# Patient Record
Sex: Female | Born: 1960 | Race: Black or African American | Hispanic: No | Marital: Single | Smoking: Current every day smoker
Health system: Southern US, Community
[De-identification: ages and names within clinical notes are randomized; demographics above are authoritative.]

## PROBLEM LIST (undated history)

## (undated) DIAGNOSIS — M1990 Unspecified osteoarthritis, unspecified site: Secondary | ICD-10-CM

## (undated) DIAGNOSIS — E119 Type 2 diabetes mellitus without complications: Secondary | ICD-10-CM

## (undated) DIAGNOSIS — M545 Low back pain, unspecified: Secondary | ICD-10-CM

## (undated) DIAGNOSIS — G709 Myoneural disorder, unspecified: Secondary | ICD-10-CM

## (undated) DIAGNOSIS — M51369 Other intervertebral disc degeneration, lumbar region without mention of lumbar back pain or lower extremity pain: Secondary | ICD-10-CM

## (undated) DIAGNOSIS — K589 Irritable bowel syndrome without diarrhea: Secondary | ICD-10-CM

## (undated) DIAGNOSIS — D649 Anemia, unspecified: Secondary | ICD-10-CM

## (undated) DIAGNOSIS — J45909 Unspecified asthma, uncomplicated: Secondary | ICD-10-CM

## (undated) DIAGNOSIS — K5190 Ulcerative colitis, unspecified, without complications: Secondary | ICD-10-CM

## (undated) DIAGNOSIS — H409 Unspecified glaucoma: Secondary | ICD-10-CM

## (undated) DIAGNOSIS — E785 Hyperlipidemia, unspecified: Secondary | ICD-10-CM

## (undated) DIAGNOSIS — K219 Gastro-esophageal reflux disease without esophagitis: Secondary | ICD-10-CM

## (undated) DIAGNOSIS — F1021 Alcohol dependence, in remission: Secondary | ICD-10-CM

## (undated) DIAGNOSIS — I10 Essential (primary) hypertension: Secondary | ICD-10-CM

## (undated) DIAGNOSIS — I499 Cardiac arrhythmia, unspecified: Secondary | ICD-10-CM

## (undated) DIAGNOSIS — J449 Chronic obstructive pulmonary disease, unspecified: Secondary | ICD-10-CM

## (undated) DIAGNOSIS — K760 Fatty (change of) liver, not elsewhere classified: Secondary | ICD-10-CM

## (undated) DIAGNOSIS — D219 Benign neoplasm of connective and other soft tissue, unspecified: Secondary | ICD-10-CM

## (undated) DIAGNOSIS — M5136 Other intervertebral disc degeneration, lumbar region: Secondary | ICD-10-CM

## (undated) DIAGNOSIS — N289 Disorder of kidney and ureter, unspecified: Secondary | ICD-10-CM

## (undated) HISTORY — PX: FRACTURE SURGERY: SHX138

---

## 2004-11-03 ENCOUNTER — Ambulatory Visit: Payer: Self-pay | Admitting: Family Medicine

## 2004-11-16 ENCOUNTER — Ambulatory Visit: Payer: Self-pay | Admitting: Family Medicine

## 2005-11-10 ENCOUNTER — Ambulatory Visit: Payer: Self-pay | Admitting: Family Medicine

## 2005-11-13 ENCOUNTER — Emergency Department: Payer: Self-pay | Admitting: General Practice

## 2006-01-04 HISTORY — PX: TOTAL KNEE ARTHROPLASTY: SHX125

## 2006-03-10 ENCOUNTER — Ambulatory Visit: Payer: Self-pay | Admitting: Physician Assistant

## 2006-04-12 ENCOUNTER — Ambulatory Visit: Payer: Self-pay | Admitting: Unknown Physician Specialty

## 2006-04-12 ENCOUNTER — Other Ambulatory Visit: Payer: Self-pay

## 2006-04-19 ENCOUNTER — Inpatient Hospital Stay: Payer: Self-pay | Admitting: Unknown Physician Specialty

## 2006-04-19 HISTORY — PX: JOINT REPLACEMENT: SHX530

## 2006-11-16 ENCOUNTER — Ambulatory Visit: Payer: Self-pay | Admitting: Family Medicine

## 2006-12-14 ENCOUNTER — Ambulatory Visit: Payer: Self-pay | Admitting: Family Medicine

## 2007-02-23 ENCOUNTER — Emergency Department: Payer: Self-pay | Admitting: Internal Medicine

## 2007-02-23 ENCOUNTER — Other Ambulatory Visit: Payer: Self-pay

## 2007-07-03 ENCOUNTER — Ambulatory Visit: Payer: Self-pay | Admitting: Family Medicine

## 2007-09-12 ENCOUNTER — Ambulatory Visit: Payer: Self-pay | Admitting: Family Medicine

## 2007-12-19 ENCOUNTER — Ambulatory Visit: Payer: Self-pay | Admitting: Certified Nurse Midwife

## 2008-01-12 ENCOUNTER — Emergency Department: Payer: Self-pay | Admitting: Emergency Medicine

## 2008-01-13 ENCOUNTER — Emergency Department: Payer: Self-pay | Admitting: Emergency Medicine

## 2008-07-25 ENCOUNTER — Ambulatory Visit: Payer: Self-pay | Admitting: Internal Medicine

## 2008-11-05 ENCOUNTER — Ambulatory Visit: Payer: Self-pay | Admitting: Internal Medicine

## 2009-02-12 ENCOUNTER — Ambulatory Visit: Payer: Self-pay | Admitting: Family Medicine

## 2009-02-13 ENCOUNTER — Encounter: Payer: Self-pay | Admitting: Unknown Physician Specialty

## 2009-03-04 ENCOUNTER — Encounter: Payer: Self-pay | Admitting: Unknown Physician Specialty

## 2010-01-06 ENCOUNTER — Ambulatory Visit: Payer: Self-pay | Admitting: Gastroenterology

## 2010-02-05 ENCOUNTER — Emergency Department: Payer: Self-pay | Admitting: Emergency Medicine

## 2010-02-12 ENCOUNTER — Ambulatory Visit: Payer: Self-pay | Admitting: Internal Medicine

## 2010-02-24 ENCOUNTER — Other Ambulatory Visit: Payer: Self-pay | Admitting: Internal Medicine

## 2010-02-24 ENCOUNTER — Ambulatory Visit: Payer: Self-pay | Admitting: Internal Medicine

## 2010-02-26 ENCOUNTER — Ambulatory Visit: Payer: Self-pay | Admitting: Internal Medicine

## 2011-03-29 ENCOUNTER — Ambulatory Visit: Payer: Self-pay | Admitting: Internal Medicine

## 2012-06-05 ENCOUNTER — Ambulatory Visit: Payer: Self-pay | Admitting: Internal Medicine

## 2012-06-19 ENCOUNTER — Ambulatory Visit: Payer: Self-pay | Admitting: Gastroenterology

## 2012-08-11 ENCOUNTER — Ambulatory Visit: Payer: Self-pay | Admitting: Gastroenterology

## 2012-08-11 LAB — CBC WITH DIFFERENTIAL/PLATELET
Basophil #: 0.1 10*3/uL (ref 0.0–0.1)
Eosinophil #: 0.1 10*3/uL (ref 0.0–0.7)
HGB: 11.7 g/dL — ABNORMAL LOW (ref 12.0–16.0)
Lymphocyte #: 2 10*3/uL (ref 1.0–3.6)
Lymphocyte %: 32.6 %
MCH: 28.5 pg (ref 26.0–34.0)
MCV: 84 fL (ref 80–100)
Monocyte %: 12 %
Neutrophil #: 3.1 10*3/uL (ref 1.4–6.5)
Neutrophil %: 52 %
RBC: 4.1 10*6/uL (ref 3.80–5.20)
WBC: 6 10*3/uL (ref 3.6–11.0)

## 2012-08-11 LAB — PROTIME-INR
INR: 1
Prothrombin Time: 13.5 secs (ref 11.5–14.7)

## 2012-08-14 ENCOUNTER — Ambulatory Visit: Payer: Self-pay | Admitting: Obstetrics and Gynecology

## 2012-08-14 LAB — BASIC METABOLIC PANEL
Anion Gap: 5 — ABNORMAL LOW (ref 7–16)
BUN: 15 mg/dL (ref 7–18)
Calcium, Total: 8.7 mg/dL (ref 8.5–10.1)
Chloride: 105 mmol/L (ref 98–107)
EGFR (African American): >60
EGFR (Non-African Amer.): >60
Osmolality: 280 (ref 275–301)
Sodium: 140 mmol/L (ref 136–145)

## 2012-09-20 ENCOUNTER — Other Ambulatory Visit: Payer: Self-pay | Admitting: Gastroenterology

## 2012-09-20 LAB — CBC WITH DIFFERENTIAL/PLATELET
Basophil #: 0.1 10*3/uL (ref 0.0–0.1)
Basophil %: 1.2 %
Eosinophil #: 0.1 10*3/uL (ref 0.0–0.7)
HCT: 35 % (ref 35.0–47.0)
HGB: 12 g/dL (ref 12.0–16.0)
Lymphocyte %: 42.4 %
MCH: 28 pg (ref 26.0–34.0)
MCHC: 34.2 g/dL (ref 32.0–36.0)
MCV: 82 fL (ref 80–100)
Monocyte #: 0.8 x10 3/mm (ref 0.2–0.9)
Monocyte %: 10.9 %
Neutrophil #: 3.4 10*3/uL (ref 1.4–6.5)
Platelet: 278 10*3/uL (ref 150–440)
RBC: 4.27 10*6/uL (ref 3.80–5.20)
RDW: 12.8 % (ref 11.5–14.5)
WBC: 7.8 10*3/uL (ref 3.6–11.0)

## 2012-09-20 LAB — COMPREHENSIVE METABOLIC PANEL
Alkaline Phosphatase: 76 U/L (ref 50–136)
Anion Gap: 8 (ref 7–16)
BUN: 15 mg/dL (ref 7–18)
Bilirubin,Total: 0.8 mg/dL (ref 0.2–1.0)
Chloride: 101 mmol/L (ref 98–107)
Co2: 32 mmol/L (ref 21–32)
Creatinine: 0.9 mg/dL (ref 0.60–1.30)
EGFR (Non-African Amer.): >60
Glucose: 112 mg/dL — ABNORMAL HIGH (ref 65–99)
Potassium: 2.8 mmol/L — ABNORMAL LOW (ref 3.5–5.1)
SGOT(AST): 33 U/L (ref 15–37)
SGPT (ALT): 22 U/L (ref 12–78)
Sodium: 141 mmol/L (ref 136–145)

## 2012-10-11 ENCOUNTER — Ambulatory Visit: Payer: Self-pay | Admitting: Obstetrics and Gynecology

## 2012-10-11 LAB — COMPREHENSIVE METABOLIC PANEL
Alkaline Phosphatase: 72 U/L (ref 50–136)
Anion Gap: 5 — ABNORMAL LOW (ref 7–16)
BUN: 11 mg/dL (ref 7–18)
Calcium, Total: 8.6 mg/dL (ref 8.5–10.1)
Chloride: 105 mmol/L (ref 98–107)
Creatinine: 0.78 mg/dL (ref 0.60–1.30)
EGFR (African American): >60
Glucose: 113 mg/dL — ABNORMAL HIGH (ref 65–99)
Osmolality: 274 (ref 275–301)
Potassium: 3.4 mmol/L — ABNORMAL LOW (ref 3.5–5.1)
SGOT(AST): 38 U/L — ABNORMAL HIGH (ref 15–37)
SGPT (ALT): 33 U/L (ref 12–78)
Sodium: 137 mmol/L (ref 136–145)

## 2012-10-11 LAB — HEMOGLOBIN: HGB: 12.3 g/dL (ref 12.0–16.0)

## 2012-10-26 ENCOUNTER — Ambulatory Visit: Payer: Self-pay | Admitting: Obstetrics and Gynecology

## 2012-10-27 LAB — PATHOLOGY REPORT

## 2012-12-27 ENCOUNTER — Other Ambulatory Visit: Payer: Self-pay | Admitting: Gastroenterology

## 2012-12-27 LAB — CLOSTRIDIUM DIFFICILE(ARMC)

## 2013-06-26 ENCOUNTER — Ambulatory Visit: Payer: Self-pay | Admitting: Internal Medicine

## 2013-11-11 ENCOUNTER — Inpatient Hospital Stay: Payer: Self-pay | Admitting: Internal Medicine

## 2013-11-11 LAB — TROPONIN I
TROPONIN-I: 0.29 ng/mL — AB
Troponin-I: 0.29 ng/mL — ABNORMAL HIGH
Troponin-I: 0.22 ng/mL — ABNORMAL HIGH

## 2013-11-11 LAB — CK TOTAL AND CKMB (NOT AT ARMC)
CK, Total: 358 U/L — ABNORMAL HIGH
CK, Total: 354 U/L — ABNORMAL HIGH
CK-MB: 0.9 ng/mL (ref 0.5–3.6)
CK-MB: 0.6 ng/mL (ref 0.5–3.6)

## 2013-11-11 LAB — CBC
HCT: 31.7 % — ABNORMAL LOW (ref 35.0–47.0)
HGB: 10.5 g/dL — ABNORMAL LOW (ref 12.0–16.0)
MCH: 28.7 pg (ref 26.0–34.0)
MCHC: 33.2 g/dL (ref 32.0–36.0)
MCV: 86 fL (ref 80–100)
Platelet: 465 10*3/uL — ABNORMAL HIGH (ref 150–440)
RBC: 3.67 10*6/uL — ABNORMAL LOW (ref 3.80–5.20)
RDW: 15.9 % — ABNORMAL HIGH (ref 11.5–14.5)
WBC: 12.1 10*3/uL — ABNORMAL HIGH (ref 3.6–11.0)

## 2013-11-11 LAB — PROTIME-INR
INR: 0.9
Prothrombin Time: 12.4 secs (ref 11.5–14.7)

## 2013-11-11 LAB — BASIC METABOLIC PANEL
Anion Gap: 17 — ABNORMAL HIGH (ref 7–16)
BUN: 33 mg/dL — AB (ref 7–18)
CALCIUM: 6.1 mg/dL — AB (ref 8.5–10.1)
Chloride: 83 mmol/L — ABNORMAL LOW (ref 98–107)
Co2: 33 mmol/L — ABNORMAL HIGH (ref 21–32)
Creatinine: 1.7 mg/dL — ABNORMAL HIGH (ref 0.60–1.30)
EGFR (Non-African Amer.): 34 — ABNORMAL LOW
GFR CALC AF AMER: 41 — AB
Glucose: 82 mg/dL (ref 65–99)
Osmolality: 273 (ref 275–301)
POTASSIUM: 1.9 mmol/L — AB (ref 3.5–5.1)
SODIUM: 133 mmol/L — AB (ref 136–145)

## 2013-11-11 LAB — APTT: ACTIVATED PTT: 31.9 s (ref 23.6–35.9)

## 2013-11-11 LAB — PRO B NATRIURETIC PEPTIDE: B-TYPE NATIURETIC PEPTID: 163 pg/mL — AB (ref 0–125)

## 2013-11-11 LAB — HEPARIN LEVEL (UNFRACTIONATED): Anti-Xa(Unfractionated): <0.1 IU/mL — ABNORMAL LOW (ref 0.30–0.70)

## 2013-11-11 LAB — SALICYLATE LEVEL: Salicylates, Serum: <1.7 mg/dL

## 2013-11-11 LAB — ETHANOL: Ethanol: <3 mg/dL

## 2013-11-11 LAB — CK-MB: CK-MB: 1.4 ng/mL (ref 0.5–3.6)

## 2013-11-12 LAB — BASIC METABOLIC PANEL
ANION GAP: 10 (ref 7–16)
BUN: 25 mg/dL — ABNORMAL HIGH (ref 7–18)
CO2: 24 mmol/L (ref 21–32)
CREATININE: 1 mg/dL (ref 0.60–1.30)
Calcium, Total: 6.1 mg/dL — CL (ref 8.5–10.1)
Chloride: 113 mmol/L — ABNORMAL HIGH (ref 98–107)
EGFR (African American): >60
EGFR (Non-African Amer.): >60
GLUCOSE: 147 mg/dL — AB (ref 65–99)
Osmolality: 300 (ref 275–301)
Potassium: 3.9 mmol/L (ref 3.5–5.1)
Sodium: 147 mmol/L — ABNORMAL HIGH (ref 136–145)

## 2013-11-12 LAB — CBC WITH DIFFERENTIAL/PLATELET
BASOS PCT: 1.1 %
Basophil #: 0.1 10*3/uL (ref 0.0–0.1)
EOS ABS: 0.2 10*3/uL (ref 0.0–0.7)
EOS PCT: 2.4 %
HCT: 27.5 % — ABNORMAL LOW (ref 35.0–47.0)
HGB: 9 g/dL — ABNORMAL LOW (ref 12.0–16.0)
LYMPHS ABS: 2.5 10*3/uL (ref 1.0–3.6)
LYMPHS PCT: 28.2 %
MCH: 29 pg (ref 26.0–34.0)
MCHC: 32.7 g/dL (ref 32.0–36.0)
MCV: 89 fL (ref 80–100)
Monocyte #: 1 x10 3/mm — ABNORMAL HIGH (ref 0.2–0.9)
Monocyte %: 11.7 %
NEUTROS PCT: 56.6 %
Neutrophil #: 5 10*3/uL (ref 1.4–6.5)
Platelet: 498 10*3/uL — ABNORMAL HIGH (ref 150–440)
RBC: 3.1 10*6/uL — ABNORMAL LOW (ref 3.80–5.20)
RDW: 15.6 % — ABNORMAL HIGH (ref 11.5–14.5)
WBC: 8.9 10*3/uL (ref 3.6–11.0)

## 2013-11-12 LAB — HEPARIN LEVEL (UNFRACTIONATED): Anti-Xa(Unfractionated): <0.1 IU/mL — ABNORMAL LOW (ref 0.30–0.70)

## 2014-04-26 NOTE — Op Note (Signed)
PATIENT NAME:  Sheila [name], Sheila [name] MR#:  [id number] DATE OF BIRTH:  11/09/1960  DATE OF PROCEDURE:  10/26/2012  PREOPERATIVE DIAGNOSIS:  Postmenopausal bleeding.  POSTOPERATIVE DIAGNOSIS:  Postmenopausal bleeding.  OPERATION PERFORMED:  Hysteroscopy, dilatation and curettage.   ANESTHESIA:  General via LMA airway.  SURGEON:  Stoney Bang. Georgianne Fick, MD   ESTIMATED BLOOD LOSS:  Minimal.  URINE OUTPUT: 10 mL straight catheterization prior to the beginning of the case.  COMPLICATIONS: None.   FINDINGS:  Normal cervix, atrophic, small uterine cavity, atrophic appearing endometrial lining. A minimal amount of tissue was yielded on D and C. Both tubal ostia were visualized.  SPECIMENS REMOVED: Endometrial curettings.  CONDITION FOLLOWING PROCEDURE: Stable.  PROCEDURE IN DETAIL:  Risks, benefits, and alternatives of the procedure were discussed with the patient prior to proceeding to the Operating Room.  The patient was taken to the Operating Room, where she was administered general anesthesia via an LMA airway. She was positioned in the dorsal lithotomy position, using Allen stirrups, prepped and draped in the usual sterile fashion. A timeout was performed.   Attention was turned to the patient's pelvis. The bladder was straight catheterized for 10 mL of clear urine. An operative speculum was then placed. The anterior lip of the cervix was visualized and grasped with a single-tooth tenaculum. The cervix was sequentially dilated using Hegar dilators. Hysteroscopy revealed a normal appearing, small, atrophic uterine cavity with atrophic-appearing uterine endometrial lining. There was no defect noted in the lining or other grossly evident pathologic changes. Following hysteroscopy, a sharp curettage was performed, yielding minimal amount of tissue. Following the hysteroscopy, single-tooth tenaculum was removed.  Tenaculum sites were noted to be hemostatic. The operative speculum was removed.  Sponge and  instrument counts were correct x2. The patient tolerated the procedure well, and was taken to the recovery room in stable condition.    ____________________________ Stoney Bang. Georgianne Fick, MD ams:cg D: 10/29/2012 22:50:00 ET T: 10/30/2012 03:11:23 ET JOB#: [id number]  cc: Stoney Bang. Georgianne Fick, MD, <Dictator> Conan Bowens Madelon Lips MD ELECTRONICALLY SIGNED 11/15/2012 8:54

## 2014-04-27 NOTE — Discharge Summary (Signed)
PATIENT NAME:  Sheila [name], WAREING MR#:  [id number] DATE OF BIRTH:  1960-11-16  DATE OF ADMISSION:  11/11/2013 DATE OF DISCHARGE:  11/12/2013  ADMITTING DIAGNOSIS: Chest pain.   DISCHARGE DIAGNOSES:  1.  Chest pain, noncardiac in origin, status post cardiac catheterization which just showed  minimal coronary artery disease. No evidence of obstruction.  2.  Acute kidney injury, possibly due to hydrochlorothiazide therapy, now renal function normal.  3.  Severe hypokalemia, status post replacement.  4.  Hyponatremia, likely due to diuretic therapy, now sodium normal.  5.  Diabetes type 2.  6.  Gastroesophageal reflux disease.   7.  Glaucoma.  8.  Hypertension.  9.  Anemia--needs further workup as an outpatient.   CONSULTANT: Dionisio David, MD   PERTINENT LABORATORIES AND EVALUATIONS: Admitting glucose 82, BUN 33, creatinine 1.70, sodium 133, potassium was 1.9, chloride 83, CO2 was 33, calcium 6.1. Discharge creatinine is 1.0, sodium was 147. Admitting WBC count 12.1, hemoglobin 10.5, platelet count was 465,000. Discharge WBC count was 8.9. Echocardiogram of the heart showed EF 40% to 50%, left atrium normal size and structure, mild to moderate mitral valve regurgitation, mild tricuspid regurgitation. Cardiac catheterization showed global LVH function was normal. Mild disease of LAD. Proximal RCA and left circumflex were normal.   HOSPITAL COURSE: Please refer to H and P done by the admitting physician. The patient is a 54 year old African American female who presented to the hospital with complaint of having chest pain. The patient was evaluated in the ED and was noted to have significant electrolyte abnormalities and EKG changes, and troponin that was slightly elevated. Due to these symptoms and significant electrolyte abnormality, she was admitted to the hospital. Initially, there was concern for a non-ST MI. The patient was kept on a heparin drip. A cardiology consult was obtained. She  underwent a cardiac catheter the next day which showed about 30% disease as described above. The patient was not having any chest pain. In terms of her electrolyte imbalances, they were felt to be likely related to her diuretic therapy which has been discontinued. The patient was aggressively hydrated. Electrolytes were replaced. Now, she is feeling much better and is stable for discharge.   DISCHARGE MEDICATIONS: Metoprolol succinate 12.5 mg p.o. daily, Proventil 2 puffs q. 4 hours p.r.n., fluticasone 2 sprays daily, metformin 500 mg 1 tab p.o. daily, pravastatin 10 mg at bedtime, promethazine 25 mg q. 6 hours p.r.n. for nausea, guaifenesin 200 mg 1 tablet 4 times a day, omeprazole 40 mg 1 tab p.o. daily, Cozaar 100 mg daily.   DIET: Low-sodium, low-fat, low-cholesterol, carbohydrate-controlled diet.   ACTIVITY: As tolerated.   FOLLOWUP: With primary MD in 1 to 2 weeks.   Time Spent On This Discharge: 35 minutes.      ____________________________ Lafonda Mosses Posey Pronto, MD shp:ts D: 11/14/2013 08:08:19 ET T: 11/14/2013 10:47:00 ET JOB#: [id number]  cc: Jamee Keach H. Posey Pronto, MD, <Dictator> Alric Seton MD ELECTRONICALLY SIGNED 11/21/2013 19:29

## 2014-04-27 NOTE — H&P (Signed)
PATIENT NAME:  Sheila [name], FORGE MR#:  [id number] DATE OF BIRTH:  1960-01-11  DATE OF ADMISSION:  11/11/2013  REFERRING PHYSICIAN: Loney Hering, M.D.   PRIMARY CARE PHYSICIAN: Ellamae Sia, M.D.   ADMISSION DIAGNOSIS: Non-ST-elevation myocardial infarction.   HISTORY OF PRESENT ILLNESS: This is a 54 year old, African American female, who presents to the Emergency Department complaining of chest pain. The patient states that she had 1 episode of nonbloody, nonbilious emesis after she lied down go to bed tonight. This was followed by diaphoresis and bilateral upper extremity numbness. This comes on the heels of 6 days of flulike symptoms including cough and subjective fevers. The patient went to her primary care doctor and received ciprofloxacin and guaifenesin for gastroenteritis and cough. She states that she has been eating and drinking, albeit slightly less than usual due to poor appetite, but that she denies any other symptoms.   Laboratory evaluation in the Emergency Department revealed elevated troponins, as well as T wave inversions on her EKG which prompted them to call for admission.   REVIEW OF SYSTEMS:  CONSTITUTIONAL: The patient admits to subjective fevers as well as fatigue, but denies weakness.  EYES: Denies inflammation or blurred vision.  ENT: Denies tinnitus or difficulty swallowing.  RESPIRATORY: Admits to cough, but denies shortness of breath.  CARDIOVASCULAR: Admits to chest tightness, but denies orthopnea or paroxysmal nocturnal dyspnea.  GASTROINTESTINAL: Admits to nausea and vomiting, but denies diarrhea or abdominal pain.  GENITOURINARY: Denies dysuria, increased frequency or hesitancy.  ENDOCRINE: Denies polyuria or polydipsia.  HEMATOLOGIC AND LYMPHATIC: Denies easy bruising or bleeding.  INTEGUMENT: Denies rashes or lesions.  MUSCULOSKELETAL: Denies myalgias or arthralgias.   NEUROLOGIC: Denies numbness in her extremities, at this time, or dysarthria.   PSYCHIATRIC: Denies depression or suicidal ideation.   PAST MEDICAL HISTORY: Hypertension, diabetes type 2, glaucoma, gastroesophageal reflux disease.   PAST SURGICAL HISTORY: Total knee replacement on the right.   FAMILY HISTORY: Diabetes and hypertension in both of her parents. She has a son who has  [place] syndrome.   SOCIAL HISTORY: She lives with her husband. She denies smoking, alcohol or drugs.   MEDICATIONS:  1. Ciprofloxacin 500 mg 1 tablet p.o. every 12 hours x 7 days.  2. Cozaar 25 mg 1 tablet p.o. every morning.  3. Fluticasone nasal spray 50 mcg 2 sprays to each nostril once a day.  4. Guaifenesin 200 mg 1 tablet p.o. 4 times a day as needed.  5. Maxzide 25 mg/37.5 mg 1 tablet p.o. daily.  6. Metformin 500 mg 1 tablet p.o. daily.  7. Metoprolol succinate 25 mg extended release 1/2 tablet p.o. every morning.  8. Nexium 40 mg delayed release capsule 1 tablet p.o. daily.  9. Pravastatin 10 mg 1 tablet p.o. at bedtime.  10. Promethazine 25 mg 1 tablet p.o. every 6 hours as needed for nausea and vomiting.  11. Proventil HFA 90 mcg inhaler 2 puffs inhaled every 4 hours as needed.  12. Ranitidine 300 mg 1 tablet p.o. at bedtime.   ALLERGIES: AMOXICILLIN, AMPICILLIN, PENICILLIN.   PERTINENT LABORATORY RESULTS AND RADIOGRAPHIC FINDINGS: Serum glucose 82, BUN 33, creatinine 1.70, sodium 133, potassium 1.9, chloride 83, bicarbonate 33, anion gap is 17, calcium 6.1 ethanol level is less than 3. Troponin 0.29. White blood cell count 12.1, hemoglobin 10.5, hematocrit 31.7, platelet count is 465,000. INR 0.9, salicylate level is less than 1.7.   Venous blood gas shows a pH of 7.5 and lactic acid of 0.9, pCO2  of 47.   Chest x-ray shows no active cardiopulmonary disease.   PHYSICAL EXAMINATION:  VITAL SIGNS: Temperature is 97.5, pulse 79, respirations 18, blood pressure 97/77, pulse oximetry 100% on room air.  GENERAL: The patient is alert and oriented x 3 in no apparent distress.   HEENT: Normocephalic, atraumatic. Pupils equal, round, and reactive to light and accommodation. Extraocular movements are intact. Mucous membranes are mildly tacky.  NECK: Trachea is midline. No adenopathy.  CHEST: Symmetric and atraumatic.  CARDIOVASCULAR: Regular rate and rhythm. Normal S1, S2. No rubs, clicks, or murmurs appreciated.  LUNGS: Clear to auscultation bilaterally. Normal effort and excursion.  ABDOMEN: Positive bowel sounds, nontender, somewhat protuberant. No hepatosplenomegaly.  GENITOURINARY: Deferred.  MUSCULOSKELETAL: The patient moves all 4 extremities equally. Strength is 5/5 in upper and lower extremities bilaterally.  SKIN: No rashes or lesions.  EXTREMITIES: No clubbing, cyanosis or edema.  NEUROLOGIC: Cranial nerves II through XII are grossly intact.  PSYCHIATRIC: Mood is normal. Affect is congruent.   ASSESSMENT AND PLAN: This is a 54 year old female with a non-ST-elevation myocardial infarction.   1. Non-ST-elevation myocardial infarction. The patient has an elevated troponin and inverted T waves in leads III, aVF and V3. Heparin drip has been initiated in the Emergency Department. We will continue to cycle enzymes and a cardiology consult has been ordered. The patient is no longer in pain.  2. Hypertension is controlled for now. We will hold the patient's angiotensin receptor blocker, as well as her diuretic tablet for now as she has acute kidney injury and hypokalemia.  3. Acute kidney injury, likely secondary to dehydration due to decreased appetite and/or poor p.o. intake while she was ill. We will give the patient intravenous fluid and repeat her electrolytes tomorrow.  4. Hypokalemia. I have given the patient potassium replacement in bolus form, p.o. replacement as well as in her maintenance fluid. We will continue to keep the patient on telemetry.  5. Hyponatremia. Will likely resolve with aggressive hydration.  6. Diabetes type 2. Sliding scale insulin while  the patient is hospitalized. I have also held her metformin at this time.  7. Gastroesophageal reflux disease. We will give the patient an H2-blocker as needed.  8. Glaucoma. The patient does not have her eyedrops with her. Her husband should retrieve these from home.  9. Deep vein thrombosis prophylaxis. The patient is currently on a treatment dose of heparin and she has sequential compression devices ordered.  10. Gastrointestinal prophylaxis as above.   CODES STATUS: The patient is a Full Code.   TIME SPENT ON ADMISSION ORDERS AND PATIENT CARE: Approximately 35 minutes.    ____________________________ Norva Riffle. Marcille Blanco, MD msd:JT D: 11/11/2013 08:08:49 ET T: 11/11/2013 08:57:52 ET JOB#: [id number]  cc: Norva Riffle. Marcille Blanco, MD, <Dictator> Norva Riffle Ronika Kelson MD ELECTRONICALLY SIGNED 11/12/2013 0:15

## 2014-04-27 NOTE — Consult Note (Signed)
PATIENT NAME:  Sheila [name], BASSO MR#:  [id number] DATE OF BIRTH:  1960/07/09  DATE OF CONSULTATION:  11/11/2013  REFERRING PHYSICIAN:   CONSULTING PHYSICIAN:  Kelby Fam. Dorena Cookey, PA-C  Dictating for Dionisio David, MD.   INDICATION FOR CONSULT: Chest pain and elevated troponin.  HISTORY OF PRESENT ILLNESS: This is a 54 year old African American female who presented to the Emergency Room at about 2:00 a.m. last night after chest pain started at about midnight. She describes the chest pain as a tightness in her chest, constant, 8/10 in severity, with concurrent nausea and vomiting and bilateral upper extremity paresthesias. She denies shortness of breath or diaphoresis. She was found to have nonspecific EKG changes and elevated troponins in the ER. Thus, cardiology was consulted after admission. Currently, she states the chest pain is still present but has resolved.    PAST MEDICAL HISTORY: Hypertension, diabetes, GERD.   PAST SURGICAL HISTORY: Right TKR.   FAMILY HISTORY: No coronary artery disease per the patient.   SOCIAL HISTORY: She admits to EtOH abuse for years. No tobacco or other drug use.   REVIEW OF SYSTEMS:  CONSTITUTIONAL: She admits to fatigue and malaise.  RESPIRATORY: Complains of a cough but no shortness of breath.  CARDIOVASCULAR: Chest tightness is still present but has improved to 6/10 in severity. No palpitations or orthopnea.  GASTROINTESTINAL: No abdominal pain. Nausea and vomiting have resolved.   ALLERGIES: PENICILLIN.   PHYSICAL EXAMINATION:  VITAL SIGNS: Temperature 98, pulse 79, respirations 18, blood pressure 115/78, 95% saturation on room air.  GENERAL: A and O x 3, pleasant, in no acute distress, accompanied by family members. HEENT:  No JVD or carotid bruit.  CARDIOVASCULAR: Normal S1, S2. No audible murmurs.  LUNGS: Clear to auscultation bilaterally. No wheezes, rhonchi or rales.  ABDOMEN: Positive bowel sounds. Nontender.  EXTREMITIES: No pedal edema.    LABORATORY DATA: Troponin 0.29 x2. WBC 12.1, creatinine of 1.7.   ASSESSMENT AND PLAN:  Non-ST elevation myocardial infarction. We will obtain an echocardiogram to evaluate wall motion. Plan for cardiac catheterization tomorrow with renal precautions. Continue heparin. Continue metoprolol, nitroglycerin and statin.   Thank you very much for this consultation.    ____________________________ Kelby Fam Dorena Cookey, PA-C ear:JT D: 11/11/2013 12:49:56 ET T: 11/11/2013 13:26:01 ET JOB#: [id number]  cc: Dyann Ruddle A. [place], <Dictator> Kelby Fam Adalina Dopson PA ELECTRONICALLY SIGNED 11/14/2013 15:31

## 2014-05-05 DIAGNOSIS — F1021 Alcohol dependence, in remission: Secondary | ICD-10-CM

## 2014-05-05 HISTORY — DX: Alcohol dependence, in remission: F10.21

## 2014-05-28 ENCOUNTER — Other Ambulatory Visit: Payer: Self-pay | Admitting: Gastroenterology

## 2014-05-28 DIAGNOSIS — R1084 Generalized abdominal pain: Secondary | ICD-10-CM

## 2014-05-30 ENCOUNTER — Ambulatory Visit
Admission: RE | Admit: 2014-05-30 | Discharge: 2014-05-30 | Disposition: A | Discharge status: Home / Self Care | Payer: Medicaid Other | Source: Ambulatory Visit | Attending: Gastroenterology | Admitting: Gastroenterology

## 2014-05-30 DIAGNOSIS — R1084 Generalized abdominal pain: Secondary | ICD-10-CM | POA: Diagnosis present

## 2014-11-22 ENCOUNTER — Emergency Department: Payer: Medicaid Other

## 2014-11-22 ENCOUNTER — Encounter: Payer: Self-pay | Admitting: Emergency Medicine

## 2014-11-22 ENCOUNTER — Emergency Department
Admission: EM | Admit: 2014-11-22 | Discharge: 2014-11-22 | Disposition: A | Discharge status: Home / Self Care | Payer: Medicaid Other | Attending: Emergency Medicine | Admitting: Emergency Medicine

## 2014-11-22 DIAGNOSIS — Z880 Allergy status to penicillin: Secondary | ICD-10-CM | POA: Diagnosis not present

## 2014-11-22 DIAGNOSIS — I10 Essential (primary) hypertension: Secondary | ICD-10-CM | POA: Diagnosis not present

## 2014-11-22 DIAGNOSIS — Z87891 Personal history of nicotine dependence: Secondary | ICD-10-CM | POA: Diagnosis not present

## 2014-11-22 DIAGNOSIS — E119 Type 2 diabetes mellitus without complications: Secondary | ICD-10-CM | POA: Diagnosis not present

## 2014-11-22 DIAGNOSIS — Z79899 Other long term (current) drug therapy: Secondary | ICD-10-CM | POA: Insufficient documentation

## 2014-11-22 DIAGNOSIS — Z7951 Long term (current) use of inhaled steroids: Secondary | ICD-10-CM | POA: Insufficient documentation

## 2014-11-22 DIAGNOSIS — Z7984 Long term (current) use of oral hypoglycemic drugs: Secondary | ICD-10-CM | POA: Diagnosis not present

## 2014-11-22 DIAGNOSIS — M1711 Unilateral primary osteoarthritis, right knee: Secondary | ICD-10-CM | POA: Diagnosis not present

## 2014-11-22 DIAGNOSIS — M25561 Pain in right knee: Secondary | ICD-10-CM | POA: Diagnosis present

## 2014-11-22 HISTORY — DX: Chronic obstructive pulmonary disease, unspecified: J44.9

## 2014-11-22 HISTORY — DX: Hyperlipidemia, unspecified: E78.5

## 2014-11-22 HISTORY — DX: Disorder of kidney and ureter, unspecified: N28.9

## 2014-11-22 HISTORY — DX: Unspecified glaucoma: H40.9

## 2014-11-22 HISTORY — DX: Unspecified osteoarthritis, unspecified site: M19.90

## 2014-11-22 HISTORY — DX: Alcohol dependence, in remission: F10.21

## 2014-11-22 HISTORY — DX: Type 2 diabetes mellitus without complications: E11.9

## 2014-11-22 HISTORY — DX: Ulcerative colitis, unspecified, without complications: K51.90

## 2014-11-22 HISTORY — DX: Essential (primary) hypertension: I10

## 2014-11-22 HISTORY — DX: Anemia, unspecified: D64.9

## 2014-11-22 HISTORY — DX: Gastro-esophageal reflux disease without esophagitis: K21.9

## 2014-11-22 HISTORY — DX: Fatty (change of) liver, not elsewhere classified: K76.0

## 2014-11-22 MED ORDER — OXYCODONE HCL 5 MG PO CAPS: 5.0000 mg | ORAL_CAPSULE | ORAL | Status: DC | PRN

## 2014-11-22 MED ORDER — KETOROLAC TROMETHAMINE 60 MG/2ML IM SOLN
60.0000 mg | Freq: Once | INTRAMUSCULAR | Status: AC
  Administered 2014-11-22: 60 mg via INTRAMUSCULAR
  Filled 2014-11-22: qty 2

## 2014-11-22 MED ORDER — HYDROCODONE-ACETAMINOPHEN 5-325 MG PO TABS
2.0000 | ORAL_TABLET | Freq: Once | ORAL | Status: AC
  Administered 2014-11-22: 2 via ORAL
  Filled 2014-11-22: qty 2

## 2014-11-22 NOTE — ED Provider Notes (Signed)
[facility] Emergency Department Provider Note  ____________________________________________  Time seen: Approximately 8:11 AM  I have reviewed the triage vital signs and the nursing notes.   HISTORY  Chief Complaint Knee Pain and Back Pain   HPI Sheila [name] is a 54 y.o. female presents with a right knee pain and swelling for 1-2 weeks. Patient denies any trauma. Patient states that she currently sees orthopedics for her low back pain. Diagnosed originally with arthritis but the pain continues to worsen. Describes the pain as a 10 over 10.   Past Medical History  Diagnosis Date  . Hypertension   . Renal disorder   . Arthritis   . Anemia   . Diabetes mellitus without complication ([place])     on metformin  . 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[place]) 40/9811  . Fatty liver   . Ulcerative colitis ([place])     on mesalamine  . COPD (chronic obstructive pulmonary disease) ([place])   . GERD (gastroesophageal reflux disease)   . Hyperlipidemia   . Glaucoma     There are no active problems to display for this patient.   Past Surgical History  Procedure Laterality Date  . Total knee arthroplasty Left 2008    Current Outpatient Rx  Name  Route  Sig  Dispense  Refill  . albuterol (PROVENTIL HFA;VENTOLIN HFA) 108 (90 BASE) MCG/ACT inhaler   Inhalation   Inhale 2 puffs into the lungs every 4 (four) hours as needed for wheezing or shortness of breath.         . brinzolamide (AZOPT) 1 % ophthalmic suspension   Both Eyes   Place 1 drop into both eyes 3 (three) times daily.         . chlorthalidone (HYGROTON) 25 MG tablet   Oral   Take 25 mg by mouth daily.         Marland Kitchen esomeprazole (NEXIUM) 40 MG capsule   Oral   Take 40 mg by mouth daily at 12 noon.         . fluticasone (VERAMYST) 27.5 MCG/SPRAY nasal spray   Nasal   Place 2 sprays into the nose daily.         Marland Kitchen ibuprofen (ADVIL,MOTRIN) 800 MG tablet   Oral   Take 800 mg by mouth every 8 (eight) hours  as needed.         Marland Kitchen losartan (COZAAR) 25 MG tablet   Oral   Take 25 mg by mouth daily.         . mesalamine (APRISO) 0.375 G 24 hr capsule   Oral   Take 375 mg by mouth daily.         . mesalamine (PENTASA) 500 MG CR capsule   Oral   Take 500 mg by mouth 4 (four) times daily.         . metFORMIN (GLUCOPHAGE-XR) 500 MG 24 hr tablet   Oral   Take 500 mg by mouth daily with breakfast.         . metoprolol tartrate (LOPRESSOR) 12.5 mg TABS tablet   Oral   Take 12.5 mg by mouth 2 (two) times daily.         . Potassium Chloride Crys CR (KLOR-CON M20 PO)   Oral   Take by mouth daily.         . pravastatin (PRAVACHOL) 10 MG tablet   Oral   Take 10 mg by mouth at bedtime.         Marland Kitchen  ranitidine (ZANTAC) 150 MG tablet   Oral   Take 150 mg by mouth at bedtime.         Marland Kitchen oxycodone (OXY-IR) 5 MG capsule   Oral   Take 1 capsule (5 mg total) by mouth every 4 (four) hours as needed.   30 capsule   0     Allergies Ampicillin; Erythromycin; Penicillins; and Omeprazole  History reviewed. No pertinent family history.  Social History Social History  Substance Use Topics  . Smoking status: Former Smoker    Quit date: 01/04/1978  . Smokeless tobacco: None  . Alcohol Use: No     Comment: Recovering alcoholic since 26/3335    Review of Systems Constitutional: No fever/chills Eyes: No visual changes. ENT: No sore throat. Cardiovascular: Denies chest pain. Respiratory: Denies shortness of breath. Gastrointestinal: No abdominal pain.  No nausea, no vomiting.  No diarrhea.  No constipation. Genitourinary: Negative for dysuria. Musculoskeletal: Positive for right knee pain and swelling. Skin: Negative for rash. Neurological: Negative for headaches, focal weakness or numbness.  10-point ROS otherwise negative.  ____________________________________________   PHYSICAL EXAM: BP 111/65 mmHg  Pulse 88  Temp(Src) 98.6 F (37 C) (Oral)  Resp 16  Ht 5' (1.524  m)  Wt 175 lb (79.379 kg)  BMI 34.18 kg/m2  SpO2 95%  VITAL SIGNS: ED Triage Vitals  Enc Vitals Group     BP --      Pulse --      Resp --      Temp --      Temp src --      SpO2 --      Weight --      Height --      Head Cir --      Peak Flow --      Pain Score --      Pain Loc --      Pain Edu? --      Excl. in [place]? --     Constitutional: Alert and oriented. Well appearing and in no acute distress.  Cardiovascular: Normal rate, regular rhythm. Grossly normal heart sounds.  Good peripheral circulation. Respiratory: Normal respiratory effort.  No retractions. Lungs CTAB. Musculoskeletal: Right knee with positive edema and tenderness for range of motion with increased pain with extension..  No joint effusions. No warmth noted. Neurologic:  Normal speech and language. No gross focal neurologic deficits are appreciated. No gait instability. Skin:  Skin is warm, dry and intact. No rash noted. Psychiatric: Mood and affect are normal. Speech and behavior are normal.  ____________________________________________   LABS (all labs ordered are listed, but only abnormal results are displayed)  Labs Reviewed - No data to display ____________________________________________   RADIOLOGY  There is marked narrowing of the medial joint space compartment. Marginal osteophytes are noted from all 3 compartments. There is mild medial joint space compartment subchondral sclerosis in the few small areas of calcification or ossification medial to the medial joint compartment. A small to moderate joint effusion is present.  Soft tissues are otherwise unremarkable.  IMPRESSION: 1. No fracture. 2. Advanced osteoarthritis. Small to moderate joint effusion.  ____________________________________________   PROCEDURES  Procedure(s) performed: None  Critical Care performed: No  ____________________________________________   INITIAL IMPRESSION / ASSESSMENT AND PLAN / ED  COURSE  Pertinent labs & imaging results that were available during my care of the patient were reviewed by me and considered in my medical decision making (see chart for details).  Advanced osteoarthritis with  joint effusion to the right knee. Continue to use of ibuprofen 800 mg at home. Rx given for oxycodone 5 mg patient to follow-up with orthopedics as scheduled on December 1. She voices no other emergency medical complaints at this time and will be discharged home. ____________________________________________   FINAL CLINICAL IMPRESSION(S) / ED DIAGNOSES  Final diagnoses:  Osteoarthritis of right knee, unspecified osteoarthritis type      Arlyss Repress, PA-C 11/22/14 0920  Schuyler Amor, MD 11/22/14 1309

## 2014-11-22 NOTE — ED Notes (Signed)
Patient transported to X-ray 

## 2014-11-22 NOTE — ED Notes (Signed)
65 yof presents for right knee pain and swelling x 1-2 weeks. No trauma. Also says she is seeing Dr. Dorise Hiss for her low back pain. Was dx with arthritis but pain  continues to worsen. No urinary/stool incontinence.

## 2014-11-22 NOTE — Discharge Instructions (Signed)
Heat Therapy Heat therapy can help ease sore, stiff, injured, and tight muscles and joints. Heat relaxes your muscles, which may help ease your pain. Heat therapy should only be used on old, pre-existing, or long-lasting (chronic) injuries. Do not use heat therapy unless told by your doctor. HOW TO USE HEAT THERAPY There are several different kinds of heat therapy, including:  Moist heat pack.  Warm water bath.  Hot water bottle.  Electric heating pad.  Heated gel pack.  Heated wrap.  Electric heating pad. GENERAL HEAT THERAPY RECOMMENDATIONS   Do not sleep while using heat therapy. Only use heat therapy while you are awake.  Your skin may turn pink while using heat therapy. Do not use heat therapy if your skin turns red.  Do not use heat therapy if you have new pain.  High heat or long exposure to heat can cause burns. Be careful when using heat therapy to avoid burning your skin.  Do not use heat therapy on areas of your skin that are already irritated, such as with a rash or sunburn. GET HELP IF:   You have blisters, redness, swelling (puffiness), or numbness.  You have new pain.  Your pain is worse. MAKE SURE YOU:  Understand these instructions.  Will watch your condition.  Will get help right away if you are not doing well or get worse.   This information is not intended to replace advice given to you by your health care provider. Make sure you discuss any questions you have with your health care provider.   Document Released: 03/15/2011 Document Revised: 01/11/2014 Document Reviewed: 02/13/2013 Elsevier Interactive Patient Education 2016 Elsevier Inc.  Osteoarthritis Osteoarthritis is a disease that causes soreness and inflammation of a joint. It occurs when the cartilage at the affected joint wears down. Cartilage acts as a cushion, covering the ends of bones where they meet to form a joint. Osteoarthritis is the most common form of arthritis. It often occurs  in older people. The joints affected most often by this condition include those in the:  Ends of the fingers.  Thumbs.  Neck.  Lower back.  Knees.  Hips. CAUSES  Over time, the cartilage that covers the ends of bones begins to wear away. This causes bone to rub on bone, producing pain and stiffness in the affected joints.  RISK FACTORS Certain factors can increase your chances of having osteoarthritis, including:  Older age.  Excessive body weight.  Overuse of joints.  Previous joint injury. SIGNS AND SYMPTOMS   Pain, swelling, and stiffness in the joint.  Over time, the joint may lose its normal shape.  Small deposits of bone (osteophytes) may grow on the edges of the joint.  Bits of bone or cartilage can break off and float inside the joint space. This may cause more pain and damage. DIAGNOSIS  Your health care provider will do a physical exam and ask about your symptoms. Various tests may be ordered, such as:  X-rays of the affected joint.  Blood tests to rule out other types of arthritis. Additional tests may be used to diagnose your condition. TREATMENT  Goals of treatment are to control pain and improve joint function. Treatment plans may include:  A prescribed exercise program that allows for rest and joint relief.  A weight control plan.  Pain relief techniques, such as:  Properly applied heat and cold.  Electric pulses delivered to nerve endings under the skin (transcutaneous electrical nerve stimulation [TENS]).  Massage.  Certain nutritional  supplements.  Medicines to control pain, such as:  Acetaminophen.  Nonsteroidal anti-inflammatory drugs (NSAIDs), such as naproxen.  Narcotic or central-acting agents, such as tramadol.  Corticosteroids. These can be given orally or as an injection.  Surgery to reposition the bones and relieve pain (osteotomy) or to remove loose pieces of bone and cartilage. Joint replacement may be needed in advanced  states of osteoarthritis. HOME CARE INSTRUCTIONS   Take medicines only as directed by your health care provider.  Maintain a healthy weight. Follow your health care provider's instructions for weight control. This may include dietary instructions.  Exercise as directed. Your health care provider can recommend specific types of exercise. These may include:  Strengthening exercises. These are done to strengthen the muscles that support joints affected by arthritis. They can be performed with weights or with exercise bands to add resistance.  Aerobic activities. These are exercises, such as brisk walking or low-impact aerobics, that get your heart pumping.  Range-of-motion activities. These keep your joints limber.  Balance and agility exercises. These help you maintain daily living skills.  Rest your affected joints as directed by your health care provider.  Keep all follow-up visits as directed by your health care provider. SEEK MEDICAL CARE IF:   Your skin turns red.  You develop a rash in addition to your joint pain.  You have worsening joint pain.  You have a fever along with joint or muscle aches. SEEK IMMEDIATE MEDICAL CARE IF:  You have a significant loss of weight or appetite.  You have night sweats. [place] of Arthritis and Musculoskeletal and Skin Diseases: www.niams.SouthExposed.es  [employer] on Aging: http://kim-miller.com/  American College of Rheumatology: www.rheumatology.org   This information is not intended to replace advice given to you by your health care provider. Make sure you discuss any questions you have with your health care provider.   Document Released: 12/21/2004 Document Revised: 01/11/2014 Document Reviewed: 08/28/2012 Elsevier Interactive Patient Education [employer].

## 2014-12-09 ENCOUNTER — Other Ambulatory Visit: Payer: Self-pay | Admitting: Orthopedic Surgery

## 2014-12-09 DIAGNOSIS — M1711 Unilateral primary osteoarthritis, right knee: Secondary | ICD-10-CM

## 2014-12-09 DIAGNOSIS — M25561 Pain in right knee: Secondary | ICD-10-CM

## 2014-12-13 ENCOUNTER — Ambulatory Visit: Payer: Medicaid Other

## 2014-12-20 ENCOUNTER — Ambulatory Visit
Admission: RE | Admit: 2014-12-20 | Discharge: 2014-12-20 | Disposition: A | Discharge status: Home / Self Care | Payer: Medicaid Other | Source: Ambulatory Visit | Attending: Orthopedic Surgery | Admitting: Orthopedic Surgery

## 2014-12-20 DIAGNOSIS — M25561 Pain in right knee: Secondary | ICD-10-CM | POA: Diagnosis present

## 2014-12-20 DIAGNOSIS — M1711 Unilateral primary osteoarthritis, right knee: Secondary | ICD-10-CM | POA: Diagnosis present

## 2015-01-01 ENCOUNTER — Other Ambulatory Visit: Payer: Self-pay

## 2015-01-01 ENCOUNTER — Encounter
Admission: RE | Admit: 2015-01-01 | Discharge: 2015-01-01 | Disposition: A | Discharge status: Home / Self Care | Payer: Medicaid Other | Source: Ambulatory Visit | Attending: Orthopedic Surgery | Admitting: Orthopedic Surgery

## 2015-01-01 DIAGNOSIS — Z01812 Encounter for preprocedural laboratory examination: Secondary | ICD-10-CM | POA: Insufficient documentation

## 2015-01-01 DIAGNOSIS — I10 Essential (primary) hypertension: Secondary | ICD-10-CM | POA: Diagnosis not present

## 2015-01-01 DIAGNOSIS — Z01810 Encounter for preprocedural cardiovascular examination: Secondary | ICD-10-CM | POA: Diagnosis present

## 2015-01-01 LAB — APTT: aPTT: 23 seconds — ABNORMAL LOW (ref 24–36)

## 2015-01-01 LAB — URINALYSIS COMPLETE WITH MICROSCOPIC (ARMC ONLY)
Bacteria, UA: NONE SEEN
Bilirubin Urine: NEGATIVE
GLUCOSE, UA: 50 mg/dL — AB
Hgb urine dipstick: NEGATIVE
KETONES UR: NEGATIVE mg/dL
LEUKOCYTES UA: NEGATIVE
NITRITE: NEGATIVE
PROTEIN: NEGATIVE mg/dL
SPECIFIC GRAVITY, URINE: 1.027 (ref 1.005–1.030)
pH: 5 (ref 5.0–8.0)

## 2015-01-01 LAB — TYPE AND SCREEN
ABO/RH(D): B POS
ANTIBODY SCREEN: NEGATIVE

## 2015-01-01 LAB — SURGICAL PCR SCREEN
MRSA, PCR: NEGATIVE
Staphylococcus aureus: NEGATIVE

## 2015-01-01 LAB — BASIC METABOLIC PANEL
Anion gap: 9 (ref 5–15)
BUN: 15 mg/dL (ref 6–20)
CHLORIDE: 98 mmol/L — AB (ref 101–111)
CO2: 34 mmol/L — ABNORMAL HIGH (ref 22–32)
Calcium: 8.8 mg/dL — ABNORMAL LOW (ref 8.9–10.3)
Creatinine, Ser: 0.51 mg/dL (ref 0.44–1.00)
GFR calc Af Amer: >60 mL/min (ref >60)
GFR calc non Af Amer: >60 mL/min (ref >60)
GLUCOSE: 234 mg/dL — AB (ref 65–99)
POTASSIUM: 2.9 mmol/L — AB (ref 3.5–5.1)
Sodium: 141 mmol/L (ref 135–145)

## 2015-01-01 LAB — CBC
HCT: 40.4 % (ref 35.0–47.0)
HEMOGLOBIN: 13 g/dL (ref 12.0–16.0)
MCH: 24.5 pg — AB (ref 26.0–34.0)
MCHC: 32.1 g/dL (ref 32.0–36.0)
MCV: 76.2 fL — AB (ref 80.0–100.0)
Platelets: 293 10*3/uL (ref 150–440)
RBC: 5.3 MIL/uL — AB (ref 3.80–5.20)
RDW: 13.9 % (ref 11.5–14.5)
WBC: 8.8 10*3/uL (ref 3.6–11.0)

## 2015-01-01 LAB — ABO/RH: ABO/RH(D): B POS

## 2015-01-01 LAB — PROTIME-INR
INR: 0.96
Prothrombin Time: 13 seconds (ref 11.4–15.0)

## 2015-01-01 LAB — SEDIMENTATION RATE: Sed Rate: 5 mm/hr (ref 0–30)

## 2015-01-01 NOTE — Patient Instructions (Addendum)
  Your procedure is scheduled on: Tuesday Jan. 9, 2017. Report to Same Day Surgery. To find out your arrival time please call [phone] between 1PM - 3PM on Monday Jan. 9, 2017 .  Remember: Instructions that are not followed completely may result in serious medical risk, up to and including death, or upon the discretion of your surgeon and anesthesiologist your surgery may need to be rescheduled.    _x___ 1. Do not eat food or drink liquids after midnight. No gum chewing or hard candies.     ____ 2. No Alcohol for 24 hours before or after surgery.   ____ 3. Bring all medications with you on the day of surgery if instructed.    __x__ 4. Notify your doctor if there is any change in your medical condition     (cold, fever, infections).     Do not wear jewelry, make-up, hairpins, clips or nail polish.  Do not wear lotions, powders, or perfumes. You may wear deodorant.  Do not shave 48 hours prior to surgery. Men may shave face and neck.  Do not bring valuables to the hospital.    [facility] is not responsible for any belongings or valuables.               Contacts, dentures or bridgework may not be worn into surgery.  Leave your suitcase in the car. After surgery it may be brought to your room.  For patients admitted to the hospital, discharge time is determined by your treatment team.   Patients discharged the day of surgery will not be allowed to drive home.    Please read over the following fact sheets that you were given:   [facility] Preparing for Surgery  __x__ Take these medicines the morning of surgery with A SIP OF WATER:    1. losartan (COZAAR)    2. metoprolol tartrate (LOPRESSOR  3. pantoprazole (PROTONIX)  4. Oxycodone can be taken if needed.   ____ Fleet Enema (as directed)   _x___ Use CHG Soap as directed  _x___ Use inhalers on the day of surgery and bring to the hospital.  _x___ Stop metformin 2 days prior to surgery January 12, 2015    ____ Take 1/2 of  usual insulin dose the night before surgery and none on the morning of surgery.   ____ Stop Coumadin/Plavix/aspirin on .  _x___ Stop Anti-inflammatories Ibuprofen  on January 05, 2015.  Can use Oxycodone or Tylenol for pain.   ____ Stop supplements until after surgery.    ____ Bring C-Pap to the hospital.

## 2015-01-01 NOTE — Pre-Procedure Instructions (Signed)
Received a critical value of potassium 2.9 from today's lab results.  Faxed and called Potassium result to Dr. Rudene Christians office for eval and treatment along with notification that potassium will be repeated am of surgery.

## 2015-01-01 NOTE — Pre-Procedure Instructions (Signed)
As instructed by Dr Andree Elk, request for clearance called and faxed to [facility] at Dr Quay Burow and to [facility] at Dr Rudene Christians.

## 2015-01-02 LAB — URINE CULTURE: SPECIAL REQUESTS: NORMAL

## 2015-01-02 NOTE — Pre-Procedure Instructions (Signed)
Urine culture results sent to Dr. Rudene Christians.  Questioning if he would like a recollection done?

## 2015-01-03 NOTE — Pre-Procedure Instructions (Signed)
At 1540 called Dr. Quay Burow at [facility] to check on status of medical clearance, office was closed.

## 2015-01-08 NOTE — Pre-Procedure Instructions (Signed)
LEFT MESSAGE AT PCP OFFICE RE STATUS OF CLEARANCE

## 2015-01-09 ENCOUNTER — Encounter: Payer: Self-pay | Admitting: *Deleted

## 2015-01-09 ENCOUNTER — Other Ambulatory Visit: Payer: Self-pay

## 2015-01-09 ENCOUNTER — Emergency Department
Admission: EM | Admit: 2015-01-09 | Discharge: 2015-01-09 | Disposition: A | Discharge status: Home / Self Care | Payer: Medicaid Other | Attending: Emergency Medicine | Admitting: Emergency Medicine

## 2015-01-09 DIAGNOSIS — M11261 Other chondrocalcinosis, right knee: Secondary | ICD-10-CM | POA: Diagnosis not present

## 2015-01-09 DIAGNOSIS — M25461 Effusion, right knee: Secondary | ICD-10-CM | POA: Diagnosis not present

## 2015-01-09 DIAGNOSIS — R112 Nausea with vomiting, unspecified: Secondary | ICD-10-CM | POA: Diagnosis not present

## 2015-01-09 DIAGNOSIS — R197 Diarrhea, unspecified: Secondary | ICD-10-CM | POA: Diagnosis not present

## 2015-01-09 DIAGNOSIS — Z880 Allergy status to penicillin: Secondary | ICD-10-CM | POA: Diagnosis not present

## 2015-01-09 DIAGNOSIS — Z79899 Other long term (current) drug therapy: Secondary | ICD-10-CM | POA: Insufficient documentation

## 2015-01-09 DIAGNOSIS — M79604 Pain in right leg: Secondary | ICD-10-CM | POA: Diagnosis present

## 2015-01-09 DIAGNOSIS — I10 Essential (primary) hypertension: Secondary | ICD-10-CM | POA: Diagnosis not present

## 2015-01-09 DIAGNOSIS — Z792 Long term (current) use of antibiotics: Secondary | ICD-10-CM | POA: Diagnosis not present

## 2015-01-09 DIAGNOSIS — R000 Tachycardia, unspecified: Secondary | ICD-10-CM | POA: Diagnosis not present

## 2015-01-09 DIAGNOSIS — Z87891 Personal history of nicotine dependence: Secondary | ICD-10-CM | POA: Diagnosis not present

## 2015-01-09 DIAGNOSIS — Z7984 Long term (current) use of oral hypoglycemic drugs: Secondary | ICD-10-CM | POA: Diagnosis not present

## 2015-01-09 DIAGNOSIS — E119 Type 2 diabetes mellitus without complications: Secondary | ICD-10-CM | POA: Insufficient documentation

## 2015-01-09 LAB — COMPREHENSIVE METABOLIC PANEL
ALBUMIN: 4.2 g/dL (ref 3.5–5.0)
ALT: 15 U/L (ref 14–54)
AST: 12 U/L — AB (ref 15–41)
Alkaline Phosphatase: 54 U/L (ref 38–126)
Anion gap: 12 (ref 5–15)
BILIRUBIN TOTAL: 1.4 mg/dL — AB (ref 0.3–1.2)
BUN: 12 mg/dL (ref 6–20)
CO2: 30 mmol/L (ref 22–32)
Calcium: 8.6 mg/dL — ABNORMAL LOW (ref 8.9–10.3)
Chloride: 94 mmol/L — ABNORMAL LOW (ref 101–111)
Creatinine, Ser: 0.61 mg/dL (ref 0.44–1.00)
GFR calc Af Amer: >60 mL/min (ref >60)
GFR calc non Af Amer: >60 mL/min (ref >60)
GLUCOSE: 176 mg/dL — AB (ref 65–99)
POTASSIUM: 2.7 mmol/L — AB (ref 3.5–5.1)
SODIUM: 136 mmol/L (ref 135–145)
TOTAL PROTEIN: 7.8 g/dL (ref 6.5–8.1)

## 2015-01-09 LAB — CBC WITH DIFFERENTIAL/PLATELET
BASOS ABS: 0.1 10*3/uL (ref 0–0.1)
Basophils Relative: 1 %
EOS PCT: 1 %
Eosinophils Absolute: 0.1 10*3/uL (ref 0–0.7)
HEMATOCRIT: 39.9 % (ref 35.0–47.0)
Hemoglobin: 13.2 g/dL (ref 12.0–16.0)
LYMPHS ABS: 2.2 10*3/uL (ref 1.0–3.6)
LYMPHS PCT: 14 %
MCH: 24.8 pg — AB (ref 26.0–34.0)
MCHC: 33.2 g/dL (ref 32.0–36.0)
MCV: 74.7 fL — AB (ref 80.0–100.0)
MONO ABS: 1.8 10*3/uL — AB (ref 0.2–0.9)
MONOS PCT: 12 %
NEUTROS ABS: 11.2 10*3/uL — AB (ref 1.4–6.5)
Neutrophils Relative %: 72 %
PLATELETS: 279 10*3/uL (ref 150–440)
RBC: 5.35 MIL/uL — ABNORMAL HIGH (ref 3.80–5.20)
RDW: 14.1 % (ref 11.5–14.5)
WBC: 15.4 10*3/uL — ABNORMAL HIGH (ref 3.6–11.0)

## 2015-01-09 LAB — SYNOVIAL CELL COUNT + DIFF, W/ CRYSTALS
Eosinophils-Synovial: 0 %
LYMPHOCYTES-SYNOVIAL FLD: 2 %
MONOCYTE-MACROPHAGE-SYNOVIAL FLUID: 4 %
Neutrophil, Synovial: 94 %
Other Cells-SYN: 0
WBC, SYNOVIAL: 39194 /mm3 — AB (ref 0–200)

## 2015-01-09 LAB — GLUCOSE, CAPILLARY: Glucose-Capillary: 163 mg/dL — ABNORMAL HIGH (ref 65–99)

## 2015-01-09 LAB — TROPONIN I: Troponin I: <0.03 ng/mL (ref <0.031)

## 2015-01-09 MED ORDER — POTASSIUM CHLORIDE CRYS ER 20 MEQ PO TBCR
40.0000 meq | EXTENDED_RELEASE_TABLET | Freq: Once | ORAL | Status: AC
  Administered 2015-01-09: 40 meq via ORAL
  Filled 2015-01-09: qty 2

## 2015-01-09 MED ORDER — SODIUM CHLORIDE 0.9 % IV BOLUS (SEPSIS)
1000.0000 mL | Freq: Once | INTRAVENOUS | Status: AC
  Administered 2015-01-09: 1000 mL via INTRAVENOUS

## 2015-01-09 MED ORDER — MORPHINE SULFATE (PF) 4 MG/ML IV SOLN
4.0000 mg | Freq: Once | INTRAVENOUS | Status: AC
  Administered 2015-01-09: 4 mg via INTRAVENOUS
  Filled 2015-01-09: qty 1

## 2015-01-09 MED ORDER — MORPHINE SULFATE (PF) 2 MG/ML IV SOLN
INTRAVENOUS | Status: AC
  Administered 2015-01-09: 4 mg via INTRAVENOUS
  Filled 2015-01-09: qty 2

## 2015-01-09 MED ORDER — LIDOCAINE-EPINEPHRINE (PF) 1 %-1:200000 IJ SOLN
INTRAMUSCULAR | Status: AC
  Administered 2015-01-09: 30 mL
  Filled 2015-01-09: qty 60

## 2015-01-09 MED ORDER — ONDANSETRON HCL 4 MG/2ML IJ SOLN
4.0000 mg | Freq: Once | INTRAMUSCULAR | Status: AC
  Administered 2015-01-09: 4 mg via INTRAVENOUS
  Filled 2015-01-09: qty 2

## 2015-01-09 MED ORDER — LIDOCAINE-EPINEPHRINE (PF) 1 %-1:200000 IJ SOLN
30.0000 mL | Freq: Once | INTRAMUSCULAR | Status: AC
  Administered 2015-01-09: 30 mL

## 2015-01-09 MED ORDER — MORPHINE SULFATE (PF) 4 MG/ML IV SOLN: 4.0000 mg | Freq: Once | INTRAVENOUS | Status: AC

## 2015-01-09 NOTE — ED Provider Notes (Signed)
[facility] Emergency Department Provider Note   ____________________________________________  Time seen: Approximately 8:45 AM I have reviewed the triage vital signs and the triage nursing note.  HISTORY  Chief Complaint Emesis; Diarrhea; and Leg Pain   Historian Patient, daughter  HPI Sheila [name] is a 55 y.o. female with a history ofosteoarthritis, ulcerative colitis, hypertension, is here with complaint of vomiting 4 and diarrhea 4 this morning. Mild abdominal cramping and generalized feeling of weakness. [name] also complaining of tenderness and pain in her right knee which is newly swollen. She's had a knee replacement on the left, and she scheduled for total knee replacement on the right with Dr. Rudene Christians on Tuesday. She's noticed increased swelling of the knee joint. Fever. No redness or skin rash.    Past Medical History  Diagnosis Date  . Hypertension   . Arthritis   . Anemia   . Diabetes mellitus without complication ([place])     on metformin  . 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[place]) 38/1017  . Fatty liver   . Ulcerative colitis ([place])     on mesalamine  . COPD (chronic obstructive pulmonary disease) ([place])   . GERD (gastroesophageal reflux disease)   . Hyperlipidemia   . Glaucoma   . Renal disorder     There are no active problems to display for this patient.   Past Surgical History  Procedure Laterality Date  . Total knee arthroplasty Left 2008    Current Outpatient Rx  Name  Route  Sig  Dispense  Refill  . albuterol (PROVENTIL HFA;VENTOLIN HFA) 108 (90 BASE) MCG/ACT inhaler   Inhalation   Inhale 2 puffs into the lungs every 4 (four) hours as needed for wheezing or shortness of breath.         . brinzolamide (AZOPT) 1 % ophthalmic suspension   Both Eyes   Place 1 drop into both eyes 3 (three) times daily.         . chlorthalidone (HYGROTON) 25 MG tablet   Oral   Take 25 mg by mouth daily.       3   . fluticasone (FLONASE) 50  MCG/ACT nasal spray   Each Nare   Place 2 sprays into both nostrils daily.      11   . gabapentin (NEURONTIN) 300 MG capsule   Oral   Take 1 capsule by mouth 2 (two) times daily.      0   . ibuprofen (ADVIL,MOTRIN) 800 MG tablet   Oral   Take 800 mg by mouth every 8 (eight) hours as needed for mild pain.          Marland Kitchen KLOR-CON M20 20 MEQ tablet   Oral   Take 2 tablets by mouth 2 (two) times daily.      0     Dispense as written.   . latanoprost (XALATAN) 0.005 % ophthalmic solution   Both Eyes   Place 1 drop into both eyes at bedtime.         Marland Kitchen levofloxacin (LEVAQUIN) 500 MG tablet   Oral   Take 1 tablet by mouth daily.      0   . losartan (COZAAR) 25 MG tablet   Oral   Take 25 mg by mouth daily.          . mesalamine (APRISO) 0.375 G 24 hr capsule   Oral   Take 375 mg by mouth daily.          . metFORMIN (GLUCOPHAGE-XR)  500 MG 24 hr tablet   Oral   Take 500 mg by mouth 2 (two) times daily.          . metoprolol succinate (TOPROL-XL) 25 MG 24 hr tablet   Oral   Take 0.5 tablets by mouth daily.      3   . MUCINEX 600 MG 12 hr tablet   Oral   Take 1 tablet by mouth 2 (two) times daily as needed for cough.       0     Dispense as written.   Marland Kitchen oxycodone (OXY-IR) 5 MG capsule   Oral   Take 1 capsule (5 mg total) by mouth every 4 (four) hours as needed.   30 capsule   0   . pantoprazole (PROTONIX) 40 MG tablet   Oral   Take 40 mg by mouth every morning.         . pravastatin (PRAVACHOL) 10 MG tablet   Oral   Take 10 mg by mouth at bedtime.           Allergies Ampicillin; Erythromycin; Omeprazole; and Penicillins  No family history on file.  Social History Social History  Substance Use Topics  . Smoking status: Former Smoker    Quit date: 01/04/1978  . Smokeless tobacco: None  . Alcohol Use: No     Comment: Recovering alcoholic since 30/1601    Review of Systems  Constitutional: Negative for fever. Eyes: Negative for  visual changes. ENT: Negative for sore throat. Cardiovascular: Negative for chest pain. Respiratory: Negative for shortness of breath. Gastrointestinal: Positive per history of present illness. Genitourinary: Negative for dysuria. Musculoskeletal: Negative for back pain. Skin: Negative for rash. Neurological: Negative for headache. 10 point Review of Systems otherwise negative ____________________________________________   PHYSICAL EXAM:  VITAL SIGNS: ED Triage Vitals  Enc Vitals Group     BP 01/09/15 0812 124/76 mmHg     Pulse Rate 01/09/15 0812 116     Resp 01/09/15 0812 20     Temp 01/09/15 0812 97.9 F (36.6 C)     Temp Source 01/09/15 0812 Oral     SpO2 01/09/15 0812 95 %     Weight 01/09/15 0812 180 lb (81.647 kg)     Height 01/09/15 0812 5' (1.524 m)     Head Cir --      Peak Flow --      Pain Score 01/09/15 0813 10     Pain Loc --      Pain Edu? --      Excl. in [place]? --      Constitutional: Alert and oriented. Well appearing and in no distress. Eyes: Conjunctivae are normal. PERRL. Normal extraocular movements. ENT   Head: Normocephalic and atraumatic.   Nose: No congestion/rhinnorhea.   Mouth/Throat: Mucous membranes are moist.   Neck: No stridor. Cardiovascular/Chest: Tachycardic regular. No murmurs, rubs, or gallops. Respiratory: Normal respiratory effort without tachypnea nor retractions. Breath sounds are clear and equal bilaterally. No wheezes/rales/rhonchi. Gastrointestinal: Soft. No distention, no guarding, no rebound. Nontender. Obese.  Genitourinary/rectal:Deferred Musculoskeletal: No lower extremity edema. Right knee with knee effusion. No warmth or redness to the skin. Neurologic:  Normal speech and language. No gross or focal neurologic deficits are appreciated. Skin:  Skin is warm, dry and intact. No rash noted. Psychiatric: Mood and affect are normal. Speech and behavior are normal. Patient exhibits appropriate insight and  judgment.  ____________________________________________   EKG I, Lisa Roca, MD, the attending physician have personally viewed  and interpreted all ECGs.  103 bpm. Sinus tachycardia. Narrow QRS. Normal axis. Minimal ST segment depression inferiorly with T-wave inversion inferiorly and laterally. ____________________________________________  LABS (pertinent positives/negatives)  Comprehensive metabolic panel significant for potassium 2.7 White blood count 15.4 with left shift, hemoglobin 13.2 and platelet count 279 Troponin less than 0.03 Synovial fluid, right knee:  Yellow, cloudy, extracellular calcium phosphate crystals. WBC 39194. Neutrophils 94 Gram stain:  Too numerous to count white blood cells, no organisms seen  ____________________________________________  RADIOLOGY All Xrays were viewed by me. Imaging interpreted by Radiologist.  None __________________________________________  PROCEDURES  Procedure(s) performed: Right knee synovial fluid aspiration. Performed by Dr. Reita Cliche, M.D.  Written consent obtained after discussing risks and benefits. Chlorhexidine used to clean the skin. Sterile technique used. Local numbing with 1% lidocaine with epinephrine. 18-gauge needle with upper lateral approach removed yellow translucent synovial fluid volume approximately 20 cc. Of fluid sent for synovial fluid studies. No complications.  Ace wrap applied to right knee  Critical Care performed: None  ____________________________________________   ED COURSE / ASSESSMENT AND PLAN  CONSULTATIONS: Dr. Truitt Leep, orthopedic surgery. Recommended outpatient follow-up and conservative treatment for pseudogout arthritis.  Pertinent labs & imaging results that were available during my care of the patient were reviewed by me and considered in my medical decision making (see chart for details).  Patient was here for 2 seemingly unrelated complaints, one being nausea and vomiting for the last  several hours, and the other being right knee pain. In terms of the nausea and vomiting, associated with diarrhea, I suspect this is a viral gastroenteritis which is going around the community. There is no fever or abdominal pain. Her white blood count is minimally elevated, but without abdominal pain, I do not suspect an intra-abdominal emergency. She was given IV hydration and symptomatic medications. I do think telemetry white blood count in her bloodstream is associated with the acute intestinal illness with vomiting and diarrhea, rather than systemic manifestation coming from her knee.  The pain medicine was for her right knee, not for her abdomen.  She states that she was treated recently for low potassium, she does have low potassium here. She was able to keep down tolerate 40 mEq of potassium by mouth.  In terms of the right knee pain, she does have chronic right knee pain and also arthritis and is scheduled for total knee on Tuesday. There is a new worsening pain with swelling and joint effusion. Joint fluid was aspirated and an Ace wrap placed.  I discussed the synovial fluid results with patient's orthopedic physician Dr. Truitt Leep, who feels the analysis is consistent with inflammatory pseudogout. Clinically I also think this is inflammatory pseudogout, versus bacterial infection. A culture was sent.  No additional episodes of vomiting diarrhea here in the department. Patient be discharged home with Zofran tablet, and she can continue to treat her right knee pain with anti-inflammatories and/or hydrocodone.   Patient / Family / Caregiver informed of clinical course, medical decision-making process, and agree with plan.   I discussed return precautions, follow-up instructions, and discharged instructions with patient and/or family.  ___________________________________________   FINAL CLINICAL IMPRESSION(S) / ED DIAGNOSES   Final diagnoses:  Nausea vomiting and diarrhea  Knee effusion,  right  Pseudogout of right knee       Lisa Roca, MD 01/09/15 1521

## 2015-01-09 NOTE — ED Notes (Signed)
Took patient to restroom.

## 2015-01-09 NOTE — ED Notes (Signed)
Pt is scheduled for a right total knee replacement on Tuesday, pt complains of right leg pain , nausea and vomiting for 1 day

## 2015-01-09 NOTE — ED Notes (Signed)
R knee fluid walked down to lab. Paper in lab filled out when dropping off fluid

## 2015-01-09 NOTE — Discharge Instructions (Signed)
You were evaluated for vomiting and diarrhea, and I suspect a likely viral illness. Return to emergency department for any worsening condition including fever, or any abdominal pain. Return for any worsening condition including vomiting and diarrhea where your concern for dehydration including not making urine, are dry mouth. Follow-up with the primary care physician.  You were evaluated for your right knee swelling, and fluid was pulled off and shows "crystal "inflammation. Continue to take over-the-counter ibuprofen as tolerated, as well as hydrocodone for pain. Follow-up with Dr. Rudene Christians next week as scheduled for surgery.     Knee Effusion Knee effusion means that you have extra fluid in your knee. This can cause pain. Your knee may be more difficult to bend and move. HOME CARE  Use crutches as told by your doctor.  Wear a knee brace as told by your doctor.  Apply ice to the swollen area:  Put ice in a plastic bag.  Place a towel between your skin and the bag.  Leave the ice on for 20 minutes, 2-3 times per day.  Keep your knee raised (elevated) when you are sitting or lying down.  Take medicines only as told by your doctor.  Do any rehabilitation or strengthening exercises as told by your doctor.  Rest your knee as told by your doctor. You may start doing your normal activities again when your doctor says it is okay.  Keep all follow-up visits as told by your doctor. This is important. GET HELP IF:   You continue to have pain in your knee. GET HELP RIGHT AWAY IF:  You have increased swelling or redness of your knee.  You have severe pain in your knee.  You have a fever.   This information is not intended to replace advice given to you by your health care provider. Make sure you discuss any questions you have with your health care provider.   Document Released: 01/23/2010 Document Revised: 01/11/2014 Document Reviewed: 08/06/2013 Elsevier Interactive Patient Education  2016 Elsevier Inc.  Viral Gastroenteritis Viral gastroenteritis is also known as stomach flu. This condition affects the stomach and intestinal tract. It can cause sudden diarrhea and vomiting. The illness typically lasts 3 to 8 days. Most people develop an immune response that eventually gets rid of the virus. While this natural response develops, the virus can make you quite ill. CAUSES  Many different viruses can cause gastroenteritis, such as rotavirus or noroviruses. You can catch one of these viruses by consuming contaminated food or water. You may also catch a virus by sharing utensils or other personal items with an infected person or by touching a contaminated surface. SYMPTOMS  The most common symptoms are diarrhea and vomiting. These problems can cause a severe loss of body fluids (dehydration) and a body salt (electrolyte) imbalance. Other symptoms may include:  Fever.  Headache.  Fatigue.  Abdominal pain. DIAGNOSIS  Your caregiver can usually diagnose viral gastroenteritis based on your symptoms and a physical exam. A stool sample may also be taken to test for the presence of viruses or other infections. TREATMENT  This illness typically goes away on its own. Treatments are aimed at rehydration. The most serious cases of viral gastroenteritis involve vomiting so severely that you are not able to keep fluids down. In these cases, fluids must be given through an intravenous line (IV). HOME CARE INSTRUCTIONS   Drink enough fluids to keep your urine clear or pale yellow. Drink small amounts of fluids frequently and increase the amounts  as tolerated.  Ask your caregiver for specific rehydration instructions.  Avoid:  Foods high in sugar.  Alcohol.  Carbonated drinks.  Tobacco.  Juice.  Caffeine drinks.  Extremely hot or cold fluids.  Fatty, greasy foods.  Too much intake of anything at one time.  Dairy products until 24 to 48 hours after diarrhea stops.  You  may consume probiotics. Probiotics are active cultures of beneficial bacteria. They may lessen the amount and number of diarrheal stools in adults. Probiotics can be found in yogurt with active cultures and in supplements.  Wash your hands well to avoid spreading the virus.  Only take over-the-counter or prescription medicines for pain, discomfort, or fever as directed by your caregiver. Do not give aspirin to children. Antidiarrheal medicines are not recommended.  Ask your caregiver if you should continue to take your regular prescribed and over-the-counter medicines.  Keep all follow-up appointments as directed by your caregiver. SEEK IMMEDIATE MEDICAL CARE IF:   You are unable to keep fluids down.  You do not urinate at least once every 6 to 8 hours.  You develop shortness of breath.  You notice blood in your stool or vomit. This may look like coffee grounds.  You have abdominal pain that increases or is concentrated in one small area (localized).  You have persistent vomiting or diarrhea.  You have a fever.  The patient is a child younger than 3 months, and he or she has a fever.  The patient is a child older than 3 months, and he or she has a fever and persistent symptoms.  The patient is a child older than 3 months, and he or she has a fever and symptoms suddenly get worse.  The patient is a baby, and he or she has no tears when crying. MAKE SURE YOU:   Understand these instructions.  Will watch your condition.  Will get help right away if you are not doing well or get worse.   This information is not intended to replace advice given to you by your health care provider. Make sure you discuss any questions you have with your health care provider.   Document Released: 12/21/2004 Document Revised: 03/15/2011 Document Reviewed: 10/07/2010 Elsevier Interactive Patient Education [employer].

## 2015-01-10 NOTE — Pre-Procedure Instructions (Signed)
Cardiac clearance received from [facility] and placed on chart.

## 2015-01-14 ENCOUNTER — Inpatient Hospital Stay: Payer: Medicaid Other | Admitting: Certified Registered Nurse Anesthetist

## 2015-01-14 ENCOUNTER — Encounter
Admission: RE | Disposition: A | Discharge status: Home Health | Payer: Self-pay | Source: Ambulatory Visit | Attending: Orthopedic Surgery

## 2015-01-14 ENCOUNTER — Inpatient Hospital Stay
Admission: RE | Admit: 2015-01-14 | Discharge: 2015-01-17 | DRG: 470 | Disposition: A | Discharge status: Home Health | Payer: Medicaid Other | Source: Ambulatory Visit | Attending: Orthopedic Surgery | Admitting: Orthopedic Surgery

## 2015-01-14 ENCOUNTER — Inpatient Hospital Stay: Payer: Medicaid Other

## 2015-01-14 ENCOUNTER — Encounter: Payer: Self-pay | Admitting: *Deleted

## 2015-01-14 DIAGNOSIS — Z6836 Body mass index (BMI) 36.0-36.9, adult: Secondary | ICD-10-CM | POA: Diagnosis not present

## 2015-01-14 DIAGNOSIS — E119 Type 2 diabetes mellitus without complications: Secondary | ICD-10-CM | POA: Diagnosis present

## 2015-01-14 DIAGNOSIS — E785 Hyperlipidemia, unspecified: Secondary | ICD-10-CM | POA: Diagnosis present

## 2015-01-14 DIAGNOSIS — E871 Hypo-osmolality and hyponatremia: Secondary | ICD-10-CM | POA: Diagnosis not present

## 2015-01-14 DIAGNOSIS — J45909 Unspecified asthma, uncomplicated: Secondary | ICD-10-CM | POA: Diagnosis present

## 2015-01-14 DIAGNOSIS — I10 Essential (primary) hypertension: Secondary | ICD-10-CM | POA: Diagnosis present

## 2015-01-14 DIAGNOSIS — Z7984 Long term (current) use of oral hypoglycemic drugs: Secondary | ICD-10-CM

## 2015-01-14 DIAGNOSIS — J449 Chronic obstructive pulmonary disease, unspecified: Secondary | ICD-10-CM | POA: Diagnosis present

## 2015-01-14 DIAGNOSIS — Z880 Allergy status to penicillin: Secondary | ICD-10-CM

## 2015-01-14 DIAGNOSIS — Z79899 Other long term (current) drug therapy: Secondary | ICD-10-CM

## 2015-01-14 DIAGNOSIS — K5900 Constipation, unspecified: Secondary | ICD-10-CM | POA: Diagnosis not present

## 2015-01-14 DIAGNOSIS — H409 Unspecified glaucoma: Secondary | ICD-10-CM | POA: Diagnosis present

## 2015-01-14 DIAGNOSIS — E861 Hypovolemia: Secondary | ICD-10-CM | POA: Diagnosis present

## 2015-01-14 DIAGNOSIS — R000 Tachycardia, unspecified: Secondary | ICD-10-CM | POA: Diagnosis not present

## 2015-01-14 DIAGNOSIS — M1711 Unilateral primary osteoarthritis, right knee: Principal | ICD-10-CM | POA: Diagnosis present

## 2015-01-14 DIAGNOSIS — F419 Anxiety disorder, unspecified: Secondary | ICD-10-CM | POA: Diagnosis not present

## 2015-01-14 DIAGNOSIS — K219 Gastro-esophageal reflux disease without esophagitis: Secondary | ICD-10-CM | POA: Diagnosis present

## 2015-01-14 DIAGNOSIS — E860 Dehydration: Secondary | ICD-10-CM | POA: Diagnosis not present

## 2015-01-14 DIAGNOSIS — Z87891 Personal history of nicotine dependence: Secondary | ICD-10-CM | POA: Diagnosis not present

## 2015-01-14 DIAGNOSIS — K760 Fatty (change of) liver, not elsewhere classified: Secondary | ICD-10-CM | POA: Diagnosis present

## 2015-01-14 DIAGNOSIS — Z881 Allergy status to other antibiotic agents status: Secondary | ICD-10-CM | POA: Diagnosis not present

## 2015-01-14 DIAGNOSIS — G8918 Other acute postprocedural pain: Secondary | ICD-10-CM

## 2015-01-14 HISTORY — PX: JOINT REPLACEMENT: SHX530

## 2015-01-14 HISTORY — PX: TOTAL KNEE ARTHROPLASTY: SHX125

## 2015-01-14 HISTORY — DX: Unspecified asthma, uncomplicated: J45.909

## 2015-01-14 LAB — CBC
HEMATOCRIT: 37.2 % (ref 35.0–47.0)
HEMOGLOBIN: 11.9 g/dL — AB (ref 12.0–16.0)
MCH: 24.3 pg — AB (ref 26.0–34.0)
MCHC: 31.9 g/dL — ABNORMAL LOW (ref 32.0–36.0)
MCV: 76.3 fL — ABNORMAL LOW (ref 80.0–100.0)
Platelets: 354 10*3/uL (ref 150–440)
RBC: 4.88 MIL/uL (ref 3.80–5.20)
RDW: 14.6 % — ABNORMAL HIGH (ref 11.5–14.5)
WBC: 7.1 10*3/uL (ref 3.6–11.0)

## 2015-01-14 LAB — POCT I-STAT 4, (NA,K, GLUC, HGB,HCT)
GLUCOSE: 204 mg/dL — AB (ref 65–99)
HEMATOCRIT: 43 % (ref 36.0–46.0)
HEMOGLOBIN: 14.6 g/dL (ref 12.0–15.0)
Potassium: 3 mmol/L — ABNORMAL LOW (ref 3.5–5.1)
Sodium: 140 mmol/L (ref 135–145)

## 2015-01-14 LAB — GLUCOSE, CAPILLARY
GLUCOSE-CAPILLARY: 155 mg/dL — AB (ref 65–99)
GLUCOSE-CAPILLARY: 159 mg/dL — AB (ref 65–99)
Glucose-Capillary: 212 mg/dL — ABNORMAL HIGH (ref 65–99)
Glucose-Capillary: 114 mg/dL — ABNORMAL HIGH (ref 65–99)
Glucose-Capillary: 142 mg/dL — ABNORMAL HIGH (ref 65–99)

## 2015-01-14 LAB — BODY FLUID CULTURE
Culture: NO GROWTH
Special Requests: NORMAL

## 2015-01-14 LAB — CREATININE, SERUM
CREATININE: 0.47 mg/dL (ref 0.44–1.00)
GFR calc Af Amer: >60 mL/min (ref >60)

## 2015-01-14 SURGERY — ARTHROPLASTY, KNEE, TOTAL
Anesthesia: Spinal | Site: Knee | Laterality: Right | Wound class: Clean

## 2015-01-14 MED ORDER — MAGNESIUM CITRATE PO SOLN
1.0000 | Freq: Once | ORAL | Status: AC | PRN
  Administered 2015-01-16: 1 via ORAL
  Filled 2015-01-14 (×2): qty 296

## 2015-01-14 MED ORDER — ONDANSETRON HCL 4 MG/2ML IJ SOLN: 4.0000 mg | Freq: Once | INTRAMUSCULAR | Status: DC | PRN

## 2015-01-14 MED ORDER — GUAIFENESIN ER 600 MG PO TB12: 600.0000 mg | ORAL_TABLET | Freq: Two times a day (BID) | ORAL | Status: DC | PRN

## 2015-01-14 MED ORDER — METFORMIN HCL ER 500 MG PO TB24
500.0000 mg | ORAL_TABLET | Freq: Two times a day (BID) | ORAL | Status: DC
  Administered 2015-01-14 – 2015-01-17 (×6): 500 mg via ORAL
  Filled 2015-01-14 (×8): qty 1

## 2015-01-14 MED ORDER — BUPIVACAINE-EPINEPHRINE (PF) 0.25% -1:200000 IJ SOLN
INTRAMUSCULAR | Status: DC | PRN
Start: 2015-01-14 — End: 2015-01-14
  Administered 2015-01-14: 30 mL via PERINEURAL

## 2015-01-14 MED ORDER — MORPHINE SULFATE (PF) 10 MG/ML IV SOLN
INTRAVENOUS | Status: AC
  Filled 2015-01-14: qty 1

## 2015-01-14 MED ORDER — MORPHINE SULFATE 10 MG/ML IJ SOLN
INTRAMUSCULAR | Status: DC | PRN
  Administered 2015-01-14: 10 mg via INTRAVENOUS

## 2015-01-14 MED ORDER — ACETAMINOPHEN 325 MG PO TABS: 650.0000 mg | ORAL_TABLET | Freq: Four times a day (QID) | ORAL | Status: DC | PRN

## 2015-01-14 MED ORDER — PHENYLEPHRINE HCL 10 MG/ML IJ SOLN
INTRAMUSCULAR | Status: DC | PRN
  Administered 2015-01-14: 200 ug via INTRAVENOUS
  Administered 2015-01-14: 100 ug via INTRAVENOUS

## 2015-01-14 MED ORDER — ENOXAPARIN SODIUM 30 MG/0.3ML ~~LOC~~ SOLN
30.0000 mg | Freq: Two times a day (BID) | SUBCUTANEOUS | Status: DC
  Administered 2015-01-15 – 2015-01-17 (×5): 30 mg via SUBCUTANEOUS
  Filled 2015-01-14 (×5): qty 0.3

## 2015-01-14 MED ORDER — SODIUM CHLORIDE 0.9 % IV SOLN
250.0000 mg | INTRAVENOUS | Status: DC | PRN
  Administered 2015-01-14: 4 ug/kg/min via INTRAVENOUS

## 2015-01-14 MED ORDER — METOPROLOL SUCCINATE ER 25 MG PO TB24
12.5000 mg | ORAL_TABLET | Freq: Every day | ORAL | Status: DC
  Administered 2015-01-15 – 2015-01-16 (×2): 12.5 mg via ORAL
  Filled 2015-01-14 (×2): qty 1

## 2015-01-14 MED ORDER — ONDANSETRON HCL 4 MG/2ML IJ SOLN
4.0000 mg | Freq: Four times a day (QID) | INTRAMUSCULAR | Status: DC | PRN
  Administered 2015-01-14 – 2015-01-16 (×2): 4 mg via INTRAVENOUS
  Filled 2015-01-14 (×2): qty 2

## 2015-01-14 MED ORDER — GABAPENTIN 300 MG PO CAPS
300.0000 mg | ORAL_CAPSULE | Freq: Two times a day (BID) | ORAL | Status: DC
  Administered 2015-01-14 – 2015-01-17 (×7): 300 mg via ORAL
  Filled 2015-01-14 (×7): qty 1

## 2015-01-14 MED ORDER — SODIUM CHLORIDE 0.9 % IV SOLN
INTRAVENOUS | Status: DC | PRN
  Administered 2015-01-14: 60 mL

## 2015-01-14 MED ORDER — SODIUM CHLORIDE 0.9 % IV SOLN
INTRAVENOUS | Status: DC
  Administered 2015-01-14 (×2): via INTRAVENOUS

## 2015-01-14 MED ORDER — OXYCODONE HCL 5 MG PO TABS
5.0000 mg | ORAL_TABLET | ORAL | Status: DC | PRN
  Administered 2015-01-14: 5 mg via ORAL
  Administered 2015-01-14: 10 mg via ORAL
  Administered 2015-01-14: 5 mg via ORAL
  Administered 2015-01-15 (×2): 10 mg via ORAL
  Administered 2015-01-16: 5 mg via ORAL
  Filled 2015-01-14: qty 1
  Filled 2015-01-14: qty 2
  Filled 2015-01-14: qty 1
  Filled 2015-01-14: qty 2
  Filled 2015-01-14: qty 1
  Filled 2015-01-14: qty 2

## 2015-01-14 MED ORDER — MIDAZOLAM HCL 5 MG/5ML IJ SOLN
INTRAMUSCULAR | Status: DC | PRN
  Administered 2015-01-14: 2 mg via INTRAVENOUS

## 2015-01-14 MED ORDER — ALBUTEROL SULFATE (2.5 MG/3ML) 0.083% IN NEBU: 2.5000 mg | INHALATION_SOLUTION | RESPIRATORY_TRACT | Status: DC | PRN

## 2015-01-14 MED ORDER — INSULIN ASPART 100 UNIT/ML ~~LOC~~ SOLN
0.0000 [IU] | Freq: Three times a day (TID) | SUBCUTANEOUS | Status: DC
  Administered 2015-01-15 (×2): 3 [IU] via SUBCUTANEOUS
  Administered 2015-01-15: 5 [IU] via SUBCUTANEOUS
  Administered 2015-01-16 – 2015-01-17 (×5): 3 [IU] via SUBCUTANEOUS
  Filled 2015-01-14 (×7): qty 3
  Filled 2015-01-14: qty 5

## 2015-01-14 MED ORDER — LOSARTAN POTASSIUM 25 MG PO TABS
25.0000 mg | ORAL_TABLET | Freq: Every day | ORAL | Status: DC
  Administered 2015-01-15 – 2015-01-17 (×3): 25 mg via ORAL
  Filled 2015-01-14 (×4): qty 1

## 2015-01-14 MED ORDER — HYDROMORPHONE HCL 1 MG/ML IJ SOLN
0.5000 mg | INTRAMUSCULAR | Status: DC | PRN
  Administered 2015-01-14: 0.5 mg via INTRAVENOUS
  Filled 2015-01-14: qty 1

## 2015-01-14 MED ORDER — LATANOPROST 0.005 % OP SOLN
1.0000 [drp] | Freq: Every day | OPHTHALMIC | Status: DC
  Administered 2015-01-14 – 2015-01-15 (×2): 1 [drp] via OPHTHALMIC
  Filled 2015-01-14: qty 2.5

## 2015-01-14 MED ORDER — LEVOFLOXACIN 500 MG PO TABS
500.0000 mg | ORAL_TABLET | Freq: Every day | ORAL | Status: DC
  Administered 2015-01-14: 500 mg via ORAL
  Filled 2015-01-14 (×2): qty 1

## 2015-01-14 MED ORDER — CLINDAMYCIN PHOSPHATE 600 MG/50ML IV SOLN
INTRAVENOUS | Status: AC
Start: 2015-01-14 — End: 2015-01-14
  Administered 2015-01-14: 600 mg via INTRAVENOUS
  Filled 2015-01-14: qty 50

## 2015-01-14 MED ORDER — MORPHINE SULFATE (PF) 2 MG/ML IV SOLN
2.0000 mg | INTRAVENOUS | Status: DC | PRN
  Administered 2015-01-14 – 2015-01-15 (×3): 2 mg via INTRAVENOUS
  Filled 2015-01-14 (×3): qty 1

## 2015-01-14 MED ORDER — SODIUM CHLORIDE 0.9 % IV SOLN
INTRAVENOUS | Status: DC
  Administered 2015-01-14: 21:00:00 via INTRAVENOUS

## 2015-01-14 MED ORDER — CLINDAMYCIN PHOSPHATE 900 MG/50ML IV SOLN
900.0000 mg | Freq: Four times a day (QID) | INTRAVENOUS | Status: AC
  Administered 2015-01-14 (×2): 900 mg via INTRAVENOUS
  Filled 2015-01-14 (×2): qty 50

## 2015-01-14 MED ORDER — PHENOL 1.4 % MT LIQD
1.0000 | OROMUCOSAL | Status: DC | PRN
  Filled 2015-01-14: qty 177

## 2015-01-14 MED ORDER — MESALAMINE ER 0.375 G PO CP24
0.3750 g | ORAL_CAPSULE | Freq: Every day | ORAL | Status: DC
  Administered 2015-01-15 – 2015-01-17 (×3): 0.375 g via ORAL
  Filled 2015-01-14 (×4): qty 1

## 2015-01-14 MED ORDER — FENTANYL CITRATE (PF) 100 MCG/2ML IJ SOLN
INTRAMUSCULAR | Status: DC | PRN
  Administered 2015-01-14 (×2): 50 ug via INTRAVENOUS

## 2015-01-14 MED ORDER — PRAVASTATIN SODIUM 10 MG PO TABS
10.0000 mg | ORAL_TABLET | Freq: Every day | ORAL | Status: DC
  Administered 2015-01-14 – 2015-01-16 (×2): 10 mg via ORAL
  Filled 2015-01-14 (×2): qty 1

## 2015-01-14 MED ORDER — METOCLOPRAMIDE HCL 5 MG PO TABS
5.0000 mg | ORAL_TABLET | Freq: Three times a day (TID) | ORAL | Status: DC | PRN
Start: 2015-01-14 — End: 2015-01-17

## 2015-01-14 MED ORDER — KETAMINE HCL 50 MG/ML IJ SOLN
INTRAMUSCULAR | Status: DC | PRN
  Administered 2015-01-14: .8 mg via INTRAMUSCULAR
  Administered 2015-01-14 (×2): 2.4 mg via INTRAMUSCULAR

## 2015-01-14 MED ORDER — PROPOFOL 10 MG/ML IV BOLUS
INTRAVENOUS | Status: DC | PRN
  Administered 2015-01-14: 24 mg via INTRAVENOUS
  Administered 2015-01-14: 8 mg via INTRAVENOUS
  Administered 2015-01-14: 24 mg via INTRAVENOUS

## 2015-01-14 MED ORDER — ONDANSETRON HCL 4 MG PO TABS
4.0000 mg | ORAL_TABLET | Freq: Four times a day (QID) | ORAL | Status: DC | PRN
  Administered 2015-01-15 – 2015-01-16 (×2): 4 mg via ORAL
  Filled 2015-01-14 (×2): qty 1

## 2015-01-14 MED ORDER — ACETAMINOPHEN 650 MG RE SUPP: 650.0000 mg | Freq: Four times a day (QID) | RECTAL | Status: DC | PRN

## 2015-01-14 MED ORDER — CLINDAMYCIN PHOSPHATE 600 MG/50ML IV SOLN: 600.0000 mg | Freq: Once | INTRAVENOUS | Status: DC

## 2015-01-14 MED ORDER — CHLORTHALIDONE 25 MG PO TABS
25.0000 mg | ORAL_TABLET | Freq: Every day | ORAL | Status: DC
  Administered 2015-01-14 – 2015-01-17 (×4): 25 mg via ORAL
  Filled 2015-01-14 (×4): qty 1

## 2015-01-14 MED ORDER — PROPOFOL 500 MG/50ML IV EMUL
INTRAVENOUS | Status: DC | PRN
  Administered 2015-01-14: 40 ug/kg/min via INTRAVENOUS

## 2015-01-14 MED ORDER — NEOMYCIN-POLYMYXIN B GU 40-200000 IR SOLN
Status: DC | PRN
  Administered 2015-01-14: 16 mL

## 2015-01-14 MED ORDER — ALBUTEROL SULFATE HFA 108 (90 BASE) MCG/ACT IN AERS: 2.0000 | INHALATION_SPRAY | RESPIRATORY_TRACT | Status: DC | PRN

## 2015-01-14 MED ORDER — PANTOPRAZOLE SODIUM 40 MG PO TBEC
40.0000 mg | DELAYED_RELEASE_TABLET | ORAL | Status: DC
Start: 2015-01-15 — End: 2015-01-17
  Administered 2015-01-15 – 2015-01-17 (×3): 40 mg via ORAL
  Filled 2015-01-14 (×3): qty 1

## 2015-01-14 MED ORDER — FENTANYL CITRATE (PF) 100 MCG/2ML IJ SOLN: 25.0000 ug | INTRAMUSCULAR | Status: DC | PRN

## 2015-01-14 MED ORDER — BRINZOLAMIDE 1 % OP SUSP
1.0000 [drp] | Freq: Three times a day (TID) | OPHTHALMIC | Status: DC
  Administered 2015-01-14 – 2015-01-17 (×8): 1 [drp] via OPHTHALMIC
  Filled 2015-01-14: qty 10

## 2015-01-14 MED ORDER — BISACODYL 5 MG PO TBEC
5.0000 mg | DELAYED_RELEASE_TABLET | Freq: Every day | ORAL | Status: DC | PRN
  Administered 2015-01-16: 5 mg via ORAL
  Filled 2015-01-14: qty 1

## 2015-01-14 MED ORDER — FLUTICASONE PROPIONATE 50 MCG/ACT NA SUSP
2.0000 | Freq: Every day | NASAL | Status: DC
  Filled 2015-01-14: qty 16

## 2015-01-14 MED ORDER — MENTHOL 3 MG MT LOZG
1.0000 | LOZENGE | OROMUCOSAL | Status: DC | PRN
  Filled 2015-01-14: qty 9

## 2015-01-14 MED ORDER — MAGNESIUM HYDROXIDE 400 MG/5ML PO SUSP
30.0000 mL | Freq: Every day | ORAL | Status: DC | PRN
  Administered 2015-01-15: 30 mL via ORAL
  Filled 2015-01-14: qty 30

## 2015-01-14 MED ORDER — PHENYLEPHRINE HCL 10 MG/ML IJ SOLN
10000.0000 ug | INTRAMUSCULAR | Status: DC | PRN
  Administered 2015-01-14: 25 ug/min via INTRAVENOUS

## 2015-01-14 MED ORDER — METOCLOPRAMIDE HCL 5 MG/ML IJ SOLN: 5.0000 mg | Freq: Three times a day (TID) | INTRAMUSCULAR | Status: DC | PRN

## 2015-01-14 MED ORDER — POTASSIUM CHLORIDE CRYS ER 20 MEQ PO TBCR
40.0000 meq | EXTENDED_RELEASE_TABLET | Freq: Two times a day (BID) | ORAL | Status: DC
  Administered 2015-01-14 – 2015-01-17 (×7): 40 meq via ORAL
  Filled 2015-01-14 (×7): qty 2

## 2015-01-14 MED ORDER — DOCUSATE SODIUM 100 MG PO CAPS
100.0000 mg | ORAL_CAPSULE | Freq: Two times a day (BID) | ORAL | Status: DC
  Administered 2015-01-14 – 2015-01-17 (×6): 100 mg via ORAL
  Filled 2015-01-14 (×6): qty 1

## 2015-01-14 SURGICAL SUPPLY — 55 items
BANDAGE ACE 6X5 VEL STRL LF (GAUZE/BANDAGES/DRESSINGS) ×3 IMPLANT
BLADE SAW 1 (BLADE) ×3 IMPLANT
BLADE SAW 1/2 (BLADE) ×3 IMPLANT
BLOCK CUTTING TIBIAL 2 RT (MISCELLANEOUS) IMPLANT
BLOCK CUTTING TIBIAL 4 RT MIS (MISCELLANEOUS) IMPLANT
BLOCK FEMUR CUTTING 2P RIGHT (MISCELLANEOUS) IMPLANT
CANISTER SUCT 1200ML W/VALVE (MISCELLANEOUS) ×3 IMPLANT
CANISTER SUCT 3000ML (MISCELLANEOUS) ×6 IMPLANT
CAPT KNEE TOTAL 3 ×3 IMPLANT
CATH FOL LEG HOLDER (MISCELLANEOUS) ×3 IMPLANT
CATH FOLEY SIL 2WAY 14FR5CC (CATHETERS) ×3 IMPLANT
CATH TRAY METER 16FR LF (MISCELLANEOUS) ×3 IMPLANT
CEMENT HV SMART SET (Cement) ×6 IMPLANT
CHLORAPREP W/TINT 26ML (MISCELLANEOUS) ×6 IMPLANT
COOLER POLAR GLACIER W/PUMP (MISCELLANEOUS) ×3 IMPLANT
DRAPE INCISE IOBAN 66X45 STRL (DRAPES) ×6 IMPLANT
DRAPE SHEET LG 3/4 BI-LAMINATE (DRAPES) ×6 IMPLANT
ELECT CAUTERY BLADE 6.4 (BLADE) ×3 IMPLANT
FEMUR BONE MODEL IMPLANT
GAUZE PETRO XEROFOAM 1X8 (MISCELLANEOUS) ×3 IMPLANT
GAUZE SPONGE 4X4 12PLY STRL (GAUZE/BANDAGES/DRESSINGS) ×3 IMPLANT
GLOVE BIOGEL PI IND STRL 9 (GLOVE) ×1 IMPLANT
GLOVE BIOGEL PI INDICATOR 9 (GLOVE) ×2
GLOVE SURG ORTHO 9.0 STRL STRW (GLOVE) ×3 IMPLANT
GOWN SPECIALTY ULTRA XL (MISCELLANEOUS) ×3 IMPLANT
GOWN STRL REUS W/ TWL LRG LVL3 (GOWN DISPOSABLE) ×2 IMPLANT
GOWN STRL REUS W/TWL LRG LVL3 (GOWN DISPOSABLE) ×4
HANDPIECE SUCTION TUBG SURGILV (MISCELLANEOUS) ×3 IMPLANT
HOOD PEEL AWAY FACE SHEILD DIS (HOOD) ×6 IMPLANT
IMMBOLIZER KNEE 19 BLUE UNIV (SOFTGOODS) ×3 IMPLANT
IV SET EXTENSION 6 LL TADAPT (SET/KITS/TRAYS/PACK) ×3 IMPLANT
KNEE MEDACTA TIBIAL/FEMORAL BL (Knees) ×3 IMPLANT
KNIFE SCULPS 14X20 (INSTRUMENTS) ×3 IMPLANT
NDL SAFETY 18GX1.5 (NEEDLE) ×3 IMPLANT
NEEDLE SPNL 18GX3.5 QUINCKE PK (NEEDLE) ×3 IMPLANT
NEEDLE SPNL 20GX3.5 QUINCKE YW (NEEDLE) ×3 IMPLANT
NS IRRIG 1000ML POUR BTL (IV SOLUTION) ×3 IMPLANT
PACK TOTAL KNEE (MISCELLANEOUS) ×3 IMPLANT
PAD GROUND ADULT SPLIT (MISCELLANEOUS) ×3 IMPLANT
PAD WRAPON POLAR KNEE (MISCELLANEOUS) ×1 IMPLANT
SOL .9 NS 3000ML IRR  AL (IV SOLUTION) ×2
SOL .9 NS 3000ML IRR UROMATIC (IV SOLUTION) ×1 IMPLANT
STAPLER SKIN PROX 35W (STAPLE) ×3 IMPLANT
STRAP SAFETY BODY (MISCELLANEOUS) ×3 IMPLANT
SUCTION FRAZIER HANDLE 10FR (MISCELLANEOUS) ×2
SUCTION TUBE FRAZIER 10FR DISP (MISCELLANEOUS) ×1 IMPLANT
SUT DVC 2 QUILL PDO  T11 36X36 (SUTURE) ×2
SUT DVC 2 QUILL PDO T11 36X36 (SUTURE) ×1 IMPLANT
SUT DVC QUILL MONODERM 30X30 (SUTURE) ×3 IMPLANT
SUT ETHIBOND NAB CT1 #1 30IN (SUTURE) ×3 IMPLANT
SYR 20CC LL (SYRINGE) ×3 IMPLANT
SYR 50ML LL SCALE MARK (SYRINGE) ×3 IMPLANT
TOWER CARTRIDGE SMART MIX (DISPOSABLE) ×3 IMPLANT
WATER STERILE IRR 1000ML POUR (IV SOLUTION) ×3 IMPLANT
WRAPON POLAR PAD KNEE (MISCELLANEOUS) ×3

## 2015-01-14 NOTE — Progress Notes (Signed)
Dr. Rudene Christians notified about patient's pain level. Verbalized order of dilaudid 0.5 mg q3h prn if not relieved by morphine.

## 2015-01-14 NOTE — Anesthesia Preprocedure Evaluation (Signed)
Anesthesia Evaluation  Patient identified by MRN, date of birth, ID band Patient awake    Reviewed: Allergy & Precautions, NPO status , Patient's Chart, lab work & pertinent test results, reviewed documented beta blocker date and time   History of Anesthesia Complications Negative for: history of anesthetic complications  Airway Mallampati: II       Dental  (+) Teeth Intact   Pulmonary COPD,  COPD inhaler, former smoker,           Cardiovascular hypertension, Pt. on medications and Pt. on home beta blockers      Neuro/Psych Depression negative neurological ROS     GI/Hepatic Neg liver ROS, PUD, GERD  Medicated and Controlled,(+) Cirrhosis       ,   Endo/Other  diabetes, Type 2, Oral Hypoglycemic Agents  Renal/GU Renal Insufficiency     Musculoskeletal   Abdominal   Peds  Hematology  (+) anemia ,   Anesthesia Other Findings   Reproductive/Obstetrics                             Anesthesia Physical Anesthesia Plan  ASA: III  Anesthesia Plan: Spinal   Post-op Pain Management:    Induction: Intravenous  Airway Management Planned:   Additional Equipment:   Intra-op Plan:   Post-operative Plan:   Informed Consent: I have reviewed the patients History and Physical, chart, labs and discussed the procedure including the risks, benefits and alternatives for the proposed anesthesia with the patient or authorized representative who has indicated his/her understanding and acceptance.     Plan Discussed with:   Anesthesia Plan Comments:         Anesthesia Quick Evaluation

## 2015-01-14 NOTE — Evaluation (Signed)
Physical Therapy Evaluation Patient Details Name: Sheila [name] MRN: [id number] DOB: 03-Jul-1960 Today's Date: 01/14/2015   History of Present Illness  Pt is admitted for R TKR on 01/14/15.  Clinical Impression  Pt is a pleasant 55 year old female who was admitted for R TKR. Pt performs bed mobility with min assist, transfers with min assist, and ambulation with cga and rw. Pt demonstrates deficits with strength/balance/mobility/pain. Pt is able to perform SLRs with cga, does not require KI at this time. Would benefit from skilled PT to address above deficits and promote optimal return to PLOF.     Follow Up Recommendations Home health PT    Equipment Recommendations  Rolling walker with 5" wheels    Recommendations for Other Services       Precautions / Restrictions Precautions Precautions: Fall Restrictions Weight Bearing Restrictions: Yes RLE Weight Bearing: Weight bearing as tolerated      Mobility  Bed Mobility Overal bed mobility: Needs Assistance Bed Mobility: Supine to Sit     Supine to sit: Min assist     General bed mobility comments: cues for sequencing given with assist needed for bringing R LE down to ground and scooting out towards EOB.  Transfers Overall transfer level: Needs assistance Equipment used: Rolling walker (2 wheeled) Transfers: Sit to/from Stand Sit to Stand: Min assist         General transfer comment: transfers performed with rw and assist for initial standing. Pt cued for pushing from seated surface  Ambulation/Gait Ambulation/Gait assistance: Min guard Ambulation Distance (Feet): 3 Feet Assistive device: Rolling walker (2 wheeled) Gait Pattern/deviations: Step-to pattern     General Gait Details: ambulated with slow step to gait pattern. Pt follow commands well and can seuqence tasks with verbal cues. Pt reports increased pain with mobility, further distance deferred at this time  Stairs            Wheelchair  Mobility    Modified Rankin (Stroke Patients Only)       Balance Overall balance assessment: Needs assistance Sitting-balance support: Single extremity supported;Feet supported Sitting balance-Leahy Scale: Good     Standing balance support: Bilateral upper extremity supported Standing balance-Leahy Scale: Good                               Pertinent Vitals/Pain Pain Assessment: 0-10 Pain Score: 9  Pain Location: R LE Pain Descriptors / Indicators: Operative site guarding Pain Intervention(s): Limited activity within patient's tolerance;Patient requesting pain meds-RN notified;Ice applied    Home Living Family/patient expects to be discharged to:: Private residence Living Arrangements: Children;Spouse/significant other Available Help at Discharge: Family Type of Home: House Home Access: Ramped entrance     [place]: [place]: None      Prior Function Level of Independence: Independent         Comments: however recently (since Thrusday) was unable to bear weight on R LE     Hand Dominance        Extremity/Trunk Assessment   Upper Extremity Assessment: Overall WFL for tasks assessed           Lower Extremity Assessment: Generalized weakness (R LE grossly 3/5; able to perform SLR with cga)         Communication   Communication: No difficulties  Cognition Arousal/Alertness: Awake/alert Behavior During Therapy: WFL for tasks assessed/performed Overall Cognitive Status: Within Functional Limits for tasks assessed  General Comments      Exercises Total Joint Exercises Goniometric ROM: R LE AAROM: 5-68 degrees Other Exercises Other Exercises: R LE ther-ex performed including ankle pumps, quad sets, hip abd/add, SLRs, and glut sets. All ther-ex performed x 10 reps with min assist for correct technique      Assessment/Plan    PT Assessment Patient needs continued PT services  PT  Diagnosis Difficulty walking;Abnormality of gait;Generalized weakness;Acute pain   PT Problem List Decreased strength;Decreased range of motion;Decreased activity tolerance;Decreased balance;Decreased mobility;Pain  PT Treatment Interventions Gait training;Therapeutic exercise   PT Goals (Current goals can be found in the Care Plan section) Acute Rehab PT Goals Patient Stated Goal: to go home PT Goal Formulation: With patient Time For Goal Achievement: 01/28/15 Potential to Achieve Goals: Good    Frequency BID   Barriers to discharge        Co-evaluation               End of Session Equipment Utilized During Treatment: Gait belt Activity Tolerance: Patient limited by pain Patient left: in chair;with chair alarm set;with family/visitor present Nurse Communication: Mobility status         Time: 5790-3833 PT Time Calculation (min) (ACUTE ONLY): 30 min   Charges:   PT Evaluation $PT Eval Moderate Complexity: 1 Procedure PT Treatments $Therapeutic Exercise: 8-22 mins   PT G Codes:        Samia Kukla 02-03-2015, 5:18 PM Greggory Stallion, PT, DPT [phone]

## 2015-01-14 NOTE — H&P (Signed)
Reviewed paper H+P, will be scanned into chart. No changes noted.  

## 2015-01-14 NOTE — Care Management (Signed)
Patient received from PACU today. List of home health agencies delivered to patient. She has Medicaid which is a set number of PT visits based on joint replacement. This includes outpatient visits. SNF would be paid for for 30 days (minimum) which is room and board and may not include PT. The facility may not be in [facility].  DME will be covered. Brand name Lovenox will be covered ($4 co-pay). PT pending. I will follow up with patient/family tomorrow.

## 2015-01-14 NOTE — Anesthesia Procedure Notes (Signed)
Spinal Patient location during procedure: OR Start time: 01/14/2015 8:45 AM End time: 01/14/2015 8:55 AM Staffing Performed by: anesthesiologist  Preanesthetic Checklist Completed: patient identified, site marked, surgical consent, pre-op evaluation, timeout performed, IV checked, risks and benefits discussed and monitors and equipment checked Spinal Block Patient position: sitting Prep: Betadine Patient monitoring: heart rate, continuous pulse ox, blood pressure and cardiac monitor Approach: midline Location: L4-5 Injection technique: single-shot Needle Needle type: Whitacre and Introducer  Needle gauge: 25 G Needle length: 9 cm Additional Notes Negative paresthesia. Negative blood return. Positive free-flowing CSF. Expiration date of kit checked and confirmed. Patient tolerated procedure well, without complications.

## 2015-01-14 NOTE — Op Note (Signed)
01/14/2015  11:19 AM  PATIENT:  Sheila [name]  55 y.o. female  PRE-OPERATIVE DIAGNOSIS:  DEGENERATIVE OSTEOARTHRITIS RIGHT KNEE  POST-OPERATIVE DIAGNOSIS:  Degenerative osteoarthritis right knee  PROCEDURE:  Procedure(s): TOTAL KNEE ARTHROPLASTY (Right)  SURGEON: Laurene Footman, MD  ASSISTANTS: Rachelle Hora [facility]  ANESTHESIA:   spinal  EBL:  Total I/O In: 1000 [I.V.:1000] Out: 270 [Urine:170; Blood:100]  BLOOD ADMINISTERED:none  DRAINS: none   LOCAL MEDICATIONS USED:  MARCAINE    and OTHER morphine, exparel  SPECIMEN:  Source of Specimen:  Cut ends of bone  DISPOSITION OF SPECIMEN:  PATHOLOGY  COUNTS:  YES  TOURNIQUET:   88 minutes at 300 mmHg  IMPLANTS: Medacta GMK sphere 2+ femur, 2 tibia with 11 mm insert, short stem and 1 patella all components cemented  DICTATION: .Dragon Dictation patient brought the operating room and after adequate spinal anesthesia was obtained the right leg was prepped and draped in sterile fashion. Patient education timeout procedure completed midline skin incision was made with a tourniquet raised to 300 mmHg. Medial parapatellar arthrotomy performed and inspection revealed moderate synovitis with portion of synovium excised. The medial compartment had eburnated bone on the tibia and femur and extensive wear of the patellofemoral joint with lateral compartment relatively spared. The patellar fat pad and anterior cruciate ligament excised. The proximal tibia was supposed for application tibia cutting guide and tibia cutting block applied and 2 proximal tibia resection carried out. The femur was approached a similar fashion and distal femoral cut made to 2+ 4-in-1 cutting guide was applied anterior posterior and chamfer cuts made. The trials were then placed with a 2 tibia and 2+ femur 12 mm insert gave good stability. The distal femoral drill holes were then made and trochlear groove cut performed. The patella was cut with a*cutting guide and was  sized to size 1 after thorough irrigation of the bony surfaces the components were cemented and after infiltrating the above local anesthetic for postop analgesia. With excess cement removed the final implant insert was placed and set screw inserted. Patella tracked well and the tourniquet is let down prior to closure wound was thoroughly irrigated and heavy Quill placed followed by to a Quill substantially followed by skin staples. Xeroform 4 x 4 web roll and Ace wrap applied along Polar Care  PLAN OF CARE: Admit to inpatient   PATIENT DISPOSITION:  PACU - hemodynamically stable.

## 2015-01-14 NOTE — Progress Notes (Signed)
X ray done in PACU

## 2015-01-14 NOTE — Transfer of Care (Signed)
Immediate Anesthesia Transfer of Care Note  Patient: Sheila [name]  Procedure(s) Performed: Procedure(s): TOTAL KNEE ARTHROPLASTY (Right)  Patient Location: PACU  Anesthesia Type:Spinal  Level of Consciousness: sedated and patient cooperative  Airway & Oxygen Therapy: Patient Spontanous Breathing and Patient connected to nasal cannula oxygen  Post-op Assessment: Report given to RN and Post -op Vital signs reviewed and stable  Post vital signs: Reviewed and stable  Last Vitals:  Filed Vitals:   01/14/15 0713 01/14/15 1116  BP: 125/69 87/51  Pulse: 79 90  Temp: 36.8 C 36.1 C  Resp: 16 14    Complications: No apparent anesthesia complications

## 2015-01-15 LAB — BASIC METABOLIC PANEL
ANION GAP: 8 (ref 5–15)
BUN: 10 mg/dL (ref 6–20)
CALCIUM: 8.3 mg/dL — AB (ref 8.9–10.3)
CHLORIDE: 98 mmol/L — AB (ref 101–111)
CO2: 29 mmol/L (ref 22–32)
CREATININE: 0.52 mg/dL (ref 0.44–1.00)
GFR calc Af Amer: >60 mL/min (ref >60)
GFR calc non Af Amer: >60 mL/min (ref >60)
GLUCOSE: 194 mg/dL — AB (ref 65–99)
Potassium: 4.5 mmol/L (ref 3.5–5.1)
Sodium: 135 mmol/L (ref 135–145)

## 2015-01-15 LAB — GLUCOSE, CAPILLARY
GLUCOSE-CAPILLARY: 179 mg/dL — AB (ref 65–99)
Glucose-Capillary: 204 mg/dL — ABNORMAL HIGH (ref 65–99)
Glucose-Capillary: 174 mg/dL — ABNORMAL HIGH (ref 65–99)
Glucose-Capillary: 151 mg/dL — ABNORMAL HIGH (ref 65–99)

## 2015-01-15 LAB — CBC
HEMATOCRIT: 36 % (ref 35.0–47.0)
HEMOGLOBIN: 11.6 g/dL — AB (ref 12.0–16.0)
MCH: 24.8 pg — ABNORMAL LOW (ref 26.0–34.0)
MCHC: 32.4 g/dL (ref 32.0–36.0)
MCV: 76.6 fL — AB (ref 80.0–100.0)
Platelets: 353 10*3/uL (ref 150–440)
RBC: 4.69 MIL/uL (ref 3.80–5.20)
RDW: 14.3 % (ref 11.5–14.5)
WBC: 12.5 10*3/uL — ABNORMAL HIGH (ref 3.6–11.0)

## 2015-01-15 MED ORDER — TRAMADOL HCL 50 MG PO TABS
50.0000 mg | ORAL_TABLET | Freq: Four times a day (QID) | ORAL | Status: DC
  Administered 2015-01-15 – 2015-01-16 (×5): 50 mg via ORAL
  Filled 2015-01-15 (×5): qty 1

## 2015-01-15 NOTE — Progress Notes (Signed)
Physical Therapy Treatment Patient Details Name: Sheila [name] MRN: [id number] DOB: 02-19-1960 Today's Date: 01/15/2015    History of Present Illness Pt is admitted for R TKR on 01/14/15.    PT Comments    Pt is making limited progress towards goals, however requires increased assist for mobility this date. Pt also with dizziness with ambulation, requiring 1 seated rest break. Pt with very slow antalgic gait pattern and has limited ability to weight bear on R LE, with short step length. Pt with good endurance with hep. Would recommend SNF at this time as pt still requires significant assist for mobility. Will continue to reassess.   Follow Up Recommendations  SNF     Equipment Recommendations  Rolling walker with 5" wheels    Recommendations for Other Services       Precautions / Restrictions Precautions Precautions: Fall Restrictions Weight Bearing Restrictions: Yes RLE Weight Bearing: Weight bearing as tolerated    Mobility  Bed Mobility Overal bed mobility: Needs Assistance Bed Mobility: Supine to Sit     Supine to sit: Mod assist     General bed mobility comments: assist for scooting towards EOB and trunk support. Pt able to follow commands for correct technique. Once seated at EOB, pt able to sit with cga  Transfers Overall transfer level: Needs assistance Equipment used: Rolling walker (2 wheeled) Transfers: Sit to/from Stand Sit to Stand: Mod assist         General transfer comment: rw used for transfer with cues for pushing from seated surface. Once standing, pt able to stand with min assist.  Ambulation/Gait Ambulation/Gait assistance: Min assist Ambulation Distance (Feet): 15 Feet Assistive device: Rolling walker (2 wheeled) Gait Pattern/deviations: Step-to pattern     General Gait Details: ambulated using step to gait pattern. Pt becomes dizzy with ambulation, limiting further ambulation. 1 seated rest break noted. Very antalgic gait pattern  performed with limited WB on R LE.    Stairs            Wheelchair Mobility    Modified Rankin (Stroke Patients Only)       Balance                                    Cognition Arousal/Alertness: Awake/alert Behavior During Therapy: WFL for tasks assessed/performed Overall Cognitive Status: Within Functional Limits for tasks assessed                      Exercises Total Joint Exercises Goniometric ROM: R knee AAROM: 3-77 degrees Other Exercises Other Exercises: R LE ther-ex performed including ankle pumps, quad sets, SLRs, hip abd/add, knee flexion stretches, and LAQ. All ther-ex performed x 12 reps with min assist for completion.    General Comments        Pertinent Vitals/Pain Pain Assessment: 0-10 Pain Score: 6  Pain Location: R knee Pain Descriptors / Indicators: Aching;Operative site guarding Pain Intervention(s): Limited activity within patient's tolerance;Ice applied    Home Living Family/patient expects to be discharged to:: Private residence Living Arrangements: Children;Spouse/significant other Available Help at Discharge: Family Type of Home: House Home Access: Ramped entrance   [place]: [place]: None      Prior Function Level of Independence: Independent      Comments: Last Thursday, unable to bear weight on R LE   PT Goals (current goals can now be found in the care  plan section) Acute Rehab PT Goals Patient Stated Goal: to go home PT Goal Formulation: With patient Time For Goal Achievement: 01/28/15 Potential to Achieve Goals: Good Progress towards PT goals: Progressing toward goals    Frequency  BID    PT Plan Discharge plan needs to be updated    Co-evaluation             End of Session Equipment Utilized During Treatment: Gait belt Activity Tolerance: Patient limited by pain Patient left: in chair;with chair alarm set;with family/visitor present     Time: 0726-0804 PT  Time Calculation (min) (ACUTE ONLY): 38 min  Charges:  $Gait Training: 23-37 mins $Therapeutic Exercise: 8-22 mins                    G Codes:      Jaysa Kise 01-27-2015, 11:22 AM  Greggory Stallion, PT, DPT [phone]

## 2015-01-15 NOTE — Care Management Note (Signed)
Case Management Note  Patient Details  Name: Sheila [name] MRN: [id number] Date of Birth: 1960-08-20  Subjective/Objective:                  Met with patient and her daughter to discuss discharge planning. Patient/daughter want to go home. She will need bedside commode and rolling walker. She uses [facility] for Rx : [phone]. She would like to use [facility] for home health PT.   Action/Plan: List of home health agencies left with patient. Referral called to [facility] with [place] care. I have requested bedside commode and rolling walker from Will with [facility]. Lovenox 60m (Brand name only) #14 called [facility] to [facility]. Cost will be $4. RNCM will continue to follow.    Expected Discharge Date:                  Expected Discharge Plan:     In-House Referral:     Discharge planning Services  CM Consult  Post Acute Care Choice:  Durable Medical Equipment, Home Health Choice offered to:  Patient  DME Arranged:  Walker rolling, 3-N-1 [place]  [facility] Status of Service:  In process, will continue to follow  Medicare Important Message Given:    Date Medicare IM Given:    Medicare IM give by:    Date Additional Medicare IM Given:    Additional Medicare Important Message give by:     If discussed at [place] Meet[place], dates discussed:    Additional Comments:  AMarshell Garfinkel RN 01/15/2015, 11:10 AM

## 2015-01-15 NOTE — Anesthesia Postprocedure Evaluation (Signed)
Anesthesia Post Note  Patient: Sheila [name]  Procedure(s) Performed: Procedure(s) (LRB): TOTAL KNEE ARTHROPLASTY (Right)  Patient location during evaluation: Nursing Unit Anesthesia Type: Spinal Level of consciousness: awake and alert and oriented Pain management: pain level controlled Vital Signs Assessment: vitals unstable and post-procedure vital signs reviewed and stable Respiratory status: spontaneous breathing and nonlabored ventilation Cardiovascular status: blood pressure returned to baseline and stable Postop Assessment: no headache, no backache and patient able to bend at knees Anesthetic complications: no    Last Vitals:  Filed Vitals:   01/14/15 2009 01/15/15 0523  BP: 139/71 147/61  Pulse: 85 97  Temp: 36.4 C 37.1 C  Resp: 19 19    Last Pain:  Filed Vitals:   01/15/15 0648  PainSc: Asleep                 Johnna Acosta

## 2015-01-15 NOTE — Progress Notes (Signed)
   Subjective: 1 Day Post-Op Procedure(s) (LRB): TOTAL KNEE ARTHROPLASTY (Right) Patient reports pain as 8 on 0-10 scale.   Patient is well, and has had no acute complaints or problems We will start therapy today.  Plan is to go Home after hospital stay.  Objective: Vital signs in last 24 hours: Temp:  [97 F (36.1 C)-99.9 F (37.7 C)] 98.8 F (37.1 C) (01/11 0523) Pulse Rate:  [63-97] 97 (01/11 0523) Resp:  [14-19] 19 (01/11 0523) BP: (87-147)/(51-82) 147/61 mmHg (01/11 0523) SpO2:  [97 %-100 %] 100 % (01/11 0523)  Intake/Output from previous day: 01/10 0701 - 01/11 0700 In: 2535 [P.O.:840; I.V.:1695] Out: 1855 [Urine:1755; Blood:100] Intake/Output this shift: Total I/O In: -  Out: 200 [Urine:200]   Recent Labs  01/14/15 0743 01/14/15 1230 01/15/15 0346  HGB 14.6 11.9* 11.6*    Recent Labs  01/14/15 1230 01/15/15 0346  WBC 7.1 12.5*  RBC 4.88 4.69  HCT 37.2 36.0  PLT 354 353    Recent Labs  01/14/15 0743 01/14/15 1230 01/15/15 0346  NA 140  --  135  K 3.0*  --  4.5  CL  --   --  98*  CO2  --   --  29  BUN  --   --  10  CREATININE  --  0.47 0.52  GLUCOSE 204*  --  194*  CALCIUM  --   --  8.3*   No results for input(s): LABPT, INR in the last 72 hours.  EXAM General - Patient is Alert, Appropriate and Oriented Extremity - Sensation intact distally Intact pulses distally Dorsiflexion/Plantar flexion intact Dressing - dressing C/D/I and no drainage Motor Function - intact, moving foot and toes well on exam.   Past Medical History  Diagnosis Date  . Hypertension   . Arthritis   . Anemia   . Diabetes mellitus without complication ([place])     on metformin  . 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[place]) 22/4497  . Fatty liver   . Ulcerative colitis ([place])     on mesalamine  . COPD (chronic obstructive pulmonary disease) ([place])   . GERD (gastroesophageal reflux disease)   . Hyperlipidemia   . Glaucoma   . Renal disorder     Assessment/Plan:   1 Day Post-Op  Procedure(s) (LRB): TOTAL KNEE ARTHROPLASTY (Right) Active Problems:   Primary osteoarthritis of right knee  Estimated body mass index is 34.18 kg/(m^2) as calculated from the following:   Height as of 01/09/15: 5' (1.524 m).   Weight as of 11/22/14: 79.379 kg (175 lb). Advance diet Up with therapy  Needs BM Recheck labs in the am Start Ultram, alternate with oxycodone   DVT Prophylaxis - Lovenox, Foot Pumps and TED hose Weight-Bearing as tolerated to right leg D/C O2 and Pulse OX and try on Room Air  T. Rachelle Hora, PA-C [place] 01/15/2015, 7:54 AM

## 2015-01-15 NOTE — Progress Notes (Signed)
Physical Therapy Treatment Patient Details Name: Sheila [name] MRN: [id number] DOB: [birth year] Today's Date: 01/15/2015    History of Present Illness Pt is admitted for R TKR on 01/14/15.    PT Comments    Pt is making good progress towards goals with increased ambulation this date. Pt is motivated to participate and no dizziness noted this session. Pt still with slow gait speed and antalgic gait pattern. Needs to continue to work on [place] and consistent reciprocal gait pattern. Per discussion with family, pt will be safe to dc home at time of discharge as pt is steadily improving. Good endurance with therex this date.  Follow Up Recommendations  Home health PT     Equipment Recommendations  Rolling walker with 5" wheels    Recommendations for Other Services       Precautions / Restrictions Precautions Precautions: Fall Restrictions Weight Bearing Restrictions: Yes RLE Weight Bearing: Weight bearing as tolerated    Mobility  Bed Mobility Overal bed mobility: Needs Assistance Bed Mobility: Supine to Sit     Supine to sit: Min assist     General bed mobility comments: Improved assist for scooting towards EOB and pt able to manage trunk mobility.  Transfers Overall transfer level: Needs assistance Equipment used: Rolling walker (2 wheeled) Transfers: Sit to/from Stand Sit to Stand: Min assist         General transfer comment: Improved technique performed for transfer with use of shorter rw. Pt cued for hand placement  Ambulation/Gait Ambulation/Gait assistance: Min guard Ambulation Distance (Feet): 50 Feet Assistive device: Rolling walker (2 wheeled) Gait Pattern/deviations: Step-to pattern     General Gait Details: ambulated with step to gait pattern with cues for symmetrical step length. Pt steps too close to rw with cues to step in middle of rw. Pt able to perform reciprocal gait pattern with cues, however not consistently.   Stairs             Wheelchair Mobility    Modified Rankin (Stroke Patients Only)       Balance                                    Cognition Arousal/Alertness: Awake/alert Behavior During Therapy: WFL for tasks assessed/performed Overall Cognitive Status: Within Functional Limits for tasks assessed                      Exercises Total Joint Exercises Goniometric ROM: R knee AAROM: 3-77 degrees Other Exercises Other Exercises: R LE therex performed including ankle pumps, quad sets, SLRs, hip abd/add, and SAQ. All ther-ex performed x 12 reps with cues and min assist for sequencing.    General Comments        Pertinent Vitals/Pain Pain Assessment: 0-10 Pain Score: 3  Pain Location: R knee Pain Descriptors / Indicators: Operative site guarding Pain Intervention(s): Limited activity within patient's tolerance    Home Living Family/patient expects to be discharged to:: Private residence Living Arrangements: Children;Spouse/significant other Available Help at Discharge: Family Type of Home: House Home Access: Ramped entrance   Home Layout: One level Home Equipment: None      Prior Function Level of Independence: Independent      Comments: Last Thursday, unable to bear weight on R LE   PT Goals (current goals can now be found in the care plan section) Acute Rehab PT Goals Patient Stated Goal: to go home  PT Goal Formulation: With patient Time For Goal Achievement: 01/28/15 Potential to Achieve Goals: Good Progress towards PT goals: Progressing toward goals    Frequency  BID    PT Plan Discharge plan needs to be updated    Co-evaluation             End of Session Equipment Utilized During Treatment: Gait belt Activity Tolerance: Patient limited by pain Patient left: in bed;with bed alarm set;with SCD's reapplied     Time: 1317-1340 PT Time Calculation (min) (ACUTE ONLY): 23 min  Charges:  $Gait Training: 8-22 mins $Therapeutic Exercise:  8-22 mins                    G Codes:      Sheila [name] 01/17/2015, 2:07 PM  Greggory Stallion, PT, DPT [phone]

## 2015-01-15 NOTE — Evaluation (Signed)
Occupational Therapy Evaluation Patient Details Name: Sheila [name] MRN: [id number] DOB: 02-18-1960 Today's Date: 01/15/2015    History of Present Illness Pt is admitted for R TKR on 01/14/15.   Clinical Impression   This patient is a 55 year old female who came to [facility] for a R total knee replacement.  Patient lives in a one story home with her boyfriend and a ramp entrance.  She had been independent with ADL and functional mobility until last Thursday when she could not bear weight on her right leg. She shows deficits with pain, mobility, and activities of daily living. She now requires assistance for activities of daily living and would benefit from Occupational Therapy for ADL/functional mobility training.     Follow Up Recommendations       Equipment Recommendations       Recommendations for Other Services       Precautions / Restrictions Precautions Precautions: Fall Restrictions Weight Bearing Restrictions: Yes RLE Weight Bearing: Weight bearing as tolerated      Mobility Bed Mobility                  Transfers                      Balance                                            ADL                                         General ADL Comments: Patient had been independent with ADL and function mobility. Patient reports not having clothing and not wanting to sit up, but agreed to practice lower body dressing techniques from bed. Practiced techniques for Donned/doffed socks and pants to knees from supine with minimal assist and physical and verbal cues.     Vision     Perception     Praxis      Pertinent Vitals/Pain Pain Score: 8  Pain Location: R Knee     Hand Dominance     Extremity/Trunk Assessment Upper Extremity Assessment Upper Extremity Assessment: Overall WFL for tasks assessed   Lower Extremity Assessment Lower Extremity Assessment: Defer to PT  evaluation       Communication Communication Communication: No difficulties   Cognition Arousal/Alertness: Awake/alert Behavior During Therapy: WFL for tasks assessed/performed Overall Cognitive Status: Within Functional Limits for tasks assessed                     General Comments       Exercises       Shoulder Instructions      Home Living Family/patient expects to be discharged to:: Private residence Living Arrangements: Children;Spouse/significant other (boyfriend). Available Help at Discharge: Family Type of Home: House Home Access: Ramped entrance     Home Layout: One level               Home Equipment: None          Prior Functioning/Environment Level of Independence: Independent        Comments: Last Thursday, unable to bear weight on R LE    OT Diagnosis: Acute pain   OT Problem List: Decreased strength;Decreased range of  motion;Decreased activity tolerance;Impaired balance (sitting and/or standing);Decreased knowledge of use of DME or AE;Pain   OT Treatment/Interventions: Self-care/ADL training    OT Goals(Current goals can be found in the care plan section) Acute Rehab OT Goals Patient Stated Goal: to go home OT Goal Formulation: With patient/family Time For Goal Achievement: 01/29/15 Potential to Achieve Goals: Good  OT Frequency: Min 1X/week   Barriers to D/C:            Co-evaluation              End of Session Equipment Utilized During Treatment:  (hip kit). Given written list of vendors.   Activity Tolerance: Patient limited by pain Patient left: in bed;with call bell/phone within reach;with bed alarm set;with family/visitor present   Time: 1000-1015 OT Time Calculation (min): 15 min Charges:  OT General Charges $OT Visit: 1 Procedure OT Evaluation $OT Eval Low Complexity: 1 Procedure G-Codes:    Myrene Galas, MS/OTR/L  01/15/2015, 10:25 AM

## 2015-01-15 NOTE — Clinical Social Work Note (Signed)
Clinical Social Worker consulted for [employer]. PT initially recommending HHPT, then the recommendation was changed. CSW met with pt and family. Pt and family were felt strongly about about pt returning home. Pt's daughter is a CNA and stated that she provided the needed care. Pt and family were in agreement for pt returning home. CSW notified RNCM of discharge plan. CSW is signing off as no further needs identified.   Darden Dates, MSW, LCSW Clinical Social Worker  [phone]

## 2015-01-16 ENCOUNTER — Inpatient Hospital Stay: Payer: Medicaid Other

## 2015-01-16 ENCOUNTER — Encounter: Payer: Self-pay | Admitting: Radiology

## 2015-01-16 ENCOUNTER — Inpatient Hospital Stay
Admission: RE | Admit: 2015-01-16 | Discharge: 2015-01-16 | Disposition: A | Discharge status: Home / Self Care | Payer: Medicaid Other | Source: Ambulatory Visit | Attending: Internal Medicine | Admitting: Internal Medicine

## 2015-01-16 LAB — CBC
HCT: 33.5 % — ABNORMAL LOW (ref 35.0–47.0)
Hemoglobin: 11 g/dL — ABNORMAL LOW (ref 12.0–16.0)
MCH: 24.4 pg — AB (ref 26.0–34.0)
MCHC: 32.8 g/dL (ref 32.0–36.0)
MCV: 74.5 fL — AB (ref 80.0–100.0)
PLATELETS: 356 10*3/uL (ref 150–440)
RBC: 4.49 MIL/uL (ref 3.80–5.20)
RDW: 13.8 % (ref 11.5–14.5)
WBC: 17.1 10*3/uL — ABNORMAL HIGH (ref 3.6–11.0)

## 2015-01-16 LAB — BASIC METABOLIC PANEL
Anion gap: 9 (ref 5–15)
BUN: 8 mg/dL (ref 6–20)
CALCIUM: 8.5 mg/dL — AB (ref 8.9–10.3)
CO2: 27 mmol/L (ref 22–32)
CREATININE: 0.46 mg/dL (ref 0.44–1.00)
Chloride: 94 mmol/L — ABNORMAL LOW (ref 101–111)
GFR calc Af Amer: >60 mL/min (ref >60)
GLUCOSE: 195 mg/dL — AB (ref 65–99)
POTASSIUM: 4 mmol/L (ref 3.5–5.1)
SODIUM: 130 mmol/L — AB (ref 135–145)

## 2015-01-16 LAB — URINALYSIS COMPLETE WITH MICROSCOPIC (ARMC ONLY)
BACTERIA UA: NONE SEEN
Bilirubin Urine: NEGATIVE
GLUCOSE, UA: NEGATIVE mg/dL
HGB URINE DIPSTICK: NEGATIVE
LEUKOCYTES UA: NEGATIVE
Nitrite: NEGATIVE
PH: 6 (ref 5.0–8.0)
PROTEIN: NEGATIVE mg/dL
Specific Gravity, Urine: 1.051 — ABNORMAL HIGH (ref 1.005–1.030)

## 2015-01-16 LAB — GLUCOSE, CAPILLARY
GLUCOSE-CAPILLARY: 189 mg/dL — AB (ref 65–99)
GLUCOSE-CAPILLARY: 168 mg/dL — AB (ref 65–99)
Glucose-Capillary: 192 mg/dL — ABNORMAL HIGH (ref 65–99)
Glucose-Capillary: 145 mg/dL — ABNORMAL HIGH (ref 65–99)

## 2015-01-16 LAB — SODIUM
SODIUM: 128 mmol/L — AB (ref 135–145)
Sodium: 131 mmol/L — ABNORMAL LOW (ref 135–145)

## 2015-01-16 LAB — SURGICAL PATHOLOGY

## 2015-01-16 LAB — TSH: TSH: 0.621 u[IU]/mL (ref 0.350–4.500)

## 2015-01-16 MED ORDER — DIAZEPAM 5 MG PO TABS
5.0000 mg | ORAL_TABLET | Freq: Four times a day (QID) | ORAL | Status: DC | PRN
  Administered 2015-01-16 – 2015-01-17 (×2): 5 mg via ORAL
  Filled 2015-01-16 (×2): qty 1

## 2015-01-16 MED ORDER — METOPROLOL SUCCINATE ER 25 MG PO TB24
25.0000 mg | ORAL_TABLET | Freq: Every day | ORAL | Status: DC
  Administered 2015-01-17: 25 mg via ORAL
  Filled 2015-01-16: qty 1

## 2015-01-16 MED ORDER — IOHEXOL 350 MG/ML SOLN
75.0000 mL | Freq: Once | INTRAVENOUS | Status: AC | PRN
  Administered 2015-01-16: 75 mL via INTRAVENOUS

## 2015-01-16 MED ORDER — SODIUM CHLORIDE 0.9 % IV SOLN
INTRAVENOUS | Status: DC
  Administered 2015-01-16 – 2015-01-17 (×3): via INTRAVENOUS

## 2015-01-16 MED ORDER — OXYCODONE HCL 5 MG PO TABS
5.0000 mg | ORAL_TABLET | ORAL | Status: DC | PRN
  Administered 2015-01-17 (×2): 5 mg via ORAL
  Filled 2015-01-16 (×2): qty 1

## 2015-01-16 MED ORDER — METOPROLOL SUCCINATE ER 25 MG PO TB24
12.5000 mg | ORAL_TABLET | Freq: Once | ORAL | Status: AC
Start: 2015-01-16 — End: 2015-01-16
  Administered 2015-01-16: 12.5 mg via ORAL
  Filled 2015-01-16: qty 1

## 2015-01-16 MED ORDER — DILTIAZEM HCL 25 MG/5ML IV SOLN
10.0000 mg | Freq: Once | INTRAVENOUS | Status: AC
  Administered 2015-01-16: 10 mg via INTRAVENOUS
  Filled 2015-01-16: qty 5

## 2015-01-16 NOTE — Progress Notes (Signed)
Spoke with Dr.Mody regarding pt's sodium level of 128. New orders received for sodium recheck at 2100 and start iv fluids normal saline at 100 ml/hr.

## 2015-01-16 NOTE — Progress Notes (Signed)
Physical Therapy Treatment Patient Details Name: Sheila [name] MRN: [id number] DOB: April 21, 1960 Today's Date: 01/16/2015    History of Present Illness Pt is admitted for R TKR on 01/14/15.    PT Comments    Pt with fair tolerance to session, performed ambulation during session, per nsg HR increased to 160.  Pt returned to chair and then returned to bed, HR remained in 130s.  During ambulation pt demonstrated short step length with step through pattern. Encouraged pt to increase wt through RLE during ambulation as demonstrated increased UE use with RW.    Follow Up Recommendations  Home health PT     Equipment Recommendations  Rolling walker with 5" wheels    Recommendations for Other Services       Precautions / Restrictions Precautions Precautions: Fall Restrictions Weight Bearing Restrictions: Yes RLE Weight Bearing: Weight bearing as tolerated    Mobility  Bed Mobility Overal bed mobility: Needs Assistance Bed Mobility: Sit to Supine     Supine to sit: Min assist     General bed mobility comments: assist for LE placement   Transfers Overall transfer level: Needs assistance Equipment used: Rolling walker (2 wheeled) Transfers: Sit to/from Stand Sit to Stand: Min assist         General transfer comment: Reforcement for cues for PHP  Ambulation/Gait Ambulation/Gait assistance: Min guard Ambulation Distance (Feet): 20 Feet Assistive device: Rolling walker (2 wheeled) Gait Pattern/deviations: Step-through pattern;Decreased step length - right;Decreased stance time - right;Decreased weight shift to right     General Gait Details: Small step length, foot flat  despite cues, heavy use of UE    Stairs            Wheelchair Mobility    Modified Rankin (Stroke Patients Only)       Balance Overall balance assessment: Needs assistance Sitting-balance support: Bilateral upper extremity supported Sitting balance-Leahy Scale: Good     Standing  balance support: Bilateral upper extremity supported Standing balance-Leahy Scale: Good                      Cognition Arousal/Alertness: Awake/alert Behavior During Therapy: WFL for tasks assessed/performed Overall Cognitive Status: Within Functional Limits for tasks assessed                      Exercises      General Comments        Pertinent Vitals/Pain Pain Assessment: 0-10 Pain Score: 6  Pain Location: R knee Pain Descriptors / Indicators: Aching Pain Intervention(s): Limited activity within patient's tolerance    Home Living                      Prior Function            PT Goals (current goals can now be found in the care plan section) Acute Rehab PT Goals Patient Stated Goal: to go home PT Goal Formulation: With patient Time For Goal Achievement: 01/28/15 Potential to Achieve Goals: Good Progress towards PT goals: Progressing toward goals    Frequency  BID    PT Plan      Co-evaluation             End of Session Equipment Utilized During Treatment: Gait belt Activity Tolerance: Treatment limited secondary to medical complications (Comment) (During ambulation pt HR increased to 160, returned to bed. ) Patient left: in bed;with bed alarm set;with family/visitor present     Time: 1435-1500  PT Time Calculation (min) (ACUTE ONLY): 25 min  Charges:  $Gait Training: 8-22 mins $Therapeutic Activity: 8-22 mins                    G Codes:      Kristina Mcnorton 01/29/2015, 3:33 PM  Rayfield Beem, PTA

## 2015-01-16 NOTE — Progress Notes (Signed)
Unable to place patient on heart monitor - no monitor available on the unit. Charge Nurse aware.  Will continue to monitor the patient.

## 2015-01-16 NOTE — Progress Notes (Signed)
   Subjective: 2 Days Post-Op Procedure(s) (LRB): TOTAL KNEE ARTHROPLASTY (Right) Patient reports pain as 8 on 0-10 scale.   Patient is well, and has had no acute complaints or problems  No CP,SOB, Abd pain, UA symptoms We will continue therapy today.  Plan is to go Home after hospital stay.  Objective: Vital signs in last 24 hours: Temp:  [98.2 F (36.8 C)-98.5 F (36.9 C)] 98.4 F (36.9 C) (01/12 0511) Pulse Rate:  [103-128] 115 (01/12 0511) Resp:  [17-19] 19 (01/12 0511) BP: (135-153)/(67-85) 135/72 mmHg (01/12 0511) SpO2:  [94 %-100 %] 96 % (01/12 0511) Weight:  [87.454 kg (192 lb 12.8 oz)] 87.454 kg (192 lb 12.8 oz) (01/11 1153)  Intake/Output from previous day: 01/11 0701 - 01/12 0700 In: 842.5 [P.O.:480; I.V.:362.5] Out: 1550 [Urine:1550] Intake/Output this shift:     Recent Labs  01/14/15 0743 01/14/15 1230 01/15/15 0346 01/16/15 0550  HGB 14.6 11.9* 11.6* 11.0*    Recent Labs  01/15/15 0346 01/16/15 0550  WBC 12.5* 17.1*  RBC 4.69 4.49  HCT 36.0 33.5*  PLT 353 356    Recent Labs  01/15/15 0346 01/16/15 0550  NA 135 130*  K 4.5 4.0  CL 98* 94*  CO2 29 27  BUN 10 8  CREATININE 0.52 0.46  GLUCOSE 194* 195*  CALCIUM 8.3* 8.5*   No results for input(s): LABPT, INR in the last 72 hours.  EXAM General - Patient is Alert, Appropriate and Oriented Extremity - Sensation intact distally Intact pulses distally Dorsiflexion/Plantar flexion intact Dressing - dressing C/D/I, new dressing applied Motor Function - intact, moving foot and toes well on exam.   Past Medical History  Diagnosis Date  . Hypertension   . Arthritis   . Anemia   . Diabetes mellitus without complication ([place])     on metformin  . 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[place]) 16/6063  . Fatty liver   . Ulcerative colitis ([place])     on mesalamine  . COPD (chronic obstructive pulmonary disease) ([place])   . GERD (gastroesophageal reflux disease)   . Hyperlipidemia   . Glaucoma   . Renal  disorder     Assessment/Plan:   2 Days Post-Op Procedure(s) (LRB): TOTAL KNEE ARTHROPLASTY (Right) Active Problems:   Primary osteoarthritis of right knee  Estimated body mass index is 36.45 kg/(m^2) as calculated from the following:   Height as of this encounter: 5' 1"  (1.549 m).   Weight as of this encounter: 87.454 kg (192 lb 12.8 oz). Advance diet Up with therapy  Needs BM Recheck labs in the am Fluid restricted diet    DVT Prophylaxis - Lovenox, Foot Pumps and TED hose Weight-Bearing as tolerated to right leg D/C O2 and Pulse OX and try on Room Air  T. Rachelle Hora, PA-C [place] 01/16/2015, 7:20 AM

## 2015-01-16 NOTE — Progress Notes (Addendum)
RRT nurse at the bedside. Patient resting in bed, no complain of chest pain. On tele monitor heart rate 125.  Cardizem 10 mg given by Howie Ill per MD order.

## 2015-01-16 NOTE — Discharge Instructions (Signed)

## 2015-01-16 NOTE — Progress Notes (Signed)
*  PRELIMINARY RESULTS* Echocardiogram 2D Echocardiogram has been performed.  Sheila [name] 01/16/2015, 6:55 PM

## 2015-01-16 NOTE — Progress Notes (Signed)
Lovenox teaching giving to patient and family. Patients daughter demonstrates correct use of Lovenox injection.

## 2015-01-16 NOTE — Progress Notes (Signed)
Pt seen consult note to follow. Pt with asym sinus tach.  Order TSH Inc metoprol Control pain CT chest eval PE Does not seem to be dehydrated drinking a low of fluids Recheck NA this afternoon  Agree with fluid restriction for now Check UA inc WBC this am

## 2015-01-16 NOTE — Progress Notes (Signed)
Patient going for CT per order.

## 2015-01-16 NOTE — Progress Notes (Signed)
Dr.Mody here to see patient for elevated heart rate.

## 2015-01-16 NOTE — Progress Notes (Addendum)
Patient's up and ambulating in hallway with PT, heart rate elevated 130-160. Paged Dr.Mody, waiting for callback.  Order received for cardizem 10 mg iv x 1 now and get echo.

## 2015-01-16 NOTE — Progress Notes (Addendum)
   01/16/15 0940  PT Visit Information  Last PT Received On 01/16/15  Assistance Needed +1  History of Present Illness Pt is admitted for R TKR on 01/14/15.  PT Time Calculation  PT Start Time (ACUTE ONLY) 1100  PT Stop Time (ACUTE ONLY) 1125  PT Time Calculation (min) (ACUTE ONLY) 25 min  Subjective Data  Subjective I feel nauseated and I have a lot of pain in my knee  Patient Stated Goal to go home  Precautions  Precautions Fall  Restrictions  Weight Bearing Restrictions Yes  RLE Weight Bearing WBAT  Pain Assessment  Pain Assessment 0-10  Pain Score 7  Pain Location R knee  Pain Descriptors / Indicators Aching;Burning  Pain Intervention(s) Limited activity within patient's tolerance;RN gave pain meds during session  Cognition  Arousal/Alertness Awake/alert  Behavior During Therapy [facility] for tasks assessed/performed  Overall Cognitive Status Within Functional Limits for tasks assessed  Bed Mobility  Overal bed mobility Needs Assistance  Bed Mobility Supine to Sit  Supine to sit Min assist  General bed mobility comments cues for sequencing  Transfers  Overall transfer level Needs assistance  Equipment used Rolling walker (2 wheeled)  Transfers Sit to/from Stand  Sit to Stand Min assist  General transfer comment Instruction on extending RLE away from bed for pain management with trf  Exercises  Exercises Total Joint  Total Joint Exercises  Ankle Circles/Pumps AROM;Both;20 reps  Quad Sets AROM;Both;20 reps  Gluteal Sets AROM;Both;20 reps  Hip ABduction/ADduction AROM;Both;10 reps  Straight Leg Raises AROM;Right;10 reps  Long Arc Quad AROM;Right;10 reps  Knee Flexion AAROM;Right;5 reps  PT - End of Session  Equipment Utilized During Treatment Gait belt  Activity Tolerance Patient tolerated treatment well  Patient left in bed;with bed alarm set  Nurse Communication Mobility status  PT - Assessment/Plan  PT Frequency (ACUTE ONLY) BID  Follow Up Recommendations Home  health PT  PT equipment Rolling walker with 5" wheels  PT Goal Progression  Progress towards PT goals Progressing toward goals  Acute Rehab PT Goals  PT Goal Formulation With patient  Time For Goal Achievement 01/28/15  Potential to Achieve Goals Good  PT General Charges  $$ ACUTE PT VISIT 1 Procedure  PT Treatments  $Therapeutic Exercise 8-22 mins  $Therapeutic Activity 8-22 mins   Pt performed supine and EOB therex d/t increased HR earlier in am, vitals taken during session by nsg and WNL.  Pt performed all therex are noted above with good tolerance.  In supine RLe noted in ER, educated in attempting to maintain neutral position when resting.  Pt performed sit to/from stand with min guard after instruction for exxtending RLE away from bed, pt with F+/G- standing balance.  She would con't to benefit from skilled PT to increase ROM, and LE strength for improved functional activity tolerance.  Donique Hammonds Ulices Maack, PTA 3:21 PM  Addendum added PTA entry error, treatment time was 1100a not 940a Xayne Brumbaugh, PTA

## 2015-01-16 NOTE — Progress Notes (Signed)
Spoke with Dorise Hiss regarding pt's elevated heart rate elevated 120-121. Order received for EKG. If abnormal place order for hospitalist consult.

## 2015-01-16 NOTE — Consult Note (Signed)
Medical Consultation  Sheila [name]:[id number] DOB: Sep 14, 1960 DOA: 01/14/2015 PCP: Ellamae Sia, MD   Requesting physician: Dr Rudene Christians Date of consultation: 01/16/2015 Reason for consultation: sinus tachcyardia  Impression/Recommendations  55 y/o female POD #2 for right knee replacement with persistent sinus tachycardia.  1. Sinus tachycardia: I am suspecting this is from pain and possible constipation. Her CT scan was negative for PE. I have increased metoprol for BP and HR. Continue telemetry. TSH is pending. Consider ECHO if this is still an issues She does not appear intravascularly dry. Her hemoglobin is stable without significant anemia to cause sinus tachycardia. She does not drink EtOH or use tobacco.  2. Essential hypertension: I will increase metoprolol for better blood pressure control. Continue losartan  3. Diabetes without complication: Patient is on metformin however her blood sugars are elevated. I have consulted diabetes management for further evaluation. Continue siding scale insulin and add ADA diet.  4. Constipation: Patient needs bowel regimen.  5. Hyponatremia: This is suspected to be due to polydipsia. Continue fluid restriction and repeat sodium the second.  Chief Complaint: Sinus tachycardia  HPI:  55 year old female with a history of essential hypertension and diabetes who is postoperative day #2 for right total arthroplasty. Also services consulted for sinus tachycardia. Patient denies EtOH use or IV drug use. Patient denies chest pain or shortness of breath. Patient is currently denying anxiety or pain. Patient does have constipation.  Review of Systems  Constitutional: Negative for fever, chills weight loss HENT: Negative for ear pain, nosebleeds, congestion, facial swelling, rhinorrhea, neck pain, neck stiffness and ear discharge.   Respiratory: Negative for cough, shortness of breath, wheezing  Cardiovascular: Negative for chest pain,  palpitations and leg swelling.  Gastrointestinal: Negative for heartburn, abdominal pain, vomiting, diarrhea ositos consitpation Genitourinary: Negative for dysuria, urgency, frequency, hematuria Musculoskeletal: Negative for back pain or joint pain Neurological: Negative for dizziness, seizures, syncope, focal weakness,  numbness and headaches.  Hematological: Does not bruise/bleed easily.  Psychiatric/Behavioral: Negative for hallucinations, confusion, dysphoric mood   Past Medical History  Diagnosis Date  . Hypertension   . Arthritis   . Anemia   . Diabetes mellitus without complication ([place])     on metformin  . 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[place]) 58/0998  . Fatty liver   . Ulcerative colitis ([place])     on mesalamine  . COPD (chronic obstructive pulmonary disease) ([place])   . GERD (gastroesophageal reflux disease)   . Hyperlipidemia   . Glaucoma   . Renal disorder   . Asthma    Past Surgical History  Procedure Laterality Date  . Total knee arthroplasty Left 2008  . Total knee arthroplasty Right 01/14/2015    Procedure: TOTAL KNEE ARTHROPLASTY;  Surgeon: Hessie Knows, MD;  Location: [facility] ORS;  Service: Orthopedics;  Laterality: Right;   Social History:  reports that she quit smoking about 37 years ago. She does not have any smokeless tobacco history on file. She reports that she does not drink alcohol. Her drug history is not on file.  Allergies  Allergen Reactions  . Ampicillin Anaphylaxis  . Erythromycin Anaphylaxis  . Omeprazole Anaphylaxis  . Penicillins Anaphylaxis   History reviewed. No pertinent family history.  Prior to Admission medications   Medication Sig Start Date End Date Taking? Authorizing Provider  albuterol (PROVENTIL HFA;VENTOLIN HFA) 108 (90 BASE) MCG/ACT inhaler Inhale 2 puffs into the lungs every 4 (four) hours as needed for wheezing or shortness of breath.   Yes Historical Provider, MD  brinzolamide (AZOPT) 1 % ophthalmic suspension Place 1 drop into both  eyes 3 (three) times daily.   Yes Historical Provider, MD  chlorthalidone (HYGROTON) 25 MG tablet Take 25 mg by mouth daily.  01/04/15  Yes Historical Provider, MD  fluticasone (FLONASE) 50 MCG/ACT nasal spray Place 2 sprays into both nostrils daily. 12/25/14  Yes Historical Provider, MD  gabapentin (NEURONTIN) 300 MG capsule Take 1 capsule by mouth 2 (two) times daily. 11/14/14  Yes Historical Provider, MD  KLOR-CON M20 20 MEQ tablet Take 2 tablets by mouth 2 (two) times daily. 01/01/15  Yes Historical Provider, MD  latanoprost (XALATAN) 0.005 % ophthalmic solution Place 1 drop into both eyes at bedtime. 10/01/14  Yes Historical Provider, MD  losartan (COZAAR) 25 MG tablet Take 25 mg by mouth daily.    Yes Historical Provider, MD  mesalamine (APRISO) 0.375 G 24 hr capsule Take 375 mg by mouth daily.    Yes Historical Provider, MD  metoprolol succinate (TOPROL-XL) 25 MG 24 hr tablet Take 0.5 tablets by mouth daily. 01/04/15  Yes Historical Provider, MD  MUCINEX 600 MG 12 hr tablet Take 1 tablet by mouth 2 (two) times daily as needed for cough.  01/03/15  Yes Historical Provider, MD  oxycodone (OXY-IR) 5 MG capsule Take 1 capsule (5 mg total) by mouth every 4 (four) hours as needed. 11/22/14  Yes Pierce Crane Beers, PA-C  pantoprazole (PROTONIX) 40 MG tablet Take 40 mg by mouth every morning.   Yes Historical Provider, MD  pravastatin (PRAVACHOL) 10 MG tablet Take 10 mg by mouth at bedtime.   Yes Historical Provider, MD  ibuprofen (ADVIL,MOTRIN) 800 MG tablet Take 800 mg by mouth every 8 (eight) hours as needed for mild pain.     Historical Provider, MD  levofloxacin (LEVAQUIN) 500 MG tablet Take 1 tablet by mouth daily. Reported on 01/14/2015 01/03/15   Historical Provider, MD  metFORMIN (GLUCOPHAGE-XR) 500 MG 24 hr tablet Take 500 mg by mouth 2 (two) times daily.     Historical Provider, MD    Physical Exam: Blood pressure 149/85, pulse 120, temperature 97.6 F (36.4 C), temperature source Oral,  resp. rate 18, height 5' 1"  (1.549 m), weight 87.454 kg (192 lb 12.8 oz), SpO2 97 %. @VITALS2 @ [employer]   01/15/15 1153  Weight: 87.454 kg (192 lb 12.8 oz)    Intake/Output Summary (Last 24 hours) at 01/16/15 1143 Last data filed at 01/16/15 0946  Gross per 24 hour  Intake    480 ml  Output   1301 ml  Net   -821 ml     Constitutional: Appears well-developed and well-nourished. No distress. HENT: Normocephalic. Marland Kitchen Oropharynx is clear and moist.  Eyes: Conjunctivae and EOM are normal. PERRLA, no scleral icterus.  Neck: Normal ROM. Neck supple. No JVD. No tracheal deviation. CVS: Sinus tachycardia, S1/S2 +, no murmurs, no gallops, no carotid bruit.  Pulmonary: Effort and breath sounds normal, no stridor, rhonchi, wheezes, rales.  Abdominal: Soft. BS +,  no distension, tenderness, rebound or guarding.  Musculoskeletal: Right knee with brace.  Neuro: Alert. CN 2-12 grossly intact. No focal deficits. Skin: Skin is warm and dry. No rash noted. Psychiatric: Normal mood and affect.    Labs  Basic Metabolic Panel:  Recent Labs Lab 01/16/15 0550  NA 130*  K 4.0  CL 94*  CO2 27  GLUCOSE 195*  BUN 8  CREATININE 0.46  CALCIUM 8.5*   Liver Function Tests: No results for input(s): AST, ALT, ALKPHOS,  BILITOT, PROT, ALBUMIN in the last 168 hours. No results for input(s): LIPASE, AMYLASE in the last 168 hours.  CBC:  Recent Labs Lab 01/16/15 0550  WBC 17.1*  HGB 11.0*  HCT 33.5*  MCV 74.5*  PLT 356   Cardiac Enzymes: No results for input(s): CKTOTAL, CKMB, CKMBINDEX, TROPONINI in the last 168 hours. BNP: Invalid input(s): POCBNP CBG:  Recent Labs Lab 01/16/15 1117  GLUCAP 168*    Radiological Exams: Ct Angio Chest Pe W/cm &/or Wo Cm  01/16/2015  CLINICAL DATA:  Elevated blood pressure and tachycardia. Knee surgery 2 days ago. Evaluate for pulmonary embolism. History of diabetes and anemia. EXAM: CT ANGIOGRAPHY CHEST WITH CONTRAST TECHNIQUE: Multidetector CT  imaging of the chest was performed using the standard protocol during bolus administration of intravenous contrast. Multiplanar CT image reconstructions and MIPs were obtained to evaluate the vascular anatomy. CONTRAST:  34m OMNIPAQUE IOHEXOL 350 MG/ML SOLN COMPARISON:  Radiographs 11/11/2013. FINDINGS: Mediastinum: The pulmonary arteries are well opacified with contrast. There is no evidence of acute pulmonary embolism. The aorta and great vessels demonstrate no significant findings. The heart size is normal. There are no enlarged mediastinal, hilar or axillary lymph nodes. The thyroid gland, trachea and esophagus demonstrate no significant findings. Lungs/Pleura: There is no pleural effusion.Mild emphysematous changes are noted. There is linear scarring in both lung bases. No confluent airspace opacity or suspicious pulmonary nodule. Upper abdomen: The liver demonstrates diffusely decreased density consistent with steatosis. The visualized liver is enlarged without focal abnormality. 9 mm calcification in the subhepatic space on image 109 is likely postinflammatory. The adrenal glands appear normal. Musculoskeletal/Chest wall: No chest wall lesion or acute osseous findings.Old rib fractures noted on the left. There are mild degenerative changes throughout the thoracic spine. Review of the MIP images confirms the above findings. IMPRESSION: 1. No evidence of acute pulmonary embolism or other acute chest process. 2. Mild emphysema with linear scarring in both lung bases. 3. Hepatic steatosis. 4. Old left-sided rib fractures. Electronically Signed   By: WRichardean SaleM.D.   On: 01/16/2015 09:53    EKG: Sinus tachycardia   Thank you for allowing me to participate in the care of your patient. We will continue to follow.   Note: This dictation was prepared with Dragon dictation along with smaller phrase technology. Any transcriptional errors that result from this process are unintentional.  Time spent:  50    Aerabella Galasso, MD

## 2015-01-16 NOTE — Progress Notes (Signed)
EKG staff here to do the EKG

## 2015-01-17 LAB — CBC
HCT: 31.1 % — ABNORMAL LOW (ref 35.0–47.0)
HEMOGLOBIN: 10.2 g/dL — AB (ref 12.0–16.0)
MCH: 24.6 pg — AB (ref 26.0–34.0)
MCHC: 32.9 g/dL (ref 32.0–36.0)
MCV: 74.7 fL — ABNORMAL LOW (ref 80.0–100.0)
PLATELETS: 384 10*3/uL (ref 150–440)
RBC: 4.17 MIL/uL (ref 3.80–5.20)
RDW: 14 % (ref 11.5–14.5)
WBC: 16.2 10*3/uL — ABNORMAL HIGH (ref 3.6–11.0)

## 2015-01-17 LAB — BASIC METABOLIC PANEL
Anion gap: 11 (ref 5–15)
BUN: 9 mg/dL (ref 6–20)
CHLORIDE: 94 mmol/L — AB (ref 101–111)
CO2: 26 mmol/L (ref 22–32)
CREATININE: 0.48 mg/dL (ref 0.44–1.00)
Calcium: 8.1 mg/dL — ABNORMAL LOW (ref 8.9–10.3)
GFR calc Af Amer: >60 mL/min (ref >60)
GFR calc non Af Amer: >60 mL/min (ref >60)
GLUCOSE: 169 mg/dL — AB (ref 65–99)
Potassium: 3.6 mmol/L (ref 3.5–5.1)
SODIUM: 131 mmol/L — AB (ref 135–145)

## 2015-01-17 LAB — GLUCOSE, CAPILLARY
GLUCOSE-CAPILLARY: 157 mg/dL — AB (ref 65–99)
GLUCOSE-CAPILLARY: 158 mg/dL — AB (ref 65–99)

## 2015-01-17 MED ORDER — ENOXAPARIN SODIUM 40 MG/0.4ML ~~LOC~~ SOLN: 40.0000 mg | SUBCUTANEOUS | Status: DC

## 2015-01-17 MED ORDER — OXYCODONE HCL 5 MG PO TABS: 5.0000 mg | ORAL_TABLET | ORAL | Status: DC | PRN

## 2015-01-17 MED ORDER — ALPRAZOLAM 0.25 MG PO TABS
0.2500 mg | ORAL_TABLET | Freq: Two times a day (BID) | ORAL | Status: DC | PRN
Start: 2015-01-17 — End: 2015-01-17

## 2015-01-17 MED ORDER — METOPROLOL SUCCINATE ER 25 MG PO TB24: 25.0000 mg | ORAL_TABLET | Freq: Every day | ORAL | Status: DC

## 2015-01-17 MED ORDER — DIAZEPAM 5 MG PO TABS: 5.0000 mg | ORAL_TABLET | Freq: Four times a day (QID) | ORAL | Status: DC | PRN

## 2015-01-17 NOTE — Progress Notes (Signed)
Physical Therapy Treatment Patient Details Name: Sheila [name] MRN: [id number] DOB: 1960/10/28 Today's Date: 01/17/2015    History of Present Illness Pt is admitted for R TKR on 01/14/15.    PT Comments    Pt appearing anxious about her HR during session and self-limiting in terms of ambulation distance (15 feet x2 with RW SBA).  HR 115-135 bpm during session's activities.  Pt's daughter verbalizes excellent knowledge of cueing required and assist required for pt to safely discharge home.  Pt and pt's daughter requesting to discharge home (d/t this recommend 24/7 assist for functional mobility for safety and use of RW).  RW that was delivered to pt's room unable to be adjusted to proper height (too tall for pt) so CM notified of need for youth sized RW.  Will continue to progress pt with ambulation distance and independence with functional mobility per pt tolerance.   Follow Up Recommendations  Home health PT;Supervision/Assistance - 24 hour (pt and pt's daughter report having this available)     Equipment Recommendations  Rolling walker with 5" wheels (youth sized (CM notified))    Recommendations for Other Services       Precautions / Restrictions Precautions Precautions: Fall Restrictions Weight Bearing Restrictions: Yes RLE Weight Bearing: Weight bearing as tolerated    Mobility  Bed Mobility Overal bed mobility: Needs Assistance Bed Mobility: Supine to Sit;Sit to Supine     Supine to sit: Supervision Sit to supine: Supervision   General bed mobility comments: pt used L LE to assist with R LE in/out of bed  Transfers Overall transfer level: Needs assistance Equipment used: Rolling walker (2 wheeled) Transfers: Sit to/from Stand Sit to Stand: Supervision         General transfer comment: pt given vc's for proper hand placement but preferred to use B UE's on RW to stand (will reach back when sitting with cueing though)  Ambulation/Gait Ambulation/Gait  assistance: Supervision Ambulation Distance (Feet):  (15 feet x2) Assistive device: Rolling walker (2 wheeled)   Gait velocity: decreased   General Gait Details: decreased stance time R LE; decreased WB'ing R LE; decreased step length L LE compared to R LE   Stairs  Deferred (pt has ramp to enter home).          Wheelchair Mobility    Modified Rankin (Stroke Patients Only)       Balance Overall balance assessment: Needs assistance Sitting-balance support: No upper extremity supported;Feet supported Sitting balance-Leahy Scale: Normal     Standing balance support: Bilateral upper extremity supported (on RW) Standing balance-Leahy Scale: Good                      Cognition Arousal/Alertness: Awake/alert Behavior During Therapy: WFL for tasks assessed/performed Overall Cognitive Status: Within Functional Limits for tasks assessed                      Exercises Total Joint Exercises Goniometric ROM: R knee extension 5 degrees short of neutral semi-supine; R knee flexion 90 degrees semi-supine (pt unable to tolerate in sitting) Performed semi-supine B LE therapeutic exercise x 10 reps:  Ankle pumps (AROM B LE's); quad sets x3 second holds (AROM B LE's); SAQ's (AAROM R; AROM L); heelslides (AAROM R; AROM L), hip abd/adduction (AAROM R; AROM L), and SLR (AAROM R; AROM L).  Pt required vc's and tactile cues for correct technique with exercises.    General Comments General comments (skin integrity, edema, etc.): R  knee dressing in place  Nursing cleared pt for participation in physical therapy.  Pt agreeable to PT session.  Pt's daughter present during session. Pt and pt's daughter educated on pacing and activity modifications (ex; limit distance ambulating at home; take rest breaks) to avoid significant HR increases: both appearing with good understanding.      Pertinent Vitals/Pain Pain Assessment: 0-10 Pain Score: 6  Pain Location: R knee Pain  Descriptors / Indicators: Aching;Sore;Tender Pain Intervention(s): Limited activity within patient's tolerance;Monitored during session;Premedicated before session;Repositioned;Ice applied    Home Living                      Prior Function            PT Goals (current goals can now be found in the care plan section) Acute Rehab PT Goals Patient Stated Goal: to go home PT Goal Formulation: With patient/family Time For Goal Achievement: 01/28/15 Potential to Achieve Goals: Good Additional Goals Additional Goal #1: Pt will be able to perform bed mobility/transfers with rw and cga in order to improve functional mobiity. Progress towards PT goals: Progressing toward goals    Frequency  BID    PT Plan Current plan remains appropriate    Co-evaluation             End of Session Equipment Utilized During Treatment: Gait belt Activity Tolerance: Patient limited by pain (Ambulation limited d/t pt's concerns for increased HR with activity) Patient left: in bed;with bed alarm set;with family/visitor present;with call bell/phone within reach;with SCD's reapplied (towel roll under R heel in place; polar care in place)     Time: 9728-2060 PT Time Calculation (min) (ACUTE ONLY): 45 min  Charges:  $Gait Training: 8-22 mins $Therapeutic Exercise: 8-22 mins $Therapeutic Activity: 8-22 mins                    G CodesLeitha Bleak 02-10-15, 11:30 AM Leitha Bleak, [place]

## 2015-01-17 NOTE — Progress Notes (Signed)
[facility] [place] NAME: Sheila [name]    MR#:  [id number]  DATE OF BIRTH:  [birth year]  SUBJECTIVE:   Patient is reporting anxiety she has not seen her son cinched up in the hospital. She denies pain or palpitations.   REVIEW OF SYSTEMS:    Review of Systems  Constitutional: Negative for fever, chills and malaise/fatigue.  HENT: Negative for sore throat.   Eyes: Negative for blurred vision.  Respiratory: Negative for cough, hemoptysis, shortness of breath and wheezing.   Cardiovascular: Negative for chest pain, palpitations and leg swelling.  Gastrointestinal: Negative for nausea, vomiting, abdominal pain, diarrhea and blood in stool.  Genitourinary: Negative for dysuria.  Musculoskeletal: Negative for back pain.  Neurological: Negative for dizziness, tremors and headaches.  Endo/Heme/Allergies: Does not bruise/bleed easily.  Psychiatric/Behavioral: The patient is nervous/anxious.     Tolerating Diet: Yes      DRUG ALLERGIES:   Allergies  Allergen Reactions  . Ampicillin Anaphylaxis  . Erythromycin Anaphylaxis  . Omeprazole Anaphylaxis  . Penicillins Anaphylaxis    VITALS:  Blood pressure 132/80, pulse 123, temperature 98.3 F (36.8 C), temperature source Oral, resp. rate 18, height 5' 1"  (1.549 m), weight 87.454 kg (192 lb 12.8 oz), SpO2 97 %.  PHYSICAL EXAMINATION:   Physical Exam  Constitutional: [name] oriented to person, place, and time and well-developed, well-nourished, and in no distress. No distress.  HENT:  Head: Normocephalic.  Eyes: No scleral icterus.  Neck: Normal range of motion. Neck supple. No JVD present. No tracheal deviation present.  Cardiovascular: Normal rate, regular rhythm and normal heart sounds.  Exam reveals no gallop and no friction rub.   No murmur heard. Sinus tachycardia   Pulmonary/Chest: Effort normal and breath sounds normal. No respiratory distress. She has no wheezes.  She has no rales. She exhibits no tenderness.  Abdominal: Soft. Bowel sounds are normal. She exhibits no distension and no mass. There is no tenderness. There is no rebound and no guarding.  Musculoskeletal: Normal range of motion. She exhibits no edema.  Neurological: [name] alert and oriented to person, place, and time.  Skin: Skin is warm. No rash noted. No erythema.  Psychiatric: Affect and judgment normal.      LABORATORY PANEL:   CBC  Recent Labs Lab 01/17/15 0708  WBC 16.2*  HGB 10.2*  HCT 31.1*  PLT 384   ------------------------------------------------------------------------------------------------------------------  Chemistries   Recent Labs Lab 01/17/15 0708  NA 131*  K 3.6  CL 94*  CO2 26  GLUCOSE 169*  BUN 9  CREATININE 0.48  CALCIUM 8.1*   ------------------------------------------------------------------------------------------------------------------  Cardiac Enzymes No results for input(s): TROPONINI in the last 168 hours. ------------------------------------------------------------------------------------------------------------------  RADIOLOGY:  Ct Angio Chest Pe W/cm &/or Wo Cm  01/16/2015  CLINICAL DATA:  Elevated blood pressure and tachycardia. Knee surgery 2 days ago. Evaluate for pulmonary embolism. History of diabetes and anemia. EXAM: CT ANGIOGRAPHY CHEST WITH CONTRAST TECHNIQUE: Multidetector CT imaging of the chest was performed using the standard protocol during bolus administration of intravenous contrast. Multiplanar CT image reconstructions and MIPs were obtained to evaluate the vascular anatomy. CONTRAST:  54m OMNIPAQUE IOHEXOL 350 MG/ML SOLN COMPARISON:  Radiographs 11/11/2013. FINDINGS: Mediastinum: The pulmonary arteries are well opacified with contrast. There is no evidence of acute pulmonary embolism. The aorta and great vessels demonstrate no significant findings. The heart size is normal. There are no enlarged mediastinal, hilar  or axillary lymph nodes. The thyroid gland, trachea  and esophagus demonstrate no significant findings. Lungs/Pleura: There is no pleural effusion.Mild emphysematous changes are noted. There is linear scarring in both lung bases. No confluent airspace opacity or suspicious pulmonary nodule. Upper abdomen: The liver demonstrates diffusely decreased density consistent with steatosis. The visualized liver is enlarged without focal abnormality. 9 mm calcification in the subhepatic space on image 109 is likely postinflammatory. The adrenal glands appear normal. Musculoskeletal/Chest wall: No chest wall lesion or acute osseous findings.Old rib fractures noted on the left. There are mild degenerative changes throughout the thoracic spine. Review of the MIP images confirms the above findings. IMPRESSION: 1. No evidence of acute pulmonary embolism or other acute chest process. 2. Mild emphysema with linear scarring in both lung bases. 3. Hepatic steatosis. 4. Old left-sided rib fractures. Electronically Signed   By: Richardean Sale M.D.   On: 01/16/2015 09:53     ASSESSMENT AND PLAN:   55 y/o female POD #2 for right knee replacement with persistent sinus tachycardia.  1. Sinus tachycardia: I am suspecting this is in part due to anxiety and probably a little bit of dehydration. Her sodium has improved IV fluids. I have prescribed Xanax when necessary. I have also increase metoprolol. She was cleared by cardiology prior to her surgery. [name] medically stable for discharge home and then follow up with her cardiologist early next week..  2. Essential hypertension: Continue increased dose of metoprolol for better blood pressure control. Continue losartan  3. Diabetes without complication: Continue outpatient regimen and she will follow-up with her primary care physician for an A1c. Continue siding scale insulin and add ADA diet.  4. Constipation: Continue bowel regimen  5. Hyponatremia: This is suspected to be due  to hypovolemia. Sodium level improved with IV fluids.      Management plans discussed with the patient and she is in agreement.  CODE STATUS: full  TOTAL TIME TAKING CARE OF THIS PATIENT: 30 minutes.     POSSIBLE D/C today, DEPENDING ON CLINICAL CONDITION. Patient would be okay to be discharged home today with close follow-up for her tachycardia.  Akansha Wyche M.D on 01/17/2015 at 9:33 AM  Between 7am to 6pm - Pager - [phone] After 6pm go to [email or web address] - password [email or web address] [facility]  Office  [phone]  CC: Primary care physician; Ellamae Sia, MD  Note: This dictation was prepared with Dragon dictation along with smaller phrase technology. Any transcriptional errors that result from this process are unintentional.

## 2015-01-17 NOTE — Care Management (Signed)
Youth walker requested in place of full size walker per PT request; it has been delivered. I have notified Corene Cornea with [facility] of patient discharge. No further RNCM needs. Case closed.

## 2015-01-17 NOTE — Progress Notes (Signed)
Inpatient Diabetes Program Recommendations  AACE/ADA: New Consensus Statement on Inpatient Glycemic Control (2015)  Target Ranges:  Prepandial:   less than 140 mg/dL      Peak postprandial:   less than 180 mg/dL (1-2 hours)      Critically ill patients:  140 - 180 mg/dL   Review of Glycemic Control  Results for SOLARIS, KRAM (MRN [id number]) as of 01/17/2015 08:01  Ref. Range 01/15/2015 21:36 01/16/2015 07:47 01/16/2015 11:17 01/16/2015 16:23 01/16/2015 22:01  Glucose-Capillary Latest Ref Range: 65-99 mg/dL 179 (H) 189 (H) 168 (H) 192 (H) 145 (H)    Diabetes history: Type 2 Outpatient Diabetes medications: Metformin 523m bid Current orders for Inpatient glycemic control: Metformin 5034mbid, Novolog insulin 0-15 units tid with meals  Inpatient Diabetes Program Recommendations: Consider ordering an A1C to determine blood sugar control over the past 3 months (there is no recent A1C in care everywhere)    Please change diet to carb modified/heart healthy diet.   JuGentry FitzRN, BA, MHA, CDE Diabetes Coordinator Inpatient Diabetes Program  [phone] Pager) [phone]/13/2017 8:05 AM

## 2015-01-17 NOTE — Progress Notes (Signed)
PT Cancellation Note  Patient Details Name: Sheila [name] MRN: [id number] DOB: 02-Feb-1960   Cancelled Treatment:    Reason Eval/Treat Not Completed: Other (comment).  Pt reports discharging home soon and declining any further PT today.  Pt educated on need to check new youth sized RW fit (that was delivered to pt's room) but pt refusing to get OOB to check and pt's daughter adamant that the RW height was correct (although it appeared too short to this PT).  After discussion/education, pt's daughter only allowed PT to adjust RW up one notch (closer to height of walker pt used from hospital during hospital stay).  Pt and pt's daughter educated on how to appropriately check walker fit (and fit walker to pt) once pt did decide to get OOB/stand and use RW.   Leitha Bleak 01/17/2015, 2:17 PM Leitha Bleak, [place]

## 2015-01-17 NOTE — Progress Notes (Signed)
Inpatient Diabetes Program Recommendations  AACE/ADA: New Consensus Statement on Inpatient Glycemic Control (2015)  Target Ranges:  Prepandial:   less than 140 mg/dL      Peak postprandial:   less than 180 mg/dL (1-2 hours)      Critically ill patients:  140 - 180 mg/dL   Spoke with patient and her daughter re: elevated blood sugars.  Patient saw MD recently and had an A1C drawn at that visit but are unaware of what it was.  Patient and daughter state they will schedule with MD for follow up post discharge.   Gentry Fitz, RN, BA, MHA, CDE Diabetes Coordinator Inpatient Diabetes Program  [phone] (Team Pager) [phone] ([place]) 01/17/2015 10:19 AM

## 2015-01-17 NOTE — Progress Notes (Signed)
Pt discharge instructions given , verbalizes understanding  Daughter states she knowes how to give lovenox injection  Discharged via wheelchair with belongings care of family

## 2015-01-17 NOTE — Discharge Summary (Signed)
Physician Discharge Summary  Patient ID: Sheila [name] MRN: [id number] DOB/AGE: 09/02/1960 55 y.o.  Admit date: 01/14/2015 Discharge date: 01/17/2015  Admission Diagnoses:  [place] RIGHT KNEE   Discharge Diagnoses: Patient Active Problem List   Diagnosis Date Noted  . Primary osteoarthritis of right knee 01/14/2015    Past Medical History  Diagnosis Date  . Hypertension   . Arthritis   . Anemia   . Diabetes mellitus without complication ([place])     on metformin  . 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[place]) 35/3299  . Fatty liver   . Ulcerative colitis ([place])     on mesalamine  . COPD (chronic obstructive pulmonary disease) ([place])   . GERD (gastroesophageal reflux disease)   . Hyperlipidemia   . Glaucoma   . Renal disorder   . Asthma      Transfusion: None   Consultants (if any): Treatment Team:  Bettey Costa, MD  Discharged Condition: Improved  Hospital Course: Sheila [name] is an 55 y.o. female who was admitted 01/14/2015 with a diagnosis of  Right knee osteoarthritis and went to the operating room on 01/14/2015 and underwent the above named procedures.    Surgeries: Procedure(s): TOTAL KNEE ARTHROPLASTY on 01/14/2015 Patient tolerated the surgery well. Taken to PACU where she was stabilized and then transferred to the orthopedic floor.  Started on Lovenox  30 q 12th hrs. Foot pumps applied bilaterally at 80 mm. Heels elevated on bed with rolled towels. No evidence of DVT. Negative [place]. Physical therapy started on day #1 for gait training and transfer. OT started day #1 for ADL and assisted devices.  Patient's foley was d/c on day #1. On postop day 1 patient was found to be tachycardic.  Medicine was consultation.  EKG showed sinus tach.  CT ruled out pulmonary embolism.  Echo was performed and showed no abnormalities.  Sodium was low, fluid restrictions were placed and patient was given IV fluids.  Sodium did improve.  Throughout patient's day although she was  tachycardic she was asymptomatic.  Patient was given IV fluids, metoprolol was increased and Valium was started for anxiety.  Her tachycardia was thought to be partly due to dehydration as well as anxiety.  On post op day #3 patient was stable and ready for discharge to  home.  Implants: Medacta GMK sphere 2+ femur, 2 tibia with 11 mm insert, short stem and 1 patella all components cemented  She was given perioperative antibiotics:  Anti-infectives    Start     Dose/Rate Route Frequency Ordered Stop   01/14/15 1500  clindamycin (CLEOCIN) IVPB 900 mg     900 mg 100 mL/hr over 30 Minutes Intravenous Every 6 hours 01/14/15 1214 01/14/15 2149   01/14/15 1245  levofloxacin (LEVAQUIN) tablet 500 mg  Status:  Discontinued     500 mg Oral Daily 01/14/15 1214 01/16/15 1144   01/14/15 0756  clindamycin (CLEOCIN) 600 MG/50ML IVPB    Comments:  Rexanne Mano: cabinet override      01/14/15 0756 01/14/15 0928   01/14/15 0715  clindamycin (CLEOCIN) IVPB 600 mg  Status:  Discontinued     600 mg 100 mL/hr over 30 Minutes Intravenous  Once 01/14/15 0714 01/14/15 1212    .  She was given sequential compression devices, early ambulation, and Lovenox for DVT prophylaxis.  She benefited maximally from the hospital stay and there were no complications.    Recent vital signs:  Filed Vitals:   01/17/15 0801 01/17/15 0920  BP: 132/80  Pulse: 123 135  Temp: 98.3 F (36.8 C)   Resp: 18     Recent laboratory studies:  Lab Results  Component Value Date   HGB 10.2* 01/17/2015   HGB 11.0* 01/16/2015   HGB 11.6* 01/15/2015   Lab Results  Component Value Date   WBC 16.2* 01/17/2015   PLT 384 01/17/2015   Lab Results  Component Value Date   INR 0.96 01/01/2015   Lab Results  Component Value Date   NA 131* 01/17/2015   K 3.6 01/17/2015   CL 94* 01/17/2015   CO2 26 01/17/2015   BUN 9 01/17/2015   CREATININE 0.48 01/17/2015   GLUCOSE 169* 01/17/2015    Discharge Medications:      Medication List    STOP taking these medications        oxycodone 5 MG capsule  Commonly known as:  OXY-IR  Replaced by:  oxyCODONE 5 MG immediate release tablet      TAKE these medications        albuterol 108 (90 Base) MCG/ACT inhaler  Commonly known as:  PROVENTIL HFA;VENTOLIN HFA  Inhale 2 puffs into the lungs every 4 (four) hours as needed for wheezing or shortness of breath.     brinzolamide 1 % ophthalmic suspension  Commonly known as:  AZOPT  Place 1 drop into both eyes 3 (three) times daily.     chlorthalidone 25 MG tablet  Commonly known as:  HYGROTON  Take 25 mg by mouth daily.     diazepam 5 MG tablet  Commonly known as:  VALIUM  Take 1 tablet (5 mg total) by mouth every 6 (six) hours as needed for anxiety.     enoxaparin 40 MG/0.4ML injection  Commonly known as:  LOVENOX  Inject 0.4 mLs (40 mg total) into the skin daily.     fluticasone 50 MCG/ACT nasal spray  Commonly known as:  FLONASE  Place 2 sprays into both nostrils daily.     gabapentin 300 MG capsule  Commonly known as:  NEURONTIN  Take 1 capsule by mouth 2 (two) times daily.     ibuprofen 800 MG tablet  Commonly known as:  ADVIL,MOTRIN  Take 800 mg by mouth every 8 (eight) hours as needed for mild pain.     KLOR-CON M20 20 MEQ tablet  Generic drug:  potassium chloride SA  Take 2 tablets by mouth 2 (two) times daily.     latanoprost 0.005 % ophthalmic solution  Commonly known as:  XALATAN  Place 1 drop into both eyes at bedtime.     levofloxacin 500 MG tablet  Commonly known as:  LEVAQUIN  Take 1 tablet by mouth daily. Reported on 01/14/2015     losartan 25 MG tablet  Commonly known as:  COZAAR  Take 25 mg by mouth daily.     mesalamine 0.375 g 24 hr capsule  Commonly known as:  APRISO  Take 375 mg by mouth daily.     metFORMIN 500 MG 24 hr tablet  Commonly known as:  GLUCOPHAGE-XR  Take 500 mg by mouth 2 (two) times daily.     metoprolol succinate 25 MG 24 hr tablet  Commonly  known as:  TOPROL-XL  Take 1 tablet (25 mg total) by mouth daily.     MUCINEX 600 MG 12 hr tablet  Generic drug:  guaiFENesin  Take 1 tablet by mouth 2 (two) times daily as needed for cough.     oxyCODONE 5 MG immediate release tablet  Commonly known as:  Oxy IR/ROXICODONE  Take 1-2 tablets (5-10 mg total) by mouth every 3 (three) hours as needed for moderate pain.     pantoprazole 40 MG tablet  Commonly known as:  PROTONIX  Take 40 mg by mouth every morning.     pravastatin 10 MG tablet  Commonly known as:  PRAVACHOL  Take 10 mg by mouth at bedtime.        Diagnostic Studies: Dg Knee 1-2 Views Right  01/14/2015  CLINICAL DATA:  Postop right knee replacement. EXAM: RIGHT KNEE - 1-2 VIEW COMPARISON:  11/22/2014 FINDINGS: Interval right total knee replacement. No evidence for immediate hardware complications. Gas in the overlying soft tissues is compatible with the immediate postoperative state. IMPRESSION: Status post tricompartmental knee replacement. Electronically Signed   By: Misty Stanley M.D.   On: 01/14/2015 11:45   Ct Angio Chest Pe W/cm &/or Wo Cm  01/16/2015  CLINICAL DATA:  Elevated blood pressure and tachycardia. Knee surgery 2 days ago. Evaluate for pulmonary embolism. History of diabetes and anemia. EXAM: CT ANGIOGRAPHY CHEST WITH CONTRAST TECHNIQUE: Multidetector CT imaging of the chest was performed using the standard protocol during bolus administration of intravenous contrast. Multiplanar CT image reconstructions and MIPs were obtained to evaluate the vascular anatomy. CONTRAST:  45m OMNIPAQUE IOHEXOL 350 MG/ML SOLN COMPARISON:  Radiographs 11/11/2013. FINDINGS: Mediastinum: The pulmonary arteries are well opacified with contrast. There is no evidence of acute pulmonary embolism. The aorta and great vessels demonstrate no significant findings. The heart size is normal. There are no enlarged mediastinal, hilar or axillary lymph nodes. The thyroid gland, trachea and  esophagus demonstrate no significant findings. Lungs/Pleura: There is no pleural effusion.Mild emphysematous changes are noted. There is linear scarring in both lung bases. No confluent airspace opacity or suspicious pulmonary nodule. Upper abdomen: The liver demonstrates diffusely decreased density consistent with steatosis. The visualized liver is enlarged without focal abnormality. 9 mm calcification in the subhepatic space on image 109 is likely postinflammatory. The adrenal glands appear normal. Musculoskeletal/Chest wall: No chest wall lesion or acute osseous findings.Old rib fractures noted on the left. There are mild degenerative changes throughout the thoracic spine. Review of the MIP images confirms the above findings. IMPRESSION: 1. No evidence of acute pulmonary embolism or other acute chest process. 2. Mild emphysema with linear scarring in both lung bases. 3. Hepatic steatosis. 4. Old left-sided rib fractures. Electronically Signed   By: WRichardean SaleM.D.   On: 01/16/2015 09:53   Ct Knee Right Wo Contrast  12/20/2014  CLINICAL DATA:  Preop for right total knee arthroplasty. MY Knee CT protocol. EXAM: CT OF THE right KNEE WITHOUT CONTRAST TECHNIQUE: Multidetector CT imaging of the right knee was performed according to the standard protocol. Multiplanar CT image reconstructions were also generated. COMPARISON:  Radiographs 11/22/2014 FINDINGS: Right hip: No significant degenerative changes. No fracture or AVN. Incidental note is made of an enlarged fibroid uterus. Right knee: Significant tricompartmental degenerative changes most notable in the medial compartment where there is significant joint space narrowing, osteophytic spurring, subchondral cystic change and bony eburnation. Near complete loss of the joint space. Mild to moderate degenerative changes involving the lateral compartment and patellofemoral joint. Small joint effusion. Right ankle: Mild degenerative changes. No fracture or  osteochondral lesion. IMPRESSION: Significant tricompartmental degenerative changes most notable in the medial compartment of the knee where there is near complete loss of the joint space. Electronically Signed   By: PMarijo SanesM.D.   On: 12/20/2014  11:14    Disposition: 01-Home or Self Care        Follow-up Information    Follow up with MENZ,MICHAEL, MD In 2 weeks.   Specialty:  Orthopedic Surgery   Why:  For staple removal and skin check   Contact information:   [place] [address] [phone]        Signed: Feliberto Gottron 01/17/2015, 12:39 PM

## 2015-01-17 NOTE — Progress Notes (Signed)
   Subjective: 3 Days Post-Op Procedure(s) (LRB): TOTAL KNEE ARTHROPLASTY (Right) Patient reports pain as mild.   Patient is well, and has had no acute complaints or problems  No CP,SOB, Abd pain, UA symptoms We will continue therapy today.  Plan is to go Home after hospital stay. Patient with sinus tachycardia, medicine onboard. CT chest negative.  Objective: Vital signs in last 24 hours: Temp:  [98.3 F (36.8 C)-99 F (37.2 C)] 98.3 F (36.8 C) (01/13 0801) Pulse Rate:  [111-133] 123 (01/13 0801) Resp:  [18] 18 (01/13 0801) BP: (108-151)/(68-93) 132/80 mmHg (01/13 0801) SpO2:  [94 %-97 %] 97 % (01/13 0801)  Intake/Output from previous day: 01/12 0701 - 01/13 0700 In: 1906.7 [P.O.:480; I.V.:1426.7] Out: 851 [Urine:850; Emesis/NG output:1] Intake/Output this shift:     Recent Labs  01/14/15 1230 01/15/15 0346 01/16/15 0550 01/17/15 0708  HGB 11.9* 11.6* 11.0* 10.2*    Recent Labs  01/16/15 0550 01/17/15 0708  WBC 17.1* 16.2*  RBC 4.49 4.17  HCT 33.5* 31.1*  PLT 356 384    Recent Labs  01/16/15 0550  01/16/15 2055 01/17/15 0708  NA 130*  < > 131* 131*  K 4.0  --   --  3.6  CL 94*  --   --  94*  CO2 27  --   --  26  BUN 8  --   --  9  CREATININE 0.46  --   --  0.48  GLUCOSE 195*  --   --  169*  CALCIUM 8.5*  --   --  8.1*  < > = values in this interval not displayed. No results for input(s): LABPT, INR in the last 72 hours.  EXAM General - Patient is Alert, Appropriate and Oriented Extremity - Sensation intact distally Intact pulses distally Dorsiflexion/Plantar flexion intact  Dressing - dressing C/D/I,  Motor Function - intact, moving foot and toes well on exam.   Past Medical History  Diagnosis Date  . Hypertension   . Arthritis   . Anemia   . Diabetes mellitus without complication ([place])     on metformin  . 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[place]) 40/1027  . Fatty liver   . Ulcerative colitis ([place])     on mesalamine  . COPD (chronic obstructive  pulmonary disease) ([place])   . GERD (gastroesophageal reflux disease)   . Hyperlipidemia   . Glaucoma   . Renal disorder   . Asthma     Assessment/Plan:   3 Days Post-Op Procedure(s) (LRB): TOTAL KNEE ARTHROPLASTY (Right) Active Problems:   Primary osteoarthritis of right knee  Estimated body mass index is 36.45 kg/(m^2) as calculated from the following:   Height as of this encounter: 5' 1"  (1.549 m).   Weight as of this encounter: 87.454 kg (192 lb 12.8 oz). Advance diet Up with therapy  appreciate medicine input Discharge home with home health pending Medical clearance   DVT Prophylaxis - Lovenox, Foot Pumps and TED hose Weight-Bearing as tolerated to right leg D/C O2 and Pulse OX and try on Room Air  T. Rachelle Hora, PA-C [place] 01/17/2015, 8:03 AM

## 2015-05-08 ENCOUNTER — Other Ambulatory Visit: Payer: Self-pay | Admitting: Gastroenterology

## 2015-05-08 DIAGNOSIS — R7401 Elevation of levels of liver transaminase levels: Secondary | ICD-10-CM

## 2015-05-08 DIAGNOSIS — R740 Nonspecific elevation of levels of transaminase and lactic acid dehydrogenase [LDH]: Principal | ICD-10-CM

## 2015-05-22 ENCOUNTER — Ambulatory Visit
Admission: RE | Admit: 2015-05-22 | Discharge: 2015-05-22 | Disposition: A | Discharge status: Home / Self Care | Payer: Medicaid Other | Source: Ambulatory Visit | Attending: Gastroenterology | Admitting: Gastroenterology

## 2015-05-22 DIAGNOSIS — K760 Fatty (change of) liver, not elsewhere classified: Secondary | ICD-10-CM | POA: Insufficient documentation

## 2015-05-22 DIAGNOSIS — R740 Nonspecific elevation of levels of transaminase and lactic acid dehydrogenase [LDH]: Secondary | ICD-10-CM | POA: Diagnosis present

## 2015-05-22 DIAGNOSIS — R7401 Elevation of levels of liver transaminase levels: Secondary | ICD-10-CM

## 2015-08-13 ENCOUNTER — Other Ambulatory Visit: Payer: Self-pay | Admitting: Internal Medicine

## 2015-08-13 DIAGNOSIS — Z1231 Encounter for screening mammogram for malignant neoplasm of breast: Secondary | ICD-10-CM

## 2015-08-18 ENCOUNTER — Encounter: Payer: Self-pay | Admitting: *Deleted

## 2015-08-19 ENCOUNTER — Ambulatory Visit: Payer: Medicaid Other | Admitting: Anesthesiology

## 2015-08-19 ENCOUNTER — Encounter
Admission: RE | Disposition: A | Discharge status: Home / Self Care | Payer: Self-pay | Source: Ambulatory Visit | Attending: Gastroenterology

## 2015-08-19 ENCOUNTER — Encounter: Payer: Self-pay | Admitting: Anesthesiology

## 2015-08-19 ENCOUNTER — Ambulatory Visit
Admission: RE | Admit: 2015-08-19 | Discharge: 2015-08-19 | Disposition: A | Discharge status: Home / Self Care | Payer: Medicaid Other | Source: Ambulatory Visit | Attending: Gastroenterology | Admitting: Gastroenterology

## 2015-08-19 DIAGNOSIS — M1990 Unspecified osteoarthritis, unspecified site: Secondary | ICD-10-CM | POA: Diagnosis not present

## 2015-08-19 DIAGNOSIS — M5136 Other intervertebral disc degeneration, lumbar region: Secondary | ICD-10-CM | POA: Diagnosis not present

## 2015-08-19 DIAGNOSIS — K5190 Ulcerative colitis, unspecified, without complications: Secondary | ICD-10-CM | POA: Insufficient documentation

## 2015-08-19 DIAGNOSIS — E785 Hyperlipidemia, unspecified: Secondary | ICD-10-CM | POA: Insufficient documentation

## 2015-08-19 DIAGNOSIS — H409 Unspecified glaucoma: Secondary | ICD-10-CM | POA: Diagnosis not present

## 2015-08-19 DIAGNOSIS — Z87891 Personal history of nicotine dependence: Secondary | ICD-10-CM | POA: Diagnosis not present

## 2015-08-19 DIAGNOSIS — Z880 Allergy status to penicillin: Secondary | ICD-10-CM | POA: Insufficient documentation

## 2015-08-19 DIAGNOSIS — Z96653 Presence of artificial knee joint, bilateral: Secondary | ICD-10-CM | POA: Insufficient documentation

## 2015-08-19 DIAGNOSIS — Z8719 Personal history of other diseases of the digestive system: Secondary | ICD-10-CM | POA: Diagnosis present

## 2015-08-19 DIAGNOSIS — J449 Chronic obstructive pulmonary disease, unspecified: Secondary | ICD-10-CM | POA: Insufficient documentation

## 2015-08-19 DIAGNOSIS — Z79899 Other long term (current) drug therapy: Secondary | ICD-10-CM | POA: Insufficient documentation

## 2015-08-19 DIAGNOSIS — G709 Myoneural disorder, unspecified: Secondary | ICD-10-CM | POA: Diagnosis not present

## 2015-08-19 DIAGNOSIS — Z7984 Long term (current) use of oral hypoglycemic drugs: Secondary | ICD-10-CM | POA: Diagnosis not present

## 2015-08-19 DIAGNOSIS — Z862 Personal history of diseases of the blood and blood-forming organs and certain disorders involving the immune mechanism: Secondary | ICD-10-CM | POA: Insufficient documentation

## 2015-08-19 DIAGNOSIS — K219 Gastro-esophageal reflux disease without esophagitis: Secondary | ICD-10-CM | POA: Insufficient documentation

## 2015-08-19 DIAGNOSIS — E119 Type 2 diabetes mellitus without complications: Secondary | ICD-10-CM | POA: Insufficient documentation

## 2015-08-19 DIAGNOSIS — K760 Fatty (change of) liver, not elsewhere classified: Secondary | ICD-10-CM | POA: Diagnosis not present

## 2015-08-19 DIAGNOSIS — I10 Essential (primary) hypertension: Secondary | ICD-10-CM | POA: Insufficient documentation

## 2015-08-19 HISTORY — PX: COLONOSCOPY WITH PROPOFOL: SHX5780

## 2015-08-19 HISTORY — DX: Other intervertebral disc degeneration, lumbar region: M51.36

## 2015-08-19 HISTORY — DX: Cardiac arrhythmia, unspecified: I49.9

## 2015-08-19 HISTORY — DX: Low back pain: M54.5

## 2015-08-19 HISTORY — DX: Myoneural disorder, unspecified: G70.9

## 2015-08-19 HISTORY — DX: Low back pain, unspecified: M54.50

## 2015-08-19 HISTORY — DX: Unspecified osteoarthritis, unspecified site: M19.90

## 2015-08-19 HISTORY — DX: Irritable bowel syndrome, unspecified: K58.9

## 2015-08-19 HISTORY — DX: Other intervertebral disc degeneration, lumbar region without mention of lumbar back pain or lower extremity pain: M51.369

## 2015-08-19 HISTORY — DX: Benign neoplasm of connective and other soft tissue, unspecified: D21.9

## 2015-08-19 LAB — CBC
HEMATOCRIT: 45.2 % (ref 35.0–47.0)
Hemoglobin: 15 g/dL (ref 12.0–16.0)
MCH: 28 pg (ref 26.0–34.0)
MCHC: 33.2 g/dL (ref 32.0–36.0)
MCV: 84.4 fL (ref 80.0–100.0)
Platelets: 249 10*3/uL (ref 150–440)
RBC: 5.36 MIL/uL — ABNORMAL HIGH (ref 3.80–5.20)
RDW: 13.7 % (ref 11.5–14.5)
WBC: 7.8 10*3/uL (ref 3.6–11.0)

## 2015-08-19 LAB — PROTIME-INR
INR: 0.93
PROTHROMBIN TIME: 12.5 s (ref 11.4–15.2)

## 2015-08-19 SURGERY — COLONOSCOPY WITH PROPOFOL
Anesthesia: General

## 2015-08-19 MED ORDER — SODIUM CHLORIDE 0.9 % IV SOLN: INTRAVENOUS | Status: DC

## 2015-08-19 MED ORDER — PROPOFOL 10 MG/ML IV BOLUS
INTRAVENOUS | Status: DC | PRN
  Administered 2015-08-19: 30 mg via INTRAVENOUS
  Administered 2015-08-19: 40 mg via INTRAVENOUS

## 2015-08-19 MED ORDER — SODIUM CHLORIDE 0.9 % IV SOLN
INTRAVENOUS | Status: DC
Start: 2015-08-19 — End: 2015-08-19
  Administered 2015-08-19: 14:00:00 via INTRAVENOUS
  Administered 2015-08-19: 1000 mL via INTRAVENOUS

## 2015-08-19 MED ORDER — CLINDAMYCIN PHOSPHATE 600 MG/50ML IV SOLN
600.0000 mg | Freq: Once | INTRAVENOUS | Status: AC
  Administered 2015-08-19: 600 mg via INTRAVENOUS
  Filled 2015-08-19: qty 50

## 2015-08-19 MED ORDER — PROPOFOL 500 MG/50ML IV EMUL
INTRAVENOUS | Status: DC | PRN
  Administered 2015-08-19: 100 ug/kg/min via INTRAVENOUS

## 2015-08-19 NOTE — H&P (Signed)
Outpatient short stay form Pre-procedure 08/19/2015 2:27 PM Lollie Sails MD  Primary Physician: [facility]  Reason for visit:  Colonoscopy  History of present illness:  Patient is a 55 year old female presenting today for follow-up on her history of ulcerative colitis. Her symptoms have been relatively stable. He is currently taking Lialda at a low dose. Her last colonoscopy was 2014. She tolerated her prep well. She takes no aspirin or blood thinning agents. Chart states the patient has been using Lovenox however this was from several months ago when she had a knee surgery and not recently.    Current Facility-Administered Medications:  .  0.9 %  sodium chloride infusion, , Intravenous, Continuous, Lollie Sails, MD .  0.9 %  sodium chloride infusion, , Intravenous, Continuous, Lollie Sails, MD, Last Rate: 20 mL/hr at 08/19/15 1333, 1,000 mL at 08/19/15 1333 .  0.9 %  sodium chloride infusion, , Intravenous, Continuous, Lollie Sails, MD  Prescriptions Prior to Admission  Medication Sig Dispense Refill Last Dose  . colchicine 0.6 MG tablet Take 0.6 mg by mouth daily.     Marland Kitchen losartan (COZAAR) 25 MG tablet Take 25 mg by mouth daily.    08/19/2015 at [facility]  . metFORMIN (GLUCOPHAGE-XR) 500 MG 24 hr tablet Take 500 mg by mouth 2 (two) times daily.    01/12/2015  . metoprolol succinate (TOPROL-XL) 25 MG 24 hr tablet Take 1 tablet (25 mg total) by mouth daily. 30 tablet 0 08/19/2015 at [place]  . pantoprazole (PROTONIX) 40 MG tablet Take 40 mg by mouth every morning.   08/19/2015 at [facility]  . albuterol (PROVENTIL HFA;VENTOLIN HFA) 108 (90 BASE) MCG/ACT inhaler Inhale 2 puffs into the lungs every 4 (four) hours as needed for wheezing or shortness of breath.   01/14/2015 at 0600  . brinzolamide (AZOPT) 1 % ophthalmic suspension Place 1 drop into both eyes 3 (three) times daily.   01/13/2015 at Unknown time  . chlorthalidone (HYGROTON) 25 MG tablet Take 25 mg by mouth daily.   3 01/13/2015  at Unknown time  . diazepam (VALIUM) 5 MG tablet Take 1 tablet (5 mg total) by mouth every 6 (six) hours as needed for anxiety. 30 tablet 0   . enoxaparin (LOVENOX) 40 MG/0.4ML injection Inject 0.4 mLs (40 mg total) into the skin daily. 14 Syringe 0   . fluticasone (FLONASE) 50 MCG/ACT nasal spray Place 2 sprays into both nostrils daily.  11 01/13/2015 at Unknown time  . gabapentin (NEURONTIN) 300 MG capsule Take 1 capsule by mouth 2 (two) times daily.  0 01/13/2015 at Unknown time  . ibuprofen (ADVIL,MOTRIN) 800 MG tablet Take 800 mg by mouth every 8 (eight) hours as needed for mild pain.    01/05/2015  . KLOR-CON M20 20 MEQ tablet Take 2 tablets by mouth 2 (two) times daily.  0 01/13/2015 at Unknown time  . latanoprost (XALATAN) 0.005 % ophthalmic solution Place 1 drop into both eyes at bedtime.   01/13/2015 at Unknown time  . levofloxacin (LEVAQUIN) 500 MG tablet Take 1 tablet by mouth daily. Reported on 01/14/2015  0 Completed Course at Unknown time  . mesalamine (APRISO) 0.375 G 24 hr capsule Take 375 mg by mouth daily.    Past Week at Unknown time  . MUCINEX 600 MG 12 hr tablet Take 1 tablet by mouth 2 (two) times daily as needed for cough.   0 01/13/2015 at Unknown time  . oxyCODONE (OXY IR/ROXICODONE) 5 MG immediate release  tablet Take 1-2 tablets (5-10 mg total) by mouth every 3 (three) hours as needed for moderate pain. 60 tablet 0   . pravastatin (PRAVACHOL) 10 MG tablet Take 10 mg by mouth at bedtime.   01/13/2015 at Unknown time     Allergies  Allergen Reactions  . Ampicillin Anaphylaxis  . Erythromycin Anaphylaxis  . Omeprazole Anaphylaxis  . Penicillins Anaphylaxis  . Quinapril Cough  . Triamterene-Hctz Other (See Comments)     Past Medical History:  Diagnosis Date  . Anemia   . Arthritis   . Asthma   . COPD (chronic obstructive pulmonary disease) ([place])   . DDD (degenerative disc disease), lumbar   . Diabetes mellitus without complication ([place])    on metformin  . DJD (degenerative  joint disease)   . Dysrhythmia   . Fatty liver   . Fibroids   . GERD (gastroesophageal reflux disease)   . Glaucoma   . Hyperlipidemia   . Hypertension   . IBS (irritable bowel syndrome)   . Lumbago   . Neuromuscular disorder ([place])   . 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[place]) 10/2723  . Renal disorder   . Ulcerative colitis ([place])    on mesalamine    Review of systems:      Physical Exam    Heart and lungs: Regular rate and rhythm without rub or gallop lungs are bilaterally clear    HEENT: Normocephalic atraumatic eyes are anicteric    Other:     Pertinant exam for procedure: Soft nontender nondistended bowel sounds positive normoactive.    Planned proceedures: Colonoscopy and indicated procedures. I have discussed the risks benefits and complications of procedures to include not limited to bleeding, infection, perforation and the risk of sedation and the patient wishes to proceed.    Lollie Sails, MD Gastroenterology 08/19/2015  2:27 PM

## 2015-08-19 NOTE — Anesthesia Preprocedure Evaluation (Signed)
Anesthesia Evaluation  Patient identified by MRN, date of birth, ID band Patient awake    Reviewed: Allergy & Precautions, H&P , NPO status , Patient's Chart, lab work & pertinent test results  History of Anesthesia Complications Negative for: history of anesthetic complications  Airway Mallampati: III  TM Distance: >3 FB Neck ROM: full    Dental  (+) Poor Dentition, Chipped   Pulmonary neg shortness of breath, asthma , COPD, former smoker,    Pulmonary exam normal breath sounds clear to auscultation       Cardiovascular Exercise Tolerance: Good hypertension, (-) angina(-) Past MI and (-) DOE negative cardio ROS Normal cardiovascular exam+ dysrhythmias  Rhythm:regular Rate:Normal     Neuro/Psych PSYCHIATRIC DISORDERS  Neuromuscular disease    GI/Hepatic Neg liver ROS, PUD, GERD  Controlled,  Endo/Other  diabetes, Type 2  Renal/GU Renal disease  negative genitourinary   Musculoskeletal  (+) Arthritis ,   Abdominal   Peds  Hematology negative hematology ROS (+)   Anesthesia Other Findings Past Medical History: No date: Anemia No date: Arthritis No date: Asthma No date: COPD (chronic obstructive pulmonary disease) (* No date: DDD (degenerative disc disease), lumbar No date: Diabetes mellitus without complication ([facility])     Comment: on metformin No date: DJD (degenerative joint disease) No date: Dysrhythmia No date: Fatty liver No date: Fibroids No date: GERD (gastroesophageal reflux disease) No date: Glaucoma No date: Hyperlipidemia No date: Hypertension No date: IBS (irritable bowel syndrome) No date: Lumbago No date: Neuromuscular disorder ([place]) 05/2014: 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[place]) No date: Renal disorder No date: Ulcerative colitis ([place])     Comment: on mesalamine  Past Surgical History: 04/19/2006: JOINT REPLACEMENT Left 01/14/2015: JOINT REPLACEMENT Right 2008: TOTAL KNEE ARTHROPLASTY  Left 01/14/2015: TOTAL KNEE ARTHROPLASTY Right     Comment: Procedure: TOTAL KNEE ARTHROPLASTY;  Surgeon:               Hessie Knows, MD;  Location: [facility] ORS;                Service: Orthopedics;  Laterality: Right;  BMI    Body Mass Index:  33.98 kg/m      Reproductive/Obstetrics negative OB ROS                             Anesthesia Physical Anesthesia Plan  ASA: III  Anesthesia Plan: General   Post-op Pain Management:    Induction:   Airway Management Planned:   Additional Equipment:   Intra-op Plan:   Post-operative Plan:   Informed Consent: I have reviewed the patients History and Physical, chart, labs and discussed the procedure including the risks, benefits and alternatives for the proposed anesthesia with the patient or authorized representative who has indicated his/her understanding and acceptance.   Dental Advisory Given  Plan Discussed with: Anesthesiologist, CRNA and Surgeon  Anesthesia Plan Comments:         Anesthesia Quick Evaluation

## 2015-08-19 NOTE — Transfer of Care (Signed)
Immediate Anesthesia Transfer of Care Note  Patient: Sheila [name]  Procedure(s) Performed: Procedure(s): COLONOSCOPY WITH PROPOFOL (N/A)  Patient Location: PACU  Anesthesia Type:General  Level of Consciousness: awake, alert  and oriented  Airway & Oxygen Therapy: Patient Spontanous Breathing and Patient connected to nasal cannula oxygen  Post-op Assessment: Report given to RN and Post -op Vital signs reviewed and stable  Post vital signs: Reviewed and stable  Last Vitals:  Vitals:   08/19/15 1223  BP: 114/73  Pulse: 98  Resp: 18  Temp: 36.6 C    Last Pain:  Vitals:   08/19/15 1223  TempSrc: Tympanic         Complications: No apparent anesthesia complications

## 2015-08-19 NOTE — Op Note (Signed)
[facility] Gastroenterology Patient Name: Sheila [name] Procedure Date: 08/19/2015 2:29 PM MRN: [id number] Account #: [id number] Date of Birth: [birth year] Admit Type: Outpatient Age: 55 Room: [facility] ENDO ROOM 3 Gender: Female Note Status: Finalized Procedure:            Colonoscopy Indications:          Personal history of ulcerative colitis Providers:            Lollie Sails, MD Referring MD:         Remus Blake MD, MD (Referring MD) Medicines:            Monitored Anesthesia Care Complications:        No immediate complications. Procedure:            Pre-Anesthesia Assessment:                       - ASA Grade Assessment: III - A patient with severe                        systemic disease.                       After obtaining informed consent, the colonoscope was                        passed under direct vision. Throughout the procedure,                        the patient's blood pressure, pulse, and oxygen                        saturations were monitored continuously. The                        Colonoscope was introduced through the anus with the                        intention of advancing to the cecum. The scope was                        advanced to the sigmoid colon before the procedure was                        aborted. Medications were given. The colonoscopy was                        extremely difficult due to poor bowel prep. Findings:      A large amount of semi-liquid stool was found in the rectum and in the       sigmoid colon, precluding visualization. Impression:           - Stool in the rectum and in the sigmoid colon.                       - No specimens collected. Recommendation:       - Discharge patient to home.                       - repeat prep and reschedule. Procedure Code(s):    --- Professional ---  54883, 53, Colonoscopy, flexible; diagnostic, including                        collection of  specimen(s) by brushing or washing, when                        performed (separate procedure) Diagnosis Code(s):    --- Professional ---                       Z87.19, Personal history of other diseases of the                        digestive system CPT copyright 2016 American Medical Association. All rights reserved. The codes documented in this report are preliminary and upon coder review may  be revised to meet current compliance requirements. Lollie Sails, MD 08/19/2015 2:52:15 PM This report has been signed electronically. Number of Addenda: 0 Note Initiated On: 08/19/2015 2:29 PM Total Procedure Duration: 0 hours 1 minute 51 seconds       [facility]

## 2015-08-20 ENCOUNTER — Encounter: Payer: Self-pay | Admitting: Gastroenterology

## 2015-08-20 NOTE — Anesthesia Postprocedure Evaluation (Signed)
Anesthesia Post Note  Patient: Sheila [name]  Procedure(s) Performed: Procedure(s) (LRB): COLONOSCOPY WITH PROPOFOL (N/A)  Patient location during evaluation: PACU Anesthesia Type: General Level of consciousness: awake and alert and oriented Pain management: pain level controlled Vital Signs Assessment: post-procedure vital signs reviewed and stable Respiratory status: spontaneous breathing, nonlabored ventilation and respiratory function stable Cardiovascular status: blood pressure returned to baseline and stable Postop Assessment: no signs of nausea or vomiting Anesthetic complications: no    Last Vitals:  Vitals:   08/19/15 1516 08/19/15 1526  BP: 127/73 125/75  Pulse: 77 75  Resp: 13 15  Temp:      Last Pain:  Vitals:   08/20/15 0744  TempSrc:   PainSc: 0-No pain                 [name]

## 2015-09-10 ENCOUNTER — Ambulatory Visit: Payer: Medicaid Other

## 2015-09-22 ENCOUNTER — Ambulatory Visit: Payer: Medicaid Other

## 2015-09-22 ENCOUNTER — Ambulatory Visit
Admission: RE | Admit: 2015-09-22 | Discharge: 2015-09-22 | Disposition: A | Discharge status: Home / Self Care | Payer: Medicaid Other | Source: Ambulatory Visit | Attending: Internal Medicine | Admitting: Internal Medicine

## 2015-09-22 DIAGNOSIS — Z1231 Encounter for screening mammogram for malignant neoplasm of breast: Secondary | ICD-10-CM | POA: Insufficient documentation

## 2015-12-08 ENCOUNTER — Ambulatory Visit: Payer: Medicaid Other | Admitting: Anesthesiology

## 2015-12-08 ENCOUNTER — Encounter: Payer: Self-pay | Admitting: *Deleted

## 2015-12-08 ENCOUNTER — Encounter
Admission: RE | Disposition: A | Discharge status: Home / Self Care | Payer: Self-pay | Source: Ambulatory Visit | Attending: Gastroenterology

## 2015-12-08 ENCOUNTER — Ambulatory Visit
Admission: RE | Admit: 2015-12-08 | Discharge: 2015-12-08 | Disposition: A | Discharge status: Home / Self Care | Payer: Medicaid Other | Source: Ambulatory Visit | Attending: Gastroenterology | Admitting: Gastroenterology

## 2015-12-08 DIAGNOSIS — I10 Essential (primary) hypertension: Secondary | ICD-10-CM | POA: Diagnosis not present

## 2015-12-08 DIAGNOSIS — D649 Anemia, unspecified: Secondary | ICD-10-CM | POA: Insufficient documentation

## 2015-12-08 DIAGNOSIS — E119 Type 2 diabetes mellitus without complications: Secondary | ICD-10-CM | POA: Diagnosis not present

## 2015-12-08 DIAGNOSIS — Z79899 Other long term (current) drug therapy: Secondary | ICD-10-CM | POA: Insufficient documentation

## 2015-12-08 DIAGNOSIS — K5190 Ulcerative colitis, unspecified, without complications: Secondary | ICD-10-CM | POA: Diagnosis not present

## 2015-12-08 DIAGNOSIS — Z7984 Long term (current) use of oral hypoglycemic drugs: Secondary | ICD-10-CM | POA: Insufficient documentation

## 2015-12-08 DIAGNOSIS — H409 Unspecified glaucoma: Secondary | ICD-10-CM | POA: Insufficient documentation

## 2015-12-08 DIAGNOSIS — Z87891 Personal history of nicotine dependence: Secondary | ICD-10-CM | POA: Diagnosis not present

## 2015-12-08 DIAGNOSIS — K219 Gastro-esophageal reflux disease without esophagitis: Secondary | ICD-10-CM | POA: Diagnosis not present

## 2015-12-08 DIAGNOSIS — E785 Hyperlipidemia, unspecified: Secondary | ICD-10-CM | POA: Diagnosis not present

## 2015-12-08 DIAGNOSIS — J449 Chronic obstructive pulmonary disease, unspecified: Secondary | ICD-10-CM | POA: Insufficient documentation

## 2015-12-08 DIAGNOSIS — M1990 Unspecified osteoarthritis, unspecified site: Secondary | ICD-10-CM | POA: Diagnosis not present

## 2015-12-08 DIAGNOSIS — M5136 Other intervertebral disc degeneration, lumbar region: Secondary | ICD-10-CM | POA: Diagnosis not present

## 2015-12-08 DIAGNOSIS — Z880 Allergy status to penicillin: Secondary | ICD-10-CM | POA: Diagnosis not present

## 2015-12-08 HISTORY — PX: COLONOSCOPY WITH PROPOFOL: SHX5780

## 2015-12-08 LAB — GLUCOSE, CAPILLARY: Glucose-Capillary: 162 mg/dL — ABNORMAL HIGH (ref 65–99)

## 2015-12-08 SURGERY — COLONOSCOPY WITH PROPOFOL
Anesthesia: General

## 2015-12-08 MED ORDER — SODIUM CHLORIDE 0.9 % IV SOLN: INTRAVENOUS | Status: DC

## 2015-12-08 MED ORDER — PROPOFOL 500 MG/50ML IV EMUL
INTRAVENOUS | Status: DC | PRN
  Administered 2015-12-08: 140 ug/kg/min via INTRAVENOUS

## 2015-12-08 MED ORDER — LIDOCAINE HCL (CARDIAC) 20 MG/ML IV SOLN
INTRAVENOUS | Status: DC | PRN
  Administered 2015-12-08: 100 mg via INTRAVENOUS

## 2015-12-08 MED ORDER — SODIUM CHLORIDE 0.9 % IV SOLN
INTRAVENOUS | Status: DC
  Administered 2015-12-08: 13:00:00 via INTRAVENOUS

## 2015-12-08 MED ORDER — PROPOFOL 10 MG/ML IV BOLUS
INTRAVENOUS | Status: DC | PRN
  Administered 2015-12-08: 80 mg via INTRAVENOUS

## 2015-12-08 NOTE — Transfer of Care (Signed)
Immediate Anesthesia Transfer of Care Note  Patient: Sheila [name]  Procedure(s) Performed: Procedure(s): COLONOSCOPY WITH PROPOFOL (N/A)  Patient Location: PACU  Anesthesia Type:General  Level of Consciousness: awake, alert  and oriented  Airway & Oxygen Therapy: Patient Spontanous Breathing and Patient connected to nasal cannula oxygen  Post-op Assessment: Report given to RN and Post -op Vital signs reviewed and stable  Post vital signs: Reviewed and stable  Last Vitals:  Vitals:   12/08/15 1241  BP: 126/74  Pulse: 77  Resp: 16  Temp: 36.8 C    Last Pain:  Vitals:   12/08/15 1241  TempSrc: Tympanic         Complications: No apparent anesthesia complications

## 2015-12-08 NOTE — Op Note (Signed)
[facility] Gastroenterology Patient Name: Sheila [name] Procedure Date: 12/08/2015 1:49 PM MRN: [id number] Account #: [id number] Date of Birth: 08/13/1960 Admit Type: Outpatient Age: 55 Room: [facility] ENDO ROOM 1 Gender: Female Note Status: Finalized Procedure:            Colonoscopy Indications:          Personal history of ulcerative colitis Providers:            Lollie Sails, MD Referring MD:         Remus Blake MD, MD (Referring MD) Medicines:            Monitored Anesthesia Care Complications:        No immediate complications. Procedure:            Pre-Anesthesia Assessment:                       - ASA Grade Assessment: III - A patient with severe                        systemic disease.                       After obtaining informed consent, the colonoscope was                        passed under direct vision. Throughout the procedure,                        the patient's blood pressure, pulse, and oxygen                        saturations were monitored continuously. The                        Colonoscope was introduced through the anus with the                        intention of advancing to the cecum. The scope was                        advanced to the sigmoid colon before the procedure was                        aborted. Medications were given. The colonoscopy was                        extremely difficult due to poor bowel prep with stool                        present. Findings:      A large amount of semi-liquid semi-solid stool was found in the rectum       and in the sigmoid colon, precluding visualization.      procedure aborted Impression:           - Stool in the rectum and in the sigmoid colon.                       - No specimens collected. Recommendation:       - Discharge patient to home. Procedure Code(s):    --- Professional ---  68599, 53, Colonoscopy, flexible; diagnostic, including        collection of specimen(s) by brushing or washing, when                        performed (separate procedure) Diagnosis Code(s):    --- Professional ---                       Z87.19, Personal history of other diseases of the                        digestive system CPT copyright 2016 American Medical Association. All rights reserved. The codes documented in this report are preliminary and upon coder review may  be revised to meet current compliance requirements. Lollie Sails, MD 12/08/2015 2:02:50 PM This report has been signed electronically. Number of Addenda: 0 Note Initiated On: 12/08/2015 1:49 PM      [facility]

## 2015-12-08 NOTE — Anesthesia Preprocedure Evaluation (Signed)
Anesthesia Evaluation  Patient identified by MRN, date of birth, ID band Patient awake    Reviewed: Allergy & Precautions, NPO status , Patient's Chart, lab work & pertinent test results  History of Anesthesia Complications Negative for: history of anesthetic complications  Airway Mallampati: II       Dental   Pulmonary asthma , COPD,  COPD inhaler, former smoker,           Cardiovascular hypertension, Pt. on medications and Pt. on home beta blockers      Neuro/Psych    GI/Hepatic Neg liver ROS, PUD, GERD  Medicated,  Endo/Other  diabetes, Type 2, Oral Hypoglycemic Agents  Renal/GU      Musculoskeletal   Abdominal   Peds  Hematology  (+) anemia ,   Anesthesia Other Findings   Reproductive/Obstetrics                            Anesthesia Physical Anesthesia Plan  ASA: III  Anesthesia Plan: General   Post-op Pain Management:    Induction: Intravenous  Airway Management Planned: Nasal Cannula  Additional Equipment:   Intra-op Plan:   Post-operative Plan:   Informed Consent: I have reviewed the patients History and Physical, chart, labs and discussed the procedure including the risks, benefits and alternatives for the proposed anesthesia with the patient or authorized representative who has indicated his/her understanding and acceptance.     Plan Discussed with:   Anesthesia Plan Comments:         Anesthesia Quick Evaluation

## 2015-12-08 NOTE — H&P (Signed)
Outpatient short stay form Pre-procedure 12/08/2015 1:44 PM Lollie Sails MD  Primary Physician: [facility]  Reason for visit:  Colonoscopy  History of present illness:  Patient is a 55 year old female presenting today as above. She has a personal history of ulcerative colitis. Her last colonoscopy was in 2014, this was the time of diagnosis. It is of note that her chart reflects her taking at present 1.5 g daily however she states she is not taking this or at least only taking 1 pill daily. She tolerated her prep well. She takes no blood thinning agents with the exception of a minidose aspirin.    Current Facility-Administered Medications:  .  0.9 %  sodium chloride infusion, , Intravenous, Continuous, Lollie Sails, MD, Last Rate: 20 mL/hr at 12/08/15 1257 .  0.9 %  sodium chloride infusion, , Intravenous, Continuous, Lollie Sails, MD  Prescriptions Prior to Admission  Medication Sig Dispense Refill Last Dose  . chlorthalidone (HYGROTON) 25 MG tablet Take 25 mg by mouth daily.   3 12/08/2015 at 0525  . colchicine 0.6 MG tablet Take 0.6 mg by mouth daily.   Past Week at Unknown time  . KLOR-CON M20 20 MEQ tablet Take 2 tablets by mouth 2 (two) times daily.  0 Past Week at Unknown time  . losartan (COZAAR) 25 MG tablet Take 25 mg by mouth daily.    12/08/2015 at Unknown time  . mesalamine (APRISO) 0.375 G 24 hr capsule Take 375 mg by mouth daily.    Past Week at Unknown time  . metoprolol succinate (TOPROL-XL) 25 MG 24 hr tablet Take 1 tablet (25 mg total) by mouth daily. 30 tablet 0 12/08/2015 at 0525  . pantoprazole (PROTONIX) 40 MG tablet Take 40 mg by mouth every morning.   12/08/2015 at 0525  . pravastatin (PRAVACHOL) 10 MG tablet Take 10 mg by mouth at bedtime.   Past Week at Unknown time  . albuterol (PROVENTIL HFA;VENTOLIN HFA) 108 (90 BASE) MCG/ACT inhaler Inhale 2 puffs into the lungs every 4 (four) hours as needed for wheezing or shortness of breath.   01/14/2015  at 0600  . brinzolamide (AZOPT) 1 % ophthalmic suspension Place 1 drop into both eyes 3 (three) times daily.   01/13/2015 at Unknown time  . diazepam (VALIUM) 5 MG tablet Take 1 tablet (5 mg total) by mouth every 6 (six) hours as needed for anxiety. 30 tablet 0   . enoxaparin (LOVENOX) 40 MG/0.4ML injection Inject 0.4 mLs (40 mg total) into the skin daily. (Patient not taking: Reported on 12/08/2015) 14 Syringe 0 Not Taking at Unknown time  . fluticasone (FLONASE) 50 MCG/ACT nasal spray Place 2 sprays into both nostrils daily.  11 01/13/2015 at Unknown time  . gabapentin (NEURONTIN) 300 MG capsule Take 1 capsule by mouth 2 (two) times daily.  0 01/13/2015 at Unknown time  . ibuprofen (ADVIL,MOTRIN) 800 MG tablet Take 800 mg by mouth every 8 (eight) hours as needed for mild pain.    01/05/2015  . latanoprost (XALATAN) 0.005 % ophthalmic solution Place 1 drop into both eyes at bedtime.   01/13/2015 at Unknown time  . levofloxacin (LEVAQUIN) 500 MG tablet Take 1 tablet by mouth daily. Reported on 01/14/2015  0 Completed Course at Unknown time  . metFORMIN (GLUCOPHAGE-XR) 500 MG 24 hr tablet Take 500 mg by mouth 2 (two) times daily.    12/06/2015  . MUCINEX 600 MG 12 hr tablet Take 1 tablet by mouth 2 (two)  times daily as needed for cough.   0 01/13/2015 at Unknown time  . oxyCODONE (OXY IR/ROXICODONE) 5 MG immediate release tablet Take 1-2 tablets (5-10 mg total) by mouth every 3 (three) hours as needed for moderate pain. 60 tablet 0      Allergies  Allergen Reactions  . Ampicillin Anaphylaxis  . Erythromycin Anaphylaxis  . Omeprazole Anaphylaxis  . Penicillins Anaphylaxis  . Quinapril Cough  . Triamterene-Hctz Other (See Comments)     Past Medical History:  Diagnosis Date  . Anemia   . Arthritis   . Asthma   . COPD (chronic obstructive pulmonary disease) ([place])   . DDD (degenerative disc disease), lumbar   . Diabetes mellitus without complication ([place])    on metformin  . DJD (degenerative joint  disease)   . Dysrhythmia   . Fatty liver   . Fibroids   . GERD (gastroesophageal reflux disease)   . Glaucoma   . Hyperlipidemia   . Hypertension   . IBS (irritable bowel syndrome)   . Lumbago   . Neuromuscular disorder ([place])   . 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[place]) 84/5364  . Renal disorder   . Ulcerative colitis ([place])    on mesalamine    Review of systems:      Physical Exam    Heart and lungs: Regular rate and rhythm without rub or gallop, lungs are bilaterally clear.    HEENT: Normocephalic atraumatic eyes are anicteric    Other:     Pertinant exam for procedure: Soft nontender nondistended bowel sounds positive normoactive.    Planned proceedures: Colonoscopy and indicated procedures. I have discussed the risks benefits and complications of procedures to include not limited to bleeding, infection, perforation and the risk of sedation and the patient wishes to proceed.    Lollie Sails, MD Gastroenterology 12/08/2015  1:44 PM

## 2015-12-08 NOTE — Anesthesia Postprocedure Evaluation (Signed)
Anesthesia Post Note  Patient: Sheila [name]  Procedure(s) Performed: Procedure(s) (LRB): COLONOSCOPY WITH PROPOFOL (N/A)  Patient location during evaluation: Endoscopy Anesthesia Type: General Level of consciousness: awake and alert Pain management: pain level controlled Vital Signs Assessment: post-procedure vital signs reviewed and stable Respiratory status: spontaneous breathing and respiratory function stable Cardiovascular status: stable Anesthetic complications: no    Last Vitals:  Vitals:   12/08/15 1241 12/08/15 1402  BP: 126/74 124/88  Pulse: 77 83  Resp: 16 20  Temp: 36.8 C (!) 35.7 C    Last Pain:  Vitals:   12/08/15 1402  TempSrc: Tympanic                 KEPHART,WILLIAM K

## 2015-12-09 ENCOUNTER — Ambulatory Visit
Admission: RE | Admit: 2015-12-09 | Discharge: 2015-12-09 | Disposition: A | Discharge status: Home / Self Care | Payer: Medicaid Other | Source: Ambulatory Visit | Attending: Gastroenterology | Admitting: Gastroenterology

## 2015-12-09 ENCOUNTER — Encounter
Admission: RE | Disposition: A | Discharge status: Home / Self Care | Payer: Self-pay | Source: Ambulatory Visit | Attending: Gastroenterology

## 2015-12-09 ENCOUNTER — Encounter: Payer: Self-pay | Admitting: Gastroenterology

## 2015-12-09 ENCOUNTER — Ambulatory Visit: Payer: Medicaid Other | Admitting: Anesthesiology

## 2015-12-09 DIAGNOSIS — D128 Benign neoplasm of rectum: Secondary | ICD-10-CM | POA: Diagnosis not present

## 2015-12-09 DIAGNOSIS — E119 Type 2 diabetes mellitus without complications: Secondary | ICD-10-CM | POA: Insufficient documentation

## 2015-12-09 DIAGNOSIS — J449 Chronic obstructive pulmonary disease, unspecified: Secondary | ICD-10-CM | POA: Insufficient documentation

## 2015-12-09 DIAGNOSIS — K5730 Diverticulosis of large intestine without perforation or abscess without bleeding: Secondary | ICD-10-CM | POA: Insufficient documentation

## 2015-12-09 DIAGNOSIS — I10 Essential (primary) hypertension: Secondary | ICD-10-CM | POA: Diagnosis not present

## 2015-12-09 DIAGNOSIS — Z7984 Long term (current) use of oral hypoglycemic drugs: Secondary | ICD-10-CM | POA: Diagnosis not present

## 2015-12-09 DIAGNOSIS — H409 Unspecified glaucoma: Secondary | ICD-10-CM | POA: Diagnosis not present

## 2015-12-09 DIAGNOSIS — D120 Benign neoplasm of cecum: Secondary | ICD-10-CM | POA: Insufficient documentation

## 2015-12-09 DIAGNOSIS — Z87891 Personal history of nicotine dependence: Secondary | ICD-10-CM | POA: Diagnosis not present

## 2015-12-09 DIAGNOSIS — Z1211 Encounter for screening for malignant neoplasm of colon: Secondary | ICD-10-CM | POA: Diagnosis present

## 2015-12-09 DIAGNOSIS — K5190 Ulcerative colitis, unspecified, without complications: Secondary | ICD-10-CM | POA: Insufficient documentation

## 2015-12-09 DIAGNOSIS — E785 Hyperlipidemia, unspecified: Secondary | ICD-10-CM | POA: Diagnosis not present

## 2015-12-09 DIAGNOSIS — K219 Gastro-esophageal reflux disease without esophagitis: Secondary | ICD-10-CM | POA: Diagnosis not present

## 2015-12-09 DIAGNOSIS — Z79899 Other long term (current) drug therapy: Secondary | ICD-10-CM | POA: Insufficient documentation

## 2015-12-09 HISTORY — PX: COLONOSCOPY WITH PROPOFOL: SHX5780

## 2015-12-09 LAB — CBC
HCT: 46.2 % (ref 35.0–47.0)
HEMOGLOBIN: 15.1 g/dL (ref 12.0–16.0)
MCH: 27.7 pg (ref 26.0–34.0)
MCHC: 32.6 g/dL (ref 32.0–36.0)
MCV: 84.8 fL (ref 80.0–100.0)
PLATELETS: 262 10*3/uL (ref 150–440)
RBC: 5.45 MIL/uL — AB (ref 3.80–5.20)
RDW: 13.6 % (ref 11.5–14.5)
WBC: 7.4 10*3/uL (ref 3.6–11.0)

## 2015-12-09 LAB — PROTIME-INR
INR: 1.02
PROTHROMBIN TIME: 13.4 s (ref 11.4–15.2)

## 2015-12-09 LAB — GLUCOSE, CAPILLARY: Glucose-Capillary: 170 mg/dL — ABNORMAL HIGH (ref 65–99)

## 2015-12-09 SURGERY — COLONOSCOPY WITH PROPOFOL
Anesthesia: General

## 2015-12-09 MED ORDER — MIDAZOLAM HCL 2 MG/2ML IJ SOLN
INTRAMUSCULAR | Status: DC | PRN
  Administered 2015-12-09: 1 mg via INTRAVENOUS

## 2015-12-09 MED ORDER — SODIUM CHLORIDE 0.9 % IV SOLN: INTRAVENOUS | Status: DC

## 2015-12-09 MED ORDER — PROPOFOL 10 MG/ML IV BOLUS
INTRAVENOUS | Status: DC | PRN
  Administered 2015-12-09: 50 mg via INTRAVENOUS

## 2015-12-09 MED ORDER — SODIUM CHLORIDE 0.9 % IV SOLN
INTRAVENOUS | Status: DC
  Administered 2015-12-09: 1000 mL via INTRAVENOUS

## 2015-12-09 MED ORDER — EPHEDRINE SULFATE 50 MG/ML IJ SOLN
INTRAMUSCULAR | Status: DC | PRN
  Administered 2015-12-09: 5 mg via INTRAVENOUS

## 2015-12-09 MED ORDER — EPHEDRINE SULFATE 50 MG/ML IJ SOLN: INTRAMUSCULAR | Status: DC | PRN

## 2015-12-09 MED ORDER — PHENYLEPHRINE HCL 10 MG/ML IJ SOLN
INTRAMUSCULAR | Status: DC | PRN
  Administered 2015-12-09 (×7): 100 ug via INTRAVENOUS
  Administered 2015-12-09: 150 ug via INTRAVENOUS
  Administered 2015-12-09: 100 ug via INTRAVENOUS

## 2015-12-09 MED ORDER — CLINDAMYCIN PHOSPHATE 600 MG/50ML IV SOLN
600.0000 mg | Freq: Once | INTRAVENOUS | Status: AC
  Administered 2015-12-09: 600 mg via INTRAVENOUS
  Filled 2015-12-09 (×2): qty 50

## 2015-12-09 MED ORDER — PROPOFOL 500 MG/50ML IV EMUL
INTRAVENOUS | Status: DC | PRN
  Administered 2015-12-09: 150 ug/kg/min via INTRAVENOUS

## 2015-12-09 NOTE — Anesthesia Postprocedure Evaluation (Signed)
Anesthesia Post Note  Patient: Sheila [name]  Procedure(s) Performed: Procedure(s) (LRB): COLONOSCOPY WITH PROPOFOL (N/A)  Patient location during evaluation: PACU Anesthesia Type: General Level of consciousness: awake and alert and oriented Pain management: pain level controlled Vital Signs Assessment: post-procedure vital signs reviewed and stable Respiratory status: spontaneous breathing, nonlabored ventilation and respiratory function stable Cardiovascular status: blood pressure returned to baseline and stable Postop Assessment: no signs of nausea or vomiting Anesthetic complications: no    Last Vitals:  Vitals:   12/09/15 1242 12/09/15 1252  BP: 95/71 119/74  Pulse: 78 69  Resp: (!) 21 12  Temp:      Last Pain:  Vitals:   12/09/15 1232  TempSrc: Tympanic                 Elina Streng

## 2015-12-09 NOTE — Op Note (Signed)
[facility] Gastroenterology Patient Name: Sheila [name] Procedure Date: 12/09/2015 11:42 AM MRN: [id number] Account #: [id number] Date of Birth: 1960/04/14 Admit Type: Outpatient Age: 55 Room: [facility] ENDO ROOM 3 Gender: Female Note Status: Finalized Procedure:            Colonoscopy Indications:          Personal history of ulcerative colitis Providers:            Lollie Sails, MD Referring MD:         Remus Blake MD, MD (Referring MD) Medicines:            Monitored Anesthesia Care Complications:        No immediate complications. Procedure:            Pre-Anesthesia Assessment:                       - ASA Grade Assessment: III - A patient with severe                        systemic disease.                       After obtaining informed consent, the colonoscope was                        passed under direct vision. Throughout the procedure,                        the patient's blood pressure, pulse, and oxygen                        saturations were monitored continuously. The                        Colonoscope was introduced through the anus and                        advanced to the the cecum, identified by appendiceal                        orifice and ileocecal valve. The quality of the bowel                        preparation was good. Findings:      A few small-mouthed diverticula were found in the sigmoid colon,       descending colon, transverse colon and cecum.      A 2 mm polyp was found in the cecum, near the appendiceal orifice. The       polyp was sessile. The polyp was removed with a cold biopsy forceps.       Resection and retrieval were complete.      A 2 mm polyp was found in the cecum. The polyp was sessile. The polyp       was removed with a cold biopsy forceps. Resection and retrieval were       complete.      It was noted that the IC valve everted, showing a mild inflammation of       the TI, and a biopsy was obtained of this  site. Several attempts were       made to intubate the  terminal ileum without success.      A 3 mm polyp was found in the rectum. The polyp was sessile. The polyp       was removed with a cold snare. Resection and retrieval were complete.      The exam was otherwise without abnormality.      Biopsies for histology were taken with a cold forceps from the cecum,       ascending colon, transverse colon, descending colon, sigmoid colon and       rectum for evaluation of microscopic colitis. Impression:           - Diverticulosis in the sigmoid colon, in the                        descending colon, in the transverse colon and in the                        cecum.                       - One 2 mm polyp in the cecum, removed with a cold                        biopsy forceps. Resected and retrieved.                       - One 2 mm polyp in the cecum, removed with a cold                        biopsy forceps. Resected and retrieved.                       - One 3 mm polyp in the rectum, removed with a cold                        snare. Resected and retrieved. Recommendation:       - Discharge patient to home.                       - Soft diet for 2 days, then advance as tolerated to                        advance diet as tolerated.                       - Await pathology results.                       - Continue present medications.                       - Return to GI clinic in 3 weeks. Procedure Code(s):    --- Professional ---                       [phone], Colonoscopy, flexible; with removal of tumor(s),                        polyp(s), or other lesion(s) by snare technique                       45380,  59, Colonoscopy, flexible; with biopsy, single                        or multiple Diagnosis Code(s):    --- Professional ---                       D12.0, Benign neoplasm of cecum                       K62.1, Rectal polyp                       Z87.19, Personal history of other diseases of the                         digestive system                       K57.30, Diverticulosis of large intestine without                        perforation or abscess without bleeding CPT copyright 2016 American Medical Association. All rights reserved. The codes documented in this report are preliminary and upon coder review may  be revised to meet current compliance requirements. Lollie Sails, MD 12/09/2015 12:35:31 PM This report has been signed electronically. Number of Addenda: 0 Note Initiated On: 12/09/2015 11:42 AM Scope Withdrawal Time: 0 hours 26 minutes 41 seconds  Total Procedure Duration: 0 hours 39 minutes 3 seconds       [facility]

## 2015-12-09 NOTE — H&P (Signed)
Outpatient short stay form Pre-procedure 12/09/2015 11:36 AM Lollie Sails MD  [facility]  Reason for visit:  Colonoscopy  History of present illness:  Patient is a 55 year old female presenting as above. She actually came in yesterday prep was not adequate for procedure and she went for reprep over the night. She did this well. She takes no blood thinning agents with the exception of the minidose aspirin.    Current Facility-Administered Medications:  .  0.9 %  sodium chloride infusion, , Intravenous, Continuous, Lollie Sails, MD, Last Rate: 20 mL/hr at 12/09/15 0940, 1,000 mL at 12/09/15 0940 .  0.9 %  sodium chloride infusion, , Intravenous, Continuous, Lollie Sails, MD  Prescriptions Prior to Admission  Medication Sig Dispense Refill Last Dose  . albuterol (PROVENTIL HFA;VENTOLIN HFA) 108 (90 BASE) MCG/ACT inhaler Inhale 2 puffs into the lungs every 4 (four) hours as needed for wheezing or shortness of breath.   Past Week at Unknown time  . brinzolamide (AZOPT) 1 % ophthalmic suspension Place 1 drop into both eyes 3 (three) times daily.   Past Week at Unknown time  . chlorthalidone (HYGROTON) 25 MG tablet Take 25 mg by mouth daily.   3 12/09/2015 at 0515  . colchicine 0.6 MG tablet Take 0.6 mg by mouth daily.   Past Week at Unknown time  . diazepam (VALIUM) 5 MG tablet Take 1 tablet (5 mg total) by mouth every 6 (six) hours as needed for anxiety. 30 tablet 0 Past Week at Unknown time  . enoxaparin (LOVENOX) 40 MG/0.4ML injection Inject 0.4 mLs (40 mg total) into the skin daily. 14 Syringe 0 Past Week at Unknown time  . fluticasone (FLONASE) 50 MCG/ACT nasal spray Place 2 sprays into both nostrils daily.  11 Past Week at Unknown time  . gabapentin (NEURONTIN) 300 MG capsule Take 1 capsule by mouth 2 (two) times daily.  0 Past Week at Unknown time  . ibuprofen (ADVIL,MOTRIN) 800 MG tablet Take 800 mg by mouth every 8 (eight) hours as needed for mild  pain.    Past Week at Unknown time  . KLOR-CON M20 20 MEQ tablet Take 2 tablets by mouth 2 (two) times daily.  0 Past Week at Unknown time  . latanoprost (XALATAN) 0.005 % ophthalmic solution Place 1 drop into both eyes at bedtime.   Past Week at Unknown time  . levofloxacin (LEVAQUIN) 500 MG tablet Take 1 tablet by mouth daily. Reported on 01/14/2015  0 Past Week at Unknown time  . losartan (COZAAR) 25 MG tablet Take 25 mg by mouth daily.    12/09/2015 at [place]  . mesalamine (APRISO) 0.375 G 24 hr capsule Take 375 mg by mouth daily.    Past Week at Unknown time  . metFORMIN (GLUCOPHAGE-XR) 500 MG 24 hr tablet Take 500 mg by mouth 2 (two) times daily.    Past Week at Unknown time  . metoprolol succinate (TOPROL-XL) 25 MG 24 hr tablet Take 1 tablet (25 mg total) by mouth daily. 30 tablet 0 12/09/2015 at 0515  . MUCINEX 600 MG 12 hr tablet Take 1 tablet by mouth 2 (two) times daily as needed for cough.   0 Past Week at Unknown time  . oxyCODONE (OXY IR/ROXICODONE) 5 MG immediate release tablet Take 1-2 tablets (5-10 mg total) by mouth every 3 (three) hours as needed for moderate pain. 60 tablet 0 Past Week at Unknown time  . pantoprazole (PROTONIX) 40 MG tablet Take 40 mg  by mouth every morning.   12/09/2015 at [place]  . pravastatin (PRAVACHOL) 10 MG tablet Take 10 mg by mouth at bedtime.   Past Week at Unknown time     Allergies  Allergen Reactions  . Ampicillin Anaphylaxis  . Erythromycin Anaphylaxis  . Omeprazole Anaphylaxis  . Penicillins Anaphylaxis  . Quinapril Cough  . Triamterene-Hctz Other (See Comments)     Past Medical History:  Diagnosis Date  . Anemia   . Arthritis   . Asthma   . COPD (chronic obstructive pulmonary disease) ([place])   . DDD (degenerative disc disease), lumbar   . Diabetes mellitus without complication ([place])    on metformin  . DJD (degenerative joint disease)   . Dysrhythmia   . Fatty liver   . Fibroids   . GERD (gastroesophageal reflux disease)   . Glaucoma    . Hyperlipidemia   . Hypertension   . IBS (irritable bowel syndrome)   . Lumbago   . Neuromuscular disorder ([place])   . 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[place]) 73/4037  . Renal disorder   . Ulcerative colitis ([place])    on mesalamine    Review of systems:      Physical Exam    Heart and lungs: Regular rate and rhythm without rub or gallop, lungs are bilaterally clear    HEENT: Normocephalic atraumatic eyes are anicteric    Other:     Pertinant exam for procedure: Soft nontender nondistended bowel sounds positive normoactive.    Planned proceedures: Colonoscopy and indicated procedures. I have discussed the risks benefits and complications of procedures to include not limited to bleeding, infection, perforation and the risk of sedation and the patient wishes to proceed.    Lollie Sails, MD Gastroenterology 12/09/2015  11:36 AM

## 2015-12-09 NOTE — Anesthesia Preprocedure Evaluation (Signed)
Anesthesia Evaluation  Patient identified by MRN, date of birth, ID band Patient awake    Reviewed: Allergy & Precautions, NPO status , Patient's Chart, lab work & pertinent test results  History of Anesthesia Complications Negative for: history of anesthetic complications  Airway Mallampati: II  TM Distance: >3 FB Neck ROM: Full    Dental no notable dental hx.    Pulmonary asthma , neg sleep apnea, COPD,  COPD inhaler, former smoker,    breath sounds clear to auscultation- rhonchi (-) wheezing      Cardiovascular Exercise Tolerance: Good hypertension, Pt. on medications (-) CAD and (-) Past MI  Rhythm:Regular Rate:Normal - Systolic murmurs and - Diastolic murmurs Echo 3/41/93: - Procedure narrative: Transthoracic echocardiography. Image   quality was suboptimal. The study was technically difficult, as a   result of poor acoustic windows and poor sound wave transmission. - Left ventricle: Systolic function was normal. The estimated   ejection fraction was in the range of 60% to 65%.   Neuro/Psych negative psych ROS   GI/Hepatic Neg liver ROS, PUD, GERD  ,  Endo/Other  diabetes, Type 2, Oral Hypoglycemic Agents  Renal/GU negative Renal ROS     Musculoskeletal  (+) Arthritis ,   Abdominal (+) + obese,   Peds  Hematology  (+) anemia ,   Anesthesia Other Findings Past Medical History: No date: Anemia No date: Arthritis No date: Asthma No date: COPD (chronic obstructive pulmonary disease) (* No date: DDD (degenerative disc disease), lumbar No date: Diabetes mellitus without complication (HCC)     Comment: on metformin No date: DJD (degenerative joint disease) No date: Dysrhythmia No date: Fatty liver No date: Fibroids No date: GERD (gastroesophageal reflux disease) No date: Glaucoma No date: Hyperlipidemia No date: Hypertension No date: IBS (irritable bowel syndrome) No date: Lumbago No date: Neuromuscular  disorder ([place]) 05/2014: 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[place]) No date: Renal disorder No date: Ulcerative colitis ([place])     Comment: on mesalamine   Reproductive/Obstetrics                             Anesthesia Physical Anesthesia Plan  ASA: III  Anesthesia Plan: General   Post-op Pain Management:    Induction: Intravenous  Airway Management Planned: Natural Airway  Additional Equipment:   Intra-op Plan:   Post-operative Plan:   Informed Consent: I have reviewed the patients History and Physical, chart, labs and discussed the procedure including the risks, benefits and alternatives for the proposed anesthesia with the patient or authorized representative who has indicated his/her understanding and acceptance.   Dental advisory given  Plan Discussed with: CRNA and Anesthesiologist  Anesthesia Plan Comments:         Anesthesia Quick Evaluation

## 2015-12-09 NOTE — Transfer of Care (Signed)
Immediate Anesthesia Transfer of Care Note  Patient: Sheila [name]  Procedure(s) Performed: Procedure(s): COLONOSCOPY WITH PROPOFOL (N/A)  Patient Location: PACU  Anesthesia Type:General  Level of Consciousness: awake and alert   Airway & Oxygen Therapy: Patient Spontanous Breathing and Patient connected to nasal cannula oxygen  Post-op Assessment: Report given to RN and Post -op Vital signs reviewed and stable  Post vital signs: Reviewed and stable  Last Vitals:  Vitals:   12/09/15 0921 12/09/15 1232  BP: 120/70 (!) 88/63  Pulse: 79 81  Resp: 16 13  Temp: 36.8 C (!) 35.7 C    Last Pain:  Vitals:   12/09/15 1232  TempSrc: Tympanic         Complications: No apparent anesthesia complications

## 2015-12-10 ENCOUNTER — Encounter: Payer: Self-pay | Admitting: Gastroenterology

## 2015-12-10 LAB — SURGICAL PATHOLOGY

## 2016-01-31 ENCOUNTER — Emergency Department
Admission: EM | Admit: 2016-01-31 | Discharge: 2016-01-31 | Disposition: A | Discharge status: Home / Self Care | Payer: Medicaid Other | Attending: Emergency Medicine | Admitting: Emergency Medicine

## 2016-01-31 DIAGNOSIS — Z87891 Personal history of nicotine dependence: Secondary | ICD-10-CM | POA: Insufficient documentation

## 2016-01-31 DIAGNOSIS — R040 Epistaxis: Secondary | ICD-10-CM | POA: Insufficient documentation

## 2016-01-31 DIAGNOSIS — Z7984 Long term (current) use of oral hypoglycemic drugs: Secondary | ICD-10-CM | POA: Diagnosis not present

## 2016-01-31 DIAGNOSIS — E119 Type 2 diabetes mellitus without complications: Secondary | ICD-10-CM | POA: Insufficient documentation

## 2016-01-31 DIAGNOSIS — J45909 Unspecified asthma, uncomplicated: Secondary | ICD-10-CM | POA: Diagnosis not present

## 2016-01-31 DIAGNOSIS — Z79899 Other long term (current) drug therapy: Secondary | ICD-10-CM | POA: Diagnosis not present

## 2016-01-31 DIAGNOSIS — J449 Chronic obstructive pulmonary disease, unspecified: Secondary | ICD-10-CM | POA: Insufficient documentation

## 2016-01-31 DIAGNOSIS — Z791 Long term (current) use of non-steroidal anti-inflammatories (NSAID): Secondary | ICD-10-CM | POA: Diagnosis not present

## 2016-01-31 DIAGNOSIS — I10 Essential (primary) hypertension: Secondary | ICD-10-CM | POA: Diagnosis not present

## 2016-01-31 LAB — CBC
HEMATOCRIT: 42.8 % (ref 35.0–47.0)
HEMOGLOBIN: 13.8 g/dL (ref 12.0–16.0)
MCH: 27.1 pg (ref 26.0–34.0)
MCHC: 32.2 g/dL (ref 32.0–36.0)
MCV: 84.1 fL (ref 80.0–100.0)
PLATELETS: 285 10*3/uL (ref 150–440)
RBC: 5.09 MIL/uL (ref 3.80–5.20)
RDW: 13.7 % (ref 11.5–14.5)
WBC: 9.7 10*3/uL (ref 3.6–11.0)

## 2016-01-31 LAB — COMPREHENSIVE METABOLIC PANEL
ALK PHOS: 69 U/L (ref 38–126)
ALT: 53 U/L (ref 14–54)
AST: 56 U/L — AB (ref 15–41)
Albumin: 4.1 g/dL (ref 3.5–5.0)
Anion gap: 19 — ABNORMAL HIGH (ref 5–15)
BUN: 9 mg/dL (ref 6–20)
CALCIUM: 8.1 mg/dL — AB (ref 8.9–10.3)
CHLORIDE: 95 mmol/L — AB (ref 101–111)
CO2: 23 mmol/L (ref 22–32)
CREATININE: 0.62 mg/dL (ref 0.44–1.00)
Glucose, Bld: 135 mg/dL — ABNORMAL HIGH (ref 65–99)
Potassium: 2.4 mmol/L — CL (ref 3.5–5.1)
Sodium: 137 mmol/L (ref 135–145)
Total Bilirubin: 0.8 mg/dL (ref 0.3–1.2)
Total Protein: 7.5 g/dL (ref 6.5–8.1)

## 2016-01-31 LAB — TROPONIN I

## 2016-01-31 MED ORDER — BACITRACIN ZINC 500 UNIT/GM EX OINT
TOPICAL_OINTMENT | CUTANEOUS | Status: AC
  Filled 2016-01-31: qty 0.9

## 2016-01-31 MED ORDER — BACITRACIN ZINC 500 UNIT/GM EX OINT
TOPICAL_OINTMENT | Freq: Once | CUTANEOUS | Status: AC
  Administered 2016-01-31: 22:00:00 via TOPICAL

## 2016-01-31 MED ORDER — OXYMETAZOLINE HCL 0.05 % NA SOLN
NASAL | Status: AC
  Administered 2016-01-31: 1 via NASAL
  Filled 2016-01-31: qty 15

## 2016-01-31 MED ORDER — POTASSIUM CHLORIDE CRYS ER 20 MEQ PO TBCR
20.0000 meq | EXTENDED_RELEASE_TABLET | Freq: Once | ORAL | Status: AC
  Administered 2016-01-31: 20 meq via ORAL
  Filled 2016-01-31: qty 1

## 2016-01-31 MED ORDER — OXYMETAZOLINE HCL 0.05 % NA SOLN
1.0000 | Freq: Once | NASAL | Status: AC
  Administered 2016-01-31: 1 via NASAL

## 2016-01-31 NOTE — ED Notes (Signed)
Dr. Marcelene Butte notified of critical lab value - Potassium 2.4

## 2016-01-31 NOTE — ED Provider Notes (Signed)
Time Seen: Approximately 2105  I have reviewed the triage notes  Chief Complaint: Epistaxis and Dizziness   History of Present Illness: Sheila [name] is a 56 y.o. female *who states that she's had intermittentnow for the last several days. She states it did bleed earlier today and exclusively from the right nares. She states she had some feelings of dizziness described as lightheadedness at home. No syncopal episode and denies any trauma. Patient denies any fever, chills or productive cough etc.   Past Medical History:  Diagnosis Date  . Anemia   . Arthritis   . Asthma   . COPD (chronic obstructive pulmonary disease) ([place])   . DDD (degenerative disc disease), lumbar   . Diabetes mellitus without complication ([place])    on metformin  . DJD (degenerative joint disease)   . Dysrhythmia   . Fatty liver   . Fibroids   . GERD (gastroesophageal reflux disease)   . Glaucoma   . Hyperlipidemia   . Hypertension   . IBS (irritable bowel syndrome)   . Lumbago   . Neuromuscular disorder ([place])   . 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[place]) 70/6237  . Renal disorder   . Ulcerative colitis ([place])    on mesalamine    Patient Active Problem List   Diagnosis Date Noted  . Primary osteoarthritis of right knee 01/14/2015    Past Surgical History:  Procedure Laterality Date  . COLONOSCOPY WITH PROPOFOL N/A 08/19/2015   Procedure: COLONOSCOPY WITH PROPOFOL;  Surgeon: Lollie Sails, MD;  Location: [facility] ENDOSCOPY;  Service: Endoscopy;  Laterality: N/A;  . COLONOSCOPY WITH PROPOFOL N/A 12/08/2015   Procedure: COLONOSCOPY WITH PROPOFOL;  Surgeon: Lollie Sails, MD;  Location: [facility] ENDOSCOPY;  Service: Endoscopy;  Laterality: N/A;  . COLONOSCOPY WITH PROPOFOL N/A 12/09/2015   Procedure: COLONOSCOPY WITH PROPOFOL;  Surgeon: Lollie Sails, MD;  Location: [facility] ENDOSCOPY;  Service: Endoscopy;  Laterality: N/A;  . JOINT REPLACEMENT Left 04/19/2006  . JOINT REPLACEMENT Right 01/14/2015  . TOTAL KNEE  ARTHROPLASTY Left 2008  . TOTAL KNEE ARTHROPLASTY Right 01/14/2015   Procedure: TOTAL KNEE ARTHROPLASTY;  Surgeon: Hessie Knows, MD;  Location: [facility] ORS;  Service: Orthopedics;  Laterality: Right;    Past Surgical History:  Procedure Laterality Date  . COLONOSCOPY WITH PROPOFOL N/A 08/19/2015   Procedure: COLONOSCOPY WITH PROPOFOL;  Surgeon: Lollie Sails, MD;  Location: [facility] ENDOSCOPY;  Service: Endoscopy;  Laterality: N/A;  . COLONOSCOPY WITH PROPOFOL N/A 12/08/2015   Procedure: COLONOSCOPY WITH PROPOFOL;  Surgeon: Lollie Sails, MD;  Location: [facility] ENDOSCOPY;  Service: Endoscopy;  Laterality: N/A;  . COLONOSCOPY WITH PROPOFOL N/A 12/09/2015   Procedure: COLONOSCOPY WITH PROPOFOL;  Surgeon: Lollie Sails, MD;  Location: [facility] ENDOSCOPY;  Service: Endoscopy;  Laterality: N/A;  . JOINT REPLACEMENT Left 04/19/2006  . JOINT REPLACEMENT Right 01/14/2015  . TOTAL KNEE ARTHROPLASTY Left 2008  . TOTAL KNEE ARTHROPLASTY Right 01/14/2015   Procedure: TOTAL KNEE ARTHROPLASTY;  Surgeon: Hessie Knows, MD;  Location: [facility] ORS;  Service: Orthopedics;  Laterality: Right;    Current Outpatient Rx  . Order #: [id number] Class: Historical Med  . Order #: [id number] Class: Historical Med  . Order #: [id number] Class: Historical Med  . Order #: [id number] Class: Historical Med  . Order #: [id number] Class: Print  . Order #: [id number] Class: Print  . Order #: [id number] Class: Historical Med  . Order #: [id number] Class: Historical Med  . Order #: [id number] Class: Historical Med  . Order #: [id number] Class: Historical Med  . Order #: [id number] Class: Historical  Med  . Order #: [id number] Class: Historical Med  . Order #: [id number] Class: Historical Med  . Order #: [id number] Class: Historical Med  . Order #: [id number] Class: Historical Med  . Order #: [id number] Class: Print  . Order #: [id number] Class: Historical Med  . Order #: [id number] Class: Print  . Order #: [id number] Class: Historical Med  . Order #:  [id number] Class: Historical Med    Allergies:  Ampicillin; Erythromycin; Omeprazole; Penicillins; Quinapril; and Triamterene-hctz  Family History: No family history on file.  Social History: Social History  Substance Use Topics  . Smoking status: Former Smoker    Quit date: 01/04/1978  . Smokeless tobacco: Never Used  . Alcohol use Yes     Comment: Recovering alcoholic since 18/8416     Review of Systems:   10 point review of systems was performed and was otherwise negative:  Constitutional: No fever Eyes: No visual disturbances ENT: No sore throat, ear pain Cardiac: No chest pain Respiratory: No shortness of breath, wheezing, or stridor Abdomen: No abdominal pain, no vomiting, No diarrhea Endocrine: No weight loss, No night sweats Extremities: No peripheral edema, cyanosis Skin: No rashes, easy bruising Neurologic: No focal weakness, trouble with speech or swollowing Urologic: No dysuria, Hematuria, or urinary frequency   Physical Exam:  ED Triage Vitals  Enc Vitals Group     BP 01/31/16 1923 118/67     Pulse Rate 01/31/16 1923 93     Resp 01/31/16 1923 20     Temp 01/31/16 1923 98.1 F (36.7 C)     Temp Source 01/31/16 1923 Oral     SpO2 01/31/16 1923 96 %     Weight 01/31/16 1924 171 lb (77.6 kg)     Height 01/31/16 1924 5' (1.524 m)     Head Circumference --      Peak Flow --      Pain Score 01/31/16 1924 0     Pain Loc --      Pain Edu? --      Excl. in [place]? --     General: Awake , Alert , and Oriented times 3; GCS 15 Head: Normal cephalic , atraumatic Eyes: Pupils equal , round, reactive to light Nose/Throat: No nasal drainage, patent upper airway without erythema or exudate. Patient has a small area of clotted blood within the septal region on the right. No posterior polyposis etc. Neck: Supple, Full range of motion, No anterior adenopathy or palpable thyroid masses Lungs: Clear to ascultation without wheezes , rhonchi, or rales Heart: Regular rate,  regular rhythm without murmurs , gallops , or rubs Abdomen: Soft, non tender without rebound, guarding , or rigidity; bowel sounds positive and symmetric in all 4 quadrants. No organomegaly .        Extremities: 2 plus symmetric pulses. No edema, clubbing or cyanosis Neurologic: normal ambulation, Motor symmetric without deficits, sensory intact Skin: warm, dry, no rashes   Labs:   All laboratory work was reviewed including any pertinent negatives or positives listed below:  Labs Reviewed  COMPREHENSIVE METABOLIC PANEL - Abnormal; Notable for the following:       Result Value   Potassium 2.4 (*)    Chloride 95 (*)    Glucose, Bld 135 (*)    Calcium 8.1 (*)    AST 56 (*)    Anion gap 19 (*)    All other components within normal limits  CBC  TROPONIN I  Potassium is significantly low.  EKG:  ED ECG REPORT I, Daymon Larsen, the  attending physician, personally viewed and interpreted this ECG.  Date: 01/31/2016 EKG Time: 1935 Rate: 89 Rhythm: normal sinus rhythm QRS Axis: normal Intervals: normal ST/T Wave abnormalities: Nonspecific ST-T wave abnormalities Conduction Disturbances: none Narrative Interpretation: unremarkable No acute changes  Procedures:  Patient had nasal packing of the right nares The packing was coated with Neosporin and inserted in the right nares after being trimmed. Patient had inflation of the packing with Afrin nasal spray. He tolerated the procedure well and her was no bleeding or other complications after observation.   ED Course: Patient's allergic to penicillin so I did not discharge her on antibiotics. She was advised to return here if she does develop a fever or any other complications. Her potassium is low and she is on day potassium which advised her to double on the near future and to contact her primary physician for further follow-up and recheck of her potassium level.     Assessment: * Epistaxis Right-sided nasal  packing Hypokalemia   Final Clinical Impression: * Final diagnoses:  Epistaxis     Plan:  Outpatient ENT and primary physician referral. She was advised packing should stay in 5 days Patient was advised to return immediately if condition worsens. Patient was advised to follow up with their primary care physician or other specialized physicians involved in their outpatient care. The patient and/or family member/power of attorney had laboratory results reviewed at the bedside. All questions and concerns were addressed and appropriate discharge instructions were distributed by the nursing staff.             Daymon Larsen, MD 01/31/16 [phone]

## 2016-01-31 NOTE — ED Notes (Signed)
Packing remains in place. Pt verbalized understanding in follow up.

## 2016-01-31 NOTE — ED Notes (Signed)
FIRST NURSE NOTE: Nosebleed for the past several days, no blood thinners. No nosebleed at this time.

## 2016-01-31 NOTE — Discharge Instructions (Signed)
Please take 2 of your potassium tablets a day. Please contact the ED and T for follow-up concerning the nasal packing and removal in the office. Packing removal should occur 5 days from today. If the packing falls out on its own please wait to see if bleeding reoccurs. Drink plenty of fluids and contact your primary physician for follow-up for her low potassium.  Please return immediately if condition worsens. Please contact her primary physician or the physician you were given for referral. If you have any specialist physicians involved in her treatment and plan please also contact them. Thank you for using [facility] emergency [facility].

## 2016-01-31 NOTE — ED Triage Notes (Signed)
Pt reports intermittent nosebleeds and "woozy" feeling for several days; some lightheadedness at this time; no bleeding from nose; denies headache; talking in complete coherent sentences;

## 2016-02-05 ENCOUNTER — Emergency Department
Admission: EM | Admit: 2016-02-05 | Discharge: 2016-02-05 | Disposition: A | Discharge status: Other Acute Inpatient Hospital | Payer: Medicaid Other

## 2016-07-09 ENCOUNTER — Encounter: Payer: Self-pay | Admitting: Medical Oncology

## 2016-07-09 ENCOUNTER — Emergency Department
Admission: EM | Admit: 2016-07-09 | Discharge: 2016-07-09 | Disposition: A | Discharge status: Home / Self Care | Payer: Medicaid Other | Attending: Emergency Medicine | Admitting: Emergency Medicine

## 2016-07-09 ENCOUNTER — Emergency Department: Payer: Medicaid Other

## 2016-07-09 DIAGNOSIS — J449 Chronic obstructive pulmonary disease, unspecified: Secondary | ICD-10-CM | POA: Diagnosis not present

## 2016-07-09 DIAGNOSIS — K219 Gastro-esophageal reflux disease without esophagitis: Secondary | ICD-10-CM | POA: Insufficient documentation

## 2016-07-09 DIAGNOSIS — Z7984 Long term (current) use of oral hypoglycemic drugs: Secondary | ICD-10-CM | POA: Insufficient documentation

## 2016-07-09 DIAGNOSIS — R0789 Other chest pain: Secondary | ICD-10-CM | POA: Diagnosis not present

## 2016-07-09 DIAGNOSIS — I10 Essential (primary) hypertension: Secondary | ICD-10-CM | POA: Diagnosis not present

## 2016-07-09 DIAGNOSIS — E119 Type 2 diabetes mellitus without complications: Secondary | ICD-10-CM | POA: Insufficient documentation

## 2016-07-09 DIAGNOSIS — Z87891 Personal history of nicotine dependence: Secondary | ICD-10-CM | POA: Diagnosis not present

## 2016-07-09 DIAGNOSIS — Z79899 Other long term (current) drug therapy: Secondary | ICD-10-CM | POA: Diagnosis not present

## 2016-07-09 DIAGNOSIS — R079 Chest pain, unspecified: Secondary | ICD-10-CM | POA: Diagnosis present

## 2016-07-09 DIAGNOSIS — J45909 Unspecified asthma, uncomplicated: Secondary | ICD-10-CM | POA: Insufficient documentation

## 2016-07-09 LAB — BASIC METABOLIC PANEL
ANION GAP: 17 — AB (ref 5–15)
BUN: 17 mg/dL (ref 6–20)
CALCIUM: 7.2 mg/dL — AB (ref 8.9–10.3)
CO2: 27 mmol/L (ref 22–32)
Chloride: 95 mmol/L — ABNORMAL LOW (ref 101–111)
Creatinine, Ser: 1.23 mg/dL — ABNORMAL HIGH (ref 0.44–1.00)
GFR, EST AFRICAN AMERICAN: 56 mL/min — AB (ref >60)
GFR, EST NON AFRICAN AMERICAN: 48 mL/min — AB (ref >60)
GLUCOSE: 103 mg/dL — AB (ref 65–99)
POTASSIUM: 3.3 mmol/L — AB (ref 3.5–5.1)
Sodium: 139 mmol/L (ref 135–145)

## 2016-07-09 LAB — CBC WITH DIFFERENTIAL/PLATELET
BASOS ABS: 0.1 10*3/uL (ref 0–0.1)
BASOS PCT: 1 %
Eosinophils Absolute: 0.4 10*3/uL (ref 0–0.7)
Eosinophils Relative: 3 %
HEMATOCRIT: 43.9 % (ref 35.0–47.0)
HEMOGLOBIN: 14.6 g/dL (ref 12.0–16.0)
LYMPHS PCT: 24 %
Lymphs Abs: 2.8 10*3/uL (ref 1.0–3.6)
MCH: 28.1 pg (ref 26.0–34.0)
MCHC: 33.3 g/dL (ref 32.0–36.0)
MCV: 84.2 fL (ref 80.0–100.0)
Monocytes Absolute: 1.4 10*3/uL — ABNORMAL HIGH (ref 0.2–0.9)
Monocytes Relative: 12 %
NEUTROS ABS: 7.1 10*3/uL — AB (ref 1.4–6.5)
Neutrophils Relative %: 60 %
PLATELETS: 175 10*3/uL (ref 150–440)
RBC: 5.22 MIL/uL — AB (ref 3.80–5.20)
RDW: 14 % (ref 11.5–14.5)
WBC: 11.8 10*3/uL — AB (ref 3.6–11.0)

## 2016-07-09 LAB — TROPONIN I: Troponin I: <0.03 ng/mL (ref <0.03)

## 2016-07-09 MED ORDER — FAMOTIDINE 20 MG PO TABS
40.0000 mg | ORAL_TABLET | Freq: Once | ORAL | Status: AC
Start: 2016-07-09 — End: 2016-07-09
  Administered 2016-07-09: 40 mg via ORAL
  Filled 2016-07-09: qty 2

## 2016-07-09 MED ORDER — METOCLOPRAMIDE HCL 10 MG PO TABS: 10.0000 mg | ORAL_TABLET | Freq: Four times a day (QID) | ORAL | 0 refills | Status: DC | PRN

## 2016-07-09 MED ORDER — FAMOTIDINE 20 MG PO TABS: 20.0000 mg | ORAL_TABLET | Freq: Two times a day (BID) | ORAL | 0 refills | Status: DC

## 2016-07-09 MED ORDER — GI COCKTAIL ~~LOC~~
30.0000 mL | ORAL | Status: AC
  Administered 2016-07-09: 30 mL via ORAL
  Filled 2016-07-09: qty 30

## 2016-07-09 MED ORDER — SUCRALFATE 1 G PO TABS: 1.0000 g | ORAL_TABLET | Freq: Four times a day (QID) | ORAL | 1 refills | Status: DC

## 2016-07-09 NOTE — ED Provider Notes (Signed)
[facility] Emergency Department Provider Note  ____________________________________________  Time seen: Approximately 8:15 AM  I have reviewed the triage vital signs and the nursing notes.   HISTORY  Chief Complaint Chest Pain    HPI ULONDA KLOSOWSKI is a 56 y.o. female who complains of being awakened this morning at about 4:00 AM with central chest tightness pain which is nonradiating. Associated with an episode of vomiting, no diaphoresis or shortness of breath. Not exertional, not pleuritic. Not positional. No specific aggravating or alleviating factors. She does note some associated tingling in the right arm, but not radiating pain. She reports having had pain like this in the past which was determined to be acid reflux. She reports compliance with all her medications. Pain is rated as severe.     Past Medical History:  Diagnosis Date  . Anemia   . Arthritis   . Asthma   . COPD (chronic obstructive pulmonary disease) ([place])   . DDD (degenerative disc disease), lumbar   . Diabetes mellitus without complication ([place])    on metformin  . DJD (degenerative joint disease)   . Dysrhythmia   . Fatty liver   . Fibroids   . GERD (gastroesophageal reflux disease)   . Glaucoma   . Hyperlipidemia   . Hypertension   . IBS (irritable bowel syndrome)   . Lumbago   . Neuromuscular disorder ([place])   . 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[place]) 24/5809  . Renal disorder   . Ulcerative colitis ([place])    on mesalamine     Patient Active Problem List   Diagnosis Date Noted  . Primary osteoarthritis of right knee 01/14/2015     Past Surgical History:  Procedure Laterality Date  . COLONOSCOPY WITH PROPOFOL N/A 08/19/2015   Procedure: COLONOSCOPY WITH PROPOFOL;  Surgeon: Lollie Sails, MD;  Location: [facility] ENDOSCOPY;  Service: Endoscopy;  Laterality: N/A;  . COLONOSCOPY WITH PROPOFOL N/A 12/08/2015   Procedure: COLONOSCOPY WITH PROPOFOL;  Surgeon: Lollie Sails, MD;   Location: [facility] ENDOSCOPY;  Service: Endoscopy;  Laterality: N/A;  . COLONOSCOPY WITH PROPOFOL N/A 12/09/2015   Procedure: COLONOSCOPY WITH PROPOFOL;  Surgeon: Lollie Sails, MD;  Location: [facility] ENDOSCOPY;  Service: Endoscopy;  Laterality: N/A;  . JOINT REPLACEMENT Left 04/19/2006  . JOINT REPLACEMENT Right 01/14/2015  . TOTAL KNEE ARTHROPLASTY Left 2008  . TOTAL KNEE ARTHROPLASTY Right 01/14/2015   Procedure: TOTAL KNEE ARTHROPLASTY;  Surgeon: Hessie Knows, MD;  Location: [facility] ORS;  Service: Orthopedics;  Laterality: Right;     Prior to Admission medications   Medication Sig Start Date End Date Taking? Authorizing Provider  brinzolamide (AZOPT) 1 % ophthalmic suspension Place 1 drop into both eyes 3 (three) times daily.   Yes [provider]  chlorthalidone (HYGROTON) 25 MG tablet Take 25 mg by mouth daily.  01/04/15  Yes [provider]  colchicine 0.6 MG tablet Take 0.6 mg by mouth daily.   Yes [provider]  KLOR-CON M20 20 MEQ tablet Take 2 tablets by mouth 2 (two) times daily. 01/01/15  Yes [provider]  latanoprost (XALATAN) 0.005 % ophthalmic solution Place 1 drop into both eyes at bedtime. 10/01/14  Yes [provider]  losartan (COZAAR) 25 MG tablet Take 25 mg by mouth daily.    Yes [provider]  mesalamine (APRISO) 0.375 G 24 hr capsule Take 375 mg by mouth daily.    Yes [provider]  metFORMIN (GLUCOPHAGE-XR) 500 MG 24 hr tablet Take 500 mg  by mouth 2 (two) times daily.    Yes [provider]  metoprolol succinate (TOPROL-XL) 25 MG 24 hr tablet Take 1 tablet (25 mg total) by mouth daily. 01/17/15  Yes Duanne Guess, PA-C  MUCINEX 600 MG 12 hr tablet Take 1 tablet by mouth 2 (two) times daily as needed for cough.  01/03/15  Yes [provider]  pantoprazole (PROTONIX) 40 MG tablet Take 40 mg by mouth every morning.   Yes [provider]  pravastatin (PRAVACHOL) 10 MG tablet Take  10 mg by mouth at bedtime.   Yes [provider]  albuterol (PROVENTIL HFA;VENTOLIN HFA) 108 (90 BASE) MCG/ACT inhaler Inhale 2 puffs into the lungs every 4 (four) hours as needed for wheezing or shortness of breath.    [provider]  diazepam (VALIUM) 5 MG tablet Take 1 tablet (5 mg total) by mouth every 6 (six) hours as needed for anxiety. Patient not taking: Reported on 07/09/2016 01/17/15   Duanne Guess, PA-C  enoxaparin (LOVENOX) 40 MG/0.4ML injection Inject 0.4 mLs (40 mg total) into the skin daily. Patient not taking: Reported on 07/09/2016 01/17/15   Duanne Guess, PA-C  famotidine (PEPCID) 20 MG tablet Take 1 tablet (20 mg total) by mouth 2 (two) times daily. 07/09/16   Sadeel Mew, MD  ibuprofen (ADVIL,MOTRIN) 800 MG tablet Take 800 mg by mouth every 8 (eight) hours as needed for mild pain.     [provider]  metoCLOPramide (REGLAN) 10 MG tablet Take 1 tablet (10 mg total) by mouth every 6 (six) hours as needed. 07/09/16   Davine Mew, MD  oxyCODONE (OXY IR/ROXICODONE) 5 MG immediate release tablet Take 1-2 tablets (5-10 mg total) by mouth every 3 (three) hours as needed for moderate pain. Patient not taking: Reported on 07/09/2016 01/17/15   Duanne Guess, PA-C  sucralfate (CARAFATE) 1 g tablet Take 1 tablet (1 g total) by mouth 4 (four) times daily. 07/09/16   Yazhini Mew, MD     Allergies Ampicillin; Erythromycin; Omeprazole; Penicillins; Quinapril; and Triamterene-hctz   No family history on file.  Social History Social History  Substance Use Topics  . Smoking status: Former Smoker    Quit date: 01/04/1978  . Smokeless tobacco: Never Used  . Alcohol use Yes     Comment: Recovering alcoholic since 66/0630    Review of Systems  Constitutional:   No fever or chills.  ENT:   No sore throat. No rhinorrhea. Cardiovascular:   Positive as above chest pain without dizziness or syncope. Respiratory:   No dyspnea or  cough. Gastrointestinal:   Negative for abdominal pain, positive one episode vomiting. No diarrhea or constipation.  Musculoskeletal:   Negative for focal pain or swelling All other systems reviewed and are negative except as documented above in ROS and HPI.  ____________________________________________   PHYSICAL EXAM:  VITAL SIGNS: ED Triage Vitals  Enc Vitals Group     BP 07/09/16 0802 91/78     Pulse Rate 07/09/16 0802 93     Resp 07/09/16 0802 18     Temp 07/09/16 0802 97.8 F (36.6 C)     Temp Source 07/09/16 0802 Oral     SpO2 07/09/16 0802 95 %     Weight 07/09/16 0803 171 lb (77.6 kg)     Height 07/09/16 0803 5' (1.524 m)     Head Circumference --      Peak Flow --      Pain Score 07/09/16 0801  10     Pain Loc --      Pain Edu? --      Excl. in [place]? --     Vital signs reviewed, nursing assessments reviewed.   Constitutional:   Alert and oriented. Well appearing and in no distress. Eyes:   No scleral icterus.  EOMI. No nystagmus. No conjunctival pallor. PERRL. ENT   Head:   Normocephalic and atraumatic.   Nose:   No congestion/rhinnorhea.    Mouth/Throat:   MMM, no pharyngeal erythema. No peritonsillar mass.    Neck:   No meningismus. Full ROM Hematological/Lymphatic/Immunilogical:   No cervical lymphadenopathy. Cardiovascular:   RRR. Symmetric bilateral radial and DP pulses.  No murmurs.  Respiratory:   Normal respiratory effort without tachypnea/retractions. Breath sounds are clear and equal bilaterally. No wheezes/rales/rhonchi. Gastrointestinal:   Truncal obesity. Soft and nontender. Non distended. There is no CVA tenderness.  No rebound, rigidity, or guarding. Genitourinary:   deferred Musculoskeletal:   Normal range of motion in all extremities. No joint effusions.  No lower extremity tenderness.  No edema. Neurologic:   Normal speech and language.  Motor grossly intact. No gross focal neurologic deficits are appreciated.  Skin:    Skin is  warm, dry and intact. No rash noted.  No petechiae, purpura, or bullae.  ____________________________________________    LABS (pertinent positives/negatives) (all labs ordered are listed, but only abnormal results are displayed) Labs Reviewed  BASIC METABOLIC PANEL - Abnormal; Notable for the following:       Result Value   Potassium 3.3 (*)    Chloride 95 (*)    Glucose, Bld 103 (*)    Creatinine, Ser 1.23 (*)    Calcium 7.2 (*)    GFR calc non Af Amer 48 (*)    GFR calc Af Amer 56 (*)    Anion gap 17 (*)    All other components within normal limits  CBC WITH DIFFERENTIAL/PLATELET - Abnormal; Notable for the following:    WBC 11.8 (*)    RBC 5.22 (*)    Neutro Abs 7.1 (*)    Monocytes Absolute 1.4 (*)    All other components within normal limits  TROPONIN I  TROPONIN I   ____________________________________________   EKG  Interpreted by me Normal sinus rhythm rate of 94, normal axis and intervals. Normal QRS and ST segments. Nonacute, nonischemic T wave inversions in 3 aVF and V3 through V6, which is unchanged from 12/24/2014.  ____________________________________________    RADIOLOGY  Dg Chest 2 View  Result Date: 07/09/2016 CLINICAL DATA:  Woke up with chest pain and tightness at 3 a.m. Right arm pain. EXAM: CHEST  2 VIEW COMPARISON:  11/11/2013 FINDINGS: Old left rib fractures. Lungs are clear without airspace disease or pulmonary edema. There is a rounded density in the right upper chest which may be related to a skin cardiac lead. Mild blunting at the left costophrenic angle appears chronic. Heart and mediastinum are within normal limits. The trachea is midline. Negative for a pneumothorax. Degenerative endplate changes in the thoracic spine. No large pleural effusions. Old left clavicle fracture. IMPRESSION: No active cardiopulmonary disease. Electronically Signed   By: Markus Daft M.D.   On: 07/09/2016 08:56     ____________________________________________   PROCEDURES Procedures  ____________________________________________   INITIAL IMPRESSION / ASSESSMENT AND PLAN / ED COURSE  Pertinent labs & imaging results that were available during my care of the patient were reviewed by me and considered in my medical decision making (  see chart for details).  Patient presents with chest tightness, atypical symptom pattern. Low risk chest pain.Considering the patient's symptoms, medical history, and physical examination today, I have low suspicion for ACS, PE, TAD, pneumothorax, carditis, mediastinitis, pneumonia, CHF, or sepsis.  Higher suspicion for acid reflux. We'll treat with Pepcid and GI cocktail while getting chest x-ray, labs, delta troponin.       ----------------------------------------- 11:43 AM on 07/09/2016 -----------------------------------------  Labs unremarkable. Patient feeling better after antacids. I will continue her on aggressive antacid regimen, follow up with primary care doctor. Does have some evidence of acute renal insufficiency, secondary to dehydration. This can be expected to improve with better hydration. Primary care can perform and follow up on serial labs. ____________________________________________   FINAL CLINICAL IMPRESSION(S) / ED DIAGNOSES  Final diagnoses:  Atypical chest pain  Gastroesophageal reflux disease, esophagitis presence not specified      New Prescriptions   FAMOTIDINE (PEPCID) 20 MG TABLET    Take 1 tablet (20 mg total) by mouth 2 (two) times daily.   METOCLOPRAMIDE (REGLAN) 10 MG TABLET    Take 1 tablet (10 mg total) by mouth every 6 (six) hours as needed.   SUCRALFATE (CARAFATE) 1 G TABLET    Take 1 tablet (1 g total) by mouth 4 (four) times daily.     Portions of this note were generated with dragon dictation software. Dictation errors may occur despite best attempts at proofreading.    Nova Mew, MD 07/09/16  1144

## 2016-07-09 NOTE — ED Triage Notes (Signed)
Pt reports that she woke up this am with central chest tightness, numbness in legs and arms and nausea with vomiting. Pt in NAD.

## 2016-07-09 NOTE — ED Notes (Signed)
Patient transported to X-ray 

## 2016-07-11 ENCOUNTER — Emergency Department: Payer: Medicaid Other

## 2016-07-11 ENCOUNTER — Emergency Department
Admission: EM | Admit: 2016-07-11 | Discharge: 2016-07-11 | Disposition: A | Discharge status: Home / Self Care | Payer: Medicaid Other | Attending: Emergency Medicine | Admitting: Emergency Medicine

## 2016-07-11 ENCOUNTER — Encounter: Payer: Self-pay | Admitting: Emergency Medicine

## 2016-07-11 DIAGNOSIS — I10 Essential (primary) hypertension: Secondary | ICD-10-CM | POA: Insufficient documentation

## 2016-07-11 DIAGNOSIS — R1013 Epigastric pain: Secondary | ICD-10-CM | POA: Diagnosis not present

## 2016-07-11 DIAGNOSIS — J449 Chronic obstructive pulmonary disease, unspecified: Secondary | ICD-10-CM | POA: Diagnosis not present

## 2016-07-11 DIAGNOSIS — Z87891 Personal history of nicotine dependence: Secondary | ICD-10-CM | POA: Diagnosis not present

## 2016-07-11 DIAGNOSIS — F1010 Alcohol abuse, uncomplicated: Secondary | ICD-10-CM

## 2016-07-11 DIAGNOSIS — E119 Type 2 diabetes mellitus without complications: Secondary | ICD-10-CM | POA: Insufficient documentation

## 2016-07-11 DIAGNOSIS — R0789 Other chest pain: Secondary | ICD-10-CM | POA: Insufficient documentation

## 2016-07-11 DIAGNOSIS — J45909 Unspecified asthma, uncomplicated: Secondary | ICD-10-CM | POA: Insufficient documentation

## 2016-07-11 DIAGNOSIS — R7989 Other specified abnormal findings of blood chemistry: Secondary | ICD-10-CM | POA: Insufficient documentation

## 2016-07-11 DIAGNOSIS — Z79899 Other long term (current) drug therapy: Secondary | ICD-10-CM | POA: Insufficient documentation

## 2016-07-11 DIAGNOSIS — R079 Chest pain, unspecified: Secondary | ICD-10-CM | POA: Diagnosis present

## 2016-07-11 DIAGNOSIS — R945 Abnormal results of liver function studies: Secondary | ICD-10-CM

## 2016-07-11 LAB — BASIC METABOLIC PANEL
Anion gap: 15 (ref 5–15)
BUN: 16 mg/dL (ref 6–20)
CHLORIDE: 99 mmol/L — AB (ref 101–111)
CO2: 27 mmol/L (ref 22–32)
CREATININE: 1.24 mg/dL — AB (ref 0.44–1.00)
Calcium: 7 mg/dL — ABNORMAL LOW (ref 8.9–10.3)
GFR calc non Af Amer: 48 mL/min — ABNORMAL LOW (ref >60)
GFR, EST AFRICAN AMERICAN: 56 mL/min — AB (ref >60)
Glucose, Bld: 115 mg/dL — ABNORMAL HIGH (ref 65–99)
Potassium: 3.3 mmol/L — ABNORMAL LOW (ref 3.5–5.1)
Sodium: 141 mmol/L (ref 135–145)

## 2016-07-11 LAB — CBC
HCT: 45 % (ref 35.0–47.0)
HEMOGLOBIN: 14.8 g/dL (ref 12.0–16.0)
MCH: 27.3 pg (ref 26.0–34.0)
MCHC: 32.9 g/dL (ref 32.0–36.0)
MCV: 83.2 fL (ref 80.0–100.0)
PLATELETS: 176 10*3/uL (ref 150–440)
RBC: 5.41 MIL/uL — AB (ref 3.80–5.20)
RDW: 13.9 % (ref 11.5–14.5)
WBC: 10.6 10*3/uL (ref 3.6–11.0)

## 2016-07-11 LAB — HEPATIC FUNCTION PANEL
ALBUMIN: 4.2 g/dL (ref 3.5–5.0)
ALK PHOS: 77 U/L (ref 38–126)
ALT: 193 U/L — ABNORMAL HIGH (ref 14–54)
AST: 259 U/L — AB (ref 15–41)
BILIRUBIN TOTAL: 2.3 mg/dL — AB (ref 0.3–1.2)
Bilirubin, Direct: 0.8 mg/dL — ABNORMAL HIGH (ref 0.1–0.5)
Indirect Bilirubin: 1.5 mg/dL — ABNORMAL HIGH (ref 0.3–0.9)
Total Protein: 7.3 g/dL (ref 6.5–8.1)

## 2016-07-11 LAB — LIPASE, BLOOD: Lipase: 32 U/L (ref 11–51)

## 2016-07-11 LAB — TROPONIN I: Troponin I: <0.03 ng/mL (ref <0.03)

## 2016-07-11 MED ORDER — IOPAMIDOL (ISOVUE-370) INJECTION 76%
100.0000 mL | Freq: Once | INTRAVENOUS | Status: AC | PRN
  Administered 2016-07-11: 100 mL via INTRAVENOUS

## 2016-07-11 MED ORDER — MORPHINE SULFATE (PF) 4 MG/ML IV SOLN
4.0000 mg | Freq: Once | INTRAVENOUS | Status: AC
  Administered 2016-07-11: 4 mg via INTRAVENOUS
  Filled 2016-07-11: qty 1

## 2016-07-11 NOTE — ED Provider Notes (Signed)
Care signed over from Dr. Joni Fears pending results of CT scan. CT is without any acute disease. I offered the patient an ultrasound to evaluate her right upper quadrant and her elevated LFTs and bilirubin however she declined. [name] drinking and eating in front of me. Her pain is improved. She has GI follow-up. She will be discharged home with family in improved condition.   Darel Hong, MD 07/11/16 2150

## 2016-07-11 NOTE — ED Triage Notes (Signed)
Patient presents to the ED with squeezing, tight pain in her mid chest radiating  Under her left breast and into her epigastric area.  Patient states she has occasionally had numbness/tingling bilaterally to her hands as well as some shortness of breath when the pain is severe.  Patient states this episode of pain began yesterday evening but the first time patient had this pain was 2 days ago and she was seen in the ED for the same.  Patient is in no obvious distress at this time.

## 2016-07-11 NOTE — ED Notes (Signed)
NAD noted at this time. Pt resting in bed with family at bedside at this time. IV started at this time, explained to patient would need for test. Pt states understanding at this time. Will continue to monitor for further patient needs.

## 2016-07-11 NOTE — Discharge Instructions (Signed)
Please keep your follow-up appointment with gastroenterology as scheduled. Return to the emergency department for any concerns such as fevers, chills, if you cannot eat or drink, or for any other concerns whatsoever.  It was a pleasure to take care of you today, and thank you for coming to our emergency department.  If you have any questions or concerns before leaving please ask the nurse to grab me and I'm more than happy to go through your aftercare instructions again.  If you were prescribed any opioid pain medication today such as Norco, Vicodin, Percocet, morphine, hydrocodone, or oxycodone please make sure you do not drive when you are taking this medication as it can alter your ability to drive safely.  If you have any concerns once you are home that you are not improving or are in fact getting worse before you can make it to your follow-up appointment, please do not hesitate to call 911 and come back for further evaluation.  Darel Hong MD  Results for orders placed or performed during the hospital encounter of 16/10/96  Basic metabolic panel  Result Value Ref Range   Sodium 141 135 - 145 mmol/L   Potassium 3.3 (L) 3.5 - 5.1 mmol/L   Chloride 99 (L) 101 - 111 mmol/L   CO2 27 22 - 32 mmol/L   Glucose, Bld 115 (H) 65 - 99 mg/dL   BUN 16 6 - 20 mg/dL   Creatinine, Ser 1.24 (H) 0.44 - 1.00 mg/dL   Calcium 7.0 (L) 8.9 - 10.3 mg/dL   GFR calc non Af Amer 48 (L) >60 mL/min   GFR calc Af Amer 56 (L) >60 mL/min   Anion gap 15 5 - 15  CBC  Result Value Ref Range   WBC 10.6 3.6 - 11.0 K/uL   RBC 5.41 (H) 3.80 - 5.20 MIL/uL   Hemoglobin 14.8 12.0 - 16.0 g/dL   HCT 45.0 35.0 - 47.0 %   MCV 83.2 80.0 - 100.0 fL   MCH 27.3 26.0 - 34.0 pg   MCHC 32.9 32.0 - 36.0 g/dL   RDW 13.9 11.5 - 14.5 %   Platelets 176 150 - 440 K/uL  Troponin I  Result Value Ref Range   Troponin I <0.03 <0.03 ng/mL  Lipase, blood  Result Value Ref Range   Lipase 32 11 - 51 U/L  Hepatic function panel    Result Value Ref Range   Total Protein 7.3 6.5 - 8.1 g/dL   Albumin 4.2 3.5 - 5.0 g/dL   AST 259 (H) 15 - 41 U/L   ALT 193 (H) 14 - 54 U/L   Alkaline Phosphatase 77 38 - 126 U/L   Total Bilirubin 2.3 (H) 0.3 - 1.2 mg/dL   Bilirubin, Direct 0.8 (H) 0.1 - 0.5 mg/dL   Indirect Bilirubin 1.5 (H) 0.3 - 0.9 mg/dL   Dg Chest 2 View  Result Date: 07/11/2016 CLINICAL DATA:  Mid chest pain EXAM: CHEST  2 VIEW COMPARISON:  07/09/2016 FINDINGS: Chronic left rib and clavicular deformities, stable, from old healed fractures. Thoracic spondylosis. Bandlike densities primarily in the posterior basal segment left lower lobe compatible with atelectasis or scarring. The lungs appear otherwise clear. Cardiac and mediastinal margins appear normal. IMPRESSION: 1. Subsegmental atelectasis or scarring in the left lower lobe. 2. Thoracic spondylosis. 3. Old left rib deformities and old left clavicular for deformity. Electronically Signed   By: Van Clines M.D.   On: 07/11/2016 16:10   Dg Chest 2 View  Result  Date: 07/09/2016 CLINICAL DATA:  Woke up with chest pain and tightness at 3 a.m. Right arm pain. EXAM: CHEST  2 VIEW COMPARISON:  11/11/2013 FINDINGS: Old left rib fractures. Lungs are clear without airspace disease or pulmonary edema. There is a rounded density in the right upper chest which may be related to a skin cardiac lead. Mild blunting at the left costophrenic angle appears chronic. Heart and mediastinum are within normal limits. The trachea is midline. Negative for a pneumothorax. Degenerative endplate changes in the thoracic spine. No large pleural effusions. Old left clavicle fracture. IMPRESSION: No active cardiopulmonary disease. Electronically Signed   By: Markus Daft M.D.   On: 07/09/2016 08:56   Ct Angio Chest Pe W And/or Wo Contrast  Result Date: 07/11/2016 CLINICAL DATA:  Initial evaluation for acute chest pain extending into epigastric region, with diffuse abdominal pain. EXAM: CT ANGIOGRAPHY  CHEST CT ABDOMEN AND PELVIS WITH CONTRAST TECHNIQUE: Multidetector CT imaging of the chest was performed using the standard protocol during bolus administration of intravenous contrast. Multiplanar CT image reconstructions and MIPs were obtained to evaluate the vascular anatomy. Multidetector CT imaging of the abdomen and pelvis was performed using the standard protocol during bolus administration of intravenous contrast. CONTRAST:  100 cc of Isovue 370. COMPARISON:  Prior radiograph from earlier the same day. FINDINGS: CTA CHEST FINDINGS Cardiovascular: Intrathoracic aorta of normal caliber without aneurysm or acute abnormality. Visualized great vessels within normal limits. Heart size within normal limits. No pericardial effusion. Pulmonary arterial tree adequately opacified for evaluation. Main pulmonary artery within normal limits for size measuring 2 cm in diameter. No discrete filling defect to suggest acute pulmonary embolism. Re-formatted imaging confirms these findings. Mild heterogeneity knee with an lingular branches without convincing evidence for acute embolus. Mediastinum/Nodes: Thyroid within normal limits. No pathologically enlarged mediastinal, hilar, or axillary lymph nodes. Esophagus within normal limits. Lungs/Pleura: Tracheobronchial tree is patent. Mild subsegmental atelectatic changes at the bilateral lung bases. Lungs are otherwise clear without focal infiltrate, pulmonary edema, or pleural effusion. No pneumothorax. No worrisome pulmonary nodule or mass. Musculoskeletal: No acute osseous abnormality. No worrisome lytic or blastic osseous lesions. Degenerative spurring noted within the midthoracic spine. Remotely healed left-sided rib fractures with additional remote fracture of the left clavicle noted. Review of the MIP images confirms the above findings. CT ABDOMEN and PELVIS FINDINGS Hepatobiliary: Diffuse hypoattenuation of the liver consistent with steatosis. Liver otherwise  unremarkable. Gallbladder within normal limits. No biliary dilatation. Pancreas: Pancreas within normal limits. Spleen: Spleen within normal limits. Adrenals/Urinary Tract: The adrenal glands are normal. Kidneys equal in size with symmetric enhancement. No nephrolithiasis, hydronephrosis, or focal enhancing renal mass. No hydroureter. Bladder partially distended without acute abnormality. Stomach/Bowel: Stomach within normal limits. No evidence for bowel obstruction. Appendix normal. Mild colonic diverticulosis without evidence for diverticulitis. No acute inflammatory changes seen about the bowels. Vascular/Lymphatic: Normal intravascular enhancement seen throughout the intra-abdominal aorta and its branch vessels. No aneurysm. No adenopathy. Reproductive: Fibroid uterus noted. Ovaries within normal limits. Tubal ligation clips noted. Other: No free air or fluid. Musculoskeletal: No acute osseous abnormality. No worrisome lytic or blastic osseous lesions. Bilateral facet arthrosis noted within the lower lumbar spine. Review of the MIP images confirms the above findings. IMPRESSION: 1. No CTA evidence for acute pulmonary embolism. 2. Mild bibasilar subsegmental atelectasis. No other active cardiopulmonary disease. 3. No acute intra-abdominal or pelvic process identified. 4. Mild colonic diverticulosis without evidence for acute diverticulitis. 5. Fibroid uterus. 6. Hepatic steatosis. Electronically Signed  By: Jeannine Boga M.D.   On: 07/11/2016 21:31   Ct Abdomen Pelvis W Contrast  Result Date: 07/11/2016 CLINICAL DATA:  Initial evaluation for acute chest pain extending into epigastric region, with diffuse abdominal pain. EXAM: CT ANGIOGRAPHY CHEST CT ABDOMEN AND PELVIS WITH CONTRAST TECHNIQUE: Multidetector CT imaging of the chest was performed using the standard protocol during bolus administration of intravenous contrast. Multiplanar CT image reconstructions and MIPs were obtained to evaluate the  vascular anatomy. Multidetector CT imaging of the abdomen and pelvis was performed using the standard protocol during bolus administration of intravenous contrast. CONTRAST:  100 cc of Isovue 370. COMPARISON:  Prior radiograph from earlier the same day. FINDINGS: CTA CHEST FINDINGS Cardiovascular: Intrathoracic aorta of normal caliber without aneurysm or acute abnormality. Visualized great vessels within normal limits. Heart size within normal limits. No pericardial effusion. Pulmonary arterial tree adequately opacified for evaluation. Main pulmonary artery within normal limits for size measuring 2 cm in diameter. No discrete filling defect to suggest acute pulmonary embolism. Re-formatted imaging confirms these findings. Mild heterogeneity knee with an lingular branches without convincing evidence for acute embolus. Mediastinum/Nodes: Thyroid within normal limits. No pathologically enlarged mediastinal, hilar, or axillary lymph nodes. Esophagus within normal limits. Lungs/Pleura: Tracheobronchial tree is patent. Mild subsegmental atelectatic changes at the bilateral lung bases. Lungs are otherwise clear without focal infiltrate, pulmonary edema, or pleural effusion. No pneumothorax. No worrisome pulmonary nodule or mass. Musculoskeletal: No acute osseous abnormality. No worrisome lytic or blastic osseous lesions. Degenerative spurring noted within the midthoracic spine. Remotely healed left-sided rib fractures with additional remote fracture of the left clavicle noted. Review of the MIP images confirms the above findings. CT ABDOMEN and PELVIS FINDINGS Hepatobiliary: Diffuse hypoattenuation of the liver consistent with steatosis. Liver otherwise unremarkable. Gallbladder within normal limits. No biliary dilatation. Pancreas: Pancreas within normal limits. Spleen: Spleen within normal limits. Adrenals/Urinary Tract: The adrenal glands are normal. Kidneys equal in size with symmetric enhancement. No nephrolithiasis,  hydronephrosis, or focal enhancing renal mass. No hydroureter. Bladder partially distended without acute abnormality. Stomach/Bowel: Stomach within normal limits. No evidence for bowel obstruction. Appendix normal. Mild colonic diverticulosis without evidence for diverticulitis. No acute inflammatory changes seen about the bowels. Vascular/Lymphatic: Normal intravascular enhancement seen throughout the intra-abdominal aorta and its branch vessels. No aneurysm. No adenopathy. Reproductive: Fibroid uterus noted. Ovaries within normal limits. Tubal ligation clips noted. Other: No free air or fluid. Musculoskeletal: No acute osseous abnormality. No worrisome lytic or blastic osseous lesions. Bilateral facet arthrosis noted within the lower lumbar spine. Review of the MIP images confirms the above findings. IMPRESSION: 1. No CTA evidence for acute pulmonary embolism. 2. Mild bibasilar subsegmental atelectasis. No other active cardiopulmonary disease. 3. No acute intra-abdominal or pelvic process identified. 4. Mild colonic diverticulosis without evidence for acute diverticulitis. 5. Fibroid uterus. 6. Hepatic steatosis. Electronically Signed   By: Jeannine Boga M.D.   On: 07/11/2016 21:31

## 2016-07-11 NOTE — ED Provider Notes (Signed)
[facility] Emergency Department Provider Note  ____________________________________________  Time seen: Approximately 7:46 PM  I have reviewed the triage vital signs and the nursing notes.   HISTORY  Chief Complaint Chest Pain    HPI Sheila [name] is a 56 y.o. female who complains of chest pain in the center anterior chest, radiating to the epigastric area, described as squeezing and tightness. No aggravating or alleviating factors. Associated with nausea, no vomiting. No constipation or diarrhea. No shortness of breath diaphoresis dizziness or syncope. Reports the pain is similar to what she came to the ED for 2 days ago when I evaluated her then. [name] not sure she's been taking the recommended medications that I prescribed that would treat for possible gastritis. She does have the medications with her including Reglan Pepcid and Carafate.  She has a history of ulcerative colitis, questionable medication compliance. Also a daily drinker.     Past Medical History:  Diagnosis Date  . Anemia   . Arthritis   . Asthma   . COPD (chronic obstructive pulmonary disease) ([place])   . DDD (degenerative disc disease), lumbar   . Diabetes mellitus without complication ([place])    on metformin  . DJD (degenerative joint disease)   . Dysrhythmia   . Fatty liver   . Fibroids   . GERD (gastroesophageal reflux disease)   . Glaucoma   . Hyperlipidemia   . Hypertension   . IBS (irritable bowel syndrome)   . Lumbago   . Neuromuscular disorder ([place])   . 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[place]) 97/6734  . Renal disorder   . Ulcerative colitis ([place])    on mesalamine     Patient Active Problem List   Diagnosis Date Noted  . Primary osteoarthritis of right knee 01/14/2015     Past Surgical History:  Procedure Laterality Date  . COLONOSCOPY WITH PROPOFOL N/A 08/19/2015   Procedure: COLONOSCOPY WITH PROPOFOL;  Surgeon: Lollie Sails, MD;  Location: [facility] ENDOSCOPY;   Service: Endoscopy;  Laterality: N/A;  . COLONOSCOPY WITH PROPOFOL N/A 12/08/2015   Procedure: COLONOSCOPY WITH PROPOFOL;  Surgeon: Lollie Sails, MD;  Location: [facility] ENDOSCOPY;  Service: Endoscopy;  Laterality: N/A;  . COLONOSCOPY WITH PROPOFOL N/A 12/09/2015   Procedure: COLONOSCOPY WITH PROPOFOL;  Surgeon: Lollie Sails, MD;  Location: [facility] ENDOSCOPY;  Service: Endoscopy;  Laterality: N/A;  . JOINT REPLACEMENT Left 04/19/2006  . JOINT REPLACEMENT Right 01/14/2015  . TOTAL KNEE ARTHROPLASTY Left 2008  . TOTAL KNEE ARTHROPLASTY Right 01/14/2015   Procedure: TOTAL KNEE ARTHROPLASTY;  Surgeon: Hessie Knows, MD;  Location: [facility] ORS;  Service: Orthopedics;  Laterality: Right;     Prior to Admission medications   Medication Sig Start Date End Date Taking? Authorizing Provider  albuterol (PROVENTIL HFA;VENTOLIN HFA) 108 (90 BASE) MCG/ACT inhaler Inhale 2 puffs into the lungs every 4 (four) hours as needed for wheezing or shortness of breath.   Yes [provider]  brinzolamide (AZOPT) 1 % ophthalmic suspension Place 1 drop into both eyes 3 (three) times daily.   Yes [provider]  chlorthalidone (HYGROTON) 25 MG tablet Take 25 mg by mouth daily.  01/04/15  Yes [provider]  colchicine 0.6 MG tablet Take 0.6 mg by mouth daily.   Yes [provider]  famotidine (PEPCID) 20 MG tablet Take 1 tablet (20 mg total) by mouth 2 (two) times daily. 07/09/16  Yes Rayden Mew, MD  ibuprofen (ADVIL,MOTRIN) 800 MG tablet Take 800 mg by mouth every  8 (eight) hours as needed for mild pain.    Yes [provider]  KLOR-CON M20 20 MEQ tablet Take 2 tablets by mouth 2 (two) times daily. 01/01/15  Yes [provider]  latanoprost (XALATAN) 0.005 % ophthalmic solution Place 1 drop into both eyes at bedtime. 10/01/14  Yes [provider]  losartan (COZAAR) 25 MG tablet Take 25 mg by mouth daily.    Yes [provider]  mesalamine  (APRISO) 0.375 G 24 hr capsule Take 375 mg by mouth daily.    Yes [provider]  metFORMIN (GLUCOPHAGE-XR) 500 MG 24 hr tablet Take 500 mg by mouth 2 (two) times daily.    Yes [provider]  metoCLOPramide (REGLAN) 10 MG tablet Take 1 tablet (10 mg total) by mouth every 6 (six) hours as needed. 07/09/16  Yes Yocheved Mew, MD  metoprolol succinate (TOPROL-XL) 25 MG 24 hr tablet Take 1 tablet (25 mg total) by mouth daily. 01/17/15  Yes Duanne Guess, PA-C  MUCINEX 600 MG 12 hr tablet Take 1 tablet by mouth 2 (two) times daily as needed for cough.  01/03/15  Yes [provider]  pantoprazole (PROTONIX) 40 MG tablet Take 40 mg by mouth every morning.   Yes [provider]  pravastatin (PRAVACHOL) 10 MG tablet Take 10 mg by mouth at bedtime.   Yes [provider]  sucralfate (CARAFATE) 1 g tablet Take 1 tablet (1 g total) by mouth 4 (four) times daily. 07/09/16  Yes Sweetie Mew, MD  diazepam (VALIUM) 5 MG tablet Take 1 tablet (5 mg total) by mouth every 6 (six) hours as needed for anxiety. Patient not taking: Reported on 07/09/2016 01/17/15   Duanne Guess, PA-C  enoxaparin (LOVENOX) 40 MG/0.4ML injection Inject 0.4 mLs (40 mg total) into the skin daily. Patient not taking: Reported on 07/09/2016 01/17/15   Duanne Guess, PA-C  oxyCODONE (OXY IR/ROXICODONE) 5 MG immediate release tablet Take 1-2 tablets (5-10 mg total) by mouth every 3 (three) hours as needed for moderate pain. Patient not taking: Reported on 07/09/2016 01/17/15   Duanne Guess, PA-C     Allergies Ampicillin; Erythromycin; Omeprazole; Penicillins; Quinapril; and Triamterene-hctz   No family history on file.  Social History Social History  Substance Use Topics  . Smoking status: Former Smoker    Quit date: 01/04/1978  . Smokeless tobacco: Never Used  . Alcohol use Yes     Comment: Recovering alcoholic since 26/9485    Review of Systems  Constitutional:   No fever or  chills.  ENT:   No sore throat. No rhinorrhea. Cardiovascular:   Positive as above for chest pain without syncope. Respiratory:   No dyspnea or cough. Gastrointestinal:   Positive epigastric pain without vomiting or diarrhea  Musculoskeletal:   Negative for focal pain or swelling All other systems reviewed and are negative except as documented above in ROS and HPI.  ____________________________________________   PHYSICAL EXAM:  VITAL SIGNS: ED Triage Vitals  Enc Vitals Group     BP 07/11/16 1521 119/78     Pulse Rate 07/11/16 1521 91     Resp 07/11/16 1521 18     Temp 07/11/16 1521 (!) 97.4 F (36.3 C)     Temp Source 07/11/16 1521 Oral     SpO2 07/11/16 1521 97 %     Weight 07/11/16 1521 171 lb (77.6 kg)     Height 07/11/16 1521 5' (1.524 m)     Head Circumference --  Peak Flow --      Pain Score 07/11/16 1520 10     Pain Loc --      Pain Edu? --      Excl. in [place]? --     Vital signs reviewed, nursing assessments reviewed.   Constitutional:   Alert and oriented. Well appearing and in no distress. Eyes:   No scleral icterus.  EOMI. No nystagmus. No conjunctival pallor. PERRL. ENT   Head:   Normocephalic and atraumatic.   Nose:   No congestion/rhinnorhea.    Mouth/Throat:   MMM, no pharyngeal erythema. No peritonsillar mass.    Neck:   No meningismus. Full ROM Hematological/Lymphatic/Immunilogical:   No cervical lymphadenopathy. Cardiovascular:   RRR. Symmetric bilateral radial and DP pulses.  No murmurs.  Respiratory:   Normal respiratory effort without tachypnea/retractions. Breath sounds are clear and equal bilaterally. No wheezes/rales/rhonchi. Gastrointestinal:   Soft with epigastric tenderness.. Non distended. There is no CVA tenderness.  No rebound, rigidity, or guarding. Genitourinary:   deferred Musculoskeletal:   Normal range of motion in all extremities. No joint effusions.  No lower extremity tenderness.  No edema. Chest wall  nontender Neurologic:   Normal speech and language.  Motor grossly intact. No gross focal neurologic deficits are appreciated.  Skin:    Skin is warm, dry and intact. No rash noted.  No petechiae, purpura, or bullae.  ____________________________________________    LABS (pertinent positives/negatives) (all labs ordered are listed, but only abnormal results are displayed) Labs Reviewed  BASIC METABOLIC PANEL - Abnormal; Notable for the following:       Result Value   Potassium 3.3 (*)    Chloride 99 (*)    Glucose, Bld 115 (*)    Creatinine, Ser 1.24 (*)    Calcium 7.0 (*)    GFR calc non Af Amer 48 (*)    GFR calc Af Amer 56 (*)    All other components within normal limits  CBC - Abnormal; Notable for the following:    RBC 5.41 (*)    All other components within normal limits  HEPATIC FUNCTION PANEL - Abnormal; Notable for the following:    AST 259 (*)    ALT 193 (*)    Total Bilirubin 2.3 (*)    Bilirubin, Direct 0.8 (*)    Indirect Bilirubin 1.5 (*)    All other components within normal limits  TROPONIN I  LIPASE, BLOOD   ____________________________________________   EKG  Interpreted by me Sinus rhythm rate of 94, normal axis and intervals. Normal QRS and ST segments. Diffuse T-wave inversions in inferior and anterolateral leads. Unchanged from previous  ____________________________________________    RADIOLOGY  Dg Chest 2 View  Result Date: 07/11/2016 CLINICAL DATA:  Mid chest pain EXAM: CHEST  2 VIEW COMPARISON:  07/09/2016 FINDINGS: Chronic left rib and clavicular deformities, stable, from old healed fractures. Thoracic spondylosis. Bandlike densities primarily in the posterior basal segment left lower lobe compatible with atelectasis or scarring. The lungs appear otherwise clear. Cardiac and mediastinal margins appear normal. IMPRESSION: 1. Subsegmental atelectasis or scarring in the left lower lobe. 2. Thoracic spondylosis. 3. Old left rib deformities and old  left clavicular for deformity. Electronically Signed   By: Van Clines M.D.   On: 07/11/2016 16:10    ____________________________________________   PROCEDURES Procedures  ____________________________________________   INITIAL IMPRESSION / ASSESSMENT AND PLAN / ED COURSE  Pertinent labs & imaging results that were available during my care of the patient were reviewed  by me and considered in my medical decision making (see chart for details).  Patient presents with atypical chest pain radiating to the epigastrium. Possibly due to GERD and gastritis related to alcohol abuse.Considering the patient's symptoms, medical history, and physical examination today, I have low suspicion for ACS, PE, TAD, pneumothorax, carditis, mediastinitis, pneumonia, CHF, or sepsis.  Because of clinical uncertainty and her repeat presentation failure to improve with acid suppression medication, we'll obtain a CT scan of the chest abdomen pelvis today for further evaluation. LFTs are elevated, raising suspicion of biliary disease. If CT is negative, would proceed with ultrasound right upper quadrant. If workup is unremarkable, including patient is suitable for outpatient follow-up as her presentation is not consistent with ACS, she is hemodynamically stable without evidence of sepsis or other acute infection.Marland Kitchen   ----------------------------------------- 8:48 PM on 07/11/2016 -----------------------------------------  Case to Dr. Mable Paris to follow up on CT and possibly ultrasound. On reassessment patient is still tender in the right upper quadrant and epigastrium. Counseled patient to avoid alcohol as this is likely exacerbating her symptoms with gastritis and hepatitis. She has follow-up with GI this month and can undergo further medical workup if imaging is unremarkable. She has Reglan and Pepcid and Carafate already.     ____________________________________________   FINAL CLINICAL IMPRESSION(S) /  ED DIAGNOSES  Final diagnoses:  Atypical chest pain  Elevated LFTs      New Prescriptions   No medications on file     Portions of this note were generated with dragon dictation software. Dictation errors may occur despite best attempts at proofreading.    Modest Mew, MD 07/11/16 2049

## 2016-07-20 ENCOUNTER — Other Ambulatory Visit: Payer: Self-pay | Admitting: Gastroenterology

## 2016-07-20 DIAGNOSIS — R748 Abnormal levels of other serum enzymes: Secondary | ICD-10-CM

## 2016-07-26 ENCOUNTER — Ambulatory Visit
Admission: RE | Admit: 2016-07-26 | Discharge: 2016-07-26 | Disposition: A | Discharge status: Home / Self Care | Payer: Medicaid Other | Source: Ambulatory Visit | Attending: Gastroenterology | Admitting: Gastroenterology

## 2016-07-26 DIAGNOSIS — K838 Other specified diseases of biliary tract: Secondary | ICD-10-CM | POA: Insufficient documentation

## 2016-07-26 DIAGNOSIS — R748 Abnormal levels of other serum enzymes: Secondary | ICD-10-CM | POA: Diagnosis present

## 2016-07-26 DIAGNOSIS — K760 Fatty (change of) liver, not elsewhere classified: Secondary | ICD-10-CM | POA: Diagnosis not present

## 2016-07-27 ENCOUNTER — Other Ambulatory Visit: Payer: Self-pay | Admitting: Internal Medicine

## 2016-07-27 DIAGNOSIS — R0602 Shortness of breath: Secondary | ICD-10-CM

## 2016-07-27 DIAGNOSIS — I209 Angina pectoris, unspecified: Secondary | ICD-10-CM

## 2016-07-27 DIAGNOSIS — I208 Other forms of angina pectoris: Secondary | ICD-10-CM

## 2016-07-27 DIAGNOSIS — I2089 Other forms of angina pectoris: Secondary | ICD-10-CM

## 2016-08-03 ENCOUNTER — Ambulatory Visit
Admission: RE | Admit: 2016-08-03 | Discharge: 2016-08-03 | Disposition: A | Discharge status: Home / Self Care | Payer: Medicaid Other | Source: Ambulatory Visit | Attending: Internal Medicine | Admitting: Internal Medicine

## 2016-08-03 DIAGNOSIS — I208 Other forms of angina pectoris: Secondary | ICD-10-CM

## 2016-08-03 DIAGNOSIS — I051 Rheumatic mitral insufficiency: Secondary | ICD-10-CM | POA: Insufficient documentation

## 2016-08-03 DIAGNOSIS — R0602 Shortness of breath: Secondary | ICD-10-CM

## 2016-08-03 LAB — NM MYOCAR MULTI W/SPECT W/WALL MOTION / EF
CHL CUP MPHR: 165 {beats}/min
CHL CUP NUCLEAR SSS: 3
CHL CUP STRESS STAGE 1 GRADE: 0 %
CHL CUP STRESS STAGE 2 HR: 72 {beats}/min
CHL CUP STRESS STAGE 3 GRADE: 0 %
CHL CUP STRESS STAGE 3 HR: 98 {beats}/min
CHL CUP STRESS STAGE 4 SPEED: 0 mph
CSEPED: 1 min
CSEPEW: 1 METS
Exercise duration (sec): 1 s
LV dias vol: 79 mL (ref 46–106)
LVSYSVOL: 40 mL
Peak HR: 98 {beats}/min
Percent HR: 63 %
Percent of predicted max HR: 59 %
Rest HR: 70 {beats}/min
SDS: 0
SRS: 1
Stage 1 HR: 73 {beats}/min
Stage 1 Speed: 0 mph
Stage 2 Grade: 0 %
Stage 2 Speed: 0 mph
Stage 3 Speed: 0 mph
Stage 4 DBP: 46 mmHg
Stage 4 Grade: 0 %
Stage 4 HR: 99 {beats}/min
Stage 4 SBP: 108 mmHg
TID: 0.83

## 2016-08-03 MED ORDER — TECHNETIUM TC 99M TETROFOSMIN IV KIT
12.6310 | PACK | Freq: Once | INTRAVENOUS | Status: AC | PRN
  Administered 2016-08-03: 12.631 via INTRAVENOUS

## 2016-08-03 MED ORDER — TECHNETIUM TC 99M TETROFOSMIN IV KIT
32.1200 | PACK | Freq: Once | INTRAVENOUS | Status: AC | PRN
  Administered 2016-08-03: 32.12 via INTRAVENOUS

## 2016-08-03 MED ORDER — REGADENOSON 0.4 MG/5ML IV SOLN
0.4000 mg | Freq: Once | INTRAVENOUS | Status: AC
  Administered 2016-08-03: 0.4 mg via INTRAVENOUS

## 2016-08-03 NOTE — Progress Notes (Signed)
*  PRELIMINARY RESULTS* Echocardiogram 2D Echocardiogram has been performed.  Sherrie Sport 08/03/2016, 10:03 AM

## 2016-08-12 ENCOUNTER — Other Ambulatory Visit: Payer: Self-pay | Admitting: Internal Medicine

## 2016-08-13 ENCOUNTER — Other Ambulatory Visit: Payer: Self-pay | Admitting: Internal Medicine

## 2016-08-13 DIAGNOSIS — Z1239 Encounter for other screening for malignant neoplasm of breast: Secondary | ICD-10-CM

## 2016-09-23 ENCOUNTER — Ambulatory Visit
Admission: RE | Admit: 2016-09-23 | Discharge: 2016-09-23 | Disposition: A | Discharge status: Home / Self Care | Payer: Medicaid Other | Source: Ambulatory Visit | Attending: Internal Medicine | Admitting: Internal Medicine

## 2016-09-23 DIAGNOSIS — Z1231 Encounter for screening mammogram for malignant neoplasm of breast: Secondary | ICD-10-CM | POA: Diagnosis not present

## 2016-09-23 DIAGNOSIS — Z1239 Encounter for other screening for malignant neoplasm of breast: Secondary | ICD-10-CM

## 2016-10-26 ENCOUNTER — Other Ambulatory Visit: Payer: Self-pay | Admitting: Internal Medicine

## 2016-10-26 DIAGNOSIS — J42 Unspecified chronic bronchitis: Secondary | ICD-10-CM

## 2016-11-09 ENCOUNTER — Ambulatory Visit: Payer: Medicaid Other | Attending: Internal Medicine

## 2016-11-09 DIAGNOSIS — J42 Unspecified chronic bronchitis: Secondary | ICD-10-CM | POA: Diagnosis present

## 2016-11-09 DIAGNOSIS — M1711 Unilateral primary osteoarthritis, right knee: Secondary | ICD-10-CM

## 2017-02-09 ENCOUNTER — Other Ambulatory Visit: Payer: Self-pay | Admitting: Gastroenterology

## 2017-02-09 DIAGNOSIS — K760 Fatty (change of) liver, not elsewhere classified: Secondary | ICD-10-CM

## 2017-02-09 DIAGNOSIS — K7400 Hepatic fibrosis, unspecified: Secondary | ICD-10-CM

## 2017-02-09 DIAGNOSIS — R7989 Other specified abnormal findings of blood chemistry: Secondary | ICD-10-CM

## 2017-02-09 DIAGNOSIS — R945 Abnormal results of liver function studies: Principal | ICD-10-CM

## 2017-02-23 ENCOUNTER — Ambulatory Visit
Admission: RE | Admit: 2017-02-23 | Discharge: 2017-02-23 | Disposition: A | Discharge status: Home / Self Care | Payer: Medicaid Other | Source: Ambulatory Visit | Attending: Gastroenterology | Admitting: Gastroenterology

## 2017-02-23 DIAGNOSIS — K7400 Hepatic fibrosis, unspecified: Secondary | ICD-10-CM

## 2017-02-23 DIAGNOSIS — K760 Fatty (change of) liver, not elsewhere classified: Secondary | ICD-10-CM | POA: Insufficient documentation

## 2017-02-23 DIAGNOSIS — R945 Abnormal results of liver function studies: Secondary | ICD-10-CM | POA: Diagnosis present

## 2017-02-23 DIAGNOSIS — R7989 Other specified abnormal findings of blood chemistry: Secondary | ICD-10-CM

## 2017-03-21 ENCOUNTER — Other Ambulatory Visit: Payer: Self-pay | Admitting: Internal Medicine

## 2017-03-21 DIAGNOSIS — J449 Chronic obstructive pulmonary disease, unspecified: Secondary | ICD-10-CM

## 2017-04-07 ENCOUNTER — Ambulatory Visit: Payer: Medicaid Other | Attending: Internal Medicine

## 2017-04-07 DIAGNOSIS — J449 Chronic obstructive pulmonary disease, unspecified: Secondary | ICD-10-CM

## 2017-08-28 ENCOUNTER — Encounter: Payer: Self-pay | Admitting: Emergency Medicine

## 2017-08-28 ENCOUNTER — Emergency Department: Payer: Medicaid Other

## 2017-08-28 ENCOUNTER — Other Ambulatory Visit: Payer: Self-pay

## 2017-08-28 ENCOUNTER — Emergency Department
Admission: EM | Admit: 2017-08-28 | Discharge: 2017-08-28 | Disposition: A | Discharge status: Home / Self Care | Payer: Medicaid Other | Source: Home / Self Care | Attending: Emergency Medicine | Admitting: Emergency Medicine

## 2017-08-28 DIAGNOSIS — Z87891 Personal history of nicotine dependence: Secondary | ICD-10-CM

## 2017-08-28 DIAGNOSIS — Z79899 Other long term (current) drug therapy: Secondary | ICD-10-CM | POA: Insufficient documentation

## 2017-08-28 DIAGNOSIS — J449 Chronic obstructive pulmonary disease, unspecified: Secondary | ICD-10-CM | POA: Insufficient documentation

## 2017-08-28 DIAGNOSIS — Z96652 Presence of left artificial knee joint: Secondary | ICD-10-CM | POA: Insufficient documentation

## 2017-08-28 DIAGNOSIS — Z7902 Long term (current) use of antithrombotics/antiplatelets: Secondary | ICD-10-CM

## 2017-08-28 DIAGNOSIS — R109 Unspecified abdominal pain: Secondary | ICD-10-CM

## 2017-08-28 DIAGNOSIS — I10 Essential (primary) hypertension: Secondary | ICD-10-CM | POA: Insufficient documentation

## 2017-08-28 DIAGNOSIS — R112 Nausea with vomiting, unspecified: Secondary | ICD-10-CM | POA: Insufficient documentation

## 2017-08-28 DIAGNOSIS — R197 Diarrhea, unspecified: Secondary | ICD-10-CM

## 2017-08-28 DIAGNOSIS — Z96651 Presence of right artificial knee joint: Secondary | ICD-10-CM

## 2017-08-28 DIAGNOSIS — E119 Type 2 diabetes mellitus without complications: Secondary | ICD-10-CM

## 2017-08-28 DIAGNOSIS — N390 Urinary tract infection, site not specified: Secondary | ICD-10-CM | POA: Insufficient documentation

## 2017-08-28 DIAGNOSIS — Z7984 Long term (current) use of oral hypoglycemic drugs: Secondary | ICD-10-CM

## 2017-08-28 LAB — COMPREHENSIVE METABOLIC PANEL
ALT: 35 U/L (ref 0–44)
ANION GAP: 12 (ref 5–15)
AST: 83 U/L — ABNORMAL HIGH (ref 15–41)
Albumin: 3.8 g/dL (ref 3.5–5.0)
Alkaline Phosphatase: 131 U/L — ABNORMAL HIGH (ref 38–126)
BUN: 10 mg/dL (ref 6–20)
CO2: 26 mmol/L (ref 22–32)
Calcium: 7.3 mg/dL — ABNORMAL LOW (ref 8.9–10.3)
Chloride: 98 mmol/L (ref 98–111)
Creatinine, Ser: 0.61 mg/dL (ref 0.44–1.00)
GFR calc non Af Amer: >60 mL/min (ref >60)
Glucose, Bld: 143 mg/dL — ABNORMAL HIGH (ref 70–99)
Potassium: 3.1 mmol/L — ABNORMAL LOW (ref 3.5–5.1)
SODIUM: 136 mmol/L (ref 135–145)
Total Bilirubin: 1.8 mg/dL — ABNORMAL HIGH (ref 0.3–1.2)
Total Protein: 7.7 g/dL (ref 6.5–8.1)

## 2017-08-28 LAB — CBC
HCT: 37.8 % (ref 35.0–47.0)
HEMOGLOBIN: 12.5 g/dL (ref 12.0–16.0)
MCH: 27.4 pg (ref 26.0–34.0)
MCHC: 33.2 g/dL (ref 32.0–36.0)
MCV: 82.5 fL (ref 80.0–100.0)
Platelets: 393 10*3/uL (ref 150–440)
RBC: 4.58 MIL/uL (ref 3.80–5.20)
RDW: 15.2 % — ABNORMAL HIGH (ref 11.5–14.5)
WBC: 15.9 10*3/uL — AB (ref 3.6–11.0)

## 2017-08-28 LAB — URINALYSIS, COMPLETE (UACMP) WITH MICROSCOPIC
BILIRUBIN URINE: NEGATIVE
Bacteria, UA: NONE SEEN
Glucose, UA: NEGATIVE mg/dL
HGB URINE DIPSTICK: NEGATIVE
KETONES UR: 5 mg/dL — AB
Nitrite: NEGATIVE
PH: 7 (ref 5.0–8.0)
Protein, ur: NEGATIVE mg/dL
Specific Gravity, Urine: 1.02 (ref 1.005–1.030)

## 2017-08-28 LAB — LACTIC ACID, PLASMA: Lactic Acid, Venous: 1.4 mmol/L (ref 0.5–1.9)

## 2017-08-28 LAB — LIPASE, BLOOD: Lipase: 48 U/L (ref 11–51)

## 2017-08-28 MED ORDER — POTASSIUM CHLORIDE CRYS ER 20 MEQ PO TBCR
40.0000 meq | EXTENDED_RELEASE_TABLET | Freq: Once | ORAL | Status: AC
  Administered 2017-08-28: 40 meq via ORAL
  Filled 2017-08-28: qty 2

## 2017-08-28 MED ORDER — ONDANSETRON HCL 4 MG PO TABS: 4.0000 mg | ORAL_TABLET | Freq: Three times a day (TID) | ORAL | 0 refills | Status: DC | PRN

## 2017-08-28 MED ORDER — NITROFURANTOIN MONOHYD MACRO 100 MG PO CAPS: 100.0000 mg | ORAL_CAPSULE | Freq: Two times a day (BID) | ORAL | 0 refills | Status: AC

## 2017-08-28 MED ORDER — ONDANSETRON HCL 4 MG/2ML IJ SOLN
4.0000 mg | Freq: Once | INTRAMUSCULAR | Status: AC
  Administered 2017-08-28: 4 mg via INTRAVENOUS
  Filled 2017-08-28: qty 2

## 2017-08-28 MED ORDER — SODIUM CHLORIDE 0.9 % IV BOLUS
1000.0000 mL | Freq: Once | INTRAVENOUS | Status: AC
  Administered 2017-08-28: 1000 mL via INTRAVENOUS

## 2017-08-28 MED ORDER — FENTANYL CITRATE (PF) 100 MCG/2ML IJ SOLN
50.0000 ug | Freq: Once | INTRAMUSCULAR | Status: AC
  Administered 2017-08-28: 50 ug via INTRAVENOUS
  Filled 2017-08-28: qty 2

## 2017-08-28 MED ORDER — IOPAMIDOL (ISOVUE-300) INJECTION 61%
100.0000 mL | Freq: Once | INTRAVENOUS | Status: AC | PRN
  Administered 2017-08-28: 100 mL via INTRAVENOUS

## 2017-08-28 NOTE — ED Triage Notes (Signed)
Pt presents with upper and lower abdominal pain x 1 week; reports that she has not been able to keep down any food or drinks since last Sunday. Pt alert & oriented, nad noted.

## 2017-08-28 NOTE — ED Notes (Signed)
Gwen in lab notified to add  Urine culture

## 2017-08-28 NOTE — ED Triage Notes (Signed)
First RN: Pt presents to ED via POV with family with c/o upper abd pain and vomiting. Pt's family reports that last time patient ate was 1 week ago today.

## 2017-08-28 NOTE — ED Notes (Signed)
Returned from CT.

## 2017-08-28 NOTE — ED Provider Notes (Addendum)
[facility] Emergency Department Provider Note  ____________________________________________   I have reviewed the triage vital signs and the nursing notes. Where available I have reviewed prior notes and, if possible and indicated, outside hospital notes.    HISTORY  Chief Complaint Abdominal Pain    HPI Sheila [name] is a 57 y.o. female  Who presents today complaining of abdominal pain and vomiting and diarrhea.  [name] had this before but not recently.  She states that she has had no melena no bright red blood per rectum but she is been unable to tolerate p.o. for the last several days and has had multiple different episodes of loose stool.  Diffuse abdominal cramping pain.  No fever no chills.  No hematemesis.  Nothing makes the pain worse except for vomiting nothing makes it better is every worse pain is crampy.    Past Medical History:  Diagnosis Date  . Anemia   . Arthritis   . Asthma   . COPD (chronic obstructive pulmonary disease) ([place])   . DDD (degenerative disc disease), lumbar   . Diabetes mellitus without complication ([place])    on metformin  . DJD (degenerative joint disease)   . Dysrhythmia   . Fatty liver   . Fibroids   . GERD (gastroesophageal reflux disease)   . Glaucoma   . Hyperlipidemia   . Hypertension   . IBS (irritable bowel syndrome)   . Lumbago   . Neuromuscular disorder ([place])   . 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[place]) 46/6599  . Renal disorder   . Ulcerative colitis ([place])    on mesalamine    Patient Active Problem List   Diagnosis Date Noted  . Primary osteoarthritis of right knee 01/14/2015    Past Surgical History:  Procedure Laterality Date  . COLONOSCOPY WITH PROPOFOL N/A 08/19/2015   Procedure: COLONOSCOPY WITH PROPOFOL;  Surgeon: Lollie Sails, MD;  Location: [facility] ENDOSCOPY;  Service: Endoscopy;  Laterality: N/A;  . COLONOSCOPY WITH PROPOFOL N/A 12/08/2015   Procedure: COLONOSCOPY WITH PROPOFOL;  Surgeon: Lollie Sails, MD;  Location: [facility] ENDOSCOPY;  Service: Endoscopy;  Laterality: N/A;  . COLONOSCOPY WITH PROPOFOL N/A 12/09/2015   Procedure: COLONOSCOPY WITH PROPOFOL;  Surgeon: Lollie Sails, MD;  Location: [facility] ENDOSCOPY;  Service: Endoscopy;  Laterality: N/A;  . JOINT REPLACEMENT Left 04/19/2006  . JOINT REPLACEMENT Right 01/14/2015  . TOTAL KNEE ARTHROPLASTY Left 2008  . TOTAL KNEE ARTHROPLASTY Right 01/14/2015   Procedure: TOTAL KNEE ARTHROPLASTY;  Surgeon: Hessie Knows, MD;  Location: [facility] ORS;  Service: Orthopedics;  Laterality: Right;    Prior to Admission medications   Medication Sig Start Date End Date Taking? Authorizing Provider  albuterol (PROVENTIL HFA;VENTOLIN HFA) 108 (90 BASE) MCG/ACT inhaler Inhale 2 puffs into the lungs every 4 (four) hours as needed for wheezing or shortness of breath.    [provider]  brinzolamide (AZOPT) 1 % ophthalmic suspension Place 1 drop into both eyes 3 (three) times daily.    [provider]  chlorthalidone (HYGROTON) 25 MG tablet Take 25 mg by mouth daily.  01/04/15   [provider]  colchicine 0.6 MG tablet Take 0.6 mg by mouth daily.    [provider]  diazepam (VALIUM) 5 MG tablet Take 1 tablet (5 mg total) by mouth every 6 (six) hours as needed for anxiety. Patient not taking: Reported on 07/09/2016 01/17/15   Duanne Guess, PA-C  enoxaparin (LOVENOX) 40 MG/0.4ML injection Inject 0.4 mLs (40 mg total) into  the skin daily. Patient not taking: Reported on 07/09/2016 01/17/15   Duanne Guess, PA-C  famotidine (PEPCID) 20 MG tablet Take 1 tablet (20 mg total) by mouth 2 (two) times daily. 07/09/16   Hanin Mew, MD  ibuprofen (ADVIL,MOTRIN) 800 MG tablet Take 800 mg by mouth every 8 (eight) hours as needed for mild pain.     [provider]  KLOR-CON M20 20 MEQ tablet Take 2 tablets by mouth 2 (two) times daily. 01/01/15   [provider]  latanoprost (XALATAN) 0.005 % ophthalmic  solution Place 1 drop into both eyes at bedtime. 10/01/14   [provider]  losartan (COZAAR) 25 MG tablet Take 25 mg by mouth daily.     [provider]  mesalamine (APRISO) 0.375 G 24 hr capsule Take 375 mg by mouth daily.     [provider]  metFORMIN (GLUCOPHAGE-XR) 500 MG 24 hr tablet Take 500 mg by mouth 2 (two) times daily.     [provider]  metoCLOPramide (REGLAN) 10 MG tablet Take 1 tablet (10 mg total) by mouth every 6 (six) hours as needed. 07/09/16   Nawal Mew, MD  metoprolol succinate (TOPROL-XL) 25 MG 24 hr tablet Take 1 tablet (25 mg total) by mouth daily. 01/17/15   Duanne Guess, PA-C  MUCINEX 600 MG 12 hr tablet Take 1 tablet by mouth 2 (two) times daily as needed for cough.  01/03/15   [provider]  oxyCODONE (OXY IR/ROXICODONE) 5 MG immediate release tablet Take 1-2 tablets (5-10 mg total) by mouth every 3 (three) hours as needed for moderate pain. Patient not taking: Reported on 07/09/2016 01/17/15   Duanne Guess, PA-C  pantoprazole (PROTONIX) 40 MG tablet Take 40 mg by mouth every morning.    [provider]  pravastatin (PRAVACHOL) 10 MG tablet Take 10 mg by mouth at bedtime.    [provider]  sucralfate (CARAFATE) 1 g tablet Take 1 tablet (1 g total) by mouth 4 (four) times daily. 07/09/16   Mileydi Mew, MD    Allergies Ampicillin; Erythromycin; Omeprazole; Penicillins; Quinapril; and Triamterene-hctz  Family History  Problem Relation Age of Onset  . Breast cancer Neg Hx     Social History Social History   Tobacco Use  . Smoking status: Former Smoker    Last attempt to quit: 01/04/1978    Years since quitting: 39.6  . Smokeless tobacco: Never Used  Substance Use Topics  . Alcohol use: Yes    Comment: Recovering alcoholic since 49/4496  . Drug use: No    Comment: used cocaine, quit in 2003    Review of Systems Constitutional: No fever/chills Eyes: No visual changes. ENT:  No sore throat. No stiff neck no neck pain Cardiovascular: Denies chest pain. Respiratory: Denies shortness of breath. Gastrointestinal:   no vomiting.  No diarrhea.  No constipation. Genitourinary: Negative for dysuria. Musculoskeletal: Negative lower extremity swelling Skin: Negative for rash. Neurological: Negative for severe headaches, focal weakness or numbness.   ____________________________________________   PHYSICAL EXAM:  VITAL SIGNS: ED Triage Vitals  Enc Vitals Group     BP 08/28/17 1043 105/67     Pulse Rate 08/28/17 1043 79     Resp 08/28/17 1043 18     Temp 08/28/17 1043 97.8 F (36.6 C)     Temp Source 08/28/17 1043 Oral     SpO2 08/28/17 1043 99 %     Weight 08/28/17 1044 155 lb (70.3 kg)  Height 08/28/17 1044 5' (1.524 m)     Head Circumference --      Peak Flow --      Pain Score 08/28/17 1044 10     Pain Loc --      Pain Edu? --      Excl. in [place]? --     Constitutional: Alert and oriented. Well appearing and in no acute distress. Eyes: Conjunctivae are normal Head: Atraumatic HEENT: No congestion/rhinnorhea. Mucous membranes are moist.  Oropharynx non-erythematous Neck:   Nontender with no meningismus, no masses, no stridor Cardiovascular: Normal rate, regular rhythm. Grossly normal heart sounds.  Good peripheral circulation. Respiratory: Normal respiratory effort.  No retractions. Lungs CTAB. Abdominal: Soft and diffusely tender. No distention. No guarding no rebound Back:  There is no focal tenderness or step off.  there is no midline tenderness there are no lesions noted. there is no CVA tenderness Musculoskeletal: No lower extremity tenderness, no upper extremity tenderness. No joint effusions, no DVT signs strong distal pulses no edema Neurologic:  Normal speech and language. No gross focal neurologic deficits are appreciated.  Skin:  Skin is warm, dry and intact. No rash noted. Psychiatric: Mood and affect are normal. Speech and behavior are  normal.  ____________________________________________   LABS (all labs ordered are listed, but only abnormal results are displayed)  Labs Reviewed  COMPREHENSIVE METABOLIC PANEL - Abnormal; Notable for the following components:      Result Value   Potassium 3.1 (*)    Glucose, Bld 143 (*)    Calcium 7.3 (*)    AST 83 (*)    Alkaline Phosphatase 131 (*)    Total Bilirubin 1.8 (*)    All other components within normal limits  CBC - Abnormal; Notable for the following components:   WBC 15.9 (*)    RDW 15.2 (*)    All other components within normal limits  URINALYSIS, COMPLETE (UACMP) WITH MICROSCOPIC - Abnormal; Notable for the following components:   Color, Urine AMBER (*)    APPearance CLEAR (*)    Ketones, ur 5 (*)    Leukocytes, UA LARGE (*)    All other components within normal limits  LIPASE, BLOOD  LACTIC ACID, PLASMA  LACTIC ACID, PLASMA    Pertinent labs  results that were available during my care of the patient were reviewed by me and considered in my medical decision making (see chart for details). ____________________________________________  EKG  I personally interpreted any EKGs ordered by me or triage  ____________________________________________  RADIOLOGY  Pertinent labs & imaging results that were available during my care of the patient were reviewed by me and considered in my medical decision making (see chart for details). If possible, patient and/or family made aware of any abnormal findings.  Ct Abdomen Pelvis W Contrast  Result Date: 08/28/2017 CLINICAL DATA:  Abdominal pain x1 week, vomiting EXAM: CT ABDOMEN AND PELVIS WITH CONTRAST TECHNIQUE: Multidetector CT imaging of the abdomen and pelvis was performed using the standard protocol following bolus administration of intravenous contrast. CONTRAST:  114m ISOVUE-300 IOPAMIDOL (ISOVUE-300) INJECTION 61% COMPARISON:  07/11/2016 FINDINGS: Lower chest: Mild dependent atelectasis at the lung bases.  Hepatobiliary: Suspected geographic hepatic steatosis. Gallbladder is unremarkable. No intrahepatic or extrahepatic ductal dilatation. Pancreas: Within normal limits. Spleen: Within normal limits. Adrenals/Urinary Tract: Adrenal glands within normal limits. Kidneys are within normal limits.  No hydronephrosis. Bladder is underdistended but unremarkable. Stomach/Bowel: Stomach is within normal limits. No evidence of bowel obstruction. Normal appendix (series 2/image  52). Mild sigmoid diverticulosis, without evidence of diverticulitis. Vascular/Lymphatic: No evidence of abdominal aortic aneurysm. Small upper abdominal lymph nodes, including a 14 mm short axis periportal node (series 2/image 24), previously 9 mm, likely reactive. No suspicious retroperitoneal/pelvic lymphadenopathy. Reproductive: Uterine fibroids, including a dominant 4.5 cm subserosal posterior fundal fibroid (series 2/image 64), grossly unchanged. Bilateral ovaries are grossly unremarkable. Bilateral tubal ligation clips. Other: No abdominopelvic ascites. Musculoskeletal: Degenerative changes the visualized thoracolumbar spine. IMPRESSION: No evidence of bowel obstruction. Normal appendix. Mild sigmoid diverticulosis, without evidence of diverticulitis. Geographic hepatic steatosis. Small upper abdominal lymph nodes, likely reactive. Uterine fibroids. Electronically Signed   By: Julian Hy M.D.   On: 08/28/2017 13:23   ____________________________________________    PROCEDURES  Procedure(s) performed: None  Procedures  Critical Care performed: None  ____________________________________________   INITIAL IMPRESSION / ASSESSMENT AND PLAN / ED COURSE  Pertinent labs & imaging results that were available during my care of the patient were reviewed by me and considered in my medical decision making (see chart for details).  Diffuse abdominal pain nausea vomiting, ages 39.  I did do a CT scan because of her diffuse abdominal  pain, it is negative.  She may have a urinary tract infection, however there is no evidence of an the intra-abdominal pathology of any significance.  We have given her IV fluid she feels much better we have given her pain medication we have given her antinausea medication, lactic is pending although very low suspicion for ischemic gut, potassium is slightly low we will replete that, we will try p.o. challenge, bilirubin and alk phos and AST are elevated but they are chronically so and have been for over a year and they are actually better than they were last time they were checked.  Patient denies EtOH abuse.  CT scan shows no evidence of gallbladder disease and she does not have any focal right upper quadrant abdominal pain.  Lipase is normal.  And again this is a chronic finding for her.  We will see if she feels better with IV fluid and we will try p.o. challenge at this time and patient does appears that she may be withdrawing from something which denies.  Denies alcohol specifically  ----------------------------------------- 2:54 PM on 08/28/2017 -----------------------------------------  Benign nontender, work-up reassuring lactic is reassuring vital signs are reassuring able to tolerate p.o., eager to go home.  Questionable UTI will treat with Macrobid given patient allergies, she will follow closely with outpatient PCP return precautions follow-up given understood    ____________________________________________   FINAL CLINICAL IMPRESSION(S) / ED DIAGNOSES  Final diagnoses:  None      This chart was dictated using voice recognition software.  Despite best efforts to proofread,  errors can occur which can change meaning.      Schuyler Amor, MD 08/28/17 1343    Schuyler Amor, MD 08/28/17 1344    Schuyler Amor, MD 08/28/17 [phone]

## 2017-08-28 NOTE — ED Notes (Signed)
Pt has had symptoms of abd pain x 1 with nausea vomited off and on all week  - yellow material  Last normal bm  1 week ago   Mucous membranes are dry   - has not been able to hold down liquids  or any thing by mouth

## 2017-08-29 ENCOUNTER — Encounter: Payer: Self-pay | Admitting: Emergency Medicine

## 2017-08-29 ENCOUNTER — Inpatient Hospital Stay
Admission: EM | Admit: 2017-08-29 | Discharge: 2017-09-01 | DRG: 439 | Disposition: A | Discharge status: Home / Self Care | Payer: Medicaid Other | Attending: Internal Medicine | Admitting: Internal Medicine

## 2017-08-29 ENCOUNTER — Other Ambulatory Visit: Payer: Self-pay

## 2017-08-29 DIAGNOSIS — Z7984 Long term (current) use of oral hypoglycemic drugs: Secondary | ICD-10-CM | POA: Diagnosis not present

## 2017-08-29 DIAGNOSIS — H409 Unspecified glaucoma: Secondary | ICD-10-CM | POA: Diagnosis present

## 2017-08-29 DIAGNOSIS — J449 Chronic obstructive pulmonary disease, unspecified: Secondary | ICD-10-CM | POA: Diagnosis present

## 2017-08-29 DIAGNOSIS — K859 Acute pancreatitis without necrosis or infection, unspecified: Secondary | ICD-10-CM

## 2017-08-29 DIAGNOSIS — E119 Type 2 diabetes mellitus without complications: Secondary | ICD-10-CM | POA: Diagnosis present

## 2017-08-29 DIAGNOSIS — K219 Gastro-esophageal reflux disease without esophagitis: Secondary | ICD-10-CM | POA: Diagnosis present

## 2017-08-29 DIAGNOSIS — Z96653 Presence of artificial knee joint, bilateral: Secondary | ICD-10-CM | POA: Diagnosis present

## 2017-08-29 DIAGNOSIS — Z881 Allergy status to other antibiotic agents status: Secondary | ICD-10-CM

## 2017-08-29 DIAGNOSIS — E785 Hyperlipidemia, unspecified: Secondary | ICD-10-CM | POA: Diagnosis present

## 2017-08-29 DIAGNOSIS — I10 Essential (primary) hypertension: Secondary | ICD-10-CM | POA: Diagnosis present

## 2017-08-29 DIAGNOSIS — R197 Diarrhea, unspecified: Secondary | ICD-10-CM

## 2017-08-29 DIAGNOSIS — Z888 Allergy status to other drugs, medicaments and biological substances status: Secondary | ICD-10-CM | POA: Diagnosis not present

## 2017-08-29 DIAGNOSIS — K852 Alcohol induced acute pancreatitis without necrosis or infection: Principal | ICD-10-CM | POA: Diagnosis present

## 2017-08-29 DIAGNOSIS — Z79899 Other long term (current) drug therapy: Secondary | ICD-10-CM | POA: Diagnosis not present

## 2017-08-29 DIAGNOSIS — R112 Nausea with vomiting, unspecified: Secondary | ICD-10-CM

## 2017-08-29 DIAGNOSIS — K5190 Ulcerative colitis, unspecified, without complications: Secondary | ICD-10-CM | POA: Diagnosis present

## 2017-08-29 DIAGNOSIS — E876 Hypokalemia: Secondary | ICD-10-CM | POA: Diagnosis present

## 2017-08-29 DIAGNOSIS — Z87891 Personal history of nicotine dependence: Secondary | ICD-10-CM

## 2017-08-29 LAB — URINALYSIS, COMPLETE (UACMP) WITH MICROSCOPIC
Glucose, UA: NEGATIVE mg/dL
HGB URINE DIPSTICK: NEGATIVE
Ketones, ur: 20 mg/dL — AB
Nitrite: NEGATIVE
PROTEIN: NEGATIVE mg/dL
SPECIFIC GRAVITY, URINE: 1.021 (ref 1.005–1.030)
pH: 5 (ref 5.0–8.0)

## 2017-08-29 LAB — CBC
HCT: 37.8 % (ref 35.0–47.0)
Hemoglobin: 12.7 g/dL (ref 12.0–16.0)
MCH: 27.5 pg (ref 26.0–34.0)
MCHC: 33.5 g/dL (ref 32.0–36.0)
MCV: 82 fL (ref 80.0–100.0)
Platelets: 393 10*3/uL (ref 150–440)
RBC: 4.61 MIL/uL (ref 3.80–5.20)
RDW: 15.3 % — ABNORMAL HIGH (ref 11.5–14.5)
WBC: 16.5 10*3/uL — AB (ref 3.6–11.0)

## 2017-08-29 LAB — URINE CULTURE

## 2017-08-29 LAB — COMPREHENSIVE METABOLIC PANEL
ALT: 36 U/L (ref 0–44)
ANION GAP: 14 (ref 5–15)
AST: 91 U/L — ABNORMAL HIGH (ref 15–41)
Albumin: 3.9 g/dL (ref 3.5–5.0)
Alkaline Phosphatase: 126 U/L (ref 38–126)
BUN: 6 mg/dL (ref 6–20)
CHLORIDE: 102 mmol/L (ref 98–111)
CO2: 23 mmol/L (ref 22–32)
Calcium: 7.3 mg/dL — ABNORMAL LOW (ref 8.9–10.3)
Creatinine, Ser: 0.62 mg/dL (ref 0.44–1.00)
Glucose, Bld: 123 mg/dL — ABNORMAL HIGH (ref 70–99)
POTASSIUM: 2.7 mmol/L — AB (ref 3.5–5.1)
Sodium: 139 mmol/L (ref 135–145)
Total Bilirubin: 1.6 mg/dL — ABNORMAL HIGH (ref 0.3–1.2)
Total Protein: 7.5 g/dL (ref 6.5–8.1)

## 2017-08-29 LAB — MAGNESIUM: Magnesium: 0.9 mg/dL — CL (ref 1.7–2.4)

## 2017-08-29 LAB — LIPASE, BLOOD: LIPASE: 63 U/L — AB (ref 11–51)

## 2017-08-29 LAB — GLUCOSE, CAPILLARY: GLUCOSE-CAPILLARY: 85 mg/dL (ref 70–99)

## 2017-08-29 MED ORDER — ONDANSETRON HCL 4 MG PO TABS
4.0000 mg | ORAL_TABLET | Freq: Four times a day (QID) | ORAL | Status: DC | PRN
  Administered 2017-08-30: 4 mg via ORAL
  Filled 2017-08-29: qty 1

## 2017-08-29 MED ORDER — LORAZEPAM 2 MG/ML IJ SOLN: 1.0000 mg | Freq: Four times a day (QID) | INTRAMUSCULAR | Status: DC | PRN

## 2017-08-29 MED ORDER — MORPHINE SULFATE (PF) 4 MG/ML IV SOLN
4.0000 mg | Freq: Once | INTRAVENOUS | Status: DC
  Administered 2017-08-29: 4 mg via INTRAVENOUS
  Filled 2017-08-29: qty 1

## 2017-08-29 MED ORDER — MAGNESIUM SULFATE 4 GM/100ML IV SOLN
4.0000 g | Freq: Once | INTRAVENOUS | Status: AC
  Administered 2017-08-30: 4 g via INTRAVENOUS
  Filled 2017-08-29: qty 100

## 2017-08-29 MED ORDER — SODIUM CHLORIDE 0.9 % IV BOLUS
1000.0000 mL | Freq: Once | INTRAVENOUS | Status: AC
  Administered 2017-08-29: 1000 mL via INTRAVENOUS

## 2017-08-29 MED ORDER — VITAMIN B-1 100 MG PO TABS
100.0000 mg | ORAL_TABLET | Freq: Every day | ORAL | Status: DC
  Administered 2017-08-30 – 2017-09-01 (×3): 100 mg via ORAL
  Filled 2017-08-29 (×3): qty 1

## 2017-08-29 MED ORDER — SODIUM CHLORIDE 0.9 % IV SOLN
INTRAVENOUS | Status: AC
  Administered 2017-08-29: via INTRAVENOUS

## 2017-08-29 MED ORDER — PANTOPRAZOLE SODIUM 40 MG PO TBEC
40.0000 mg | DELAYED_RELEASE_TABLET | ORAL | Status: DC
  Administered 2017-08-30 – 2017-09-01 (×3): 40 mg via ORAL
  Filled 2017-08-29 (×3): qty 1

## 2017-08-29 MED ORDER — POTASSIUM CHLORIDE CRYS ER 20 MEQ PO TBCR
40.0000 meq | EXTENDED_RELEASE_TABLET | Freq: Every day | ORAL | Status: DC
  Administered 2017-08-30 – 2017-09-01 (×4): 40 meq via ORAL
  Filled 2017-08-29 (×4): qty 2

## 2017-08-29 MED ORDER — POTASSIUM CHLORIDE CRYS ER 20 MEQ PO TBCR
40.0000 meq | EXTENDED_RELEASE_TABLET | Freq: Once | ORAL | Status: AC
  Administered 2017-08-29: 40 meq via ORAL
  Filled 2017-08-29: qty 2

## 2017-08-29 MED ORDER — THIAMINE HCL 100 MG/ML IJ SOLN: 100.0000 mg | Freq: Every day | INTRAMUSCULAR | Status: DC

## 2017-08-29 MED ORDER — ACETAMINOPHEN 325 MG PO TABS: 650.0000 mg | ORAL_TABLET | Freq: Four times a day (QID) | ORAL | Status: DC | PRN

## 2017-08-29 MED ORDER — FAMOTIDINE 20 MG PO TABS
20.0000 mg | ORAL_TABLET | Freq: Two times a day (BID) | ORAL | Status: DC
  Administered 2017-08-30 – 2017-09-01 (×5): 20 mg via ORAL
  Filled 2017-08-29 (×5): qty 1

## 2017-08-29 MED ORDER — ACETAMINOPHEN 650 MG RE SUPP: 650.0000 mg | Freq: Four times a day (QID) | RECTAL | Status: DC | PRN

## 2017-08-29 MED ORDER — ONDANSETRON HCL 4 MG/2ML IJ SOLN: 4.0000 mg | Freq: Four times a day (QID) | INTRAMUSCULAR | Status: DC | PRN

## 2017-08-29 MED ORDER — POTASSIUM CHLORIDE 10 MEQ/100ML IV SOLN
10.0000 meq | Freq: Once | INTRAVENOUS | Status: AC
  Administered 2017-08-29: 10 meq via INTRAVENOUS
  Filled 2017-08-29: qty 100

## 2017-08-29 MED ORDER — OXYCODONE HCL 5 MG PO TABS
5.0000 mg | ORAL_TABLET | ORAL | Status: DC | PRN
  Administered 2017-08-30 – 2017-09-01 (×7): 5 mg via ORAL
  Filled 2017-08-29 (×7): qty 1

## 2017-08-29 MED ORDER — INSULIN ASPART 100 UNIT/ML ~~LOC~~ SOLN: 0.0000 [IU] | Freq: Four times a day (QID) | SUBCUTANEOUS | Status: DC

## 2017-08-29 MED ORDER — DORZOLAMIDE HCL 2 % OP SOLN
1.0000 [drp] | Freq: Two times a day (BID) | OPHTHALMIC | Status: DC
  Administered 2017-08-29 – 2017-09-01 (×6): 1 [drp] via OPHTHALMIC
  Filled 2017-08-29: qty 10

## 2017-08-29 MED ORDER — FOLIC ACID 1 MG PO TABS
1.0000 mg | ORAL_TABLET | Freq: Every day | ORAL | Status: DC
  Administered 2017-08-30 – 2017-09-01 (×3): 1 mg via ORAL
  Filled 2017-08-29 (×3): qty 1

## 2017-08-29 MED ORDER — LATANOPROST 0.005 % OP SOLN
1.0000 [drp] | Freq: Every day | OPHTHALMIC | Status: DC
  Filled 2017-08-29: qty 2.5

## 2017-08-29 MED ORDER — ENOXAPARIN SODIUM 40 MG/0.4ML ~~LOC~~ SOLN
40.0000 mg | SUBCUTANEOUS | Status: DC
  Administered 2017-08-30 – 2017-09-01 (×2): 40 mg via SUBCUTANEOUS
  Filled 2017-08-29 (×2): qty 0.4

## 2017-08-29 MED ORDER — LORAZEPAM 2 MG PO TABS: 0.0000 mg | ORAL_TABLET | Freq: Four times a day (QID) | ORAL | Status: AC

## 2017-08-29 MED ORDER — ADULT MULTIVITAMIN W/MINERALS CH
1.0000 | ORAL_TABLET | Freq: Every day | ORAL | Status: DC
  Administered 2017-08-30 – 2017-09-01 (×3): 1 via ORAL
  Filled 2017-08-29 (×3): qty 1

## 2017-08-29 MED ORDER — LORAZEPAM 2 MG PO TABS: 0.0000 mg | ORAL_TABLET | Freq: Two times a day (BID) | ORAL | Status: DC

## 2017-08-29 MED ORDER — MORPHINE SULFATE (PF) 4 MG/ML IV SOLN: 4.0000 mg | INTRAVENOUS | Status: DC | PRN

## 2017-08-29 MED ORDER — ONDANSETRON HCL 4 MG/2ML IJ SOLN
4.0000 mg | Freq: Once | INTRAMUSCULAR | Status: AC
  Administered 2017-08-29: 4 mg via INTRAVENOUS
  Filled 2017-08-29: qty 2

## 2017-08-29 MED ORDER — LORAZEPAM 1 MG PO TABS: 1.0000 mg | ORAL_TABLET | Freq: Four times a day (QID) | ORAL | Status: DC | PRN

## 2017-08-29 MED ORDER — SODIUM CHLORIDE 0.9 % IV SOLN
1.0000 g | Freq: Once | INTRAVENOUS | Status: AC
  Administered 2017-08-29: 1 g via INTRAVENOUS
  Filled 2017-08-29: qty 10

## 2017-08-29 NOTE — ED Notes (Signed)
Pt was just seen yesterday here and states that she does not feel any better. Pt was d/c on antibiotics and nausea medication that she just started today.

## 2017-08-29 NOTE — ED Provider Notes (Signed)
[facility] Emergency Department Provider Note  ____________________________________________  Time seen: Approximately 9:48 PM  I have reviewed the triage vital signs and the nursing notes.   HISTORY  Chief Complaint Emesis   HPI Sheila [name] is a 57 y.o. female with a history of anemia, asthma, COPD, diabetes, GERD, hypertension, hyperlipidemia, IBS, ulcerative colitis who presents for evaluation of abdominal pain.  Patient reports 1 week of nausea, vomiting and diarrhea which has been getting worse over the last 3 days.  She reports 3-4 daily episodes of nonbloody nonbilious emesis and one daily episode of diarrhea.  No hematemesis, melena, coffee-ground emesis, hematochezia.  For the last 3 days she has had abdominal pain that she describes as sharp, located in the epigastric, radiating to her back, constant.  The pain is currently 8 out of 10.  No fever or chills.  She denies dysuria or hematuria, chest pain or shortness of breath, URI symptoms.  She was seen here yesterday with a normal CT scan, and UA that had some leuks and WBCs however urine culture was negative.  Patient felt better after receiving IV fluids and Zofran here but immediately when she got home she started feeling worse.  [name] complaining of pins-and-needles has been intermittent in her hands and feet since she arrived at home last night.  Past Medical History:  Diagnosis Date  . Anemia   . Arthritis   . Asthma   . COPD (chronic obstructive pulmonary disease) ([place])   . DDD (degenerative disc disease), lumbar   . Diabetes mellitus without complication ([place])    on metformin  . DJD (degenerative joint disease)   . Dysrhythmia   . Fatty liver   . Fibroids   . GERD (gastroesophageal reflux disease)   . Glaucoma   . Hyperlipidemia   . Hypertension   . IBS (irritable bowel syndrome)   . Lumbago   . Neuromuscular disorder ([place])   . 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[place]) [id number]  . Renal  disorder   . Ulcerative colitis ([place])    on mesalamine    Patient Active Problem List   Diagnosis Date Noted  . Pancreatitis 08/29/2017  . Diabetes ([place]) 08/29/2017  . HTN (hypertension) 08/29/2017  . HLD (hyperlipidemia) 08/29/2017  . GERD (gastroesophageal reflux disease) 08/29/2017  . Hypokalemia 08/29/2017  . Hypocalcemia 08/29/2017  . Primary osteoarthritis of right knee 01/14/2015    Past Surgical History:  Procedure Laterality Date  . COLONOSCOPY WITH PROPOFOL N/A 08/19/2015   Procedure: COLONOSCOPY WITH PROPOFOL;  Surgeon: Lollie Sails, MD;  Location: [facility] ENDOSCOPY;  Service: Endoscopy;  Laterality: N/A;  . COLONOSCOPY WITH PROPOFOL N/A 12/08/2015   Procedure: COLONOSCOPY WITH PROPOFOL;  Surgeon: Lollie Sails, MD;  Location: [facility] ENDOSCOPY;  Service: Endoscopy;  Laterality: N/A;  . COLONOSCOPY WITH PROPOFOL N/A 12/09/2015   Procedure: COLONOSCOPY WITH PROPOFOL;  Surgeon: Lollie Sails, MD;  Location: [facility] ENDOSCOPY;  Service: Endoscopy;  Laterality: N/A;  . JOINT REPLACEMENT Left 04/19/2006  . JOINT REPLACEMENT Right 01/14/2015  . TOTAL KNEE ARTHROPLASTY Left 2008  . TOTAL KNEE ARTHROPLASTY Right 01/14/2015   Procedure: TOTAL KNEE ARTHROPLASTY;  Surgeon: Hessie Knows, MD;  Location: [facility] ORS;  Service: Orthopedics;  Laterality: Right;    Prior to Admission medications   Medication Sig Start Date End Date Taking? Authorizing Provider  albuterol (PROVENTIL HFA;VENTOLIN HFA) 108 (90 BASE) MCG/ACT inhaler Inhale 2 puffs into the lungs every 4 (four) hours as needed for wheezing or shortness of breath.  Yes [provider]  allopurinol (ZYLOPRIM) 100 MG tablet Take 100 mg by mouth daily. 08/04/17  Yes [provider]  bimatoprost (LUMIGAN) 0.03 % ophthalmic solution Place 1 drop into both eyes at bedtime.   Yes [provider]  chlorthalidone (HYGROTON) 25 MG tablet Take 25 mg by mouth daily.  01/04/15  Yes [provider]  [facility]  VITAMIN B12 1000 MCG tablet Take 1,000 mcg by mouth daily. 07/20/17  Yes [provider]  dorzolamide (TRUSOPT) 2 % ophthalmic solution Place 1 drop into both eyes 2 (two) times daily. 07/17/17  Yes [provider]  KLOR-CON M20 20 MEQ tablet Take 40 mEq by mouth daily.  01/01/15  Yes [provider]  losartan (COZAAR) 25 MG tablet Take 25 mg by mouth daily.    Yes [provider]  mesalamine (APRISO) 0.375 G 24 hr capsule Take 1,500 mg by mouth daily.    Yes [provider]  metFORMIN (GLUCOPHAGE-XR) 500 MG 24 hr tablet Take 500 mg by mouth 2 (two) times daily.    Yes [provider]  metoprolol succinate (TOPROL-XL) 25 MG 24 hr tablet Take 1 tablet (25 mg total) by mouth daily. Patient taking differently: Take 12.5 mg by mouth daily.  01/17/15  Yes Duanne Guess, PA-C  nitrofurantoin, macrocrystal-monohydrate, (MACROBID) 100 MG capsule Take 1 capsule (100 mg total) by mouth 2 (two) times daily for 7 days. 08/28/17 09/04/17 Yes Schuyler Amor, MD  ondansetron (ZOFRAN) 4 MG tablet Take 1 tablet (4 mg total) by mouth every 8 (eight) hours as needed for nausea or vomiting. 08/28/17  Yes Schuyler Amor, MD  pantoprazole (PROTONIX) 40 MG tablet Take 40 mg by mouth every morning.   Yes [provider]  pravastatin (PRAVACHOL) 10 MG tablet Take 10 mg by mouth at bedtime.   Yes [provider]    Allergies Ampicillin; Erythromycin; Omeprazole; Penicillins; Quinapril; and Triamterene-hctz  Family History  Problem Relation Age of Onset  . Hypertension Mother   . Diabetes Father   . Hypertension Father   . Diabetes Brother   . Hypertension Brother   . Breast cancer Neg Hx     Social History Social History   Tobacco Use  . Smoking status: Former Smoker    Last attempt to quit: 01/04/1978    Years since quitting: 39.6  . Smokeless tobacco: Never Used  Substance Use Topics  . Alcohol use: Yes    Comment: Recovering  alcoholic since 19/1660  . Drug use: No    Comment: used cocaine, quit in 2003    Review of Systems  Constitutional: Negative for fever. Eyes: Negative for visual changes. ENT: Negative for sore throat. Neck: No neck pain  Cardiovascular: Negative for chest pain. Respiratory: Negative for shortness of breath. Gastrointestinal: + abdominal pain, vomiting and diarrhea. Genitourinary: Negative for dysuria. Musculoskeletal: Negative for back pain. Skin: Negative for rash. Neurological: Negative for headaches, weakness or numbness. Psych: No SI or HI  ____________________________________________   PHYSICAL EXAM:  VITAL SIGNS: ED Triage Vitals [08/29/17 1908]  Enc Vitals Group     BP 114/67     Pulse Rate 69     Resp 18     Temp (!) 97.5 F (36.4 C)     Temp src      SpO2 98 %     Weight 145 lb (65.8 kg)     Height 5' (1.524 m)     Head Circumference  Peak Flow      Pain Score 0     Pain Loc      Pain Edu?      Excl. in [place]?     Constitutional: Alert and oriented. Well appearing and in no apparent distress. HEENT:      Head: Normocephalic and atraumatic.         Eyes: Conjunctivae are normal. Sclera is non-icteric.       Mouth/Throat: Mucous membranes are moist.       Neck: Supple with no signs of meningismus. Cardiovascular: Regular rate and rhythm. No murmurs, gallops, or rubs. 2+ symmetrical distal pulses are present in all extremities. No JVD. Respiratory: Normal respiratory effort. Lungs are clear to auscultation bilaterally. No wheezes, crackles, or rhonchi.  Gastrointestinal: Soft, patient is tender to palpation the epigastric, and non distended with positive bowel sounds. No rebound or guarding. Musculoskeletal: Nontender with normal range of motion in all extremities. No edema, cyanosis, or erythema of extremities. Neurologic: Normal speech and language. Face is symmetric. Moving all extremities. No gross focal neurologic deficits are appreciated. Skin:  Skin is warm, dry and intact. No rash noted. Psychiatric: Mood and affect are normal. Speech and behavior are normal.  ____________________________________________   LABS (all labs ordered are listed, but only abnormal results are displayed)  Labs Reviewed  LIPASE, BLOOD - Abnormal; Notable for the following components:      Result Value   Lipase 63 (*)    All other components within normal limits  COMPREHENSIVE METABOLIC PANEL - Abnormal; Notable for the following components:   Potassium 2.7 (*)    Glucose, Bld 123 (*)    Calcium 7.3 (*)    AST 91 (*)    Total Bilirubin 1.6 (*)    All other components within normal limits  CBC - Abnormal; Notable for the following components:   WBC 16.5 (*)    RDW 15.3 (*)    All other components within normal limits  URINALYSIS, COMPLETE (UACMP) WITH MICROSCOPIC - Abnormal; Notable for the following components:   Color, Urine AMBER (*)    APPearance CLEAR (*)    Bilirubin Urine SMALL (*)    Ketones, ur 20 (*)    Leukocytes, UA LARGE (*)    Bacteria, UA RARE (*)    All other components within normal limits  MAGNESIUM - Abnormal; Notable for the following components:   Magnesium 0.9 (*)    All other components within normal limits  URINE CULTURE  GLUCOSE, CAPILLARY  HIV ANTIBODY (ROUTINE TESTING)  CBC  COMPREHENSIVE METABOLIC PANEL   ____________________________________________  EKG  ED ECG REPORT I, Rudene Re, the attending physician, personally viewed and interpreted this ECG.  Normal sinus rhythm, rate of 64, normal intervals, normal axis, T wave inversions in inferior leads and V3.  Unchanged from EKG done yesterday ____________________________________________  RADIOLOGY  none  ____________________________________________   PROCEDURES  Procedure(s) performed: None Procedures Critical Care performed:  None ____________________________________________   INITIAL IMPRESSION / ASSESSMENT AND PLAN / ED  COURSE  57 y.o. female with a history of anemia, asthma, COPD, diabetes, GERD, hypertension, hyperlipidemia, IBS, ulcerative colitis who presents for evaluation of abdominal pain.  This is patient's second visit to the emergency room for this complaint.  She had a negative CT scan yesterday.  She was sent home on Macrobid for questionable UTI however culture is negative.  [name] back today with worsening epigastric pain, continues to have vomiting and diarrhea.  Her white count is  trending up, her lipase is now elevated.  She had an ultrasound done in February of her gallbladder which showed no stones on the CT yesterday also showed no stones therefore I do not think there is indication for repeat imaging at this time.  She does have worsening hypokalemia with a K of 2.7.  No EKG changes.  Will give IV fluids, morphine, supplement her potassium and discussed with the hospitalist for admission for pain control, nausea control, and electrolyte repletion.      As part of my medical decision making, I reviewed the following data within the [place] notes reviewed and incorporated, Labs reviewed , EKG interpreted , Old chart reviewed, Radiograph reviewed , Discussed with admitting physician , Notes from prior ED visits and [place] Controlled Substance Database    Pertinent labs & imaging results that were available during my care of the patient were reviewed by me and considered in my medical decision making (see chart for details).    ____________________________________________   FINAL CLINICAL IMPRESSION(S) / ED DIAGNOSES  Final diagnoses:  Nausea vomiting and diarrhea  Acute pancreatitis, unspecified complication status, unspecified pancreatitis type  Hypokalemia  Hypocalcemia      NEW MEDICATIONS STARTED DURING THIS VISIT:  ED Discharge Orders    None       Note:  This document was prepared using Dragon voice recognition software and may include  unintentional dictation errors.    Alfred Levins, [place], MD 08/29/17 2330

## 2017-08-29 NOTE — ED Triage Notes (Signed)
Pt to triage via w/c with no distress noted; pt reports N/V; seen yesterday for same but "doesn't remember what they told her"; dx with UTI and rx antibiotics but having "numbness" to feet and stomach

## 2017-08-29 NOTE — H&P (Signed)
[facility] [place] NAME: Sheila [name]    MR#:  [id number]  DATE OF BIRTH:  1960-03-13  DATE OF ADMISSION:  08/29/2017  PRIMARY CARE PHYSICIAN: Ellamae Sia, MD   REQUESTING/REFERRING PHYSICIAN: Alfred Levins, MD  CHIEF COMPLAINT:   Chief Complaint  Patient presents with  . Emesis    HISTORY OF PRESENT ILLNESS:  Sheila [name]  is a 57 y.o. female who presents with chief complaint as above.  Patient presents with 1 week of abdominal pain, profound nausea with significant vomiting.  Patient states she has been vomiting every day.  She came to the ED yesterday and had a UA potentially suspicious for UTI, but did not have any urinary symptoms.  She returns today as her vomiting got acutely worse.  Today she has an elevated lipase, UA very similar to that of yesterday.  Patient does state that she drinks alcohol daily, typically between 3 and 5 shots of gin a day.  Hospitalist were called for admission for pancreatitis  PAST MEDICAL HISTORY:   Past Medical History:  Diagnosis Date  . Anemia   . Arthritis   . Asthma   . COPD (chronic obstructive pulmonary disease) ([place])   . DDD (degenerative disc disease), lumbar   . Diabetes mellitus without complication ([place])    on metformin  . DJD (degenerative joint disease)   . Dysrhythmia   . Fatty liver   . Fibroids   . GERD (gastroesophageal reflux disease)   . Glaucoma   . Hyperlipidemia   . Hypertension   . IBS (irritable bowel syndrome)   . Lumbago   . Neuromuscular disorder ([place])   . 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[place]) 03/5007  . Renal disorder   . Ulcerative colitis ([place])    on mesalamine     PAST SURGICAL HISTORY:   Past Surgical History:  Procedure Laterality Date  . COLONOSCOPY WITH PROPOFOL N/A 08/19/2015   Procedure: COLONOSCOPY WITH PROPOFOL;  Surgeon: Lollie Sails, MD;  Location: [facility] ENDOSCOPY;  Service: Endoscopy;  Laterality: N/A;  . COLONOSCOPY WITH PROPOFOL  N/A 12/08/2015   Procedure: COLONOSCOPY WITH PROPOFOL;  Surgeon: Lollie Sails, MD;  Location: [facility] ENDOSCOPY;  Service: Endoscopy;  Laterality: N/A;  . COLONOSCOPY WITH PROPOFOL N/A 12/09/2015   Procedure: COLONOSCOPY WITH PROPOFOL;  Surgeon: Lollie Sails, MD;  Location: [facility] ENDOSCOPY;  Service: Endoscopy;  Laterality: N/A;  . JOINT REPLACEMENT Left 04/19/2006  . JOINT REPLACEMENT Right 01/14/2015  . TOTAL KNEE ARTHROPLASTY Left 2008  . TOTAL KNEE ARTHROPLASTY Right 01/14/2015   Procedure: TOTAL KNEE ARTHROPLASTY;  Surgeon: Hessie Knows, MD;  Location: [facility] ORS;  Service: Orthopedics;  Laterality: Right;     SOCIAL HISTORY:   Social History   Tobacco Use  . Smoking status: Former Smoker    Last attempt to quit: 01/04/1978    Years since quitting: 39.6  . Smokeless tobacco: Never Used  Substance Use Topics  . Alcohol use: Yes    Comment: Recovering alcoholic since 38/1829     FAMILY HISTORY:   Family History  Problem Relation Age of Onset  . Hypertension Mother   . Diabetes Father   . Hypertension Father   . Diabetes Brother   . Hypertension Brother   . Breast cancer Neg Hx      DRUG ALLERGIES:   Allergies  Allergen Reactions  . Ampicillin Anaphylaxis  . Erythromycin Anaphylaxis  . Omeprazole Anaphylaxis  . Penicillins Anaphylaxis  . Quinapril Cough  .  Triamterene-Hctz Other (See Comments)    MEDICATIONS AT HOME:   Prior to Admission medications   Medication Sig Start Date End Date Taking? Authorizing Provider  allopurinol (ZYLOPRIM) 100 MG tablet Take 100 mg by mouth daily. 08/04/17  Yes [provider]  [employer] VITAMIN B12 1000 MCG tablet Take 1,000 mcg by mouth daily. 07/20/17  Yes [provider]  dorzolamide (TRUSOPT) 2 % ophthalmic solution Place 1 drop into both eyes 2 (two) times daily. 07/17/17  Yes [provider]  albuterol (PROVENTIL HFA;VENTOLIN HFA) 108 (90 BASE) MCG/ACT inhaler Inhale 2 puffs into the lungs every 4  (four) hours as needed for wheezing or shortness of breath.    [provider]  brinzolamide (AZOPT) 1 % ophthalmic suspension Place 1 drop into both eyes 3 (three) times daily.    [provider]  chlorthalidone (HYGROTON) 25 MG tablet Take 25 mg by mouth daily.  01/04/15   [provider]  colchicine 0.6 MG tablet Take 0.6 mg by mouth daily.    [provider]  famotidine (PEPCID) 20 MG tablet Take 1 tablet (20 mg total) by mouth 2 (two) times daily. 07/09/16   Novalyn Mew, MD  ibuprofen (ADVIL,MOTRIN) 800 MG tablet Take 800 mg by mouth every 8 (eight) hours as needed for mild pain.     [provider]  KLOR-CON M20 20 MEQ tablet Take 40 mEq by mouth daily.  01/01/15   [provider]  latanoprost (XALATAN) 0.005 % ophthalmic solution Place 1 drop into both eyes at bedtime. 10/01/14   [provider]  losartan (COZAAR) 25 MG tablet Take 25 mg by mouth daily.     [provider]  mesalamine (APRISO) 0.375 G 24 hr capsule Take 1,500 mg by mouth daily.     [provider]  metFORMIN (GLUCOPHAGE-XR) 500 MG 24 hr tablet Take 500 mg by mouth 2 (two) times daily.     [provider]  metoCLOPramide (REGLAN) 10 MG tablet Take 1 tablet (10 mg total) by mouth every 6 (six) hours as needed. 07/09/16   Chandler Mew, MD  metoprolol succinate (TOPROL-XL) 25 MG 24 hr tablet Take 1 tablet (25 mg total) by mouth daily. Patient taking differently: Take 12.5 mg by mouth daily.  01/17/15   Duanne Guess, PA-C  MUCINEX 600 MG 12 hr tablet Take 1 tablet by mouth 2 (two) times daily as needed for cough.  01/03/15   [provider]  nitrofurantoin, macrocrystal-monohydrate, (MACROBID) 100 MG capsule Take 1 capsule (100 mg total) by mouth 2 (two) times daily for 7 days. 08/28/17 09/04/17  Schuyler Amor, MD  ondansetron (ZOFRAN) 4 MG tablet Take 1 tablet (4 mg total) by mouth every 8 (eight) hours as needed for nausea  or vomiting. 08/28/17   Schuyler Amor, MD  pantoprazole (PROTONIX) 40 MG tablet Take 40 mg by mouth every morning.    [provider]  pravastatin (PRAVACHOL) 10 MG tablet Take 10 mg by mouth at bedtime.    [provider]  sucralfate (CARAFATE) 1 g tablet Take 1 tablet (1 g total) by mouth 4 (four) times daily. 07/09/16   Marycatherine Mew, MD    REVIEW OF SYSTEMS:  Review of Systems  Constitutional: Negative for chills, fever, malaise/fatigue and weight loss.  HENT: Negative for ear pain, hearing loss and tinnitus.   Eyes: Negative for blurred vision, double vision, pain and redness.  Respiratory: Negative for cough, hemoptysis and shortness of breath.  Cardiovascular: Negative for chest pain, palpitations, orthopnea and leg swelling.  Gastrointestinal: Positive for abdominal pain, nausea and vomiting. Negative for constipation and diarrhea.  Genitourinary: Negative for dysuria, frequency and hematuria.  Musculoskeletal: Negative for back pain, joint pain and neck pain.  Skin:       No acne, rash, or lesions  Neurological: Negative for dizziness, tremors, focal weakness and weakness.  Endo/Heme/Allergies: Negative for polydipsia. Does not bruise/bleed easily.  Psychiatric/Behavioral: Negative for depression. The patient is not nervous/anxious and does not have insomnia.      VITAL SIGNS:   Vitals:   08/29/17 1908 08/29/17 2156  BP: 114/67 110/79  Pulse: 69 67  Resp: 18 18  Temp: (!) 97.5 F (36.4 C)   SpO2: 98% 97%  Weight: 65.8 kg   Height: 5' (1.524 m)    Wt Readings from Last 3 Encounters:  08/29/17 65.8 kg  08/28/17 70.3 kg  07/11/16 77.6 kg    PHYSICAL EXAMINATION:  Physical Exam  Vitals reviewed. Constitutional: [name] oriented to person, place, and time. She appears well-developed and well-nourished. No distress.  HENT:  Head: Normocephalic and atraumatic.  Mouth/Throat: Oropharynx is clear and moist.  Eyes: Pupils are equal, round, and  reactive to light. Conjunctivae and EOM are normal. No scleral icterus.  Neck: Normal range of motion. Neck supple. No JVD present. No thyromegaly present.  Cardiovascular: Normal rate, regular rhythm and intact distal pulses. Exam reveals no gallop and no friction rub.  No murmur heard. Respiratory: Effort normal and breath sounds normal. No respiratory distress. She has no wheezes. She has no rales.  GI: Soft. Bowel sounds are normal. She exhibits no distension. There is tenderness.  Musculoskeletal: Normal range of motion. She exhibits no edema.  No arthritis, no gout  Lymphadenopathy:    She has no cervical adenopathy.  Neurological: [name] alert and oriented to person, place, and time. No cranial nerve deficit.  No dysarthria, no aphasia  Skin: Skin is warm and dry. No rash noted. No erythema.  Psychiatric: She has a normal mood and affect. Her behavior is normal. Judgment and thought content normal.    LABORATORY PANEL:   CBC Recent Labs  Lab 08/29/17 1913  WBC 16.5*  HGB 12.7  HCT 37.8  PLT 393   ------------------------------------------------------------------------------------------------------------------  Chemistries  Recent Labs  Lab 08/29/17 1913  NA 139  K 2.7*  CL 102  CO2 23  GLUCOSE 123*  BUN 6  CREATININE 0.62  CALCIUM 7.3*  AST 91*  ALT 36  ALKPHOS 126  BILITOT 1.6*   ------------------------------------------------------------------------------------------------------------------  Cardiac Enzymes No results for input(s): TROPONINI in the last 168 hours. ------------------------------------------------------------------------------------------------------------------  RADIOLOGY:  Ct Abdomen Pelvis W Contrast  Result Date: 08/28/2017 CLINICAL DATA:  Abdominal pain x1 week, vomiting EXAM: CT ABDOMEN AND PELVIS WITH CONTRAST TECHNIQUE: Multidetector CT imaging of the abdomen and pelvis was performed using the standard protocol following bolus  administration of intravenous contrast. CONTRAST:  180m ISOVUE-300 IOPAMIDOL (ISOVUE-300) INJECTION 61% COMPARISON:  07/11/2016 FINDINGS: Lower chest: Mild dependent atelectasis at the lung bases. Hepatobiliary: Suspected geographic hepatic steatosis. Gallbladder is unremarkable. No intrahepatic or extrahepatic ductal dilatation. Pancreas: Within normal limits. Spleen: Within normal limits. Adrenals/Urinary Tract: Adrenal glands within normal limits. Kidneys are within normal limits.  No hydronephrosis. Bladder is underdistended but unremarkable. Stomach/Bowel: Stomach is within normal limits. No evidence of bowel obstruction. Normal appendix (series 2/image 53). Mild sigmoid diverticulosis, without evidence of diverticulitis. Vascular/Lymphatic: No evidence of abdominal aortic aneurysm. Small upper  abdominal lymph nodes, including a 14 mm short axis periportal node (series 2/image 24), previously 9 mm, likely reactive. No suspicious retroperitoneal/pelvic lymphadenopathy. Reproductive: Uterine fibroids, including a dominant 4.5 cm subserosal posterior fundal fibroid (series 2/image 64), grossly unchanged. Bilateral ovaries are grossly unremarkable. Bilateral tubal ligation clips. Other: No abdominopelvic ascites. Musculoskeletal: Degenerative changes the visualized thoracolumbar spine. IMPRESSION: No evidence of bowel obstruction. Normal appendix. Mild sigmoid diverticulosis, without evidence of diverticulitis. Geographic hepatic steatosis. Small upper abdominal lymph nodes, likely reactive. Uterine fibroids. Electronically Signed   By: Julian Hy M.D.   On: 08/28/2017 13:23    EKG:   Orders placed or performed during the hospital encounter of 08/29/17  . ED EKG  . ED EKG    IMPRESSION AND PLAN:  Principal Problem:   Pancreatitis -likely alcohol induced, IV fluids, PRN analgesia and antiemetics, and lipase, n.p.o. for now Active Problems:   Hypokalemia -due to her profound vomiting, will  replace her potassium level and monitor, check a magnesium level   Hypocalcemia -also likely due to her vomiting, she has been given IV calcium in the ED, will monitor her level closely   Diabetes ([place]) -sliding scale insulin with corresponding glucose checks   HTN (hypertension) -home dose antihypertensives   HLD (hyperlipidemia) -Home dose antilipid   GERD (gastroesophageal reflux disease) -Home dose PPI  Chart review performed and case discussed with ED provider. Labs, imaging and/or ECG reviewed by provider and discussed with patient/family. Management plans discussed with the patient and/or family.  DVT PROPHYLAXIS: SubQ lovenox   GI PROPHYLAXIS:  PPI and H2 blocker at home doses  ADMISSION STATUS: Inpatient     CODE STATUS: Full Code Status History    Date Active Date Inactive Code Status Order ID Comments User Context   01/14/2015 1214 01/17/2015 1844 Full Code [id number]  Hessie Knows, MD Inpatient      TOTAL TIME TAKING CARE OF THIS PATIENT: 45 minutes.   Laren Orama Giles 08/29/2017, 10:02 PM  [facility]  Office  [phone]  CC: Primary care physician; Ellamae Sia, MD  Note:  This document was prepared using Dragon voice recognition software and may include unintentional dictation errors.

## 2017-08-30 LAB — COMPREHENSIVE METABOLIC PANEL
ALT: 31 U/L (ref 0–44)
AST: 69 U/L — AB (ref 15–41)
Albumin: 3.4 g/dL — ABNORMAL LOW (ref 3.5–5.0)
Alkaline Phosphatase: 112 U/L (ref 38–126)
Anion gap: 9 (ref 5–15)
BUN: 7 mg/dL (ref 6–20)
CHLORIDE: 106 mmol/L (ref 98–111)
CO2: 23 mmol/L (ref 22–32)
Calcium: 7.2 mg/dL — ABNORMAL LOW (ref 8.9–10.3)
Creatinine, Ser: 0.54 mg/dL (ref 0.44–1.00)
Glucose, Bld: 114 mg/dL — ABNORMAL HIGH (ref 70–99)
Potassium: 3.9 mmol/L (ref 3.5–5.1)
Sodium: 138 mmol/L (ref 135–145)
Total Bilirubin: 1.4 mg/dL — ABNORMAL HIGH (ref 0.3–1.2)
Total Protein: 6.7 g/dL (ref 6.5–8.1)

## 2017-08-30 LAB — GLUCOSE, CAPILLARY
GLUCOSE-CAPILLARY: 114 mg/dL — AB (ref 70–99)
GLUCOSE-CAPILLARY: 91 mg/dL (ref 70–99)
Glucose-Capillary: 77 mg/dL (ref 70–99)
Glucose-Capillary: 79 mg/dL (ref 70–99)

## 2017-08-30 LAB — LIPASE, BLOOD: LIPASE: 32 U/L (ref 11–51)

## 2017-08-30 LAB — CBC
HCT: 35.4 % (ref 35.0–47.0)
Hemoglobin: 11.6 g/dL — ABNORMAL LOW (ref 12.0–16.0)
MCH: 27.2 pg (ref 26.0–34.0)
MCHC: 32.6 g/dL (ref 32.0–36.0)
MCV: 83.4 fL (ref 80.0–100.0)
PLATELETS: 344 10*3/uL (ref 150–440)
RBC: 4.24 MIL/uL (ref 3.80–5.20)
RDW: 15.8 % — ABNORMAL HIGH (ref 11.5–14.5)
WBC: 13.5 10*3/uL — AB (ref 3.6–11.0)

## 2017-08-30 LAB — MAGNESIUM: MAGNESIUM: 3.1 mg/dL — AB (ref 1.7–2.4)

## 2017-08-30 MED ORDER — SODIUM CHLORIDE 0.9 % IV SOLN
1.0000 g | Freq: Once | INTRAVENOUS | Status: AC
  Administered 2017-08-30: 1 g via INTRAVENOUS
  Filled 2017-08-30: qty 10

## 2017-08-30 MED ORDER — SODIUM CHLORIDE 0.9 % IV SOLN
INTRAVENOUS | Status: AC
  Administered 2017-08-30: via INTRAVENOUS

## 2017-08-30 NOTE — Progress Notes (Signed)
Family Meeting Note  Advance Directive:yes  Today a meeting took place with the Patient and daughter at bed side     The following clinical team members were present during this meeting:MD  The following were discussed:Patient's diagnosis: Acute pancreatitis, nausea and vomiting, hypokalemia, hypocalcemia, alcohol abuse, treatment plan of care discussed in detail with the patient.  The following comorbidities as documented below and plan of care also discussed     Anemia   . Arthritis   . Asthma   . COPD (chronic obstructive pulmonary disease) ([place])   . DDD (degenerative disc disease), lumbar   . Diabetes mellitus without complication ([place])    on metformin  . DJD (degenerative joint disease)   . Dysrhythmia   . Fatty liver   . Fibroids   . GERD (gastroesophageal reflux disease)   . Glaucoma   . Hyperlipidemia   . Hypertension   . IBS (irritable bowel syndrome)   . Lumbago   . Neuromuscular disorder ([place])   . 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[place]) 03/2120  . Renal disorder   . Ulcerative colitis ([place])    on mesalamine    Patient's progosis: > 12 months and Goals for treatment: Full Code, daughter Calani Gick is a healthcare POA  Additional follow-up to be provided: hospitalist   Time spent during discussion:59mn  ANicholes Mango MD

## 2017-08-30 NOTE — Progress Notes (Addendum)
[place] [facility] [place] NAME: Sheila [name]    MR#:  [id number]  DATE OF BIRTH:  05-26-1960  SUBJECTIVE:  CHIEF COMPLAINT: Patient reporting abdominal discomfort but denies any nausea or vomiting.  Admits to drinking alcohol  REVIEW OF SYSTEMS:  CONSTITUTIONAL: No fever, fatigue or weakness.  EYES: No blurred or double vision.  EARS, NOSE, AND THROAT: No tinnitus or ear pain.  RESPIRATORY: No cough, shortness of breath, wheezing or hemoptysis.  CARDIOVASCULAR: No chest pain, orthopnea, edema.  GASTROINTESTINAL: No nausea, vomiting, diarrhea.  Reporting epigastric abdominal pain.  GENITOURINARY: No dysuria, hematuria.  ENDOCRINE: No polyuria, nocturia,  HEMATOLOGY: No anemia, easy bruising or bleeding SKIN: No rash or lesion. MUSCULOSKELETAL: No joint pain or arthritis.   NEUROLOGIC: No tingling, numbness, weakness.  PSYCHIATRY: No anxiety or depression.   DRUG ALLERGIES:   Allergies  Allergen Reactions  . Ampicillin Anaphylaxis  . Erythromycin Anaphylaxis  . Omeprazole Anaphylaxis  . Penicillins Anaphylaxis and Other (See Comments)    Has patient had a PCN reaction causing immediate rash, facial/tongue/throat swelling, SOB or lightheadedness with hypotension: Yes Has patient had a PCN reaction causing severe rash involving mucus membranes or skin necrosis: No Has patient had a PCN reaction that required hospitalization: Unknown Has patient had a PCN reaction occurring within the last 10 years: Yes If all of the above answers are "NO", then may proceed with Cephalosporin use.   . Quinapril Cough  . Triamterene-Hctz Other (See Comments)    VITALS:  Blood pressure 105/71, pulse (!) 59, temperature (!) 97.5 F (36.4 C), resp. rate 16, height 5' (1.524 m), weight 65.8 kg, SpO2 96 %.  PHYSICAL EXAMINATION:  GENERAL:  57 y.o.-year-old patient lying in the bed with no acute distress.  EYES: Pupils equal, round, reactive to  light and accommodation. No scleral icterus. Extraocular muscles intact.  HEENT: Head atraumatic, normocephalic. Oropharynx and nasopharynx clear.  NECK:  Supple, no jugular venous distention. No thyroid enlargement, no tenderness.  LUNGS: Normal breath sounds bilaterally, no wheezing, rales,rhonchi or crepitation. No use of accessory muscles of respiration.  CARDIOVASCULAR: S1, S2 normal. No murmurs, rubs, or gallops.  ABDOMEN: Soft, minimal epigastric tenderness is present, no rebound tenderness nondistended. Bowel sounds present. EXTREMITIES: No pedal edema, cyanosis, or clubbing.  NEUROLOGIC: Cranial nerves II through XII are intact. Muscle strength 5/5 in all extremities. Sensation intact. Gait not checked.  PSYCHIATRIC: The patient is alert and oriented x 3.  SKIN: No obvious rash, lesion, or ulcer.    LABORATORY PANEL:   CBC Recent Labs  Lab 08/30/17 0430  WBC 13.5*  HGB 11.6*  HCT 35.4  PLT 344   ------------------------------------------------------------------------------------------------------------------  Chemistries  Recent Labs  Lab 08/30/17 0430  NA 138  K 3.9  CL 106  CO2 23  GLUCOSE 114*  BUN 7  CREATININE 0.54  CALCIUM 7.2*  MG 3.1*  AST 69*  ALT 31  ALKPHOS 112  BILITOT 1.4*   ------------------------------------------------------------------------------------------------------------------  Cardiac Enzymes No results for input(s): TROPONINI in the last 168 hours. ------------------------------------------------------------------------------------------------------------------  RADIOLOGY:  No results found.  EKG:   Orders placed or performed during the hospital encounter of 08/29/17  . ED EKG  . ED EKG    ASSESSMENT AND PLAN:     #Acute pancreatitis secondary to alcohol intake Continue IV fluids and pain management as needed Start clear liquid diet to supper Lipase at 32 Strict abstinence from alcohol advised  #Recent history of  urinary tract  infection Urine culture is contaminated from August 25.  Repeat urine culture on August 26 is pending. Patient is asymptomatic and not considering antibiotics at this time  #Hypocalcemia Albumin at 3.4 which is almost normal Replete with calcium gluconate IV and recheck in a.m.  #Hypokalemia secondary to vomiting Repleted and potassium 3.9 today  #Essential hypertension-blood pressure is soft hold antihypertensives chlorthalidone and Cozaar  # diabetes mellitus sliding scale insulin while patient is n.p.o.  Hold metformin  #GERD PPI     All the records are reviewed and case discussed with Care Management/Social Workerr. Management plans discussed with the patient, family and they are in agreement.  CODE STATUS: FC  TOTAL TIME TAKING CARE OF THIS PATIENT: 36 minutes.   POSSIBLE D/C IN 1-2  DAYS, DEPENDING ON CLINICAL CONDITION.  Note: This dictation was prepared with Dragon dictation along with smaller phrase technology. Any transcriptional errors that result from this process are unintentional.   Nicholes Mango M.D on 08/30/2017 at 1:30 PM  Between 7am to 6pm - Pager - [phone] After 6pm go to [email or web address] - password [email or web address] [facility]  Office  [phone]  CC: Primary care physician; Ellamae Sia, MD

## 2017-08-31 LAB — CBC
HCT: 31.8 % — ABNORMAL LOW (ref 35.0–47.0)
Hemoglobin: 10.5 g/dL — ABNORMAL LOW (ref 12.0–16.0)
MCH: 27.5 pg (ref 26.0–34.0)
MCHC: 33 g/dL (ref 32.0–36.0)
MCV: 83.3 fL (ref 80.0–100.0)
PLATELETS: 339 10*3/uL (ref 150–440)
RBC: 3.82 MIL/uL (ref 3.80–5.20)
RDW: 15.5 % — AB (ref 11.5–14.5)
WBC: 12.7 10*3/uL — ABNORMAL HIGH (ref 3.6–11.0)

## 2017-08-31 LAB — GLUCOSE, CAPILLARY
Glucose-Capillary: 97 mg/dL (ref 70–99)
Glucose-Capillary: 96 mg/dL (ref 70–99)
Glucose-Capillary: 106 mg/dL — ABNORMAL HIGH (ref 70–99)
Glucose-Capillary: 88 mg/dL (ref 70–99)
Glucose-Capillary: 100 mg/dL — ABNORMAL HIGH (ref 70–99)

## 2017-08-31 LAB — BASIC METABOLIC PANEL
Anion gap: 9 (ref 5–15)
BUN: 5 mg/dL — AB (ref 6–20)
CALCIUM: 7.1 mg/dL — AB (ref 8.9–10.3)
CO2: 21 mmol/L — AB (ref 22–32)
CREATININE: 0.5 mg/dL (ref 0.44–1.00)
Chloride: 109 mmol/L (ref 98–111)
GFR calc Af Amer: >60 mL/min (ref >60)
GLUCOSE: 98 mg/dL (ref 70–99)
Potassium: 3.4 mmol/L — ABNORMAL LOW (ref 3.5–5.1)
Sodium: 139 mmol/L (ref 135–145)

## 2017-08-31 LAB — URINE CULTURE: Culture: <10000 — AB

## 2017-08-31 LAB — MAGNESIUM: Magnesium: 1.6 mg/dL — ABNORMAL LOW (ref 1.7–2.4)

## 2017-08-31 LAB — HIV ANTIBODY (ROUTINE TESTING W REFLEX): HIV Screen 4th Generation wRfx: NONREACTIVE

## 2017-08-31 LAB — LIPASE, BLOOD: LIPASE: 31 U/L (ref 11–51)

## 2017-08-31 MED ORDER — MAGNESIUM SULFATE 2 GM/50ML IV SOLN
2.0000 g | Freq: Once | INTRAVENOUS | Status: AC
  Administered 2017-08-31: 2 g via INTRAVENOUS
  Filled 2017-08-31: qty 50

## 2017-08-31 MED ORDER — POTASSIUM CHLORIDE CRYS ER 20 MEQ PO TBCR
40.0000 meq | EXTENDED_RELEASE_TABLET | Freq: Once | ORAL | Status: AC
  Administered 2017-08-31: 40 meq via ORAL
  Filled 2017-08-31: qty 2

## 2017-08-31 NOTE — Progress Notes (Signed)
Sheila [name] at [place] NAME: Sheila [name]    MR#:  [id number]  DATE OF BIRTH:  October 21, 1960  SUBJECTIVE:  CHIEF COMPLAINT: Patient reporting epigastric abdominal pain and asking not to advance her diet, just want to continue clear liquids at this point , denies any nausea or vomiting.  Admits drinking alcohol  REVIEW OF SYSTEMS:  CONSTITUTIONAL: No fever, fatigue or weakness.  EYES: No blurred or double vision.  EARS, NOSE, AND THROAT: No tinnitus or ear pain.  RESPIRATORY: No cough, shortness of breath, wheezing or hemoptysis.  CARDIOVASCULAR: No chest pain, orthopnea, edema.  GASTROINTESTINAL: No nausea, vomiting, diarrhea.  Reporting epigastric abdominal pain.  GENITOURINARY: No dysuria, hematuria.  ENDOCRINE: No polyuria, nocturia,  HEMATOLOGY: No anemia, easy bruising or bleeding SKIN: No rash or lesion. MUSCULOSKELETAL: No joint pain or arthritis.   NEUROLOGIC: No tingling, numbness, weakness.  PSYCHIATRY: No anxiety or depression.   DRUG ALLERGIES:   Allergies  Allergen Reactions  . Ampicillin Anaphylaxis  . Erythromycin Anaphylaxis  . Omeprazole Anaphylaxis  . Penicillins Anaphylaxis and Other (See Comments)    Has patient had a PCN reaction causing immediate rash, facial/tongue/throat swelling, SOB or lightheadedness with hypotension: Yes Has patient had a PCN reaction causing severe rash involving mucus membranes or skin necrosis: No Has patient had a PCN reaction that required hospitalization: Unknown Has patient had a PCN reaction occurring within the last 10 years: Yes If all of the above answers are "NO", then may proceed with Cephalosporin use.   . Quinapril Cough  . Triamterene-Hctz Other (See Comments)    VITALS:  Blood pressure (!) 113/57, pulse 67, temperature 97.9 F (36.6 C), temperature source Oral, resp. rate 20, height 5' (1.524 m), weight 65.8 kg, SpO2 97 %.  PHYSICAL EXAMINATION:  GENERAL:  57  y.o.-year-old patient lying in the bed with no acute distress.  EYES: Pupils equal, round, reactive to light and accommodation. No scleral icterus. Extraocular muscles intact.  HEENT: Head atraumatic, normocephalic. Oropharynx and nasopharynx clear.  NECK:  Supple, no jugular venous distention. No thyroid enlargement, no tenderness.  LUNGS: Normal breath sounds bilaterally, no wheezing, rales,rhonchi or crepitation. No use of accessory muscles of respiration.  CARDIOVASCULAR: S1, S2 normal. No murmurs, rubs, or gallops.  ABDOMEN: Soft, minimal epigastric tenderness is present, no rebound tenderness nondistended. Bowel sounds present. EXTREMITIES: No pedal edema, cyanosis, or clubbing.  NEUROLOGIC: Cranial nerves II through XII are intact. Muscle strength 5/5 in all extremities. Sensation intact. Gait not checked.  PSYCHIATRIC: The patient is alert and oriented x 3.  SKIN: No obvious rash, lesion, or ulcer.    LABORATORY PANEL:   CBC Recent Labs  Lab 08/31/17 0330  WBC 12.7*  HGB 10.5*  HCT 31.8*  PLT 339   ------------------------------------------------------------------------------------------------------------------  Chemistries  Recent Labs  Lab 08/30/17 0430 08/31/17 0330  NA 138 139  K 3.9 3.4*  CL 106 109  CO2 23 21*  GLUCOSE 114* 98  BUN 7 5*  CREATININE 0.54 0.50  CALCIUM 7.2* 7.1*  MG 3.1* 1.6*  AST 69*  --   ALT 31  --   ALKPHOS 112  --   BILITOT 1.4*  --    ------------------------------------------------------------------------------------------------------------------  Cardiac Enzymes No results for input(s): TROPONINI in the last 168 hours. ------------------------------------------------------------------------------------------------------------------  RADIOLOGY:  No results found.  EKG:   Orders placed or performed during the hospital encounter of 08/29/17  . ED EKG  . ED EKG    ASSESSMENT  AND PLAN:     #Acute pancreatitis secondary  to alcohol intake Continue IV fluids and pain management as needed  clear liquid diet for today and advance diet as tolerated in a.m. if patient is feeling better Lipase at 32--31 Strict abstinence from alcohol advised  #Recent history of urinary tract infection Urine culture is contaminated from August 25.  Repeat urine culture on August 26 with less than 10,000 colonies insignificant growth Patient is asymptomatic and not considering antibiotics at this time  #Hypocalcemia Albumin at 3.4 which is almost normal Replete with calcium gluconate IV and recheck in a.m.  #Hypokalemia and hypomagnesemia secondary to vomiting Replete potassium and magnesium  #Essential hypertension-blood pressure is soft hold antihypertensives chlorthalidone and Cozaar  # diabetes mellitus sliding scale insulin   Hold metformin  #GERD PPI     All the records are reviewed and case discussed with Care Management/Social Workerr. Management plans discussed with the patient, family and they are in agreement.  CODE STATUS: FC  TOTAL TIME TAKING CARE OF THIS PATIENT: 36 minutes.   POSSIBLE D/C IN 1-2  DAYS, DEPENDING ON CLINICAL CONDITION.  Note: This dictation was prepared with Dragon dictation along with smaller phrase technology. Any transcriptional errors that result from this process are unintentional.   Nicholes Mango M.D on 08/31/2017 at 12:23 PM  Between 7am to 6pm - Pager - [phone] After 6pm go to [email or web address] - password [email or web address] [facility]  Office  [phone]  CC: Primary care physician; Ellamae Sia, MD

## 2017-09-01 LAB — GLUCOSE, CAPILLARY
GLUCOSE-CAPILLARY: 108 mg/dL — AB (ref 70–99)
Glucose-Capillary: 107 mg/dL — ABNORMAL HIGH (ref 70–99)
Glucose-Capillary: 108 mg/dL — ABNORMAL HIGH (ref 70–99)

## 2017-09-01 LAB — MAGNESIUM: MAGNESIUM: 1.6 mg/dL — AB (ref 1.7–2.4)

## 2017-09-01 LAB — POTASSIUM: Potassium: 3.5 mmol/L (ref 3.5–5.1)

## 2017-09-01 MED ORDER — MAGNESIUM OXIDE -MG SUPPLEMENT 200 MG PO TABS: 400.0000 mg | ORAL_TABLET | Freq: Two times a day (BID) | ORAL | 0 refills | Status: DC

## 2017-09-01 MED ORDER — OXYCODONE HCL 5 MG PO TABS: 5.0000 mg | ORAL_TABLET | ORAL | 0 refills | Status: AC | PRN

## 2017-09-01 MED ORDER — ONDANSETRON HCL 4 MG PO TABS: 4.0000 mg | ORAL_TABLET | Freq: Four times a day (QID) | ORAL | 0 refills | Status: DC | PRN

## 2017-09-01 NOTE — Discharge Summary (Signed)
[facility] [place] at [facility], 57 y.o., DOB 05/18/1960, MRN [id number]. Admission date: 08/29/2017 Discharge Date 09/01/2017 Primary MD Ellamae Sia, MD Admitting Physician Lance Coon, MD  Admission Diagnosis  Hypocalcemia [E83.51] Hypokalemia [E87.6] Nausea vomiting and diarrhea [R11.2, R19.7] Acute pancreatitis, unspecified complication status, unspecified pancreatitis type [K85.90]  Discharge Diagnosis   Principal Problem: Acute pancreatitis due to alcohol abuse Hypocalcemia Hypo kalemia Hypomagnesemia Diabetes (HCC) HTN (hypertension) HLD (hyperlipidemia) GERD (gastroesophageal reflux disease)        Hospital Course  Sheila [name]  is a 57 y.o. female who presents with chief complaint as above.  Patient presents with 1 week of abdominal pain, profound nausea with significant vomiting.  Patient was noted to have acute pancreatitis.  Due to alcohol abuse.  Patient was kept n.p.o. given IV fluids with supportive care.  Now she is doing much better stable for discharge.  Strongly recommended her from drinking            Consults  None  Significant Tests:  See full reports for all details     Ct Abdomen Pelvis W Contrast  Result Date: 08/28/2017 CLINICAL DATA:  Abdominal pain x1 week, vomiting EXAM: CT ABDOMEN AND PELVIS WITH CONTRAST TECHNIQUE: Multidetector CT imaging of the abdomen and pelvis was performed using the standard protocol following bolus administration of intravenous contrast. CONTRAST:  118m ISOVUE-300 IOPAMIDOL (ISOVUE-300) INJECTION 61% COMPARISON:  07/11/2016 FINDINGS: Lower chest: Mild dependent atelectasis at the lung bases. Hepatobiliary: Suspected geographic hepatic steatosis. Gallbladder is unremarkable. No intrahepatic or extrahepatic ductal dilatation. Pancreas: Within normal limits. Spleen: Within normal limits. Adrenals/Urinary Tract: Adrenal glands within normal limits. Kidneys are within normal  limits.  No hydronephrosis. Bladder is underdistended but unremarkable. Stomach/Bowel: Stomach is within normal limits. No evidence of bowel obstruction. Normal appendix (series 2/image 53). Mild sigmoid diverticulosis, without evidence of diverticulitis. Vascular/Lymphatic: No evidence of abdominal aortic aneurysm. Small upper abdominal lymph nodes, including a 14 mm short axis periportal node (series 2/image 24), previously 9 mm, likely reactive. No suspicious retroperitoneal/pelvic lymphadenopathy. Reproductive: Uterine fibroids, including a dominant 4.5 cm subserosal posterior fundal fibroid (series 2/image 64), grossly unchanged. Bilateral ovaries are grossly unremarkable. Bilateral tubal ligation clips. Other: No abdominopelvic ascites. Musculoskeletal: Degenerative changes the visualized thoracolumbar spine. IMPRESSION: No evidence of bowel obstruction. Normal appendix. Mild sigmoid diverticulosis, without evidence of diverticulitis. Geographic hepatic steatosis. Small upper abdominal lymph nodes, likely reactive. Uterine fibroids. Electronically Signed   By: SJulian HyM.D.   On: 08/28/2017 13:23       Today   Subjective:   Sheila [name] is doing well denies any complaints  Objective:   Blood pressure 129/79, pulse 73, temperature 97.8 F (36.6 C), temperature source Oral, resp. rate 20, height 5' (1.524 m), weight 65.8 kg, SpO2 96 %.  .  Intake/Output Summary (Last 24 hours) at 09/01/2017 1442 Last data filed at 09/01/2017 1412 Gross per 24 hour  Intake 999.75 ml  Output 400 ml  Net 599.75 ml    Exam VITAL SIGNS: Blood pressure 129/79, pulse 73, temperature 97.8 F (36.6 C), temperature source Oral, resp. rate 20, height 5' (1.524 m), weight 65.8 kg, SpO2 96 %.  GENERAL:  57y.o.-year-old patient lying in the bed with no acute distress.  EYES: Pupils equal, round, reactive to light and accommodation. No scleral icterus. Extraocular muscles intact.  HEENT: Head  atraumatic, normocephalic. Oropharynx and nasopharynx clear.  NECK:  Supple, no jugular venous distention. No thyroid  enlargement, no tenderness.  LUNGS: Normal breath sounds bilaterally, no wheezing, rales,rhonchi or crepitation. No use of accessory muscles of respiration.  CARDIOVASCULAR: S1, S2 normal. No murmurs, rubs, or gallops.  ABDOMEN: Soft, nontender, nondistended. Bowel sounds present. No organomegaly or mass.  EXTREMITIES: No pedal edema, cyanosis, or clubbing.  NEUROLOGIC: Cranial nerves II through XII are intact. Muscle strength 5/5 in all extremities. Sensation intact. Gait not checked.  PSYCHIATRIC: The patient is alert and oriented x 3.  SKIN: No obvious rash, lesion, or ulcer.   Data Review     CBC w Diff:  Lab Results  Component Value Date   WBC 12.7 (H) 08/31/2017   HGB 10.5 (L) 08/31/2017   HGB 9.0 (L) 11/12/2013   HCT 31.8 (L) 08/31/2017   HCT 27.5 (L) 11/12/2013   PLT 339 08/31/2017   PLT 498 (H) 11/12/2013   LYMPHOPCT 24 07/09/2016   LYMPHOPCT 28.2 11/12/2013   MONOPCT 12 07/09/2016   MONOPCT 11.7 11/12/2013   EOSPCT 3 07/09/2016   EOSPCT 2.4 11/12/2013   BASOPCT 1 07/09/2016   BASOPCT 1.1 11/12/2013   CMP:  Lab Results  Component Value Date   NA 139 08/31/2017   NA 147 (H) 11/12/2013   K 3.5 09/01/2017   K 3.9 11/12/2013   CL 109 08/31/2017   CL 113 (H) 11/12/2013   CO2 21 (L) 08/31/2017   CO2 24 11/12/2013   BUN 5 (L) 08/31/2017   BUN 25 (H) 11/12/2013   CREATININE 0.50 08/31/2017   CREATININE 1.00 11/12/2013   PROT 6.7 08/30/2017   PROT 7.2 10/11/2012   ALBUMIN 3.4 (L) 08/30/2017   ALBUMIN 3.9 10/11/2012   BILITOT 1.4 (H) 08/30/2017   BILITOT 0.8 10/11/2012   ALKPHOS 112 08/30/2017   ALKPHOS 72 10/11/2012   AST 69 (H) 08/30/2017   AST 38 (H) 10/11/2012   ALT 31 08/30/2017   ALT 33 10/11/2012  .  Micro Results Recent Results (from the past 240 hour(s))  Urine culture     Status: Abnormal   Collection Time: 08/28/17 12:05 PM   Result Value Ref Range Status   Specimen Description   Final    URINE, RANDOM Performed at [facility], [address], [place], [place] [address]    Special Requests   Final    NONE Performed at [facility] [address] [place], [place] [address]    Culture MULTIPLE SPECIES PRESENT, SUGGEST RECOLLECTION (A)  Final   Report Status 08/29/2017 FINAL  Final  Urine Culture     Status: Abnormal   Collection Time: 08/29/17  9:30 PM  Result Value Ref Range Status   Specimen Description   Final    URINE, RANDOM Performed at [facility], [address] [place], [place] [address]    Special Requests   Final    NONE Performed at [facility], [address] [place], [place] [address]    Culture (A)  Final    <10,000 COLONIES/mL INSIGNIFICANT GROWTH Performed at [facility], [place] [address], [place], [place] [address]    Report Status 08/31/2017 FINAL  Final        Code Status Orders  (From admission, onward)         Start     Ordered   08/29/17 2315  Full code  Continuous     08/29/17 2314        Code Status History    Date Active Date Inactive Code Status Order  ID Comments User Context   01/14/2015 1214 01/17/2015 1844 Full Code [id number]  Hessie Knows, MD Inpatient          Follow-up Information    Ellamae Sia, MD. Go on 09/14/2017.   Specialty:  Internal Medicine Why:  @11am  Contact information: [place] [address] [phone]           Discharge Medications   Allergies as of 09/01/2017      Reactions   Ampicillin Anaphylaxis   Erythromycin Anaphylaxis   Omeprazole Anaphylaxis   Penicillins Anaphylaxis, Other (See Comments)   Has patient had a PCN reaction causing immediate rash, facial/tongue/throat swelling, SOB or lightheadedness with hypotension: Yes Has patient had a PCN reaction causing severe rash involving mucus membranes or skin necrosis: No Has patient  had a PCN reaction that required hospitalization: Unknown Has patient had a PCN reaction occurring within the last 10 years: Yes If all of the above answers are "NO", then may proceed with Cephalosporin use.   Quinapril Cough   Triamterene-hctz Other (See Comments)      Medication List    TAKE these medications   albuterol 108 (90 Base) MCG/ACT inhaler Commonly known as:  PROVENTIL HFA;VENTOLIN HFA Inhale 2 puffs into the lungs every 4 (four) hours as needed for wheezing or shortness of breath.   allopurinol 100 MG tablet Commonly known as:  ZYLOPRIM Take 100 mg by mouth daily.   bimatoprost 0.03 % ophthalmic solution Commonly known as:  LUMIGAN Place 1 drop into both eyes at bedtime.   chlorthalidone 25 MG tablet Commonly known as:  HYGROTON Take 25 mg by mouth daily.   [employer] VITAMIN B12 1000 MCG tablet Generic drug:  cyanocobalamin Take 1,000 mcg by mouth daily.   dorzolamide 2 % ophthalmic solution Commonly known as:  TRUSOPT Place 1 drop into both eyes 2 (two) times daily.   KLOR-CON M20 20 MEQ tablet Generic drug:  potassium chloride SA Take 40 mEq by mouth daily.   losartan 25 MG tablet Commonly known as:  COZAAR Take 25 mg by mouth daily.   Magnesium Oxide 200 MG Tabs Take 2 tablets (400 mg total) by mouth 2 (two) times daily.   mesalamine 0.375 g 24 hr capsule Commonly known as:  APRISO Take 1,500 mg by mouth daily.   metFORMIN 500 MG 24 hr tablet Commonly known as:  GLUCOPHAGE-XR Take 500 mg by mouth 2 (two) times daily.   metoprolol succinate 25 MG 24 hr tablet Commonly known as:  TOPROL-XL Take 1 tablet (25 mg total) by mouth daily. What changed:  how much to take   nitrofurantoin (macrocrystal-monohydrate) 100 MG capsule Commonly known as:  MACROBID Take 1 capsule (100 mg total) by mouth 2 (two) times daily for 7 days.   ondansetron 4 MG tablet Commonly known as:  ZOFRAN Take 1 tablet (4 mg total) by mouth every 8 (eight) hours as needed for  nausea or vomiting. What changed:  Another medication with the same name was added. Make sure you understand how and when to take each.   ondansetron 4 MG tablet Commonly known as:  ZOFRAN Take 1 tablet (4 mg total) by mouth every 6 (six) hours as needed for nausea. What changed:  You were already taking a medication with the same name, and this prescription was added. Make sure you understand how and when to take each.   oxyCODONE 5 MG immediate release tablet Commonly known as:  Oxy IR/ROXICODONE Take 1 tablet (  5 mg total) by mouth every 4 (four) hours as needed for up to 5 days for moderate pain.   pantoprazole 40 MG tablet Commonly known as:  PROTONIX Take 40 mg by mouth every morning.   pravastatin 10 MG tablet Commonly known as:  PRAVACHOL Take 10 mg by mouth at bedtime.          Total Time in preparing paper work, data evaluation and todays exam - 66 minutes  Dustin Flock M.D on 09/01/2017 at [place]  [phone]

## 2017-10-03 ENCOUNTER — Other Ambulatory Visit: Payer: Self-pay | Admitting: Family Medicine

## 2017-10-03 DIAGNOSIS — Z1231 Encounter for screening mammogram for malignant neoplasm of breast: Secondary | ICD-10-CM

## 2017-10-27 ENCOUNTER — Ambulatory Visit
Admission: RE | Admit: 2017-10-27 | Discharge: 2017-10-27 | Disposition: A | Discharge status: Home / Self Care | Payer: Medicaid Other | Source: Ambulatory Visit | Attending: Family Medicine | Admitting: Family Medicine

## 2017-10-27 DIAGNOSIS — Z1231 Encounter for screening mammogram for malignant neoplasm of breast: Secondary | ICD-10-CM

## 2017-11-07 ENCOUNTER — Encounter: Payer: Self-pay | Admitting: *Deleted

## 2017-11-08 ENCOUNTER — Ambulatory Visit: Payer: Medicaid Other | Admitting: Anesthesiology

## 2017-11-08 ENCOUNTER — Encounter
Admission: RE | Disposition: A | Discharge status: Home / Self Care | Payer: Self-pay | Source: Ambulatory Visit | Attending: Gastroenterology

## 2017-11-08 ENCOUNTER — Ambulatory Visit
Admission: RE | Admit: 2017-11-08 | Discharge: 2017-11-08 | Disposition: A | Discharge status: Home / Self Care | Payer: Medicaid Other | Source: Ambulatory Visit | Attending: Gastroenterology | Admitting: Gastroenterology

## 2017-11-08 DIAGNOSIS — K760 Fatty (change of) liver, not elsewhere classified: Secondary | ICD-10-CM | POA: Diagnosis not present

## 2017-11-08 DIAGNOSIS — K449 Diaphragmatic hernia without obstruction or gangrene: Secondary | ICD-10-CM | POA: Diagnosis not present

## 2017-11-08 DIAGNOSIS — K2970 Gastritis, unspecified, without bleeding: Secondary | ICD-10-CM | POA: Diagnosis not present

## 2017-11-08 DIAGNOSIS — Z7984 Long term (current) use of oral hypoglycemic drugs: Secondary | ICD-10-CM | POA: Diagnosis not present

## 2017-11-08 DIAGNOSIS — E119 Type 2 diabetes mellitus without complications: Secondary | ICD-10-CM | POA: Diagnosis not present

## 2017-11-08 DIAGNOSIS — J449 Chronic obstructive pulmonary disease, unspecified: Secondary | ICD-10-CM | POA: Diagnosis not present

## 2017-11-08 DIAGNOSIS — H409 Unspecified glaucoma: Secondary | ICD-10-CM | POA: Insufficient documentation

## 2017-11-08 DIAGNOSIS — M1990 Unspecified osteoarthritis, unspecified site: Secondary | ICD-10-CM | POA: Diagnosis not present

## 2017-11-08 DIAGNOSIS — K219 Gastro-esophageal reflux disease without esophagitis: Secondary | ICD-10-CM | POA: Insufficient documentation

## 2017-11-08 DIAGNOSIS — R1013 Epigastric pain: Secondary | ICD-10-CM | POA: Insufficient documentation

## 2017-11-08 DIAGNOSIS — I10 Essential (primary) hypertension: Secondary | ICD-10-CM | POA: Diagnosis not present

## 2017-11-08 DIAGNOSIS — Z79899 Other long term (current) drug therapy: Secondary | ICD-10-CM | POA: Diagnosis not present

## 2017-11-08 DIAGNOSIS — E785 Hyperlipidemia, unspecified: Secondary | ICD-10-CM | POA: Diagnosis not present

## 2017-11-08 HISTORY — PX: ESOPHAGOGASTRODUODENOSCOPY (EGD) WITH PROPOFOL: SHX5813

## 2017-11-08 LAB — GLUCOSE, CAPILLARY: Glucose-Capillary: 117 mg/dL — ABNORMAL HIGH (ref 70–99)

## 2017-11-08 SURGERY — ESOPHAGOGASTRODUODENOSCOPY (EGD) WITH PROPOFOL
Anesthesia: General

## 2017-11-08 MED ORDER — MIDAZOLAM HCL 2 MG/2ML IJ SOLN
INTRAMUSCULAR | Status: DC | PRN
  Administered 2017-11-08: 1 mg via INTRAVENOUS

## 2017-11-08 MED ORDER — PROPOFOL 10 MG/ML IV BOLUS
INTRAVENOUS | Status: DC | PRN
  Administered 2017-11-08: 50 mg via INTRAVENOUS

## 2017-11-08 MED ORDER — PROPOFOL 10 MG/ML IV BOLUS
INTRAVENOUS | Status: AC
  Filled 2017-11-08: qty 20

## 2017-11-08 MED ORDER — PROPOFOL 500 MG/50ML IV EMUL
INTRAVENOUS | Status: DC | PRN
  Administered 2017-11-08: 150 ug/kg/min via INTRAVENOUS

## 2017-11-08 MED ORDER — MIDAZOLAM HCL 2 MG/2ML IJ SOLN
INTRAMUSCULAR | Status: AC
  Filled 2017-11-08: qty 2

## 2017-11-08 MED ORDER — SODIUM CHLORIDE 0.9 % IV SOLN
INTRAVENOUS | Status: DC
  Administered 2017-11-08: 10:00:00 via INTRAVENOUS
  Administered 2017-11-08: 1000 mL via INTRAVENOUS

## 2017-11-08 MED ORDER — LIDOCAINE HCL (PF) 1 % IJ SOLN
INTRAMUSCULAR | Status: AC
  Filled 2017-11-08: qty 2

## 2017-11-08 MED ORDER — SODIUM CHLORIDE 0.9 % IV SOLN: INTRAVENOUS | Status: DC

## 2017-11-08 MED ORDER — PROPOFOL 500 MG/50ML IV EMUL
INTRAVENOUS | Status: AC
  Filled 2017-11-08: qty 50

## 2017-11-08 MED ORDER — FENTANYL CITRATE (PF) 100 MCG/2ML IJ SOLN
INTRAMUSCULAR | Status: DC | PRN
  Administered 2017-11-08: 50 ug via INTRAVENOUS

## 2017-11-08 MED ORDER — FENTANYL CITRATE (PF) 100 MCG/2ML IJ SOLN
INTRAMUSCULAR | Status: AC
  Filled 2017-11-08: qty 2

## 2017-11-08 MED ORDER — LIDOCAINE HCL (PF) 2 % IJ SOLN
INTRAMUSCULAR | Status: AC
  Filled 2017-11-08: qty 10

## 2017-11-08 MED ORDER — EPHEDRINE SULFATE 50 MG/ML IJ SOLN
INTRAMUSCULAR | Status: DC | PRN
  Administered 2017-11-08: 10 mg via INTRAVENOUS

## 2017-11-08 MED ORDER — LIDOCAINE HCL (CARDIAC) PF 100 MG/5ML IV SOSY
PREFILLED_SYRINGE | INTRAVENOUS | Status: DC | PRN
  Administered 2017-11-08: 100 mg via INTRATRACHEAL

## 2017-11-08 NOTE — Anesthesia Postprocedure Evaluation (Signed)
Anesthesia Post Note  Patient: Sheila [name]  Procedure(s) Performed: ESOPHAGOGASTRODUODENOSCOPY (EGD) WITH PROPOFOL (N/A )  Patient location during evaluation: PACU Anesthesia Type: General Level of consciousness: awake and alert Pain management: pain level controlled Vital Signs Assessment: post-procedure vital signs reviewed and stable Respiratory status: spontaneous breathing, nonlabored ventilation and respiratory function stable Cardiovascular status: blood pressure returned to baseline and stable Postop Assessment: no apparent nausea or vomiting Anesthetic complications: no     Last Vitals:  Vitals:   11/08/17 1038 11/08/17 1048  BP: (!) 92/46 (!) 92/50  Pulse: 81 74  Resp: (!) 25 (!) 21  Temp:    SpO2: 97% 98%    Last Pain:  Vitals:   11/08/17 1048  TempSrc:   PainSc: 0-No pain                 Durenda Hurt

## 2017-11-08 NOTE — Transfer of Care (Addendum)
Immediate Anesthesia Transfer of Care Note  Patient: Sheila [name]  Procedure(s) Performed: ESOPHAGOGASTRODUODENOSCOPY (EGD) WITH PROPOFOL (N/A )  Patient Location: PACU  Anesthesia Type:General  Level of Consciousness: awake and oriented  Airway & Oxygen Therapy: Patient Spontanous Breathing and Patient connected to nasal cannula oxygen  Post-op Assessment: Report given to RN and Post -op Vital signs reviewed and stable  Post vital signs: Reviewed and stable  Last Vitals:  Vitals Value Taken Time  BP    Temp    Pulse    Resp    SpO2      Last Pain:  Vitals:   11/08/17 0857  PainSc: 0-No pain         Complications: No apparent anesthesia complications

## 2017-11-08 NOTE — H&P (Signed)
Outpatient short stay form Pre-procedure 11/08/2017 10:00 AM Sheila Sails MD  Primary Physician: Dr. Kingsley Spittle  Reason for visit: EGD  History of present illness: [name] a 57 year old female presenting today with a complaint of epigastric pain and a history of gastroesophageal reflux.  This is in the upper epigastric region.  It does not cause her to be nauseated or throw up.  However seem to be worse with eating.  There is some radiation of the pain into the medial right upper quadrant and sometimes radiation toward the back.  She has not had a evaluation regards to her gallbladder.  She knows of no family history of gallbladder issues.  Has had a history of some mildly elevated lipase in the past with normal-appearing CT for pancreas.  Currently taking proton pump inhibitor once daily.  Denies use of any aspirin products or blood thinning agents.    Current Facility-Administered Medications:  .  0.9 %  sodium chloride infusion, , Intravenous, Continuous, Sheila Sails, MD .  0.9 %  sodium chloride infusion, , Intravenous, Continuous, Sheila Sails, MD, Last Rate: 20 mL/hr at 11/08/17 0941, 1,000 mL at 11/08/17 0941 .  lidocaine (PF) (XYLOCAINE) 1 % injection, , , ,   Facility-Administered Medications Ordered in Other Encounters:  .  midazolam (VERSED) injection, , , Anesthesia Intra-op, Allean Found, CRNA, 1 mg at 11/08/17 0955  Medications Prior to Admission  Medication Sig Dispense Refill Last Dose  . colchicine 0.6 MG tablet Take 0.6 mg by mouth daily.   11/08/2017 at 0530  . gabapentin (NEURONTIN) 300 MG capsule Take 300 mg by mouth 3 (three) times daily.     . sucralfate (CARAFATE) 1 g tablet Take 1 g by mouth 2 (two) times daily.     Marland Kitchen albuterol (PROVENTIL HFA;VENTOLIN HFA) 108 (90 BASE) MCG/ACT inhaler Inhale 2 puffs into the lungs every 4 (four) hours as needed for wheezing or shortness of breath.   11/06/2017  . allopurinol (ZYLOPRIM) 100 MG tablet Take 100 mg  by mouth daily.  3 08/29/2017 at Unknown time  . bimatoprost (LUMIGAN) 0.03 % ophthalmic solution Place 1 drop into both eyes at bedtime.   08/29/2017 at Unknown time  . chlorthalidone (HYGROTON) 25 MG tablet Take 25 mg by mouth daily.   3 08/29/2017 at Unknown time  . CVS VITAMIN B12 1000 MCG tablet Take 1,000 mcg by mouth daily.  3 08/29/2017 at Unknown time  . dorzolamide (TRUSOPT) 2 % ophthalmic solution Place 1 drop into both eyes 2 (two) times daily.  4 08/29/2017 at Unknown time  . KLOR-CON M20 20 MEQ tablet Take 40 mEq by mouth daily.   0 08/29/2017 at Unknown time  . losartan (COZAAR) 25 MG tablet Take 25 mg by mouth daily.    08/29/2017 at Unknown time  . Magnesium Oxide (MAG-OXIDE) 200 MG TABS Take 2 tablets (400 mg total) by mouth 2 (two) times daily. 60 tablet 0   . mesalamine (APRISO) 0.375 G 24 hr capsule Take 1,500 mg by mouth daily.    08/29/2017 at Unknown time  . metFORMIN (GLUCOPHAGE-XR) 500 MG 24 hr tablet Take 500 mg by mouth 2 (two) times daily.    08/29/2017 at Unknown time  . metoprolol succinate (TOPROL-XL) 25 MG 24 hr tablet Take 1 tablet (25 mg total) by mouth daily. (Patient taking differently: Take 12.5 mg by mouth daily. ) 30 tablet 0 11/08/2017 at 0530  . ondansetron (ZOFRAN) 4 MG tablet Take 1 tablet (  4 mg total) by mouth every 8 (eight) hours as needed for nausea or vomiting. 8 tablet 0 prn at prn  . ondansetron (ZOFRAN) 4 MG tablet Take 1 tablet (4 mg total) by mouth every 6 (six) hours as needed for nausea. 20 tablet 0   . pantoprazole (PROTONIX) 40 MG tablet Take 40 mg by mouth every morning.   08/29/2017 at Unknown time  . pravastatin (PRAVACHOL) 10 MG tablet Take 10 mg by mouth at bedtime.   08/29/2017 at Unknown time     Allergies  Allergen Reactions  . Ampicillin Anaphylaxis  . Erythromycin Anaphylaxis  . Omeprazole Anaphylaxis  . Penicillins Anaphylaxis and Other (See Comments)    Has patient had a PCN reaction causing immediate rash, facial/tongue/throat  swelling, SOB or lightheadedness with hypotension: Yes Has patient had a PCN reaction causing severe rash involving mucus membranes or skin necrosis: No Has patient had a PCN reaction that required hospitalization: Unknown Has patient had a PCN reaction occurring within the last 10 years: Yes If all of the above answers are "NO", then may proceed with Cephalosporin use.   . Quinapril Cough  . Triamterene-Hctz Other (See Comments)     Past Medical History:  Diagnosis Date  . Anemia   . Arthritis   . Asthma   . COPD (chronic obstructive pulmonary disease) ([place])   . DDD (degenerative disc disease), lumbar   . Diabetes mellitus without complication ([place])    on metformin  . DJD (degenerative joint disease)   . Dysrhythmia   . Fatty liver   . Fibroids   . GERD (gastroesophageal reflux disease)   . Glaucoma   . Hyperlipidemia   . Hypertension   . IBS (irritable bowel syndrome)   . Lumbago   . Neuromuscular disorder ([place])   . 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 Renal disorder   . Ulcerative colitis ([place])    on mesalamine    Review of systems:      Physical Exam    Heart and lungs: Regular rate and rhythm without rub or gallop, lungs are bilaterally clear.    HEENT: Cephalic atraumatic eyes are anicteric    Other: At that    Pertinant exam for procedure: Soft nontender nondistended bowel sounds positive normoactive    Planned proceedures: ED and indicated procedures. I have discussed the risks benefits and complications of procedures to include not limited to bleeding, infection, perforation and the risk of sedation and the patient wishes to proceed.    Sheila Sails, MD Gastroenterology 11/08/2017  10:00 AM

## 2017-11-08 NOTE — Anesthesia Post-op Follow-up Note (Signed)
Anesthesia QCDR form completed.        

## 2017-11-08 NOTE — Anesthesia Preprocedure Evaluation (Addendum)
Anesthesia Evaluation  Patient identified by MRN, date of birth, ID band Patient awake    Reviewed: Allergy & Precautions, H&P , NPO status , Patient's Chart, lab work & pertinent test results  Airway Mallampati: III  TM Distance: >3 FB     Dental   Pulmonary asthma , COPD, former smoker,    breath sounds clear to auscultation       Cardiovascular hypertension, + dysrhythmias  Rhythm:regular Rate:Normal  Echo 2016: NORMAL LEFT VENTRICULAR SYSTOLIC FUNCTION NORMAL RIGHT VENTRICULAR SYSTOLIC FUNCTION TRIVIAL REGURGITATION NOTED NO VALVULAR STENOSIS Normal left ventricular function ejection fraction of greater than 55%   Neuro/Psych PSYCHIATRIC DISORDERS  Neuromuscular disease negative neurological ROS  negative psych ROS   GI/Hepatic PUD, GERD  ,(+)     substance abuse (has not used alcohol in 3 months)  alcohol use,   Endo/Other  diabetes  Renal/GU Renal disease  negative genitourinary   Musculoskeletal  (+) Arthritis ,   Abdominal   Peds  Hematology  (+) Blood dyscrasia, anemia ,   Anesthesia Other Findings Past Medical History: No date: Anemia No date: Arthritis No date: Asthma No date: COPD (chronic obstructive pulmonary disease) (HCC) No date: DDD (degenerative disc disease), lumbar No date: Diabetes mellitus without complication (HCC)     Comment:  on metformin No date: DJD (degenerative joint disease) No date: Dysrhythmia No date: Fatty liver No date: Fibroids No date: GERD (gastroesophageal reflux disease) No date: Glaucoma No date: Hyperlipidemia No date: Hypertension No date: IBS (irritable bowel syndrome) No date: Lumbago No date: Neuromuscular disorder ([place]) 05/2014: 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[employer]) No date: Renal disorder No date: Ulcerative colitis ([place])     Comment:  on mesalamine  Past Surgical History: 08/19/2015: COLONOSCOPY WITH PROPOFOL; N/A     Comment:  Procedure: COLONOSCOPY WITH  PROPOFOL;  Surgeon: Lollie Sails, MD;  Location: [facility] ENDOSCOPY;  Service:               Endoscopy;  Laterality: N/A; 12/08/2015: COLONOSCOPY WITH PROPOFOL; N/A     Comment:  Procedure: COLONOSCOPY WITH PROPOFOL;  Surgeon: Lollie Sails, MD;  Location: [facility] ENDOSCOPY;  Service:               Endoscopy;  Laterality: N/A; 12/09/2015: COLONOSCOPY WITH PROPOFOL; N/A     Comment:  Procedure: COLONOSCOPY WITH PROPOFOL;  Surgeon: Lollie Sails, MD;  Location: [facility] ENDOSCOPY;  Service:               Endoscopy;  Laterality: N/A; 04/19/2006: JOINT REPLACEMENT; Left 01/14/2015: JOINT REPLACEMENT; Right 2008: TOTAL KNEE ARTHROPLASTY; Left 01/14/2015: TOTAL KNEE ARTHROPLASTY; Right     Comment:  Procedure: TOTAL KNEE ARTHROPLASTY;  Surgeon: Hessie Knows, MD;  Location: [facility] ORS;  Service: Orthopedics;                Laterality: Right;  BMI    Body Mass Index:  28.33 kg/m      Reproductive/Obstetrics negative OB ROS                           Anesthesia Physical Anesthesia Plan  ASA: III  Anesthesia Plan: General   Post-op  Pain Management:    Induction:   PONV Risk Score and Plan: Propofol infusion and TIVA  Airway Management Planned: Natural Airway and Nasal Cannula  Additional Equipment:   Intra-op Plan:   Post-operative Plan:   Informed Consent: I have reviewed the patients History and Physical, chart, labs and discussed the procedure including the risks, benefits and alternatives for the proposed anesthesia with the patient or authorized representative who has indicated his/her understanding and acceptance.   Dental Advisory Given  Plan Discussed with: Anesthesiologist, CRNA and Surgeon  Anesthesia Plan Comments:         Anesthesia Quick Evaluation

## 2017-11-08 NOTE — Op Note (Signed)
[facility] Gastroenterology Patient Name: Sheila [name] Procedure Date: 11/08/2017 9:52 AM MRN: [id number] Account #: [id number] Date of Birth: May 29, 1960 Admit Type: Outpatient Age: 57 Room: [facility] ENDO ROOM 3 Gender: Female Note Status: Finalized Procedure:            Upper GI endoscopy Indications:          Epigastric abdominal pain, Gastro-esophageal reflux                        disease Providers:            Lollie Sails, MD Referring MD:         Remus Blake MD, MD (Referring MD) Medicines:            Monitored Anesthesia Care Complications:        No immediate complications. Procedure:            Pre-Anesthesia Assessment:                       - ASA Grade Assessment: III - A patient with severe                        systemic disease.                       After obtaining informed consent, the endoscope was                        passed under direct vision. Throughout the procedure,                        the patient's blood pressure, pulse, and oxygen                        saturations were monitored continuously. The Endoscope                        was introduced through the mouth, and advanced to the                        third part of duodenum. The upper GI endoscopy was                        accomplished without difficulty. The patient tolerated                        the procedure well. Findings:      The Z-line was regular. Biopsies were taken with a cold forceps for       histology.      The exam of the esophagus was otherwise normal.      Patchy minimal inflammation characterized by erythema was found in the       gastric antrum. Biopsies were taken with a cold forceps for histology       from the antrum and body.      A small hiatal hernia was found. The Z-line was a variable distance from       incisors; the hiatal hernia was sliding.      Diffuse and patchy mild mucosal variance characterized by altered       texture was found  in the entire duodenum. Biopsies were  taken with a       cold forceps for histology.      The cardia and gastric fundus were normal on retroflexion otherwise. Impression:           - Z-line regular. Biopsied.                       - Gastritis. Biopsied.                       - Small hiatal hernia.                       - Mucosal variant in the duodenum. Biopsied. Recommendation:       - Continue present medications.                       - Perform a RUQ ultrasound at appointment to be                        scheduled.                       - Perform a hepatobiliary scan and CCK study at                        appointment to be scheduled.                       - Return to GI clinic in 3 weeks. Procedure Code(s):    --- Professional ---                       [phone], Esophagogastroduodenoscopy, flexible, transoral;                        with biopsy, single or multiple Diagnosis Code(s):    --- Professional ---                       K29.70, Gastritis, unspecified, without bleeding                       K44.9, Diaphragmatic hernia without obstruction or                        gangrene                       K31.89, Other diseases of stomach and duodenum                       R10.13, Epigastric pain                       K21.9, Gastro-esophageal reflux disease without                        esophagitis CPT copyright 2018 American Medical Association. All rights reserved. The codes documented in this report are preliminary and upon coder review may  be revised to meet current compliance requirements. Lollie Sails, MD 11/08/2017 10:23:06 AM This report has been signed electronically. Number of Addenda: 0 Note Initiated On: 11/08/2017 9:52 AM      [facility]

## 2017-11-08 NOTE — Anesthesia Procedure Notes (Signed)
Date/Time: 11/08/2017 10:15 AM Performed by: Allean Found, CRNA Pre-anesthesia Checklist: Patient identified, Emergency Drugs available, Suction available, Patient being monitored and Timeout performed Patient Re-evaluated:Patient Re-evaluated prior to induction Oxygen Delivery Method: Nasal cannula Placement Confirmation: CO2 detector

## 2017-11-09 ENCOUNTER — Other Ambulatory Visit: Payer: Self-pay | Admitting: Gastroenterology

## 2017-11-09 DIAGNOSIS — R1011 Right upper quadrant pain: Secondary | ICD-10-CM

## 2017-11-09 DIAGNOSIS — R1013 Epigastric pain: Secondary | ICD-10-CM

## 2017-11-10 ENCOUNTER — Encounter: Payer: Self-pay | Admitting: Gastroenterology

## 2017-11-10 LAB — SURGICAL PATHOLOGY

## 2017-11-18 ENCOUNTER — Ambulatory Visit
Admission: RE | Admit: 2017-11-18 | Discharge: 2017-11-18 | Disposition: A | Discharge status: Home / Self Care | Payer: Medicaid Other | Source: Ambulatory Visit | Attending: Gastroenterology | Admitting: Gastroenterology

## 2017-11-18 DIAGNOSIS — R1011 Right upper quadrant pain: Secondary | ICD-10-CM | POA: Diagnosis not present

## 2017-11-18 DIAGNOSIS — K769 Liver disease, unspecified: Secondary | ICD-10-CM | POA: Insufficient documentation

## 2017-11-18 DIAGNOSIS — R1013 Epigastric pain: Secondary | ICD-10-CM

## 2017-11-21 ENCOUNTER — Other Ambulatory Visit: Payer: Self-pay | Admitting: Gastroenterology

## 2017-11-21 DIAGNOSIS — R932 Abnormal findings on diagnostic imaging of liver and biliary tract: Secondary | ICD-10-CM

## 2017-11-24 ENCOUNTER — Encounter
Admission: RE | Admit: 2017-11-24 | Discharge: 2017-11-24 | Disposition: A | Discharge status: Home / Self Care | Payer: Medicaid Other | Source: Ambulatory Visit | Attending: Gastroenterology | Admitting: Gastroenterology

## 2017-11-24 DIAGNOSIS — R1013 Epigastric pain: Secondary | ICD-10-CM | POA: Diagnosis present

## 2017-11-24 DIAGNOSIS — R1011 Right upper quadrant pain: Secondary | ICD-10-CM | POA: Insufficient documentation

## 2017-11-24 MED ORDER — TECHNETIUM TC 99M MEBROFENIN IV KIT
5.0000 | PACK | Freq: Once | INTRAVENOUS | Status: AC | PRN
  Administered 2017-11-24: 5.36 via INTRAVENOUS

## 2017-12-19 ENCOUNTER — Ambulatory Visit: Payer: Medicaid Other

## 2018-01-09 ENCOUNTER — Ambulatory Visit
Admission: RE | Admit: 2018-01-09 | Discharge: 2018-01-09 | Disposition: A | Discharge status: Home / Self Care | Payer: Medicaid Other | Source: Ambulatory Visit | Attending: Gastroenterology | Admitting: Gastroenterology

## 2018-01-09 DIAGNOSIS — R932 Abnormal findings on diagnostic imaging of liver and biliary tract: Secondary | ICD-10-CM | POA: Insufficient documentation

## 2018-01-09 LAB — POCT I-STAT CREATININE: Creatinine, Ser: 0.6 mg/dL (ref 0.44–1.00)

## 2018-01-09 MED ORDER — GADOBUTROL 1 MMOL/ML IV SOLN
6.5000 mL | Freq: Once | INTRAVENOUS | Status: AC | PRN
  Administered 2018-01-09: 6.5 mL via INTRAVENOUS

## 2018-01-29 ENCOUNTER — Emergency Department: Payer: Medicaid Other

## 2018-01-29 ENCOUNTER — Encounter: Payer: Self-pay | Admitting: Radiology

## 2018-01-29 ENCOUNTER — Inpatient Hospital Stay
Admission: EM | Admit: 2018-01-29 | Discharge: 2018-01-31 | DRG: 392 | Disposition: A | Discharge status: Home / Self Care | Payer: Medicaid Other | Attending: Internal Medicine | Admitting: Internal Medicine

## 2018-01-29 ENCOUNTER — Other Ambulatory Visit: Payer: Self-pay

## 2018-01-29 DIAGNOSIS — R1013 Epigastric pain: Secondary | ICD-10-CM

## 2018-01-29 DIAGNOSIS — D72829 Elevated white blood cell count, unspecified: Secondary | ICD-10-CM | POA: Diagnosis present

## 2018-01-29 DIAGNOSIS — H409 Unspecified glaucoma: Secondary | ICD-10-CM | POA: Diagnosis present

## 2018-01-29 DIAGNOSIS — R6510 Systemic inflammatory response syndrome (SIRS) of non-infectious origin without acute organ dysfunction: Secondary | ICD-10-CM | POA: Diagnosis present

## 2018-01-29 DIAGNOSIS — E669 Obesity, unspecified: Secondary | ICD-10-CM | POA: Diagnosis present

## 2018-01-29 DIAGNOSIS — Z8249 Family history of ischemic heart disease and other diseases of the circulatory system: Secondary | ICD-10-CM

## 2018-01-29 DIAGNOSIS — E162 Hypoglycemia, unspecified: Secondary | ICD-10-CM | POA: Diagnosis present

## 2018-01-29 DIAGNOSIS — G8929 Other chronic pain: Secondary | ICD-10-CM

## 2018-01-29 DIAGNOSIS — F10229 Alcohol dependence with intoxication, unspecified: Secondary | ICD-10-CM | POA: Diagnosis present

## 2018-01-29 DIAGNOSIS — E872 Acidosis, unspecified: Secondary | ICD-10-CM | POA: Diagnosis present

## 2018-01-29 DIAGNOSIS — I10 Essential (primary) hypertension: Secondary | ICD-10-CM | POA: Diagnosis present

## 2018-01-29 DIAGNOSIS — R51 Headache: Secondary | ICD-10-CM | POA: Diagnosis present

## 2018-01-29 DIAGNOSIS — F1010 Alcohol abuse, uncomplicated: Secondary | ICD-10-CM

## 2018-01-29 DIAGNOSIS — R0789 Other chest pain: Secondary | ICD-10-CM | POA: Diagnosis present

## 2018-01-29 DIAGNOSIS — Z6833 Body mass index (BMI) 33.0-33.9, adult: Secondary | ICD-10-CM

## 2018-01-29 DIAGNOSIS — R410 Disorientation, unspecified: Secondary | ICD-10-CM | POA: Diagnosis present

## 2018-01-29 DIAGNOSIS — G9340 Encephalopathy, unspecified: Secondary | ICD-10-CM

## 2018-01-29 DIAGNOSIS — K769 Liver disease, unspecified: Secondary | ICD-10-CM | POA: Diagnosis present

## 2018-01-29 DIAGNOSIS — K5730 Diverticulosis of large intestine without perforation or abscess without bleeding: Secondary | ICD-10-CM | POA: Diagnosis present

## 2018-01-29 DIAGNOSIS — Z96653 Presence of artificial knee joint, bilateral: Secondary | ICD-10-CM | POA: Diagnosis present

## 2018-01-29 DIAGNOSIS — R1011 Right upper quadrant pain: Principal | ICD-10-CM | POA: Diagnosis present

## 2018-01-29 DIAGNOSIS — Z833 Family history of diabetes mellitus: Secondary | ICD-10-CM

## 2018-01-29 DIAGNOSIS — E11649 Type 2 diabetes mellitus with hypoglycemia without coma: Secondary | ICD-10-CM | POA: Diagnosis present

## 2018-01-29 DIAGNOSIS — F1090 Alcohol use, unspecified, uncomplicated: Secondary | ICD-10-CM | POA: Diagnosis present

## 2018-01-29 DIAGNOSIS — Z79899 Other long term (current) drug therapy: Secondary | ICD-10-CM

## 2018-01-29 DIAGNOSIS — E876 Hypokalemia: Secondary | ICD-10-CM | POA: Diagnosis present

## 2018-01-29 DIAGNOSIS — E119 Type 2 diabetes mellitus without complications: Secondary | ICD-10-CM

## 2018-01-29 DIAGNOSIS — Z888 Allergy status to other drugs, medicaments and biological substances status: Secondary | ICD-10-CM

## 2018-01-29 DIAGNOSIS — Z87891 Personal history of nicotine dependence: Secondary | ICD-10-CM

## 2018-01-29 DIAGNOSIS — J449 Chronic obstructive pulmonary disease, unspecified: Secondary | ICD-10-CM | POA: Diagnosis present

## 2018-01-29 DIAGNOSIS — A419 Sepsis, unspecified organism: Secondary | ICD-10-CM

## 2018-01-29 DIAGNOSIS — K819 Cholecystitis, unspecified: Secondary | ICD-10-CM

## 2018-01-29 DIAGNOSIS — Z880 Allergy status to penicillin: Secondary | ICD-10-CM

## 2018-01-29 DIAGNOSIS — R6520 Severe sepsis without septic shock: Secondary | ICD-10-CM

## 2018-01-29 DIAGNOSIS — Z7984 Long term (current) use of oral hypoglycemic drugs: Secondary | ICD-10-CM

## 2018-01-29 DIAGNOSIS — Z881 Allergy status to other antibiotic agents status: Secondary | ICD-10-CM

## 2018-01-29 DIAGNOSIS — K5190 Ulcerative colitis, unspecified, without complications: Secondary | ICD-10-CM | POA: Diagnosis present

## 2018-01-29 DIAGNOSIS — K219 Gastro-esophageal reflux disease without esophagitis: Secondary | ICD-10-CM | POA: Diagnosis present

## 2018-01-29 DIAGNOSIS — E785 Hyperlipidemia, unspecified: Secondary | ICD-10-CM | POA: Diagnosis present

## 2018-01-29 LAB — COMPREHENSIVE METABOLIC PANEL
ALT: 51 U/L — ABNORMAL HIGH (ref 0–44)
AST: 70 U/L — ABNORMAL HIGH (ref 15–41)
Albumin: 4.7 g/dL (ref 3.5–5.0)
Alkaline Phosphatase: 88 U/L (ref 38–126)
Anion gap: 37 — ABNORMAL HIGH (ref 5–15)
BUN: 16 mg/dL (ref 6–20)
CO2: 7 mmol/L — ABNORMAL LOW (ref 22–32)
Calcium: 8.7 mg/dL — ABNORMAL LOW (ref 8.9–10.3)
Chloride: 95 mmol/L — ABNORMAL LOW (ref 98–111)
Creatinine, Ser: 0.75 mg/dL (ref 0.44–1.00)
GFR calc Af Amer: >60 mL/min (ref >60)
GFR calc non Af Amer: >60 mL/min (ref >60)
Glucose, Bld: <20 mg/dL — CL (ref 70–99)
POTASSIUM: 2 mmol/L — AB (ref 3.5–5.1)
Sodium: 139 mmol/L (ref 135–145)
Total Bilirubin: 1 mg/dL (ref 0.3–1.2)
Total Protein: 8.9 g/dL — ABNORMAL HIGH (ref 6.5–8.1)

## 2018-01-29 LAB — URINALYSIS, COMPLETE (UACMP) WITH MICROSCOPIC
Bilirubin Urine: NEGATIVE
Glucose, UA: NEGATIVE mg/dL
Ketones, ur: 20 mg/dL — AB
Leukocytes, UA: NEGATIVE
Nitrite: NEGATIVE
Protein, ur: 100 mg/dL — AB
Specific Gravity, Urine: 1.013 (ref 1.005–1.030)
pH: 5 (ref 5.0–8.0)

## 2018-01-29 LAB — CBC WITH DIFFERENTIAL/PLATELET
Abs Immature Granulocytes: 0.32 10*3/uL — ABNORMAL HIGH (ref 0.00–0.07)
Basophils Absolute: 0.2 10*3/uL — ABNORMAL HIGH (ref 0.0–0.1)
Basophils Relative: 1 %
EOS ABS: 0.1 10*3/uL (ref 0.0–0.5)
Eosinophils Relative: 1 %
HCT: 47.3 % — ABNORMAL HIGH (ref 36.0–46.0)
Hemoglobin: 14.6 g/dL (ref 12.0–15.0)
Immature Granulocytes: 1 %
Lymphocytes Relative: 40 %
Lymphs Abs: 10.6 10*3/uL — ABNORMAL HIGH (ref 0.7–4.0)
MCH: 26.4 pg (ref 26.0–34.0)
MCHC: 30.9 g/dL (ref 30.0–36.0)
MCV: 85.5 fL (ref 80.0–100.0)
Monocytes Absolute: 1.4 10*3/uL — ABNORMAL HIGH (ref 0.1–1.0)
Monocytes Relative: 5 %
Neutro Abs: 14.1 10*3/uL — ABNORMAL HIGH (ref 1.7–7.7)
Neutrophils Relative %: 52 %
Platelets: 341 10*3/uL (ref 150–400)
RBC: 5.53 MIL/uL — ABNORMAL HIGH (ref 3.87–5.11)
RDW: 17.2 % — ABNORMAL HIGH (ref 11.5–15.5)
Smear Review: NORMAL
WBC: 26.6 10*3/uL — AB (ref 4.0–10.5)
nRBC: 0.1 % (ref 0.0–0.2)

## 2018-01-29 LAB — INFLUENZA PANEL BY PCR (TYPE A & B)
Influenza A By PCR: NEGATIVE
Influenza B By PCR: NEGATIVE

## 2018-01-29 LAB — GLUCOSE, CAPILLARY
Glucose-Capillary: 152 mg/dL — ABNORMAL HIGH (ref 70–99)
Glucose-Capillary: 223 mg/dL — ABNORMAL HIGH (ref 70–99)

## 2018-01-29 LAB — LIPASE, BLOOD: LIPASE: 41 U/L (ref 11–51)

## 2018-01-29 LAB — ETHANOL: Alcohol, Ethyl (B): 89 mg/dL — ABNORMAL HIGH (ref <10)

## 2018-01-29 LAB — TROPONIN I: Troponin I: <0.03 ng/mL (ref <0.03)

## 2018-01-29 LAB — LACTIC ACID, PLASMA: Lactic Acid, Venous: >11 mmol/L (ref 0.5–1.9)

## 2018-01-29 MED ORDER — ONDANSETRON HCL 4 MG/2ML IJ SOLN
4.0000 mg | Freq: Once | INTRAMUSCULAR | Status: AC
  Administered 2018-01-29: 4 mg via INTRAVENOUS
  Filled 2018-01-29: qty 2

## 2018-01-29 MED ORDER — DEXTROSE 50 % IV SOLN
1.0000 | Freq: Once | INTRAVENOUS | Status: AC
  Administered 2018-01-29: 50 mL via INTRAVENOUS
  Filled 2018-01-29: qty 50

## 2018-01-29 MED ORDER — METHYLPREDNISOLONE SODIUM SUCC 125 MG IJ SOLR
125.0000 mg | Freq: Once | INTRAMUSCULAR | Status: AC
  Administered 2018-01-29: 125 mg via INTRAVENOUS
  Filled 2018-01-29: qty 2

## 2018-01-29 MED ORDER — SODIUM CHLORIDE 0.9 % IV BOLUS
1000.0000 mL | Freq: Once | INTRAVENOUS | Status: AC
  Administered 2018-01-29: 1000 mL via INTRAVENOUS

## 2018-01-29 MED ORDER — SODIUM CHLORIDE 0.9 % IV SOLN
1.0000 g | Freq: Once | INTRAVENOUS | Status: AC
  Administered 2018-01-30: 1 g via INTRAVENOUS
  Filled 2018-01-29: qty 1

## 2018-01-29 MED ORDER — SODIUM CHLORIDE 0.9 % IV BOLUS
1000.0000 mL | Freq: Once | INTRAVENOUS | Status: AC
  Administered 2018-01-30: 1000 mL via INTRAVENOUS

## 2018-01-29 MED ORDER — DEXTROSE-NACL 5-0.9 % IV SOLN
INTRAVENOUS | Status: DC
  Filled 2018-01-29: qty 1000

## 2018-01-29 MED ORDER — VANCOMYCIN HCL IN DEXTROSE 1-5 GM/200ML-% IV SOLN
1000.0000 mg | Freq: Once | INTRAVENOUS | Status: AC
  Administered 2018-01-30: 1000 mg via INTRAVENOUS
  Filled 2018-01-29: qty 200

## 2018-01-29 MED ORDER — POTASSIUM CHLORIDE 10 MEQ/100ML IV SOLN
10.0000 meq | INTRAVENOUS | Status: AC
  Administered 2018-01-29 – 2018-01-30 (×4): 10 meq via INTRAVENOUS
  Filled 2018-01-29 (×5): qty 100

## 2018-01-29 MED ORDER — SODIUM CHLORIDE 0.9 % IV BOLUS: 1000.0000 mL | Freq: Once | INTRAVENOUS | Status: DC

## 2018-01-29 MED ORDER — MAGNESIUM SULFATE 2 GM/50ML IV SOLN
2.0000 g | Freq: Once | INTRAVENOUS | Status: AC
  Administered 2018-01-29: 2 g via INTRAVENOUS
  Filled 2018-01-29: qty 50

## 2018-01-29 MED ORDER — IOPAMIDOL (ISOVUE-300) INJECTION 61%
100.0000 mL | Freq: Once | INTRAVENOUS | Status: AC | PRN
  Administered 2018-01-29: 100 mL via INTRAVENOUS

## 2018-01-29 MED ORDER — THIAMINE HCL 100 MG/ML IJ SOLN
100.0000 mg | Freq: Once | INTRAMUSCULAR | Status: AC
  Administered 2018-01-29: 100 mg via INTRAVENOUS
  Filled 2018-01-29: qty 2

## 2018-01-29 MED ORDER — IPRATROPIUM-ALBUTEROL 0.5-2.5 (3) MG/3ML IN SOLN
3.0000 mL | Freq: Once | RESPIRATORY_TRACT | Status: AC
  Administered 2018-01-29: 3 mL via RESPIRATORY_TRACT
  Filled 2018-01-29: qty 3

## 2018-01-29 NOTE — ED Notes (Signed)
Report given to Michelle M. RN

## 2018-01-29 NOTE — ED Notes (Signed)
Patient wakes easily to voice, voice is slurred, but answers appropriately. Patient does not maintain alertness. Family at bedside.

## 2018-01-29 NOTE — ED Notes (Signed)
Patient transported to CT 

## 2018-01-29 NOTE — ED Provider Notes (Signed)
[facility] Emergency Department Provider Note  ____________________________________________  Time seen: Approximately 11:55 PM  I have reviewed the triage vital signs and the nursing notes.   HISTORY  Chief Complaint Chest Pain  Level 5 caveat:  Portions of the history and physical were unable to be obtained due to AMS   HPI Sheila [name] is a 58 y.o. female with a history of alcohol abuse, hypertension, hyperlipidemia, COPD, chronic pancreatitis who presents for evaluation of chest pain.  History is gathered mostly from the daughter.  Patient called her this morning saying that she did not feel well.  She was nauseated.  She was complaining of burning sensation in her chest and epigastric abdominal pain.  She had one episode of vomiting.  Patient also endorses cough.  No fever or chills.  She reports that her chest pain is currently resolved.  She continues to drink.  No shortness of breath.  Story is limited at this time due to altered mental status  Past Medical History:  Diagnosis Date  . Anemia   . Arthritis   . Asthma   . COPD (chronic obstructive pulmonary disease) ([place])   . DDD (degenerative disc disease), lumbar   . Diabetes mellitus without complication ([place])    on metformin  . DJD (degenerative joint disease)   . Dysrhythmia   . Fatty liver   . Fibroids   . GERD (gastroesophageal reflux disease)   . Glaucoma   . Hyperlipidemia   . Hypertension   . IBS (irritable bowel syndrome)   . Lumbago   . Neuromuscular disorder ([place])   . 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[place]) 96/2229  . Renal disorder   . Ulcerative colitis ([place])    on mesalamine    Patient Active Problem List   Diagnosis Date Noted  . Pancreatitis 08/29/2017  . Diabetes ([place]) 08/29/2017  . HTN (hypertension) 08/29/2017  . HLD (hyperlipidemia) 08/29/2017  . GERD (gastroesophageal reflux disease) 08/29/2017  . Hypokalemia 08/29/2017  . Hypocalcemia 08/29/2017  . Primary  osteoarthritis of right knee 01/14/2015    Past Surgical History:  Procedure Laterality Date  . COLONOSCOPY WITH PROPOFOL N/A 08/19/2015   Procedure: COLONOSCOPY WITH PROPOFOL;  Surgeon: Lollie Sails, MD;  Location: [facility] ENDOSCOPY;  Service: Endoscopy;  Laterality: N/A;  . COLONOSCOPY WITH PROPOFOL N/A 12/08/2015   Procedure: COLONOSCOPY WITH PROPOFOL;  Surgeon: Lollie Sails, MD;  Location: [facility] ENDOSCOPY;  Service: Endoscopy;  Laterality: N/A;  . COLONOSCOPY WITH PROPOFOL N/A 12/09/2015   Procedure: COLONOSCOPY WITH PROPOFOL;  Surgeon: Lollie Sails, MD;  Location: [facility] ENDOSCOPY;  Service: Endoscopy;  Laterality: N/A;  . ESOPHAGOGASTRODUODENOSCOPY (EGD) WITH PROPOFOL N/A 11/08/2017   Procedure: ESOPHAGOGASTRODUODENOSCOPY (EGD) WITH PROPOFOL;  Surgeon: Lollie Sails, MD;  Location: [facility] ENDOSCOPY;  Service: Endoscopy;  Laterality: N/A;  . JOINT REPLACEMENT Left 04/19/2006  . JOINT REPLACEMENT Right 01/14/2015  . TOTAL KNEE ARTHROPLASTY Left 2008  . TOTAL KNEE ARTHROPLASTY Right 01/14/2015   Procedure: TOTAL KNEE ARTHROPLASTY;  Surgeon: Hessie Knows, MD;  Location: [facility] ORS;  Service: Orthopedics;  Laterality: Right;    Prior to Admission medications   Medication Sig Start Date End Date Taking? Authorizing Provider  albuterol (PROVENTIL HFA;VENTOLIN HFA) 108 (90 BASE) MCG/ACT inhaler Inhale 2 puffs into the lungs every 4 (four) hours as needed for wheezing or shortness of breath.   Yes [provider]  chlorthalidone (HYGROTON) 25 MG tablet Take 25 mg by mouth daily.  01/04/15  Yes [provider]  [employer] VITAMIN B12 1000 MCG tablet Take 1,000 mcg by mouth daily. 07/20/17  Yes [provider]  dorzolamide (TRUSOPT) 2 % ophthalmic solution Place 1 drop into both eyes 2 (two) times daily. 07/17/17  Yes [provider]  KLOR-CON M20 20 MEQ tablet Take 40 mEq by mouth daily.  01/01/15  Yes [provider]  latanoprost (XALATAN) 0.005 %  ophthalmic solution Place 1 drop into both eyes at bedtime. 01/01/18  Yes [provider]  losartan (COZAAR) 25 MG tablet Take 25 mg by mouth daily.    Yes [provider]  mesalamine (APRISO) 0.375 G 24 hr capsule Take 1,500 mg by mouth daily.    Yes [provider]  metFORMIN (GLUCOPHAGE-XR) 500 MG 24 hr tablet Take 500 mg by mouth 2 (two) times daily.    Yes [provider]  metoprolol succinate (TOPROL-XL) 50 MG 24 hr tablet Take 50 mg by mouth at bedtime. 01/17/18  Yes [provider]  pantoprazole (PROTONIX) 40 MG tablet Take 40 mg by mouth every morning.   Yes [provider]  pravastatin (PRAVACHOL) 10 MG tablet Take 10 mg by mouth at bedtime.   Yes [provider]  sucralfate (CARAFATE) 1 g tablet Take 1 g by mouth 2 (two) times daily.   Yes [provider]  allopurinol (ZYLOPRIM) 100 MG tablet Take 100 mg by mouth daily. 08/04/17   [provider]  bimatoprost (LUMIGAN) 0.03 % ophthalmic solution Place 1 drop into both eyes at bedtime.    [provider]  colchicine 0.6 MG tablet Take 0.6 mg by mouth daily.    [provider]  gabapentin (NEURONTIN) 300 MG capsule Take 300 mg by mouth 3 (three) times daily.    [provider]  Magnesium Oxide (MAG-OXIDE) 200 MG TABS Take 2 tablets (400 mg total) by mouth 2 (two) times daily. 09/01/17   Dustin Flock, MD  metoprolol succinate (TOPROL-XL) 25 MG 24 hr tablet Take 1 tablet (25 mg total) by mouth daily. Patient not taking: Reported on 01/29/2018 01/17/15   Duanne Guess, PA-C  ondansetron (ZOFRAN) 4 MG tablet Take 1 tablet (4 mg total) by mouth every 8 (eight) hours as needed for nausea or vomiting. 08/28/17   Schuyler Amor, MD  ondansetron (ZOFRAN) 4 MG tablet Take 1 tablet (4 mg total) by mouth every 6 (six) hours as needed for nausea. 09/01/17   Dustin Flock, MD    Allergies Ampicillin; Erythromycin; Omeprazole; Penicillins;  Quinapril; and Triamterene-hctz  Family History  Problem Relation Age of Onset  . Hypertension Mother   . Diabetes Father   . Hypertension Father   . Diabetes Brother   . Hypertension Brother   . Breast cancer Neg Hx     Social History Social History   Tobacco Use  . Smoking status: Former Smoker    Last attempt to quit: 01/04/1978    Years since quitting: 40.0  . Smokeless tobacco: Never Used  Substance Use Topics  . Alcohol use: Yes    Comment: Recovering alcoholic since 81/1914  . Drug use: No    Comment: used cocaine, quit in 2003    Review of Systems  Constitutional: Negative for fever. + confusion Cardiovascular: + chest pain. Respiratory: Negative for shortness of breath. Gastrointestinal: Negative for abdominal pain, diarrhea. + N/V Genitourinary: Negative for dysuria. Musculoskeletal: Negative for back pain. Skin: Negative for rash. Neurological: Negative for headaches, weakness or numbness. Psych: No SI or HI  ____________________________________________  PHYSICAL EXAM:  VITAL SIGNS: ED Triage Vitals  Enc Vitals Group     BP 01/29/18 2021 (!) 139/59     Pulse Rate 01/29/18 2021 94     Resp 01/29/18 2021 18     Temp --      Temp Source 01/29/18 2020 Oral     SpO2 01/29/18 2021 92 %     Weight 01/29/18 2017 167 lb (75.8 kg)     Height 01/29/18 2017 5' (1.524 m)     Head Circumference --      Peak Flow --      Pain Score 01/29/18 2017 0     Pain Loc --      Pain Edu? --      Excl. in [place]? --     Constitutional: Sleepy, easily arousable, poor historian, no distress  HEENT:      Head: Normocephalic and atraumatic.         Eyes: Conjunctivae are normal. Sclera is non-icteric.       Mouth/Throat: Mucous membranes are dry.       Neck: Supple with no signs of meningismus. Cardiovascular: Regular rate and rhythm. No murmurs, gallops, or rubs. 2+ symmetrical distal pulses are present in all extremities. No JVD. Respiratory: Normal respiratory effort.  Lungs are clear to auscultation bilaterally. No wheezes, crackles, or rhonchi.  Gastrointestinal: Soft, non tender, and non distended with positive bowel sounds. No rebound or guarding. Musculoskeletal: Nontender with normal range of motion in all extremities. No edema, cyanosis, or erythema of extremities. Neurologic: Normal speech and language. Face is symmetric. Moving all extremities. No gross focal neurologic deficits are appreciated. Skin: Skin is warm, dry and intact. No rash noted. Psychiatric: Mood and affect are normal. Speech and behavior are normal.  ____________________________________________   LABS (all labs ordered are listed, but only abnormal results are displayed)  Labs Reviewed  COMPREHENSIVE METABOLIC PANEL - Abnormal; Notable for the following components:      Result Value   Potassium 2.0 (*)    Chloride 95 (*)    CO2 7 (*)    Glucose, Bld <20 (*)    Calcium 8.7 (*)    Total Protein 8.9 (*)    AST 70 (*)    ALT 51 (*)    Anion gap 37 (*)    All other components within normal limits  CBC WITH DIFFERENTIAL/PLATELET - Abnormal; Notable for the following components:   WBC 26.6 (*)    RBC 5.53 (*)    HCT 47.3 (*)    RDW 17.2 (*)    Neutro Abs 14.1 (*)    Lymphs Abs 10.6 (*)    Monocytes Absolute 1.4 (*)    Basophils Absolute 0.2 (*)    Abs Immature Granulocytes 0.32 (*)    All other components within normal limits  ETHANOL - Abnormal; Notable for the following components:   Alcohol, Ethyl (B) 89 (*)    All other components within normal limits  LACTIC ACID, PLASMA - Abnormal; Notable for the following components:   Lactic Acid, Venous >11.0 (*)    All other components within normal limits  GLUCOSE, CAPILLARY - Abnormal; Notable for the following components:   Glucose-Capillary 223 (*)    All other components within normal limits  URINALYSIS, COMPLETE (UACMP) WITH MICROSCOPIC - Abnormal; Notable for the following components:   Color, Urine YELLOW (*)     APPearance CLEAR (*)    Hgb urine dipstick SMALL (*)    Ketones, ur  20 (*)    Protein, ur 100 (*)    Bacteria, UA RARE (*)    All other components within normal limits  GLUCOSE, CAPILLARY - Abnormal; Notable for the following components:   Glucose-Capillary 152 (*)    All other components within normal limits  CULTURE, BLOOD (ROUTINE X 2)  CULTURE, BLOOD (ROUTINE X 2)  TROPONIN I  LIPASE, BLOOD  INFLUENZA PANEL BY PCR (TYPE A & B)  PATHOLOGIST SMEAR REVIEW  CBG MONITORING, ED   ____________________________________________  EKG  ED ECG REPORT I, Rudene Re, the attending physician, personally viewed and interpreted this ECG.  Normal sinus rhythm, rate of 95, prolonged QTC, normal axis, diffuse ST depressions in inferior and lateral leads with no ST elevation.  ST depressions are not new but worse when compared to prior ____________________________________________  RADIOLOGY  I have personally reviewed the images performed during this visit and I agree with the Radiologist's read.   Interpretation by Radiologist:  Dg Chest 2 View  Result Date: 01/29/2018 CLINICAL DATA:  Chest pain and nausea EXAM: CHEST - 2 VIEW COMPARISON:  07/11/2016 FINDINGS: Cardiac shadows within normal limits. The lungs are well aerated bilaterally. No focal infiltrate or sizable effusion is seen. Degenerative changes of the thoracic spine are noted. Old rib fractures on the left are noted. IMPRESSION: No acute abnormality noted. Electronically Signed   By: Inez Catalina M.D.   On: 01/29/2018 20:59     ____________________________________________   PROCEDURES  Procedure(s) performed: None Procedures Critical Care performed: yes  CRITICAL CARE Performed by: Rudene Re  ?  Total critical care time: 40 min  Critical care time was exclusive of separately billable procedures and treating other patients.  Critical care was necessary to treat or prevent imminent or life-threatening  deterioration.  Critical care was time spent personally by me on the following activities: development of treatment plan with patient and/or surrogate as well as nursing, discussions with consultants, evaluation of patient's response to treatment, examination of patient, obtaining history from patient or surrogate, ordering and performing treatments and interventions, ordering and review of laboratory studies, ordering and review of radiographic studies, pulse oximetry and re-evaluation of patient's condition.  ____________________________________________   INITIAL IMPRESSION / ASSESSMENT AND PLAN / ED COURSE   58 y.o. female with a history of alcohol abuse, hypertension, hyperlipidemia, COPD, chronic pancreatitis who presents for evaluation of chest pain, nausea, vomiting since this am. Continues to drink daily.  Patient is sleepy but easily arousable, confused and unable to provide a lot of history.  EKG showing worsening diffuse ST depressions.  Lungs are clear to auscultation, abdomen soft and non tender. Labs showing severe hypokalemia with prolonged QTC.  Patient was started supplementation IV.  Patient also found to be hypoglycemic for which she was given D50 amp.  Patient looks dry on exam and she was started on IV fluids. Patient with anion gap of 37, white count of 26 and a lactic of greater than 11.  Unknown source of sepsis. UA negative.  Flu negative.  Chest x-ray negative.  CT abdomen pelvis pending.  In the meantime we will cover broadly with cefepime and vancomycin. Care discussed with Dr. Jannifer Franklin for admission       As part of my medical decision making, I reviewed the following data within the [place] History obtained from family, Nursing notes reviewed and incorporated, Labs reviewed , EKG interpreted , Old EKG reviewed, Old chart reviewed, Radiograph reviewed , Discussed with admitting physician ,  Notes from prior ED visits and [place] Controlled Substance  Database    Pertinent labs & imaging results that were available during my care of the patient were reviewed by me and considered in my medical decision making (see chart for details).    ____________________________________________   FINAL CLINICAL IMPRESSION(S) / ED DIAGNOSES  Final diagnoses:  Alcohol abuse  Hypokalemia  Hypoglycemia  Sepsis with encephalopathy without septic shock, due to unspecified organism [facility])      NEW MEDICATIONS STARTED DURING THIS VISIT:  ED Discharge Orders    None       Note:  This document was prepared using Dragon voice recognition software and may include unintentional dictation errors.    Alfred Levins, [place], MD 01/30/18 0001

## 2018-01-29 NOTE — ED Notes (Addendum)
Dr. Alfred Levins aware of Lactic of over 11.

## 2018-01-29 NOTE — ED Notes (Signed)
Patient is lying on stretcher with eyes closed. Patient awakens to voice and answers questions appropriately. Family at bedside.

## 2018-01-29 NOTE — ED Notes (Signed)
Dr. Alfred Levins aware of glucose less than 20 and potassium of 2.0.

## 2018-01-29 NOTE — ED Triage Notes (Signed)
Reports nauseated all day.  This evening with chest pain, heart racing and short of breath.

## 2018-01-30 ENCOUNTER — Inpatient Hospital Stay: Payer: Medicaid Other

## 2018-01-30 DIAGNOSIS — F1010 Alcohol abuse, uncomplicated: Secondary | ICD-10-CM | POA: Diagnosis not present

## 2018-01-30 DIAGNOSIS — E162 Hypoglycemia, unspecified: Secondary | ICD-10-CM | POA: Diagnosis present

## 2018-01-30 DIAGNOSIS — H409 Unspecified glaucoma: Secondary | ICD-10-CM | POA: Diagnosis present

## 2018-01-30 DIAGNOSIS — R1011 Right upper quadrant pain: Secondary | ICD-10-CM | POA: Diagnosis present

## 2018-01-30 DIAGNOSIS — F10229 Alcohol dependence with intoxication, unspecified: Secondary | ICD-10-CM | POA: Diagnosis present

## 2018-01-30 DIAGNOSIS — F1090 Alcohol use, unspecified, uncomplicated: Secondary | ICD-10-CM | POA: Diagnosis present

## 2018-01-30 DIAGNOSIS — E11649 Type 2 diabetes mellitus with hypoglycemia without coma: Secondary | ICD-10-CM | POA: Diagnosis present

## 2018-01-30 DIAGNOSIS — R1013 Epigastric pain: Secondary | ICD-10-CM | POA: Diagnosis not present

## 2018-01-30 DIAGNOSIS — D72829 Elevated white blood cell count, unspecified: Secondary | ICD-10-CM | POA: Diagnosis present

## 2018-01-30 DIAGNOSIS — E876 Hypokalemia: Secondary | ICD-10-CM | POA: Diagnosis present

## 2018-01-30 DIAGNOSIS — G8929 Other chronic pain: Secondary | ICD-10-CM | POA: Diagnosis not present

## 2018-01-30 DIAGNOSIS — K5730 Diverticulosis of large intestine without perforation or abscess without bleeding: Secondary | ICD-10-CM | POA: Diagnosis present

## 2018-01-30 DIAGNOSIS — R51 Headache: Secondary | ICD-10-CM | POA: Diagnosis present

## 2018-01-30 DIAGNOSIS — K5190 Ulcerative colitis, unspecified, without complications: Secondary | ICD-10-CM | POA: Diagnosis present

## 2018-01-30 DIAGNOSIS — J449 Chronic obstructive pulmonary disease, unspecified: Secondary | ICD-10-CM | POA: Diagnosis present

## 2018-01-30 DIAGNOSIS — R6510 Systemic inflammatory response syndrome (SIRS) of non-infectious origin without acute organ dysfunction: Secondary | ICD-10-CM | POA: Diagnosis present

## 2018-01-30 DIAGNOSIS — R410 Disorientation, unspecified: Secondary | ICD-10-CM | POA: Diagnosis present

## 2018-01-30 DIAGNOSIS — Z833 Family history of diabetes mellitus: Secondary | ICD-10-CM | POA: Diagnosis not present

## 2018-01-30 DIAGNOSIS — K219 Gastro-esophageal reflux disease without esophagitis: Secondary | ICD-10-CM | POA: Diagnosis present

## 2018-01-30 DIAGNOSIS — R0789 Other chest pain: Secondary | ICD-10-CM | POA: Diagnosis present

## 2018-01-30 DIAGNOSIS — Z87891 Personal history of nicotine dependence: Secondary | ICD-10-CM | POA: Diagnosis not present

## 2018-01-30 DIAGNOSIS — E872 Acidosis, unspecified: Secondary | ICD-10-CM | POA: Diagnosis present

## 2018-01-30 DIAGNOSIS — Z6833 Body mass index (BMI) 33.0-33.9, adult: Secondary | ICD-10-CM | POA: Diagnosis not present

## 2018-01-30 DIAGNOSIS — Z96653 Presence of artificial knee joint, bilateral: Secondary | ICD-10-CM | POA: Diagnosis present

## 2018-01-30 DIAGNOSIS — E785 Hyperlipidemia, unspecified: Secondary | ICD-10-CM | POA: Diagnosis present

## 2018-01-30 DIAGNOSIS — I10 Essential (primary) hypertension: Secondary | ICD-10-CM | POA: Diagnosis present

## 2018-01-30 DIAGNOSIS — K769 Liver disease, unspecified: Secondary | ICD-10-CM | POA: Diagnosis present

## 2018-01-30 LAB — TROPONIN I
Troponin I: <0.03 ng/mL (ref <0.03)
Troponin I: <0.03 ng/mL (ref <0.03)

## 2018-01-30 LAB — GLUCOSE, CAPILLARY
GLUCOSE-CAPILLARY: 200 mg/dL — AB (ref 70–99)
GLUCOSE-CAPILLARY: 264 mg/dL — AB (ref 70–99)
Glucose-Capillary: 210 mg/dL — ABNORMAL HIGH (ref 70–99)
Glucose-Capillary: 197 mg/dL — ABNORMAL HIGH (ref 70–99)
Glucose-Capillary: 211 mg/dL — ABNORMAL HIGH (ref 70–99)
Glucose-Capillary: 271 mg/dL — ABNORMAL HIGH (ref 70–99)
Glucose-Capillary: 265 mg/dL — ABNORMAL HIGH (ref 70–99)

## 2018-01-30 LAB — CBC
HCT: 47.4 % — ABNORMAL HIGH (ref 36.0–46.0)
Hemoglobin: 14.3 g/dL (ref 12.0–15.0)
MCH: 26.2 pg (ref 26.0–34.0)
MCHC: 30.2 g/dL (ref 30.0–36.0)
MCV: 86.8 fL (ref 80.0–100.0)
NRBC: 0 % (ref 0.0–0.2)
Platelets: 260 10*3/uL (ref 150–400)
RBC: 5.46 MIL/uL — ABNORMAL HIGH (ref 3.87–5.11)
RDW: 17.4 % — ABNORMAL HIGH (ref 11.5–15.5)
WBC: 18 10*3/uL — ABNORMAL HIGH (ref 4.0–10.5)

## 2018-01-30 LAB — URINE DRUG SCREEN, QUALITATIVE (ARMC ONLY)
Amphetamines, Ur Screen: NOT DETECTED
Barbiturates, Ur Screen: NOT DETECTED
Benzodiazepine, Ur Scrn: NOT DETECTED
Cannabinoid 50 Ng, Ur ~~LOC~~: NOT DETECTED
Cocaine Metabolite,Ur ~~LOC~~: NOT DETECTED
MDMA (Ecstasy)Ur Screen: NOT DETECTED
METHADONE SCREEN, URINE: NOT DETECTED
Opiate, Ur Screen: NOT DETECTED
Phencyclidine (PCP) Ur S: NOT DETECTED
Tricyclic, Ur Screen: NOT DETECTED

## 2018-01-30 LAB — COMPREHENSIVE METABOLIC PANEL
ALT: 45 U/L — ABNORMAL HIGH (ref 0–44)
AST: 63 U/L — ABNORMAL HIGH (ref 15–41)
Albumin: 4 g/dL (ref 3.5–5.0)
Alkaline Phosphatase: 73 U/L (ref 38–126)
Anion gap: 28 — ABNORMAL HIGH (ref 5–15)
BUN: 16 mg/dL (ref 6–20)
CALCIUM: 7.8 mg/dL — AB (ref 8.9–10.3)
CO2: 9 mmol/L — AB (ref 22–32)
Chloride: 101 mmol/L (ref 98–111)
Creatinine, Ser: 0.75 mg/dL (ref 0.44–1.00)
GFR calc Af Amer: >60 mL/min (ref >60)
GFR calc non Af Amer: >60 mL/min (ref >60)
Glucose, Bld: 228 mg/dL — ABNORMAL HIGH (ref 70–99)
Potassium: 2.8 mmol/L — ABNORMAL LOW (ref 3.5–5.1)
Sodium: 138 mmol/L (ref 135–145)
Total Bilirubin: 1.4 mg/dL — ABNORMAL HIGH (ref 0.3–1.2)
Total Protein: 7.5 g/dL (ref 6.5–8.1)

## 2018-01-30 LAB — BASIC METABOLIC PANEL
Anion gap: 18 — ABNORMAL HIGH (ref 5–15)
BUN: 16 mg/dL (ref 6–20)
CO2: 16 mmol/L — ABNORMAL LOW (ref 22–32)
Calcium: 7.1 mg/dL — ABNORMAL LOW (ref 8.9–10.3)
Chloride: 98 mmol/L (ref 98–111)
Creatinine, Ser: 0.81 mg/dL (ref 0.44–1.00)
GFR calc Af Amer: >60 mL/min (ref >60)
GFR calc non Af Amer: >60 mL/min (ref >60)
Glucose, Bld: 223 mg/dL — ABNORMAL HIGH (ref 70–99)
Potassium: 3.2 mmol/L — ABNORMAL LOW (ref 3.5–5.1)
Sodium: 132 mmol/L — ABNORMAL LOW (ref 135–145)

## 2018-01-30 LAB — MRSA PCR SCREENING: MRSA BY PCR: NEGATIVE

## 2018-01-30 LAB — PHOSPHORUS: Phosphorus: 2 mg/dL — ABNORMAL LOW (ref 2.5–4.6)

## 2018-01-30 LAB — LACTIC ACID, PLASMA
LACTIC ACID, VENOUS: 5.6 mmol/L — AB (ref 0.5–1.9)
Lactic Acid, Venous: 9.4 mmol/L (ref 0.5–1.9)
Lactic Acid, Venous: 6.4 mmol/L (ref 0.5–1.9)

## 2018-01-30 LAB — MAGNESIUM
Magnesium: 1.8 mg/dL (ref 1.7–2.4)
Magnesium: 1.4 mg/dL — ABNORMAL LOW (ref 1.7–2.4)

## 2018-01-30 LAB — PROCALCITONIN: Procalcitonin: 0.1 ng/mL

## 2018-01-30 LAB — PATHOLOGIST SMEAR REVIEW

## 2018-01-30 MED ORDER — THIAMINE HCL 100 MG/ML IJ SOLN
Freq: Once | INTRAVENOUS | Status: AC
  Administered 2018-01-30: 03:00:00 via INTRAVENOUS
  Filled 2018-01-30: qty 1000

## 2018-01-30 MED ORDER — MAGNESIUM SULFATE 4 GM/100ML IV SOLN
4.0000 g | Freq: Once | INTRAVENOUS | Status: AC
  Administered 2018-01-30: 4 g via INTRAVENOUS
  Filled 2018-01-30: qty 100

## 2018-01-30 MED ORDER — ONDANSETRON HCL 4 MG PO TABS: 4.0000 mg | ORAL_TABLET | Freq: Four times a day (QID) | ORAL | Status: DC | PRN

## 2018-01-30 MED ORDER — LATANOPROST 0.005 % OP SOLN: 1.0000 [drp] | Freq: Every day | OPHTHALMIC | Status: DC

## 2018-01-30 MED ORDER — POTASSIUM CHLORIDE CRYS ER 20 MEQ PO TBCR: 40.0000 meq | EXTENDED_RELEASE_TABLET | Freq: Once | ORAL | Status: DC

## 2018-01-30 MED ORDER — METOPROLOL SUCCINATE ER 25 MG PO TB24
25.0000 mg | ORAL_TABLET | Freq: Every day | ORAL | Status: DC
  Administered 2018-01-30 – 2018-01-31 (×2): 25 mg via ORAL
  Filled 2018-01-30 (×2): qty 1

## 2018-01-30 MED ORDER — IBUPROFEN 400 MG PO TABS
600.0000 mg | ORAL_TABLET | Freq: Once | ORAL | Status: AC
  Administered 2018-01-30: 600 mg via ORAL
  Filled 2018-01-30: qty 2

## 2018-01-30 MED ORDER — ALBUTEROL SULFATE (2.5 MG/3ML) 0.083% IN NEBU: 3.0000 mL | INHALATION_SOLUTION | RESPIRATORY_TRACT | Status: DC | PRN

## 2018-01-30 MED ORDER — ENOXAPARIN SODIUM 40 MG/0.4ML ~~LOC~~ SOLN
40.0000 mg | SUBCUTANEOUS | Status: DC
  Administered 2018-01-30: 40 mg via SUBCUTANEOUS
  Filled 2018-01-30: qty 0.4

## 2018-01-30 MED ORDER — INSULIN ASPART 100 UNIT/ML ~~LOC~~ SOLN
0.0000 [IU] | Freq: Three times a day (TID) | SUBCUTANEOUS | Status: DC
  Administered 2018-01-30: 5 [IU] via SUBCUTANEOUS
  Administered 2018-01-30: 2 [IU] via SUBCUTANEOUS
  Administered 2018-01-30 – 2018-01-31 (×2): 5 [IU] via SUBCUTANEOUS
  Filled 2018-01-30 (×4): qty 1

## 2018-01-30 MED ORDER — SODIUM CHLORIDE 0.9 % IV SOLN
INTRAVENOUS | Status: DC | PRN
  Administered 2018-01-30 – 2018-01-31 (×4): via INTRAVENOUS

## 2018-01-30 MED ORDER — VITAMIN B-1 100 MG PO TABS
100.0000 mg | ORAL_TABLET | Freq: Every day | ORAL | Status: DC
  Administered 2018-01-31: 100 mg via ORAL
  Filled 2018-01-30: qty 1

## 2018-01-30 MED ORDER — SUCRALFATE 1 G PO TABS
1.0000 g | ORAL_TABLET | Freq: Two times a day (BID) | ORAL | Status: DC
  Administered 2018-01-30 – 2018-01-31 (×3): 1 g via ORAL
  Filled 2018-01-30 (×3): qty 1

## 2018-01-30 MED ORDER — LATANOPROST 0.005 % OP SOLN
1.0000 [drp] | Freq: Every day | OPHTHALMIC | Status: DC
  Administered 2018-01-30: 1 [drp] via OPHTHALMIC
  Filled 2018-01-30: qty 2.5

## 2018-01-30 MED ORDER — METRONIDAZOLE IN NACL 5-0.79 MG/ML-% IV SOLN
500.0000 mg | Freq: Three times a day (TID) | INTRAVENOUS | Status: DC
  Administered 2018-01-30 – 2018-01-31 (×4): 500 mg via INTRAVENOUS
  Filled 2018-01-30 (×6): qty 100

## 2018-01-30 MED ORDER — FAMOTIDINE IN NACL 20-0.9 MG/50ML-% IV SOLN
20.0000 mg | INTRAVENOUS | Status: DC
  Administered 2018-01-30: 20 mg via INTRAVENOUS
  Filled 2018-01-30: qty 50

## 2018-01-30 MED ORDER — ADULT MULTIVITAMIN W/MINERALS CH
1.0000 | ORAL_TABLET | Freq: Every day | ORAL | Status: DC
  Administered 2018-01-31: 1 via ORAL
  Filled 2018-01-30 (×2): qty 1

## 2018-01-30 MED ORDER — POTASSIUM PHOSPHATE MONOBASIC 500 MG PO TABS
1000.0000 mg | ORAL_TABLET | Freq: Three times a day (TID) | ORAL | Status: AC
  Administered 2018-01-30: 1000 mg via ORAL
  Filled 2018-01-30 (×2): qty 2

## 2018-01-30 MED ORDER — ONDANSETRON HCL 4 MG/2ML IJ SOLN: 4.0000 mg | Freq: Four times a day (QID) | INTRAMUSCULAR | Status: DC | PRN

## 2018-01-30 MED ORDER — ACETAMINOPHEN 325 MG PO TABS: 650.0000 mg | ORAL_TABLET | Freq: Four times a day (QID) | ORAL | Status: DC | PRN

## 2018-01-30 MED ORDER — CALCIUM GLUCONATE-NACL 1-0.675 GM/50ML-% IV SOLN
1.0000 g | Freq: Once | INTRAVENOUS | Status: AC
  Administered 2018-01-30: 1000 mg via INTRAVENOUS
  Filled 2018-01-30: qty 50

## 2018-01-30 MED ORDER — INSULIN ASPART 100 UNIT/ML ~~LOC~~ SOLN
0.0000 [IU] | Freq: Every day | SUBCUTANEOUS | Status: DC
  Administered 2018-01-30: 3 [IU] via SUBCUTANEOUS
  Filled 2018-01-30: qty 1

## 2018-01-30 MED ORDER — ENOXAPARIN SODIUM 40 MG/0.4ML ~~LOC~~ SOLN: 40.0000 mg | SUBCUTANEOUS | Status: DC

## 2018-01-30 MED ORDER — FOLIC ACID 1 MG PO TABS
1.0000 mg | ORAL_TABLET | Freq: Every day | ORAL | Status: DC
  Administered 2018-01-30 – 2018-01-31 (×2): 1 mg via ORAL
  Filled 2018-01-30 (×2): qty 1

## 2018-01-30 MED ORDER — TECHNETIUM TC 99M MEBROFENIN IV KIT
5.5000 | PACK | Freq: Once | INTRAVENOUS | Status: AC | PRN
  Administered 2018-01-30: 5.5 via INTRAVENOUS

## 2018-01-30 MED ORDER — STERILE WATER FOR INJECTION IV SOLN
INTRAVENOUS | Status: DC
  Administered 2018-01-30 – 2018-01-31 (×3): via INTRAVENOUS
  Filled 2018-01-30 (×3): qty 850

## 2018-01-30 MED ORDER — FAMOTIDINE 20 MG PO TABS
20.0000 mg | ORAL_TABLET | Freq: Every day | ORAL | Status: DC
  Administered 2018-01-31: 20 mg via ORAL
  Filled 2018-01-30: qty 1

## 2018-01-30 MED ORDER — LINAGLIPTIN 5 MG PO TABS
5.0000 mg | ORAL_TABLET | Freq: Every day | ORAL | Status: DC
  Administered 2018-01-31: 5 mg via ORAL
  Filled 2018-01-30: qty 1

## 2018-01-30 MED ORDER — CIPROFLOXACIN IN D5W 400 MG/200ML IV SOLN
400.0000 mg | Freq: Two times a day (BID) | INTRAVENOUS | Status: DC
  Administered 2018-01-30 – 2018-01-31 (×3): 400 mg via INTRAVENOUS
  Filled 2018-01-30 (×5): qty 200

## 2018-01-30 MED ORDER — DORZOLAMIDE HCL 2 % OP SOLN
1.0000 [drp] | Freq: Two times a day (BID) | OPHTHALMIC | Status: DC
  Administered 2018-01-30 – 2018-01-31 (×3): 1 [drp] via OPHTHALMIC
  Filled 2018-01-30: qty 10

## 2018-01-30 NOTE — Consult Note (Signed)
Sheila Lame, MD [facility]  [address] [place], [place] [address] Phone: [phone] Fax : [phone]  Consultation  Referring Provider:     Dr. Manuella Ghazi Primary Care Physician:  Ellamae Sia, MD Primary Gastroenterologist:  Dr. Gustavo Lah         Reason for Consultation:     Abdominal pain  Date of Admission:  01/29/2018 Date of Consultation:  01/30/2018         HPI:   Sheila [name] is a 58 y.o. female who has had follow-up with Dr. Marton Redwood nurse practitioner for chronic abdominal pain.  The patient states the pain was so severe yesterday that she was brought to the ER.  The patient was found to have a lactic acid over 11 yesterday and has been checked 3 times today with a going from >11 to 9.4 then 6.4 and finally 5.6. This is over the last 24 hours.  The patient reports that the abdominal pain is worse when she has to move her bowels and better after she does move her bowels.  There is no association with eating.  When I came to see the patient she had been sitting up in no distress and had just finished her entire plate of food.  The patient reports that she drinks a pint of alcohol every day when it is a good day and double that on a bad day.  The patient reports that she has burning in the epigastric area with nausea vomiting and a cough.  The patient had an EGD back in January 6 of this year.  It was reported that she had gastritis.  The patient is adamant that she does not believe her drinking is contributing to her abdominal pain at all.  Patient has elevated liver enzymes consistent with her alcohol abuse. The patient's work-up has included an MRCP with a small hypervascular lesion in the liver with recommended repeat MRI in 6 months.  There was also mild lymphadenopathy in the porta hepatis report is likely reactive.  A CT scan from yesterday showed mild gallbladder wall thickening versus pericholecystic fluid and increased number of small lymph nodes in the central  small bowel mesentery with mesenteric edema and a small amount of free fluid.  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  Follow-up ultrasound showed hyperechoic area adjacent to the gallbladder corresponding to the same thing seen on the earlier CT favoring a small peri-cholecystic fluid collection.  There is also hepatic steatosis seen.  Past Medical History:  Diagnosis Date  . Anemia   . Arthritis   . Asthma   . COPD (chronic obstructive pulmonary disease) ([place])   . DDD (degenerative disc disease), lumbar   . Diabetes mellitus without complication ([place])    on metformin  . DJD (degenerative joint disease)   . Dysrhythmia   . Fatty liver   . Fibroids   . GERD (gastroesophageal reflux disease)   . Glaucoma   . Hyperlipidemia   . Hypertension   . IBS (irritable bowel syndrome)   . Lumbago   . Neuromuscular disorder ([place])   . 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[place]) 35/0093  . Renal disorder   . Ulcerative colitis ([place])    on mesalamine    Past Surgical History:  Procedure Laterality Date  . COLONOSCOPY WITH PROPOFOL N/A 08/19/2015   Procedure: COLONOSCOPY WITH PROPOFOL;  Surgeon: Lollie Sails, MD;  Location: [facility] ENDOSCOPY;  Service: Endoscopy;  Laterality: N/A;  . COLONOSCOPY WITH PROPOFOL N/A 12/08/2015   Procedure:  COLONOSCOPY WITH PROPOFOL;  Surgeon: Lollie Sails, MD;  Location: [facility] ENDOSCOPY;  Service: Endoscopy;  Laterality: N/A;  . COLONOSCOPY WITH PROPOFOL N/A 12/09/2015   Procedure: COLONOSCOPY WITH PROPOFOL;  Surgeon: Lollie Sails, MD;  Location: [facility] ENDOSCOPY;  Service: Endoscopy;  Laterality: N/A;  . ESOPHAGOGASTRODUODENOSCOPY (EGD) WITH PROPOFOL N/A 11/08/2017   Procedure: ESOPHAGOGASTRODUODENOSCOPY (EGD) WITH PROPOFOL;  Surgeon: Lollie Sails, MD;  Location: [facility] ENDOSCOPY;  Service: Endoscopy;  Laterality: N/A;  . JOINT REPLACEMENT Left 04/19/2006  . JOINT REPLACEMENT Right 01/14/2015  . TOTAL KNEE ARTHROPLASTY Left 2008  . TOTAL KNEE ARTHROPLASTY Right  01/14/2015   Procedure: TOTAL KNEE ARTHROPLASTY;  Surgeon: Hessie Knows, MD;  Location: [facility] ORS;  Service: Orthopedics;  Laterality: Right;    Prior to Admission medications   Medication Sig Start Date End Date Taking? Authorizing Provider  albuterol (PROVENTIL HFA;VENTOLIN HFA) 108 (90 BASE) MCG/ACT inhaler Inhale 2 puffs into the lungs every 4 (four) hours as needed for wheezing or shortness of breath.   Yes [provider]  chlorthalidone (HYGROTON) 25 MG tablet Take 25 mg by mouth daily.  01/04/15  Yes [provider]  [employer] VITAMIN B12 1000 MCG tablet Take 1,000 mcg by mouth daily. 07/20/17  Yes [provider]  dorzolamide (TRUSOPT) 2 % ophthalmic solution Place 1 drop into both eyes 2 (two) times daily. 07/17/17  Yes [provider]  KLOR-CON M20 20 MEQ tablet Take 40 mEq by mouth daily.  01/01/15  Yes [provider]  latanoprost (XALATAN) 0.005 % ophthalmic solution Place 1 drop into both eyes at bedtime. 01/01/18  Yes [provider]  losartan (COZAAR) 25 MG tablet Take 25 mg by mouth daily.    Yes [provider]  mesalamine (APRISO) 0.375 G 24 hr capsule Take 1,500 mg by mouth daily.    Yes [provider]  metFORMIN (GLUCOPHAGE-XR) 500 MG 24 hr tablet Take 500 mg by mouth 2 (two) times daily.    Yes [provider]  metoprolol succinate (TOPROL-XL) 50 MG 24 hr tablet Take 50 mg by mouth at bedtime. 01/17/18  Yes [provider]  pantoprazole (PROTONIX) 40 MG tablet Take 40 mg by mouth every morning.   Yes [provider]  pravastatin (PRAVACHOL) 10 MG tablet Take 10 mg by mouth at bedtime.   Yes [provider]  sucralfate (CARAFATE) 1 g tablet Take 1 g by mouth 2 (two) times daily.   Yes [provider]  allopurinol (ZYLOPRIM) 100 MG tablet Take 100 mg by mouth daily. 08/04/17   [provider]  bimatoprost (LUMIGAN) 0.03 % ophthalmic solution Place 1 drop into both  eyes at bedtime.    [provider]  colchicine 0.6 MG tablet Take 0.6 mg by mouth daily.    [provider]  gabapentin (NEURONTIN) 300 MG capsule Take 300 mg by mouth 3 (three) times daily.    [provider]  Magnesium Oxide (MAG-OXIDE) 200 MG TABS Take 2 tablets (400 mg total) by mouth 2 (two) times daily. 09/01/17   Dustin Flock, MD  metoprolol succinate (TOPROL-XL) 25 MG 24 hr tablet Take 1 tablet (25 mg total) by mouth daily. Patient not taking: Reported on 01/29/2018 01/17/15   Duanne Guess, PA-C  ondansetron (ZOFRAN) 4 MG tablet Take 1 tablet (4 mg total) by mouth every 8 (eight) hours as needed for nausea or vomiting. 08/28/17   Schuyler Amor, MD  ondansetron (ZOFRAN) 4 MG tablet Take 1 tablet (4  mg total) by mouth every 6 (six) hours as needed for nausea. 09/01/17   Dustin Flock, MD    Family History  Problem Relation Age of Onset  . Hypertension Mother   . Diabetes Father   . Hypertension Father   . Diabetes Brother   . Hypertension Brother   . Breast cancer Neg Hx      Social History   Tobacco Use  . Smoking status: Former Smoker    Last attempt to quit: 01/04/1978    Years since quitting: 40.0  . Smokeless tobacco: Never Used  Substance Use Topics  . Alcohol use: Yes    Comment: Recovering alcoholic since 97/9892  . Drug use: No    Comment: used cocaine, quit in 2003    Allergies as of 01/29/2018 - Review Complete 01/29/2018  Allergen Reaction Noted  . Ampicillin Anaphylaxis 11/22/2014  . Erythromycin Anaphylaxis 11/22/2014  . Omeprazole Anaphylaxis 11/22/2014  . Penicillins Anaphylaxis and Other (See Comments) 11/22/2014  . Quinapril Cough 08/18/2015  . Triamterene-hctz Other (See Comments) 08/18/2015    Review of Systems:    All systems reviewed and negative except where noted in HPI.   Physical Exam:  Vital signs in last 24 hours: Temp:  [97.4 F (36.3 C)-98 F (36.7 C)] 97.5 F (36.4 C) (01/27 1048) Pulse Rate:   [88-101] 92 (01/27 1048) Resp:  [18-28] 19 (01/27 1048) BP: (117-149)/(58-111) 130/73 (01/27 1048) SpO2:  [89 %-100 %] 100 % (01/27 1048) Weight:  [75.8 kg] 75.8 kg (01/26 2017) Last BM Date: 01/29/18 General:   Pleasant, cooperative in NAD Head:  Normocephalic and atraumatic. Eyes:   No icterus.   Conjunctiva pink. PERRLA. Ears:  Normal auditory acuity. Neck:  Supple; no masses or thyroidomegaly Lungs: Respirations even and unlabored. Lungs clear to auscultation bilaterally.   No wheezes, crackles, or rhonchi.  Heart:  Regular rate and rhythm;  Without murmur, clicks, rubs or gallops Abdomen:  Soft, nondistended, there was tenderness on palpation of the abdominal wall with one finger while flexing the abdominal wall muscles. Normal bowel sounds. No appreciable masses or hepatomegaly.  No rebound or guarding.  Rectal:  Not performed. Msk:  Symmetrical without gross deformities.    Extremities:  Without edema, cyanosis or clubbing. Neurologic:  Alert and oriented x3;  grossly normal neurologically. Skin:  Intact without significant lesions or rashes. Cervical Nodes:  No significant cervical adenopathy. Psych:  Alert and cooperative. Normal affect.  LAB RESULTS: Recent Labs    01/29/18 2056 01/30/18 0150  WBC 26.6* 18.0*  HGB 14.6 14.3  HCT 47.3* 47.4*  PLT 341 260   BMET Recent Labs    01/29/18 2056 01/30/18 0150 01/30/18 0943  NA 139 138 132*  K 2.0* 2.8* 3.2*  CL 95* 101 98  CO2 7* 9* 16*  GLUCOSE <20* 228* 223*  BUN 16 16 16   CREATININE 0.75 0.75 0.81  CALCIUM 8.7* 7.8* 7.1*   LFT Recent Labs    01/30/18 0150  PROT 7.5  ALBUMIN 4.0  AST 63*  ALT 45*  ALKPHOS 73  BILITOT 1.4*   PT/INR No results for input(s): LABPROT, INR in the last 72 hours.  STUDIES: Dg Chest 2 View  Result Date: 01/29/2018 CLINICAL DATA:  Chest pain and nausea EXAM: CHEST - 2 VIEW COMPARISON:  07/11/2016 FINDINGS: Cardiac shadows within normal limits. The lungs are well aerated  bilaterally. No focal infiltrate or sizable effusion is seen. Degenerative changes of the thoracic spine are noted. Old rib fractures on  the left are noted. IMPRESSION: No acute abnormality noted. Electronically Signed   By: Inez Catalina M.D.   On: 01/29/2018 20:59   Ct Abdomen Pelvis W Contrast  Result Date: 01/30/2018 CLINICAL DATA:  Nausea. Abdominal pain. Elevated lactate. EXAM: CT ABDOMEN AND PELVIS WITH CONTRAST TECHNIQUE: Multidetector CT imaging of the abdomen and pelvis was performed using the standard protocol following bolus administration of intravenous contrast. CONTRAST:  129m ISOVUE-300 IOPAMIDOL (ISOVUE-300) INJECTION 61% COMPARISON:  CT 08/28/2017, abdominal MRI 01/09/2018 FINDINGS: Lower chest: Scattered atelectasis without confluent airspace disease. No pleural fluid. The heart is normal in size. Hepatobiliary: Diffusely decreased hepatic density consistent with steatosis. No definite CT correlate of liver lesions on recent MRI. Gallbladder is physiologically distended, there is gallbladder wall thickening of 7 mm versus pericholecystic fluid. No calcified gallstone. No biliary dilatation. Unchanged exophytic or capsular calcification in the right lobe. Pancreas: No ductal dilatation or inflammation. Spleen: Normal in size without focal abnormality. Adrenals/Urinary Tract: Normal adrenal glands. No hydronephrosis or perinephric edema. Homogeneous renal enhancement with symmetric excretion on delayed phase imaging. Urinary bladder is decompressed by Foley catheter. Stomach/Bowel: Small amount of fluid in the distal esophagus. Stomach physiologically distended. Probable ingested contents in the dependent stomach. Ingested contents versus mucosal hyperemia of the distal stomach. Mesenteric edema with lymph nodes in the central small bowel mesentery a but no associated small bowel wall thickening or inflammatory change. No obstruction. Air-filled normal appendix. Small to moderate colonic stool  burden. Mild sigmoid diverticulosis without diverticulitis. No colonic wall thickening or inflammatory change. Vascular/Lymphatic: Normal caliber abdominal aorta. Portal and mesenteric veins are patent. Increased number of multiple small lymph nodes in the central small bowel mesentery, largest 7 mm short axis dimension with associated mesenteric edema small amount free fluid. No pelvic adenopathy. Prominent porta hepatis nodes similar to slightly decreased in size from prior CT. Reproductive: Fibroid uterus with multiple fibroids, some of which are calcified, largest posteriorly measures 4.5 cm. Tubal ligation clips. Other: Small amount of free fluid in the central mesentery. No free air. No intra-abdominal abscess. Musculoskeletal: There are no acute or suspicious osseous abnormalities. IMPRESSION: 1. Mild gallbladder wall thickening versus pericholecystic fluid. Recommend right upper quadrant ultrasound for further evaluation. 2. Increased number of small lymph nodes in the central small bowel mesentery with associated mesenteric edema and small amount of free fluid. 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 No associated small bowel abnormality. 3. Prominent porta hepatis lymph nodes, unchanged to slightly improved from prior CT, in similar to recent MRI. 4. Chronic findings of mild colonic diverticulosis without diverticulitis. Fibroid uterus. Hepatic steatosis. Electronically Signed   By: MKeith RakeM.D.   On: 01/30/2018 00:25   UK[place]Abdomen Limited Ruq  Result Date: 01/30/2018 CLINICAL DATA:  Abdominal pain EXAM: ULTRASOUND ABDOMEN LIMITED RIGHT UPPER QUADRANT COMPARISON:  CT abdomen pelvis 01/29/2018 MRI abdomen 01/09/2018 FINDINGS: Gallbladder: No cholelithiasis. Hypoechoic region anterior to the gallbladder measures 6 mm in thickness. A negative sonographic MPercell Millersign was reported by the sonographer. Common bile duct: Diameter: 5 mm Liver: Hyperechoic liver. No focal lesion.  Portal vein is patent on color Doppler imaging with normal direction of blood flow towards the liver. IMPRESSION: 1. Hypoechoic area adjacent to the gallbladder, corresponding to the finding on earlier CT. This is favored to be a small pericholecystic fluid collection. No cholelithiasis or other evidence of acute cholecystitis. 2. Hyperechoic liver may indicate hepatic steatosis. Electronically Signed   By: KUlyses JarredM.D.   On: 01/30/2018  03:09      Impression / Plan:   Assessment: Principal Problem:   SIRS (systemic inflammatory response syndrome) (HCC) Active Problems:   Diabetes (HCC)   HTN (hypertension)   HLD (hyperlipidemia)   GERD (gastroesophageal reflux disease)   Hypokalemia   Hypoglycemia   Lactic acidosis   Alcohol abuse   Sheila [name] is a 58 y.o. y/o female with an increased lactic acid with abdominal pain made worse with straining to have a bowel movement but not otherwise affected with eating or drinking.  The patient had a MRI, CT scan and ultrasound within the last month in the work-up of her abdominal pain.  The patient also had a gallbladder ejection fraction done back in November that was normal.  On physical exam the patient clearly has a component of musculoskeletal pain.  There was some suggestion that the patient may have imaging consistent with sclerosing mesenteritis.  The patient appears to be pain-free when I saw her today and had completely finished her tray of food.  Plan:  The patient has no clear sign of what caused her increased lactic acid and it is coming down.  The patient is pain-free except when you press on her abdomen with the legs raised above the bed thereby flexing the abdominal wall muscles.  The pain is exponentially increased with muscle wall flexion.  Since the patient has already had a pretty complete work-up including upper endoscopy I do not think repeating these tests at this time would be helpful.  The patient has also been adamant  about not stopping her drinking.  I would recommend the patient follow-up with her primary gastroenterologist when she is discharged from the hospital.  Thank you for involving me in the care of this patient.      LOS: 0 days   Sheila Lame, MD  01/30/2018, 1:05 PM    Note: This dictation was prepared with Dragon dictation along with smaller phrase technology. Any transcriptional errors that result from this process are unintentional.

## 2018-01-30 NOTE — Progress Notes (Signed)
[facility] NAME: Sheelah Ritacco    MR#:  [id number]  DATE OF BIRTH:  1960/06/03  SUBJECTIVE:  CHIEF COMPLAINT:   Chief Complaint  Patient presents with  . Chest Pain  c/o epigastric burning pain. Would like to get further w/up REVIEW OF SYSTEMS:  Review of Systems  Constitutional: Negative for diaphoresis, fever, malaise/fatigue and weight loss.  HENT: Negative for ear discharge, ear pain, hearing loss, nosebleeds, sore throat and tinnitus.   Eyes: Negative for blurred vision and pain.  Respiratory: Negative for cough, hemoptysis, shortness of breath and wheezing.   Cardiovascular: Negative for chest pain, palpitations, orthopnea and leg swelling.  Gastrointestinal: Positive for abdominal pain and heartburn. Negative for blood in stool, constipation, diarrhea, nausea and vomiting.  Genitourinary: Negative for dysuria, frequency and urgency.  Musculoskeletal: Negative for back pain and myalgias.  Skin: Negative for itching and rash.  Neurological: Negative for dizziness, tingling, tremors, focal weakness, seizures, weakness and headaches.  Psychiatric/Behavioral: Negative for depression. The patient is not nervous/anxious.    DRUG ALLERGIES:   Allergies  Allergen Reactions  . Ampicillin Anaphylaxis  . Erythromycin Anaphylaxis  . Omeprazole Anaphylaxis  . Penicillins Anaphylaxis and Other (See Comments)    Has patient had a PCN reaction causing immediate rash, facial/tongue/throat swelling, SOB or lightheadedness with hypotension: Yes Has patient had a PCN reaction causing severe rash involving mucus membranes or skin necrosis: No Has patient had a PCN reaction that required hospitalization: Unknown Has patient had a PCN reaction occurring within the last 10 years: Yes If all of the above answers are "NO", then may proceed with Cephalosporin use.   . Quinapril Cough  . Triamterene-Hctz Other (See Comments)   VITALS:    Blood pressure 131/64, pulse 94, temperature 98.2 F (36.8 C), temperature source Oral, resp. rate 18, height 5' (1.524 m), weight 75.8 kg, SpO2 98 %. PHYSICAL EXAMINATION:  Physical Exam HENT:     Head: Normocephalic and atraumatic.  Eyes:     Conjunctiva/sclera: Conjunctivae normal.     Pupils: Pupils are equal, round, and reactive to light.  Neck:     Musculoskeletal: Normal range of motion and neck supple.     Thyroid: No thyromegaly.     Trachea: No tracheal deviation.  Cardiovascular:     Rate and Rhythm: Normal rate and regular rhythm.     Heart sounds: Normal heart sounds.  Pulmonary:     Effort: Pulmonary effort is normal. No respiratory distress.     Breath sounds: Normal breath sounds. No wheezing.  Chest:     Chest wall: No tenderness.  Abdominal:     General: Bowel sounds are normal. There is no distension.     Palpations: Abdomen is soft.     Tenderness: There is abdominal tenderness in the right upper quadrant and epigastric area.  Musculoskeletal: Normal range of motion.  Skin:    General: Skin is warm and dry.     Findings: No rash.  Neurological:     Mental Status: [name] alert and oriented to person, place, and time.     Cranial Nerves: No cranial nerve deficit.    LABORATORY PANEL:  Female CBC Recent Labs  Lab 01/30/18 0150  WBC 18.0*  HGB 14.3  HCT 47.4*  PLT 260   ------------------------------------------------------------------------------------------------------------------ Chemistries  Recent Labs  Lab 01/30/18 0150 01/30/18 0943  NA 138 132*  K 2.8* 3.2*  CL 101 98  CO2 9* 16*  GLUCOSE 228* 223*  BUN 16 16  CREATININE 0.75 0.81  CALCIUM 7.8* 7.1*  MG 1.8 1.4*  AST 63*  --   ALT 45*  --   ALKPHOS 73  --   BILITOT 1.4*  --    RADIOLOGY:  Dg Chest 2 View  Result Date: 01/29/2018 CLINICAL DATA:  Chest pain and nausea EXAM: CHEST - 2 VIEW COMPARISON:  07/11/2016 FINDINGS: Cardiac shadows within normal limits. The lungs are well  aerated bilaterally. No focal infiltrate or sizable effusion is seen. Degenerative changes of the thoracic spine are noted. Old rib fractures on the left are noted. IMPRESSION: No acute abnormality noted. Electronically Signed   By: Inez Catalina M.D.   On: 01/29/2018 20:59   Ct Abdomen Pelvis W Contrast  Result Date: 01/30/2018 CLINICAL DATA:  Nausea. Abdominal pain. Elevated lactate. EXAM: CT ABDOMEN AND PELVIS WITH CONTRAST TECHNIQUE: Multidetector CT imaging of the abdomen and pelvis was performed using the standard protocol following bolus administration of intravenous contrast. CONTRAST:  176m ISOVUE-300 IOPAMIDOL (ISOVUE-300) INJECTION 61% COMPARISON:  CT 08/28/2017, abdominal MRI 01/09/2018 FINDINGS: Lower chest: Scattered atelectasis without confluent airspace disease. No pleural fluid. The heart is normal in size. Hepatobiliary: Diffusely decreased hepatic density consistent with steatosis. No definite CT correlate of liver lesions on recent MRI. Gallbladder is physiologically distended, there is gallbladder wall thickening of 7 mm versus pericholecystic fluid. No calcified gallstone. No biliary dilatation. Unchanged exophytic or capsular calcification in the right lobe. Pancreas: No ductal dilatation or inflammation. Spleen: Normal in size without focal abnormality. Adrenals/Urinary Tract: Normal adrenal glands. No hydronephrosis or perinephric edema. Homogeneous renal enhancement with symmetric excretion on delayed phase imaging. Urinary bladder is decompressed by Foley catheter. Stomach/Bowel: Small amount of fluid in the distal esophagus. Stomach physiologically distended. Probable ingested contents in the dependent stomach. Ingested contents versus mucosal hyperemia of the distal stomach. Mesenteric edema with lymph nodes in the central small bowel mesentery a but no associated small bowel wall thickening or inflammatory change. No obstruction. Air-filled normal appendix. Small to moderate colonic  stool burden. Mild sigmoid diverticulosis without diverticulitis. No colonic wall thickening or inflammatory change. Vascular/Lymphatic: Normal caliber abdominal aorta. Portal and mesenteric veins are patent. Increased number of multiple small lymph nodes in the central small bowel mesentery, largest 7 mm short axis dimension with associated mesenteric edema small amount free fluid. No pelvic adenopathy. Prominent porta hepatis nodes similar to slightly decreased in size from prior CT. Reproductive: Fibroid uterus with multiple fibroids, some of which are calcified, largest posteriorly measures 4.5 cm. Tubal ligation clips. Other: Small amount of free fluid in the central mesentery. No free air. No intra-abdominal abscess. Musculoskeletal: There are no acute or suspicious osseous abnormalities. IMPRESSION: 1. Mild gallbladder wall thickening versus pericholecystic fluid. Recommend right upper quadrant ultrasound for further evaluation. 2. Increased number of small lymph nodes in the central small bowel mesentery with associated mesenteric edema and small amount of free fluid. 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 No associated small bowel abnormality. 3. Prominent porta hepatis lymph nodes, unchanged to slightly improved from prior CT, in similar to recent MRI. 4. Chronic findings of mild colonic diverticulosis without diverticulitis. Fibroid uterus. Hepatic steatosis. Electronically Signed   By: MKeith RakeM.D.   On: 01/30/2018 00:25   [place]domen Limited Ruq  Result Date: 01/30/2018 CLINICAL DATA:  Abdominal pain EXAM: ULTRASOUND ABDOMEN LIMITED RIGHT UPPER QUADRANT COMPARISON:  CT abdomen pelvis 01/29/2018 MRI abdomen 01/09/2018 FINDINGS: Gallbladder: No cholelithiasis. Hypoechoic  region anterior to the gallbladder measures 6 mm in thickness. A negative sonographic Percell Miller sign was reported by the sonographer. Common bile duct: Diameter: 5 mm Liver: Hyperechoic liver. No focal  lesion. Portal vein is patent on color Doppler imaging with normal direction of blood flow towards the liver. IMPRESSION: 1. Hypoechoic area adjacent to the gallbladder, corresponding to the finding on earlier CT. This is favored to be a small pericholecystic fluid collection. No cholelithiasis or other evidence of acute cholecystitis. 2. Hyperechoic liver may indicate hepatic steatosis. Electronically Signed   By: Ulyses Jarred M.D.   On: 01/30/2018 03:09   ASSESSMENT AND PLAN:  4 y f with abd pain  * SIRS (systemic inflammatory response syndrome) ([place]) -unclear source of infection at this time, though CT abdomen did show possible gallbladder pathology. ultrasound doesn't show acute patho.   * RUQ pain: getting HIDA scan per surgery and GI   * Hypokalemia -replete and recheck  * Hypoglycemia -corrected with dextrose administration, frequent monitoring for now  * Lactic acidosis -aggressive IV fluids as above, unsure etio  * Alcohol abuse -banana bag upfront, CIWA protocol  * Diabetes ([place]) -patient has been hypoglycemic here and profoundly so, we will do frequent CBG checks but hold any insulin for now, DM nurse c/s   * HTN (hypertension) -continue home dose antihypertensives  * HLD (hyperlipidemia) -continue home dose antilipid    * GERD (gastroesophageal reflux disease) -continue home dose PPI     All the records are reviewed and case discussed with Care Management/Social Worker. Management plans discussed with the patient, family (Daughter at bedside) and they are in agreement.  CODE STATUS: Full Code  TOTAL TIME TAKING CARE OF THIS PATIENT: 35 minutes.   More than 50% of the time was spent in counseling/coordination of care: YES  POSSIBLE D/C IN 1-2 DAYS, DEPENDING ON CLINICAL CONDITION.   Max Sane M.D on 01/30/2018 at 2:31 PM  Between 7am to 6pm - Pager - [phone]  After 6pm go to [email or web address] - Proofreader  [facility]    Office  [phone]  CC: Primary care physician; Ellamae Sia, MD  Note: This dictation was prepared with Dragon dictation along with smaller phrase technology. Any transcriptional errors that result from this process are unintentional.

## 2018-01-30 NOTE — Progress Notes (Signed)
PHARMACIST - PHYSICIAN COMMUNICATION  CONCERNING: IV to Oral Route Change Policy  RECOMMENDATION: This patient is receiving famotidine by the intravenous route.  Based on criteria approved by the Pharmacy and Therapeutics Committee, the intravenous medication(s) is/are being converted to the equivalent oral dose form(s).   DESCRIPTION: These criteria include:  The patient is eating (either orally or via tube) and/or has been taking other orally administered medications for a least 24 hours  The patient has no evidence of active gastrointestinal bleeding or impaired GI absorption (gastrectomy, short bowel, patient on TNA or NPO).  If you have questions about this conversion, please contact the Pharmacy Department   []   [phone] )  [place], [facility] 01/30/2018 9:17 AM

## 2018-01-30 NOTE — Progress Notes (Signed)
CODE SEPSIS - PHARMACY COMMUNICATION  **Broad Spectrum Antibiotics should be administered within 1 hour of Sepsis diagnosis**  Time Code Sepsis Called/Page Received: 0126 2346  Antibiotics Ordered: 0126 2346  Time of 1st antibiotic administration: 0127 0025  Additional action taken by pharmacy:   If necessary, Name of Provider/Nurse Contacted:     Eloise Harman ,PharmD Clinical Pharmacist  01/30/2018  12:30 AM

## 2018-01-30 NOTE — Progress Notes (Signed)
Report given to [facility] RN for patient to be transferred to room 214 with personal belongings and family at bedside during transfer.

## 2018-01-30 NOTE — Progress Notes (Signed)
Pharmacy Electrolyte Monitoring Consult:  Pharmacy consulted to assist in monitoring and replacing electrolytes in this 58 y.o. female admitted on 01/29/2018 with nausea, chest pain, and shortness of breath.   Patient currently on sodium bicarb on infusion at 125nL/hr.   Labs:  Sodium (mmol/L)  Date Value  01/30/2018 132 (L)  11/12/2013 147 (H)   Potassium (mmol/L)  Date Value  01/30/2018 3.2 (L)  11/12/2013 3.9   Magnesium (mg/dL)  Date Value  01/30/2018 1.4 (L)   Phosphorus (mg/dL)  Date Value  01/30/2018 2.0 (L)   Calcium (mg/dL)  Date Value  01/30/2018 7.1 (L)   Calcium, Total (mg/dL)  Date Value  11/12/2013 6.1 (LL)   Albumin (g/dL)  Date Value  01/30/2018 4.0  10/11/2012 3.9    Assessment/Plan: Patient received potassium 54mq IV x 4 doses this am. Will order additional potassium phosphate 10057mx 2 doses, calcium gluconate 1g IV x 1,  and magnesium 4g IV x 1.   Will obtain follow up electrolytes with am labs.   [facility].   Antoinette Haskett L 01/30/2018 2:13 PM

## 2018-01-30 NOTE — H&P (Signed)
[place] [facility] [place] NAME: Sheila [name]    MR#:  [id number]  DATE OF BIRTH:  22-Jun-1960  DATE OF ADMISSION:  01/29/2018  PRIMARY CARE PHYSICIAN: Ellamae Sia, MD   REQUESTING/REFERRING PHYSICIAN: Alfred Levins, MD  CHIEF COMPLAINT:   Chief Complaint  Patient presents with  . Chest Pain    HISTORY OF PRESENT ILLNESS:  Sheila [name]  is a 58 y.o. female who presents with chief complaint as above.  Patient presents the ED with a complaint of abdominal pain and headache.  She states this only started on the day of presentation to the ED in the afternoon, with no prior symptoms.  On arrival here to the ED her glucose was less than 20 and she was very lethargic.  Her glucose level corrected with dextrose administration and she became more alert.  However, she has severe electrolyte derangements including potassium less than 2 and a lactic acid greater than 11.  Her white count is elevated, and CT abdomen showed question of possible gallbladder wall thickening.  Hospitalist were called for admission  PAST MEDICAL HISTORY:   Past Medical History:  Diagnosis Date  . Anemia   . Arthritis   . Asthma   . COPD (chronic obstructive pulmonary disease) ([place])   . DDD (degenerative disc disease), lumbar   . Diabetes mellitus without complication ([place])    on metformin  . DJD (degenerative joint disease)   . Dysrhythmia   . Fatty liver   . Fibroids   . GERD (gastroesophageal reflux disease)   . Glaucoma   . Hyperlipidemia   . Hypertension   . IBS (irritable bowel syndrome)   . Lumbago   . Neuromuscular disorder ([place])   . 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[place]) 57/3220  . Renal disorder   . Ulcerative colitis ([place])    on mesalamine     PAST SURGICAL HISTORY:   Past Surgical History:  Procedure Laterality Date  . COLONOSCOPY WITH PROPOFOL N/A 08/19/2015   Procedure: COLONOSCOPY WITH PROPOFOL;  Surgeon: Lollie Sails, MD;  Location: [facility]  ENDOSCOPY;  Service: Endoscopy;  Laterality: N/A;  . COLONOSCOPY WITH PROPOFOL N/A 12/08/2015   Procedure: COLONOSCOPY WITH PROPOFOL;  Surgeon: Lollie Sails, MD;  Location: [facility] ENDOSCOPY;  Service: Endoscopy;  Laterality: N/A;  . COLONOSCOPY WITH PROPOFOL N/A 12/09/2015   Procedure: COLONOSCOPY WITH PROPOFOL;  Surgeon: Lollie Sails, MD;  Location: [facility] ENDOSCOPY;  Service: Endoscopy;  Laterality: N/A;  . ESOPHAGOGASTRODUODENOSCOPY (EGD) WITH PROPOFOL N/A 11/08/2017   Procedure: ESOPHAGOGASTRODUODENOSCOPY (EGD) WITH PROPOFOL;  Surgeon: Lollie Sails, MD;  Location[facility] ENDOSCOPY;  Service: Endoscopy;  Laterality: N/A;  . JOINT REPLACEMENT Left 04/19/2006  . JOINT REPLACEMENT Right 01/14/2015  . TOTAL KNEE ARTHROPLASTY Left 2008  . TOTAL KNEE ARTHROPLASTY Right 01/14/2015   Procedure: TOTAL KNEE ARTHROPLASTY;  Surgeon: Hessie Knows, MD;  Location: [facility] ORS;  Service: Orthopedics;  Laterality: Right;     SOCIAL HISTORY:   Social History   Tobacco Use  . Smoking status: Former Smoker    Last attempt to quit: 01/04/1978    Years since quitting: 40.0  . Smokeless tobacco: Never Used  Substance Use Topics  . Alcohol use: Yes    Comment: Recovering alcoholic since 25/4270     FAMILY HISTORY:   Family History  Problem Relation Age of Onset  . Hypertension Mother   . Diabetes Father   . Hypertension Father   . Diabetes Brother   . Hypertension  Brother   . Breast cancer Neg Hx      DRUG ALLERGIES:   Allergies  Allergen Reactions  . Ampicillin Anaphylaxis  . Erythromycin Anaphylaxis  . Omeprazole Anaphylaxis  . Penicillins Anaphylaxis and Other (See Comments)    Has patient had a PCN reaction causing immediate rash, facial/tongue/throat swelling, SOB or lightheadedness with hypotension: Yes Has patient had a PCN reaction causing severe rash involving mucus membranes or skin necrosis: No Has patient had a PCN reaction that required hospitalization: Unknown Has  patient had a PCN reaction occurring within the last 10 years: Yes If all of the above answers are "NO", then may proceed with Cephalosporin use.   . Quinapril Cough  . Triamterene-Hctz Other (See Comments)    MEDICATIONS AT HOME:   Prior to Admission medications   Medication Sig Start Date End Date Taking? Authorizing Provider  albuterol (PROVENTIL HFA;VENTOLIN HFA) 108 (90 BASE) MCG/ACT inhaler Inhale 2 puffs into the lungs every 4 (four) hours as needed for wheezing or shortness of breath.   Yes [provider]  chlorthalidone (HYGROTON) 25 MG tablet Take 25 mg by mouth daily.  01/04/15  Yes [provider]  [employer] VITAMIN B12 1000 MCG tablet Take 1,000 mcg by mouth daily. 07/20/17  Yes [provider]  dorzolamide (TRUSOPT) 2 % ophthalmic solution Place 1 drop into both eyes 2 (two) times daily. 07/17/17  Yes [provider]  KLOR-CON M20 20 MEQ tablet Take 40 mEq by mouth daily.  01/01/15  Yes [provider]  latanoprost (XALATAN) 0.005 % ophthalmic solution Place 1 drop into both eyes at bedtime. 01/01/18  Yes [provider]  losartan (COZAAR) 25 MG tablet Take 25 mg by mouth daily.    Yes [provider]  mesalamine (APRISO) 0.375 G 24 hr capsule Take 1,500 mg by mouth daily.    Yes [provider]  metFORMIN (GLUCOPHAGE-XR) 500 MG 24 hr tablet Take 500 mg by mouth 2 (two) times daily.    Yes [provider]  metoprolol succinate (TOPROL-XL) 50 MG 24 hr tablet Take 50 mg by mouth at bedtime. 01/17/18  Yes [provider]  pantoprazole (PROTONIX) 40 MG tablet Take 40 mg by mouth every morning.   Yes [provider]  pravastatin (PRAVACHOL) 10 MG tablet Take 10 mg by mouth at bedtime.   Yes [provider]  sucralfate (CARAFATE) 1 g tablet Take 1 g by mouth 2 (two) times daily.   Yes [provider]  allopurinol (ZYLOPRIM) 100 MG tablet Take 100 mg by mouth daily. 08/04/17    [provider]  bimatoprost (LUMIGAN) 0.03 % ophthalmic solution Place 1 drop into both eyes at bedtime.    [provider]  colchicine 0.6 MG tablet Take 0.6 mg by mouth daily.    [provider]  gabapentin (NEURONTIN) 300 MG capsule Take 300 mg by mouth 3 (three) times daily.    [provider]  Magnesium Oxide (MAG-OXIDE) 200 MG TABS Take 2 tablets (400 mg total) by mouth 2 (two) times daily. 09/01/17   Dustin Flock, MD  metoprolol succinate (TOPROL-XL) 25 MG 24 hr tablet Take 1 tablet (25 mg total) by mouth daily. Patient not taking: Reported on 01/29/2018 01/17/15   Duanne Guess, PA-C  ondansetron [facility]) 4 MG tablet Take 1 tablet (4 mg total) by mouth every 8 (eight) hours as needed for nausea or vomiting. 08/28/17   Schuyler Amor, MD  ondansetron (ZOFRAN) 4 MG tablet  Take 1 tablet (4 mg total) by mouth every 6 (six) hours as needed for nausea. 09/01/17   Dustin Flock, MD    REVIEW OF SYSTEMS:  Review of Systems  Constitutional: Negative for chills, fever, malaise/fatigue and weight loss.  HENT: Negative for ear pain, hearing loss and tinnitus.   Eyes: Negative for blurred vision, double vision, pain and redness.  Respiratory: Negative for cough, hemoptysis and shortness of breath.   Cardiovascular: Negative for chest pain, palpitations, orthopnea and leg swelling.  Gastrointestinal: Positive for abdominal pain. Negative for constipation, diarrhea, nausea and vomiting.  Genitourinary: Negative for dysuria, frequency and hematuria.  Musculoskeletal: Negative for back pain, joint pain and neck pain.  Skin:       No acne, rash, or lesions  Neurological: Positive for headaches. Negative for dizziness, tremors, focal weakness and weakness.  Endo/Heme/Allergies: Negative for polydipsia. Does not bruise/bleed easily.  Psychiatric/Behavioral: Negative for depression. The patient is not nervous/anxious and does not have insomnia.      VITAL  SIGNS:   Vitals:   01/29/18 2021 01/29/18 2145 01/29/18 2200 01/29/18 2230  BP: (!) 139/59 (!) 147/74 (!) 122/111 (!) 132/58  Pulse: 94 95 98 90  Resp: 18 18 (!) 22 (!) 21  TempSrc: Axillary     SpO2: 92% 92% 93% 98%  Weight:      Height:       Wt Readings from Last 3 Encounters:  01/29/18 75.8 kg  11/08/17 65.8 kg  08/29/17 65.8 kg    PHYSICAL EXAMINATION:  Physical Exam  Vitals reviewed. Constitutional: [name] oriented to person, place, and time. She appears well-developed and well-nourished. No distress.  HENT:  Head: Normocephalic and atraumatic.  Mouth/Throat: Oropharynx is clear and moist.  Eyes: Pupils are equal, round, and reactive to light. Conjunctivae and EOM are normal. No scleral icterus.  Neck: Normal range of motion. Neck supple. No JVD present. No thyromegaly present.  Cardiovascular: Normal rate, regular rhythm and intact distal pulses. Exam reveals no gallop and no friction rub.  No murmur heard. Respiratory: Effort normal and breath sounds normal. No respiratory distress. She has no wheezes. She has no rales.  GI: Soft. Bowel sounds are normal. She exhibits no distension. There is abdominal tenderness.  Musculoskeletal: Normal range of motion.        General: No edema.     Comments: No arthritis, no gout  Lymphadenopathy:    She has no cervical adenopathy.  Neurological: [name] alert and oriented to person, place, and time. No cranial nerve deficit.  No dysarthria, no aphasia  Skin: Skin is warm and dry. No rash noted. No erythema.  Psychiatric: She has a normal mood and affect. Her behavior is normal. Judgment and thought content normal.    LABORATORY PANEL:   CBC Recent Labs  Lab 01/29/18 2056  WBC 26.6*  HGB 14.6  HCT 47.3*  PLT 341   ------------------------------------------------------------------------------------------------------------------  Chemistries  Recent Labs  Lab 01/29/18 2056  NA 139  K 2.0*  CL 95*  CO2 7*  GLUCOSE  <20*  BUN 16  CREATININE 0.75  CALCIUM 8.7*  AST 70*  ALT 51*  ALKPHOS 88  BILITOT 1.0   ------------------------------------------------------------------------------------------------------------------  Cardiac Enzymes Recent Labs  Lab 01/29/18 2056  TROPONINI <0.03   ------------------------------------------------------------------------------------------------------------------  RADIOLOGY:  Dg Chest 2 View  Result Date: 01/29/2018 CLINICAL DATA:  Chest pain and nausea EXAM: CHEST - 2 VIEW COMPARISON:  07/11/2016 FINDINGS: Cardiac shadows within normal limits. The lungs are well  aerated bilaterally. No focal infiltrate or sizable effusion is seen. Degenerative changes of the thoracic spine are noted. Old rib fractures on the left are noted. IMPRESSION: No acute abnormality noted. Electronically Signed   By: Inez Catalina M.D.   On: 01/29/2018 20:59   Ct Abdomen Pelvis W Contrast  Result Date: 01/30/2018 CLINICAL DATA:  Nausea. Abdominal pain. Elevated lactate. EXAM: CT ABDOMEN AND PELVIS WITH CONTRAST TECHNIQUE: Multidetector CT imaging of the abdomen and pelvis was performed using the standard protocol following bolus administration of intravenous contrast. CONTRAST:  157m ISOVUE-300 IOPAMIDOL (ISOVUE-300) INJECTION 61% COMPARISON:  CT 08/28/2017, abdominal MRI 01/09/2018 FINDINGS: Lower chest: Scattered atelectasis without confluent airspace disease. No pleural fluid. The heart is normal in size. Hepatobiliary: Diffusely decreased hepatic density consistent with steatosis. No definite CT correlate of liver lesions on recent MRI. Gallbladder is physiologically distended, there is gallbladder wall thickening of 7 mm versus pericholecystic fluid. No calcified gallstone. No biliary dilatation. Unchanged exophytic or capsular calcification in the right lobe. Pancreas: No ductal dilatation or inflammation. Spleen: Normal in size without focal abnormality. Adrenals/Urinary Tract: Normal  adrenal glands. No hydronephrosis or perinephric edema. Homogeneous renal enhancement with symmetric excretion on delayed phase imaging. Urinary bladder is decompressed by Foley catheter. Stomach/Bowel: Small amount of fluid in the distal esophagus. Stomach physiologically distended. Probable ingested contents in the dependent stomach. Ingested contents versus mucosal hyperemia of the distal stomach. Mesenteric edema with lymph nodes in the central small bowel mesentery a but no associated small bowel wall thickening or inflammatory change. No obstruction. Air-filled normal appendix. Small to moderate colonic stool burden. Mild sigmoid diverticulosis without diverticulitis. No colonic wall thickening or inflammatory change. Vascular/Lymphatic: Normal caliber abdominal aorta. Portal and mesenteric veins are patent. Increased number of multiple small lymph nodes in the central small bowel mesentery, largest 7 mm short axis dimension with associated mesenteric edema small amount free fluid. No pelvic adenopathy. Prominent porta hepatis nodes similar to slightly decreased in size from prior CT. Reproductive: Fibroid uterus with multiple fibroids, some of which are calcified, largest posteriorly measures 4.5 cm. Tubal ligation clips. Other: Small amount of free fluid in the central mesentery. No free air. No intra-abdominal abscess. Musculoskeletal: There are no acute or suspicious osseous abnormalities. IMPRESSION: 1. Mild gallbladder wall thickening versus pericholecystic fluid. Recommend right upper quadrant ultrasound for further evaluation. 2. Increased number of small lymph nodes in the central small bowel mesentery with associated mesenteric edema and small amount of free fluid. 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 No associated small bowel abnormality. 3. Prominent porta hepatis lymph nodes, unchanged to slightly improved from prior CT, in similar to recent MRI. 4. Chronic findings of  mild colonic diverticulosis without diverticulitis. Fibroid uterus. Hepatic steatosis. Electronically Signed   By: MKeith RakeM.D.   On: 01/30/2018 00:25    EKG:   Orders placed or performed during the hospital encounter of 01/29/18  . EKG 12-Lead  . EKG 12-Lead  . ED EKG  . ED EKG    IMPRESSION AND PLAN:  Principal Problem:   SIRS (systemic inflammatory response syndrome) (HCC) -unclear source of infection at this time, though CT abdomen did show possible gallbladder pathology.  We are getting an ultrasound to clarify.  She was started on antibiotics in the ED, and cultures were sent.  Her lactic acid was severely elevated at greater than 11, IV fluids initiated will admit her to stepdown unit. Active Problems:   Hypokalemia -continuous potassium replacement with monitoring until  within normal limits   Hypoglycemia -corrected with dextrose administration, frequent monitoring for now   Lactic acidosis -aggressive IV fluids as above   Alcohol abuse -banana bag upfront, CIWA protocol   Diabetes ([place]) -patient has been hypoglycemic here and profoundly so, we will do frequent CBG checks but hold any insulin for now   HTN (hypertension) -continue home dose antihypertensives   HLD (hyperlipidemia) -continue home dose antilipid   GERD (gastroesophageal reflux disease) -continue home dose PPI  Chart review performed and case discussed with ED provider. Labs, imaging and/or ECG reviewed by provider and discussed with patient/family. Management plans discussed with the patient and/or family.  DVT PROPHYLAXIS: SubQ lovenox   GI PROPHYLAXIS:  PPI   ADMISSION STATUS: Inpatient     CODE STATUS: Full Code Status History    Date Active Date Inactive Code Status Order ID Comments User Context   08/29/2017 2314 09/01/2017 1739 Full Code [id number]  Lance Coon, MD Inpatient   01/14/2015 1214 01/17/2015 1844 Full Code [id number]  Hessie Knows, MD Inpatient      TOTAL CRITICAL CARE TIME  TAKING CARE OF THIS PATIENT: 50 minutes.   Ethlyn Daniels 01/30/2018, 12:49 AM  [facility]  Office  [phone]  CC: Primary care physician; Ellamae Sia, MD  Note:  This document was prepared using Dragon voice recognition software and may include unintentional dictation errors.

## 2018-01-30 NOTE — Progress Notes (Addendum)
Inpatient Diabetes Program Recommendations  AACE/ADA: New Consensus Statement on Inpatient Glycemic Control (2015)  Target Ranges:  Prepandial:   less than 140 mg/dL      Peak postprandial:   less than 180 mg/dL (1-2 hours)      Critically ill patients:  140 - 180 mg/dL   Results for Sheila [name], Sheila [name] (MRN [id number]) as of 01/30/2018 09:46  Ref. Range 01/29/2018 22:25 01/29/2018 23:44 01/30/2018 01:45 01/30/2018 04:17 01/30/2018 06:02 01/30/2018 07:58  Glucose-Capillary Latest Ref Range: 70 - 99 mg/dL 223 (H)  125 mg Solumedrol given 152 (H) 210 (H) 197 (H) 211 (H) 271 (H)  5 units NOVOLOG     Admit with: Profound Hypokalemia, Hypoglycemia, Severe Lactic Acidosis, Leukocytosis  History: DM, ETOH Abuse (reports drinking every day from half a pint to 1 pint of liquor)  Home DM Meds: Metformin 500 mg BID  Current Orders: Novolog Sensitive Correction Scale/ SSI (0-9 units) TID AC + HS     Note Novolog SSi started this AM.  Still has 125 mg Solumedrol on board from last PM, so CBGs may run a little higher today.  MD- Metformin likely is Not the best choice for at home diabetes management given her issues with ETOH Abuse.    May need to stop the Metformin and start a different oral diabetes medication.  Please check a current A1c to assess home glucose control.    Will follow and assist with glucose management if desired.     --Will follow patient during hospitalization--  Wyn Quaker RN, MSN, CDE Diabetes Coordinator Inpatient Glycemic Control Team Team Pager: [phone] (8a-5p)

## 2018-01-30 NOTE — Consult Note (Addendum)
PULMONARY / CRITICAL CARE MEDICINE  Name: Sheila [name] MRN: [id number] DOB: 1960/08/07    LOS: 0  Referring Provider: Dr. Jannifer Franklin Reason for Referral: Lactic acidosis   HPI: 58 year old African-American female with a medical history as indicated below plus active alcohol abuse who presented to the ED complains of nausea, chest pain, shortness of breath and palpitations that started on 01/29/2018 and gradually got worse.  She described chest pain as midsternal, nonradicular more burning in nature, and associated with nausea, epigastric pain and shortness of breath.  She cannot think of any aggravating factors.  She reports moderate relief with medications that she was given and emergency room.  At the ED, patient was altered and her work-up was significant for profound hypokalemia, hypoglycemia, severe lactic acidosis, leukocytosis with a WBC of 26.6K and elevated LFTs.  Her CT abdomen showed mild gallbladder wall thickening small mesenteric lymph nodes, hepatic steatosis and diverticulosis. She was started on IV fluids and electrolytes and admitted to the ICU for further management. She reports drinking every day from half a pint to 1 pint of liquor.  Denies any previous history of withdrawal so alcohol withdrawal seizures.  Past Medical History:  Diagnosis Date  . Anemia   . Arthritis   . Asthma   . COPD (chronic obstructive pulmonary disease) ([place])   . DDD (degenerative disc disease), lumbar   . Diabetes mellitus without complication ([place])    on metformin  . DJD (degenerative joint disease)   . Dysrhythmia   . Fatty liver   . Fibroids   . GERD (gastroesophageal reflux disease)   . Glaucoma   . Hyperlipidemia   . Hypertension   . IBS (irritable bowel syndrome)   . Lumbago   . Neuromuscular disorder ([place])   . 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[place]) 03/8880  . Renal disorder   . Ulcerative colitis ([place])    on mesalamine   Past Surgical History:  Procedure Laterality Date  .  COLONOSCOPY WITH PROPOFOL N/A 08/19/2015   Procedure: COLONOSCOPY WITH PROPOFOL;  Surgeon: Lollie Sails, MD;  Location: [facility] ENDOSCOPY;  Service: Endoscopy;  Laterality: N/A;  . COLONOSCOPY WITH PROPOFOL N/A 12/08/2015   Procedure: COLONOSCOPY WITH PROPOFOL;  Surgeon: Lollie Sails, MD;  Location: [facility] ENDOSCOPY;  Service: Endoscopy;  Laterality: N/A;  . COLONOSCOPY WITH PROPOFOL N/A 12/09/2015   Procedure: COLONOSCOPY WITH PROPOFOL;  Surgeon: Lollie Sails, MD;  Location: [facility] ENDOSCOPY;  Service: Endoscopy;  Laterality: N/A;  . ESOPHAGOGASTRODUODENOSCOPY (EGD) WITH PROPOFOL N/A 11/08/2017   Procedure: ESOPHAGOGASTRODUODENOSCOPY (EGD) WITH PROPOFOL;  Surgeon: Lollie Sails, MD;  Location: [facility] ENDOSCOPY;  Service: Endoscopy;  Laterality: N/A;  . JOINT REPLACEMENT Left 04/19/2006  . JOINT REPLACEMENT Right 01/14/2015  . TOTAL KNEE ARTHROPLASTY Left 2008  . TOTAL KNEE ARTHROPLASTY Right 01/14/2015   Procedure: TOTAL KNEE ARTHROPLASTY;  Surgeon: Hessie Knows, MD;  Location: [facility] ORS;  Service: Orthopedics;  Laterality: Right;   Prior to Admission medications   Medication Sig Start Date End Date Taking? Authorizing Provider  amLODipine (NORVASC) 5 MG tablet Take 5 mg by mouth daily.   Yes [provider]  clopidogrel (PLAVIX) 75 MG tablet Take 75 mg by mouth daily.   Yes [provider]  donepezil (ARICEPT) 5 MG tablet Take 1 tablet (5 mg total) by mouth at bedtime. 08/29/17 10/08/17 Yes Sowles, Drue Stager, MD  empagliflozin (JARDIANCE) 25 MG TABS tablet Take 25 mg by mouth daily.   Yes [provider]  glycopyrrolate (ROBINUL) 1 MG tablet Take  1 mg by mouth 2 (two) times daily.   Yes [provider]  insulin aspart (NOVOLOG FLEXPEN) 100 UNIT/ML FlexPen Inject 12 Units into the skin 2 (two) times daily.   Yes [provider]  insulin aspart (NOVOLOG) 100 UNIT/ML FlexPen Inject 18 Units into the skin daily. At 1700   Yes [provider]   Insulin Degludec-Liraglutide (XULTOPHY) 100-3.6 UNIT-MG/ML SOPN Inject 50 Units into the skin daily.   Yes [provider]  levETIRAcetam (KEPPRA) 500 MG tablet Take 500 mg by mouth 2 (two) times daily.   Yes [provider]  lipase/protease/amylase (CREON) 12000 units CPEP capsule Take 6,000 Units by mouth 3 (three) times daily before meals.   Yes [provider]  lipase/protease/amylase (CREON) 12000 units CPEP capsule Take 3,000 Units by mouth at bedtime. With snack   Yes [provider]  lisinopril (PRINIVIL,ZESTRIL) 5 MG tablet Take 5 mg by mouth daily.   Yes [provider]  metoprolol succinate (TOPROL-XL) 25 MG 24 hr tablet Take 1 tablet (25 mg total) by mouth daily. 08/29/17  Yes Sowles, Drue Stager, MD  rosuvastatin (CRESTOR) 40 MG tablet Take 1 tablet (40 mg total) by mouth daily. 08/29/17 10/08/17 Yes Steele Sizer, MD  aspirin EC 81 MG tablet Take 81 mg by mouth daily.    [provider]  famotidine (PEPCID) 20 MG tablet Take 1 tablet (20 mg total) by mouth 2 (two) times daily. 08/29/17 09/28/17  Steele Sizer, MD  gabapentin (NEURONTIN) 300 MG capsule Take 1 capsule (300 mg total) by mouth 2 (two) times daily. 08/29/17 09/28/17  Steele Sizer, MD  insulin glargine (LANTUS) 100 UNIT/ML injection Inject 0.1 mLs (10 Units total) into the skin daily. 08/29/17 09/28/17  Steele Sizer, MD  lacosamide 100 MG TABS Take 1 tablet (100 mg total) by mouth 2 (two) times daily. Patient not taking: Reported on 10/08/2017 01/14/17   Fritzi Mandes, MD  promethazine (PHENERGAN) 12.5 MG tablet Take 1 tablet (12.5 mg total) by mouth every 6 (six) hours as needed for nausea or vomiting. Patient not taking: Reported on 10/08/2017 11/02/16   Stark Klein, MD  sertraline (ZOLOFT) 25 MG tablet Take 1 tablet (25 mg total) by mouth daily. Patient not taking: Reported on 10/08/2017 08/29/17   Steele Sizer, MD   Allergies Allergies  Allergen Reactions  .  Ampicillin Anaphylaxis  . Erythromycin Anaphylaxis  . Omeprazole Anaphylaxis  . Penicillins Anaphylaxis and Other (See Comments)    Has patient had a PCN reaction causing immediate rash, facial/tongue/throat swelling, SOB or lightheadedness with hypotension: Yes Has patient had a PCN reaction causing severe rash involving mucus membranes or skin necrosis: No Has patient had a PCN reaction that required hospitalization: Unknown Has patient had a PCN reaction occurring within the last 10 years: Yes If all of the above answers are "NO", then may proceed with Cephalosporin use.   . Quinapril Cough  . Triamterene-Hctz Other (See Comments)    Family History Family History  Problem Relation Age of Onset  . Hypertension Mother   . Diabetes Father   . Hypertension Father   . Diabetes Brother   . Hypertension Brother   . Breast cancer Neg Hx    Social History  reports that she quit smoking about 40 years ago. She has never used smokeless tobacco. She reports current alcohol use. She reports that she does not use drugs.  Review Of Systems:   Constitutional: Negative for fever and chills.  HENT: Negative for congestion and  rhinorrhea.  Eyes: Negative for redness and visual disturbance.  Respiratory: Positive for shortness of breath but negative for wheezing and cough.  Cardiovascular: Positive for chest pain but negative for palpitations.  Gastrointestinal: Positive for nausea and abdominal pain but negative for vomiting and loose stools Genitourinary: Negative for dysuria and urgency.  Endocrine: Denies polyuria, polyphagia and heat intolerance Musculoskeletal: Negative for myalgias and arthralgias.  Skin: Negative for pallor and wound.  Neurological: Negative for dizziness but positive for headache   VITAL SIGNS: BP 125/75 (BP Location: Left Arm)   Pulse 95   Temp 97.9 F (36.6 C) (Oral)   Resp (!) 21   Ht 5' (1.524 m)   Wt 75.8 kg   LMP  (LMP Unknown) Comment: LMP was in her  79's, greater than 2 years ago.  SpO2 97%   BMI 32.61 kg/m   HEMODYNAMICS:    VENTILATOR SETTINGS:    INTAKE / OUTPUT: No intake/output data recorded.  PHYSICAL EXAMINATION: General: Well-nourished, well-developed, in no acute distress HEENT: PERRLA, no JVD Neuro: Alert and oriented x3, no focal deficit Cardiovascular: Apical pulse regular, S1-S2, no murmur regurg or gallop, +2 pulses bilaterally no edema Lungs: Clear to auscultation bilaterally Abdomen: Mildly distended, normal bowel sounds in all 4 quadrants, palpation reveals a gastric tender, and mild increase in liver span Musculoskeletal: Positive range of motion in upper and lower extremities, no deformities Skin: Warm and dry  LABS:  BMET Recent Labs  Lab 01/29/18 2056 01/30/18 0150  NA 139 138  K 2.0* 2.8*  CL 95* 101  CO2 7* 9*  BUN 16 16  CREATININE 0.75 0.75  GLUCOSE <20* 228*    Electrolytes Recent Labs  Lab 01/29/18 2056 01/30/18 0150  CALCIUM 8.7* 7.8*  MG  --  1.8    CBC Recent Labs  Lab 01/29/18 2056 01/30/18 0150  WBC 26.6* 18.0*  HGB 14.6 14.3  HCT 47.3* 47.4*  PLT 341 260    Coag's No results for input(s): APTT, INR in the last 168 hours.  Sepsis Markers Recent Labs  Lab 01/29/18 2210 01/30/18 0333  LATICACIDVEN >11.0* 9.4*    ABG No results for input(s): PHART, PCO2ART, PO2ART in the last 168 hours.  Liver Enzymes Recent Labs  Lab 01/29/18 2056 01/30/18 0150  AST 70* 63*  ALT 51* 45*  ALKPHOS 88 73  BILITOT 1.0 1.4*  ALBUMIN 4.7 4.0    Cardiac Enzymes Recent Labs  Lab 01/29/18 2056 01/30/18 0333  TROPONINI <0.03 <0.03    Glucose Recent Labs  Lab 01/29/18 2225 01/29/18 2344 01/30/18 0145 01/30/18 0417  GLUCAP 223* 152* 210* 197*    Imaging Dg Chest 2 View  Result Date: 01/29/2018 CLINICAL DATA:  Chest pain and nausea EXAM: CHEST - 2 VIEW COMPARISON:  07/11/2016 FINDINGS: Cardiac shadows within normal limits. The lungs are well aerated  bilaterally. No focal infiltrate or sizable effusion is seen. Degenerative changes of the thoracic spine are noted. Old rib fractures on the left are noted. IMPRESSION: No acute abnormality noted. Electronically Signed   By: Inez Catalina M.D.   On: 01/29/2018 20:59   Ct Abdomen Pelvis W Contrast  Result Date: 01/30/2018 CLINICAL DATA:  Nausea. Abdominal pain. Elevated lactate. EXAM: CT ABDOMEN AND PELVIS WITH CONTRAST TECHNIQUE: Multidetector CT imaging of the abdomen and pelvis was performed using the standard protocol following bolus administration of intravenous contrast. CONTRAST:  130m ISOVUE-300 IOPAMIDOL (ISOVUE-300) INJECTION 61% COMPARISON:  CT 08/28/2017, abdominal MRI 01/09/2018 FINDINGS: Lower chest: Scattered atelectasis  without confluent airspace disease. No pleural fluid. The heart is normal in size. Hepatobiliary: Diffusely decreased hepatic density consistent with steatosis. No definite CT correlate of liver lesions on recent MRI. Gallbladder is physiologically distended, there is gallbladder wall thickening of 7 mm versus pericholecystic fluid. No calcified gallstone. No biliary dilatation. Unchanged exophytic or capsular calcification in the right lobe. Pancreas: No ductal dilatation or inflammation. Spleen: Normal in size without focal abnormality. Adrenals/Urinary Tract: Normal adrenal glands. No hydronephrosis or perinephric edema. Homogeneous renal enhancement with symmetric excretion on delayed phase imaging. Urinary bladder is decompressed by Foley catheter. Stomach/Bowel: Small amount of fluid in the distal esophagus. Stomach physiologically distended. Probable ingested contents in the dependent stomach. Ingested contents versus mucosal hyperemia of the distal stomach. Mesenteric edema with lymph nodes in the central small bowel mesentery a but no associated small bowel wall thickening or inflammatory change. No obstruction. Air-filled normal appendix. Small to moderate colonic stool  burden. Mild sigmoid diverticulosis without diverticulitis. No colonic wall thickening or inflammatory change. Vascular/Lymphatic: Normal caliber abdominal aorta. Portal and mesenteric veins are patent. Increased number of multiple small lymph nodes in the central small bowel mesentery, largest 7 mm short axis dimension with associated mesenteric edema small amount free fluid. No pelvic adenopathy. Prominent porta hepatis nodes similar to slightly decreased in size from prior CT. Reproductive: Fibroid uterus with multiple fibroids, some of which are calcified, largest posteriorly measures 4.5 cm. Tubal ligation clips. Other: Small amount of free fluid in the central mesentery. No free air. No intra-abdominal abscess. Musculoskeletal: There are no acute or suspicious osseous abnormalities. IMPRESSION: 1. Mild gallbladder wall thickening versus pericholecystic fluid. Recommend right upper quadrant ultrasound for further evaluation. 2. Increased number of small lymph nodes in the central small bowel mesentery with associated mesenteric edema and small amount of free fluid. 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 No associated small bowel abnormality. 3. Prominent porta hepatis lymph nodes, unchanged to slightly improved from prior CT, in similar to recent MRI. 4. Chronic findings of mild colonic diverticulosis without diverticulitis. Fibroid uterus. Hepatic steatosis. Electronically Signed   By: Keith Rake M.D.   On: 01/30/2018 00:25   US Abdomen Limited Ruq  Result Date: 01/30/2018 CLINICAL DATA:  Abdominal pain EXAM: ULTRASOUND ABDOMEN LIMITED RIGHT UPPER QUADRANT COMPARISON:  CT abdomen pelvis 01/29/2018 MRI abdomen 01/09/2018 FINDINGS: Gallbladder: No cholelithiasis. Hypoechoic region anterior to the gallbladder measures 6 mm in thickness. A negative sonographic Percell Miller sign was reported by the sonographer. Common bile duct: Diameter: 5 mm Liver: Hyperechoic liver. No focal lesion.  Portal vein is patent on color Doppler imaging with normal direction of blood flow towards the liver. IMPRESSION: 1. Hypoechoic area adjacent to the gallbladder, corresponding to the finding on earlier CT. This is favored to be a small pericholecystic fluid collection. No cholelithiasis or other evidence of acute cholecystitis. 2. Hyperechoic liver may indicate hepatic steatosis. Electronically Signed   By: Ulyses Jarred M.D.   On: 01/30/2018 03:09   STUDIES:  Hepatic ultrasound  CULTURES: Blood cultures x2 Urine culture  ANTIBIOTICS: Given vancomycin and cefepime in the ED We will start empirically on Cipro and Flagyl for intra-abdominal infection SIGNIFICANT EVENTS: 01/30/2018: Admitted  LINES/TUBES: Peripheral IVs  DISCUSSION: This 58 year old female presenting with acute alcohol intoxication, severe lactic acidosis secondary to alcohol abuse, multiple electrolyte abnormalities secondary to alcohol abuse and possible intra-abdominal infection.  ASSESSMENT  Acute alcohol intoxication and alcohol abuse Severe lactic acidosis Multiple electrolyte abnormalities: Hypoglycemia, and hypokalemia, hypomagnesemia Rule  out intra-abdominal infection Type 2 diabetes mellitus  PLAN Hemodynamic monitoring per ICU protocol Banana bag x1 Thiamine and folic acid Bicarb infusion Hepatic ultrasound Replace electrolytes Empiric antibiotics as above Follow-up cultures and monitor fever curve Trend procalcitonin and adjust antibiotics Blood glucose monitoring with sliding scale insulin coverage if hypoglycemic Resume all home medications Trend lactic acid, and LFTs [place] protocol   Best Practice: Code Status: Full code Diet: Modified diet GI prophylaxis: Famotidine VTE prophylaxis: Subcu Lovenox  FAMILY  - Updates: Patient and daughter updated on current treatment plan at bedside Shavar Gorka S. [facility] ANP-BC Pulmonary and Critical Care Medicine [facility] Pager [phone]  or [phone]  NB: This document was prepared using Dragon voice recognition software and may include unintentional dictation errors.    01/30/2018, 5:44 AM

## 2018-01-30 NOTE — Progress Notes (Signed)
Ch visited with pt and family present. Pt was in an upbeat mood as we joked about how well the pt kept her hair while being in the hospital. Pt mentioned that her insides were "all over the place", but rejoiced in knowing that God has kept her here. Ch affirmed that God has given her a second wind for her journey. Pt shared that she cares for kids and loves what she does. Pt was thankful for the visit of the ch and she hopes to go home Wednesday.    01/30/18 1300  Clinical Encounter Type  Visited With Patient and family together  Visit Type Initial;Psychological support;Spiritual support;Social support  Referral From Chaplain  Consult/Referral To Chaplain  Spiritual Encounters  Spiritual Needs Emotional;Grief support  Stress Factors  Patient Stress Factors Health changes;Major life changes  Family Stress Factors None identified

## 2018-01-30 NOTE — Consult Note (Addendum)
SURGICAL CONSULTATION NOTE (initial) - cpt: 24097  Patient seen and examined as described below with surgical PA-C, Ardell Isaacs.  Additionally,  HIDA Hepatobiliary Nuclear Medicine Scan (01/30/2018) Prompt uptake and biliary excretion of activity by the liver is seen. Gallbladder activity is visualized, consistent with patency  of cystic duct. Biliary activity passes into small bowel, consistent with patent common bile duct. Negative HIDA study for cystic  duct obstruction/acute calculus cholecystitis.  Assessment/Plan: (ICD-10's: K70.9) In summary, patient is a 58 y.o. female with a small amount of pericholecystic fluid without cholelithiasis, cholecystitis, or current abdominal pain following resolution of leukocytosis, electrolyte abnormalities, and profound lactic acidosis, complicated by pertinent comorbidities including obesity (BMI 33), DM, HTN, HLD, COPD, ulcerative colitis, fatty liver disease, current ongoing alcohol abuse/dependence, and former tobacco abuse (smoking).    - HIDA results reviewed  - no indication for surgical intervention at this time   - advance diet as tolerated if okay with primary medical team  - medical management as per primary medical team  - will sign off, please call if any questions/concerns  I have personally reviewed the patient's chart, evaluated/examined the patient, proposed the recommended management, and discussed these recommendations with the patient and her family to their expressed satisfaction as well as with patient's RN and medical physician.  Thank you for the opportunity to participate in this patient's care.  -- Marilynne Drivers Rosana Hoes, MD, [place]: [facility] - Partnering for exceptional care. Office: [phone]  [facility] SURGICAL CONSULTATION NOTE (initial) - cpt: 99254   HISTORY OF PRESENT ILLNESS (HPI):  58 y.o. female presented to [facility] ED on 01/29/2018 for  evaluation of chest pain. Per chart review, patient's daughter noted that her mother has called and noted that she did not feel well on the day of presentation to the ED. She was complaining of burning epigastric/chest pain, nausea, emesis, and cough. She has a strong history of alcohol abuse and reportedly continues to drink. No complaints of fever, chills, or SOB. History was limited in the ED due to altered mental status. However, work up was concerning for severe hypoglycemia, severe hypokalemia, severe lactic acidosis, leukocytosis,   Today, she reports that her abdominal pain and confusion have improved. [name] up eating a carb-modified diet this morning without issue. No complaints of fever, chills, nausea, emesis, or bowel changes. Endorses passing flatus and having a BM. Of note, she endorses drinking "a full pint of liquor when Im in a good mood and a half a pint if Im not." She did not specific quantify how often but she did not endorse this as a daily thing. Additionally, she reports today that her family physician had taken her off her Lasix and K+ replacement because she believes she was having potassium issues but her feet started to swell that she started her Lasix alone recently.   Surgery is consulted by hospitalist physician Dr. Max Sane, MD in this context for evaluation and management of abdominal pain and abnormal lab values with concern for possible cholecystitis.   PAST MEDICAL HISTORY (PMH):  Past Medical History:  Diagnosis Date  . Anemia   . Arthritis   . Asthma   . COPD (chronic obstructive pulmonary disease) ([place])   . DDD (degenerative disc disease), lumbar   . Diabetes mellitus without complication ([place])    on metformin  . DJD (degenerative joint disease)   . Dysrhythmia   . Fatty liver   . Fibroids   .  GERD (gastroesophageal reflux disease)   . Glaucoma   . Hyperlipidemia   . Hypertension   . IBS (irritable bowel syndrome)   . Lumbago   . Neuromuscular  disorder ([place])   . 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[place]) 14/7829  . Renal disorder   . Ulcerative colitis ([place])    on mesalamine     PAST SURGICAL HISTORY ([place]):  Past Surgical History:  Procedure Laterality Date  . COLONOSCOPY WITH PROPOFOL N/A 08/19/2015   Procedure: COLONOSCOPY WITH PROPOFOL;  Surgeon: Lollie Sails, MD;  Location: [facility] ENDOSCOPY;  Service: Endoscopy;  Laterality: N/A;  . COLONOSCOPY WITH PROPOFOL N/A 12/08/2015   Procedure: COLONOSCOPY WITH PROPOFOL;  Surgeon: Lollie Sails, MD;  Location: [facility] ENDOSCOPY;  Service: Endoscopy;  Laterality: N/A;  . COLONOSCOPY WITH PROPOFOL N/A 12/09/2015   Procedure: COLONOSCOPY WITH PROPOFOL;  Surgeon: Lollie Sails, MD;  Location: [facility] ENDOSCOPY;  Service: Endoscopy;  Laterality: N/A;  . ESOPHAGOGASTRODUODENOSCOPY (EGD) WITH PROPOFOL N/A 11/08/2017   Procedure: ESOPHAGOGASTRODUODENOSCOPY (EGD) WITH PROPOFOL;  Surgeon: Lollie Sails, MD;  Location: [facility] ENDOSCOPY;  Service: Endoscopy;  Laterality: N/A;  . JOINT REPLACEMENT Left 04/19/2006  . JOINT REPLACEMENT Right 01/14/2015  . TOTAL KNEE ARTHROPLASTY Left 2008  . TOTAL KNEE ARTHROPLASTY Right 01/14/2015   Procedure: TOTAL KNEE ARTHROPLASTY;  Surgeon: Hessie Knows, MD;  Location: [facility] ORS;  Service: Orthopedics;  Laterality: Right;     MEDICATIONS:  Prior to Admission medications   Medication Sig Start Date End Date Taking? Authorizing Provider  albuterol (PROVENTIL HFA;VENTOLIN HFA) 108 (90 BASE) MCG/ACT inhaler Inhale 2 puffs into the lungs every 4 (four) hours as needed for wheezing or shortness of breath.   Yes [provider]  chlorthalidone (HYGROTON) 25 MG tablet Take 25 mg by mouth daily.  01/04/15  Yes [provider]  [employer] VITAMIN B12 1000 MCG tablet Take 1,000 mcg by mouth daily. 07/20/17  Yes [provider]  dorzolamide (TRUSOPT) 2 % ophthalmic solution Place 1 drop into both eyes 2 (two) times daily. 07/17/17  Yes [provider]   KLOR-CON M20 20 MEQ tablet Take 40 mEq by mouth daily.  01/01/15  Yes [provider]  latanoprost (XALATAN) 0.005 % ophthalmic solution Place 1 drop into both eyes at bedtime. 01/01/18  Yes [provider]  losartan (COZAAR) 25 MG tablet Take 25 mg by mouth daily.    Yes [provider]  mesalamine (APRISO) 0.375 G 24 hr capsule Take 1,500 mg by mouth daily.    Yes [provider]  metFORMIN (GLUCOPHAGE-XR) 500 MG 24 hr tablet Take 500 mg by mouth 2 (two) times daily.    Yes [provider]  metoprolol succinate (TOPROL-XL) 50 MG 24 hr tablet Take 50 mg by mouth at bedtime. 01/17/18  Yes [provider]  pantoprazole (PROTONIX) 40 MG tablet Take 40 mg by mouth every morning.   Yes [provider]  pravastatin (PRAVACHOL) 10 MG tablet Take 10 mg by mouth at bedtime.   Yes [provider]  sucralfate (CARAFATE) 1 g tablet Take 1 g by mouth 2 (two) times daily.   Yes [provider]  allopurinol (ZYLOPRIM) 100 MG tablet Take 100 mg by mouth daily. 08/04/17   [provider]  bimatoprost (LUMIGAN) 0.03 % ophthalmic solution Place 1 drop into both eyes at bedtime.    [provider]  colchicine 0.6 MG tablet Take 0.6 mg by mouth daily.    [provider]  gabapentin (NEURONTIN) 300 MG  capsule Take 300 mg by mouth 3 (three) times daily.    [provider]  Magnesium Oxide (MAG-OXIDE) 200 MG TABS Take 2 tablets (400 mg total) by mouth 2 (two) times daily. 09/01/17   Dustin Flock, MD  metoprolol succinate (TOPROL-XL) 25 MG 24 hr tablet Take 1 tablet (25 mg total) by mouth daily. Patient not taking: Reported on 01/29/2018 01/17/15   Duanne Guess, PA-C  ondansetron (ZOFRAN) 4 MG tablet Take 1 tablet (4 mg total) by mouth every 8 (eight) hours as needed for nausea or vomiting. 08/28/17   Schuyler Amor, MD  ondansetron (ZOFRAN) 4 MG tablet Take 1 tablet (4 mg total) by mouth every 6 (six)  hours as needed for nausea. 09/01/17   Dustin Flock, MD     ALLERGIES:  Allergies  Allergen Reactions  . Ampicillin Anaphylaxis  . Erythromycin Anaphylaxis  . Omeprazole Anaphylaxis  . Penicillins Anaphylaxis and Other (See Comments)    Has patient had a PCN reaction causing immediate rash, facial/tongue/throat swelling, SOB or lightheadedness with hypotension: Yes Has patient had a PCN reaction causing severe rash involving mucus membranes or skin necrosis: No Has patient had a PCN reaction that required hospitalization: Unknown Has patient had a PCN reaction occurring within the last 10 years: Yes If all of the above answers are "NO", then may proceed with Cephalosporin use.   . Quinapril Cough  . Triamterene-Hctz Other (See Comments)     SOCIAL HISTORY:  Social History   Socioeconomic History  . Marital status: Single    Spouse name: Not on file  . Number of children: Not on file  . Years of education: Not on file  . Highest education level: Not on file  Occupational History  . Not on file  Social Needs  . Financial resource strain: Not on file  . Food insecurity:    Worry: Not on file    Inability: Not on file  . Transportation needs:    Medical: Not on file    Non-medical: Not on file  Tobacco Use  . Smoking status: Former Smoker    Last attempt to quit: 01/04/1978    Years since quitting: 40.0  . Smokeless tobacco: Never Used  Substance and Sexual Activity  . Alcohol use: Yes    Comment: Recovering alcoholic since 19/5093  . Drug use: No    Comment: used cocaine, quit in 2003  . Sexual activity: Not on file  Lifestyle  . Physical activity:    Days per week: Not on file    Minutes per session: Not on file  . Stress: Not on file  Relationships  . Social connections:    Talks on phone: Not on file    Gets together: Not on file    Attends religious service: Not on file    Active member of club or organization: Not on file    Attends meetings of clubs or  organizations: Not on file    Relationship status: Not on file  . Intimate partner violence:    Fear of current or ex partner: Not on file    Emotionally abused: Not on file    Physically abused: Not on file    Forced sexual activity: Not on file  Other Topics Concern  . Not on file  Social History Narrative  . Not on file     FAMILY HISTORY:  Family History  Problem Relation Age of Onset  . Hypertension Mother   . Diabetes Father   .  Hypertension Father   . Diabetes Brother   . Hypertension Brother   . Breast cancer Neg Hx       REVIEW OF SYSTEMS:  Review of Systems  Constitutional: Negative for chills, fever and weight loss.  Respiratory: Negative for cough and shortness of breath.   Cardiovascular: Negative for chest pain and palpitations.  Gastrointestinal: Positive for abdominal pain. Negative for blood in stool, constipation, diarrhea, nausea and vomiting.  Genitourinary: Negative for dysuria and hematuria.  Neurological: Negative for dizziness and headaches.  All other systems reviewed and are negative.   VITAL SIGNS:  Temp:  [97.4 F (36.3 C)-98.2 F (36.8 C)] 98.2 F (36.8 C) (01/27 1309) Pulse Rate:  [88-101] 94 (01/27 1309) Resp:  [18-28] 18 (01/27 1309) BP: (117-149)/(58-111) 131/64 (01/27 1309) SpO2:  [89 %-100 %] 98 % (01/27 1309) Weight:  [75.8 kg] 75.8 kg (01/26 2017)     Height: 5' (152.4 cm) Weight: 75.8 kg BMI (Calculated): 32.62   INTAKE/OUTPUT:  This shift: Total I/O In: 358.8 [I.V.:158.8; IV Piggyback:200] Out: 650 [Urine:650]  Last 2 shifts: @IOLAST2SHIFTS @   PHYSICAL EXAM:  Physical Exam Vitals signs and nursing note reviewed.  Constitutional:      General: [name] not in acute distress.    Appearance: [name] well-developed. [name] obese. [name] not ill-appearing.     Comments: Sitting up in bed eating lunch tray  HENT:     Head: Normocephalic and atraumatic.  Eyes:     General: No scleral icterus.    Conjunctiva/sclera:  Conjunctivae normal.  Cardiovascular:     Rate and Rhythm: Normal rate and regular rhythm.     Pulses: Normal pulses.     Heart sounds: No murmur. No friction rub. No gallop.   Pulmonary:     Effort: Pulmonary effort is normal. No respiratory distress.     Breath sounds: Normal breath sounds. No wheezing or rhonchi.  Abdominal:     General: Abdomen is protuberant. There is no distension.     Palpations: Abdomen is soft. There is no shifting dullness, fluid wave or hepatomegaly.     Tenderness: There is abdominal tenderness in the right upper quadrant and epigastric area. There is no guarding or rebound. Negative signs include Murphy's sign.  Genitourinary:    Comments: Deferred Skin:    General: Skin is warm.     Coloration: Skin is not jaundiced or pale.  Neurological:     General: No focal deficit present.     Mental Status: [name] alert and oriented to person, place, and time.  Psychiatric:        Mood and Affect: Mood normal.        Behavior: Behavior normal.       Labs:  CBC Latest Ref Rng & Units 01/30/2018 01/29/2018 08/31/2017  WBC 4.0 - 10.5 K/uL 18.0(H) 26.6(H) 12.7(H)  Hemoglobin 12.0 - 15.0 g/dL 14.3 14.6 10.5(L)  Hematocrit 36.0 - 46.0 % 47.4(H) 47.3(H) 31.8(L)  Platelets 150 - 400 K/uL 260 341 339   CMP Latest Ref Rng & Units 01/30/2018 01/30/2018 01/29/2018  Glucose 70 - 99 mg/dL 223(H) 228(H) <20(LL)  BUN 6 - 20 mg/dL 16 16 16   Creatinine 0.44 - 1.00 mg/dL 0.81 0.75 0.75  Sodium 135 - 145 mmol/L 132(L) 138 139  Potassium 3.5 - 5.1 mmol/L 3.2(L) 2.8(L) 2.0(LL)  Chloride 98 - 111 mmol/L 98 101 95(L)  CO2 22 - 32 mmol/L 16(L) 9(L) 7(L)  Calcium 8.9 - 10.3 mg/dL 7.1(L) 7.8(L)  8.7(L)  Total Protein 6.5 - 8.1 g/dL - 7.5 8.9(H)  Total Bilirubin 0.3 - 1.2 mg/dL - 1.4(H) 1.0  Alkaline Phos 38 - 126 U/L - 73 88  AST 15 - 41 U/L - 63(H) 70(H)  ALT 0 - 44 U/L - 45(H) 51(H)     Imaging studies:   MRCP (01/09/2018) personally reviewed and radiologist report reviewed:   IMPRESSION: 1. No MR findings to explain the patient's history of abdominal pain with nausea and vomiting. 2. Small hypervascular liver lesions, indeterminate on today's exam. While likely benign, consider follow-up MRI in 6 months to ensure stability. 3. No substantial change and borderline to mild lymphadenopathy in the porta hepatis, likely reactive. These could also be assessed at the time of follow-up.   CT Abdomen/Pelvis (01/29/2018) personally reviewed and radiologist report reviewed:  IMPRESSION: 1. Mild gallbladder wall thickening versus pericholecystic fluid. Recommend right upper quadrant ultrasound for further evaluation. 2. Increased number of small lymph nodes in the central small bowel mesentery with associated mesenteric edema and small amount of free fluid. 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 No associated small bowel abnormality. 3. Prominent porta hepatis lymph nodes, unchanged to slightly improved from prior CT, in similar to recent MRI. 4. Chronic findings of mild colonic diverticulosis without diverticulitis. Fibroid uterus. Hepatic steatosis.   RUQ [place] (01/30/2018) personally reviewed and radiologist report reviewed:  IMPRESSION: 1. Hypoechoic area adjacent to the gallbladder, corresponding to the finding on earlier CT. This is favored to be a small pericholecystic fluid collection. No cholelithiasis or other evidence of acute cholecystitis. 2. Hyperechoic liver may indicate hepatic steatosis.    Assessment/Plan: (ICD-10's: R10.11) 58 y.o. female with improving RUQ/epigastric abdominal pain, leukocytosis, electrolyte imbalance, and lactic acidosis who was found to have pericholecystic fluid collection without lithiasis or cystitis, complicated by pertinent comorbidities including a history of anemia, COPD, DM on metformin, history of fatty liver by [place], HLD, HTN, ulcerative colitis, obesity, former tobacco abuse (smoking), and current alcohol  abuse.    - Recommend obtaining HIDA without EF to rule out cholecystitis, will need to be NPO for this.   - Okay for diet after HIDA  - pain control as needed (minimize narcotics)  - Continue to correct electrolytes and monitor, monitor lactic acidosis, monitor leukocytosis  - Appreciate GI input and recommendation  - medical management per primary team otherwise   All of the above findings and recommendations were discussed with the patient and her family, and all of patient's and her family's questions were answered to their expressed satisfaction.  Thank you for the opportunity to participate in this patient's care.   -- Edison Simon, PA-C [facility] 01/30/2018, 1:46 PM [phone] M-F: 7am - 4pm

## 2018-01-31 LAB — POTASSIUM: Potassium: 2.8 mmol/L — ABNORMAL LOW (ref 3.5–5.1)

## 2018-01-31 LAB — BASIC METABOLIC PANEL
Anion gap: 10 (ref 5–15)
BUN: 14 mg/dL (ref 6–20)
CHLORIDE: 93 mmol/L — AB (ref 98–111)
CO2: 35 mmol/L — ABNORMAL HIGH (ref 22–32)
Calcium: 7.9 mg/dL — ABNORMAL LOW (ref 8.9–10.3)
Creatinine, Ser: 0.58 mg/dL (ref 0.44–1.00)
GFR calc Af Amer: >60 mL/min (ref >60)
GFR calc non Af Amer: >60 mL/min (ref >60)
Glucose, Bld: 282 mg/dL — ABNORMAL HIGH (ref 70–99)
POTASSIUM: 2.4 mmol/L — AB (ref 3.5–5.1)
Sodium: 138 mmol/L (ref 135–145)

## 2018-01-31 LAB — URINE CULTURE: CULTURE: NO GROWTH

## 2018-01-31 LAB — GLUCOSE, CAPILLARY
GLUCOSE-CAPILLARY: 255 mg/dL — AB (ref 70–99)
Glucose-Capillary: 120 mg/dL — ABNORMAL HIGH (ref 70–99)

## 2018-01-31 LAB — MAGNESIUM: MAGNESIUM: 2.4 mg/dL (ref 1.7–2.4)

## 2018-01-31 LAB — ALBUMIN: Albumin: 3.7 g/dL (ref 3.5–5.0)

## 2018-01-31 LAB — PHOSPHORUS
Phosphorus: <1 mg/dL — CL (ref 2.5–4.6)
Phosphorus: <1 mg/dL — CL (ref 2.5–4.6)

## 2018-01-31 LAB — PROCALCITONIN: Procalcitonin: <0.1 ng/mL

## 2018-01-31 MED ORDER — POTASSIUM CHLORIDE CRYS ER 20 MEQ PO TBCR
40.0000 meq | EXTENDED_RELEASE_TABLET | ORAL | Status: AC
  Administered 2018-01-31 (×2): 40 meq via ORAL
  Filled 2018-01-31 (×2): qty 2

## 2018-01-31 MED ORDER — LINAGLIPTIN 5 MG PO TABS: 5.0000 mg | ORAL_TABLET | Freq: Every day | ORAL | 0 refills | Status: DC

## 2018-01-31 MED ORDER — POTASSIUM PHOSPHATE MONOBASIC 500 MG PO TABS
500.0000 mg | ORAL_TABLET | Freq: Three times a day (TID) | ORAL | Status: DC
  Administered 2018-01-31 (×2): 500 mg via ORAL
  Filled 2018-01-31 (×4): qty 1

## 2018-01-31 MED ORDER — POTASSIUM CHLORIDE CRYS ER 20 MEQ PO TBCR
40.0000 meq | EXTENDED_RELEASE_TABLET | Freq: Once | ORAL | Status: AC
  Administered 2018-01-31: 40 meq via ORAL
  Filled 2018-01-31: qty 2

## 2018-01-31 MED ORDER — POTASSIUM PHOSPHATE MONOBASIC 500 MG PO TABS: 500.0000 mg | ORAL_TABLET | Freq: Three times a day (TID) | ORAL | 0 refills | Status: DC

## 2018-01-31 MED ORDER — KLOR-CON M20 20 MEQ PO TBCR: 40.0000 meq | EXTENDED_RELEASE_TABLET | Freq: Every day | ORAL | 0 refills | Status: DC

## 2018-01-31 NOTE — Discharge Instructions (Signed)
Hypokalemia Hypokalemia means that the amount of potassium in the blood is lower than normal.Potassium is a chemical that helps regulate the amount of fluid in the body (electrolyte). It also stimulates muscle tightening (contraction) and helps nerves work properly.Normally, most of the bodys potassium is inside of cells, and only a very small amount is in the blood. Because the amount in the blood is so small, minor changes to potassium levels in the blood can be life-threatening. What are the causes? This condition may be caused by:  Antibiotic medicine.  Diarrhea or vomiting. Taking too much of a medicine that helps you have a bowel movement (laxative) can cause diarrhea and lead to hypokalemia.  Chronic kidney disease (CKD).  Medicines that help the body get rid of excess fluid (diuretics).  Eating disorders, such as bulimia.  Low magnesium levels in the body.  Sweating a lot. What are the signs or symptoms? Symptoms of this condition include:  Weakness.  Constipation.  Fatigue.  Muscle cramps.  Mental confusion.  Skipped heartbeats or irregular heartbeat (palpitations).  Tingling or numbness. How is this diagnosed? This condition is diagnosed with a blood test. How is this treated? Hypokalemia can be treated by taking potassium supplements by mouth or adjusting the medicines that you take. Treatment may also include eating more foods that contain a lot of potassium. If your potassium level is very low, you may need to get potassium through an IV tube in one of your veins and be monitored in the hospital. Follow these instructions at home:   Take over-the-counter and prescription medicines only as told by your health care provider. This includes vitamins and supplements.  Eat a healthy diet. A healthy diet includes fresh fruits and vegetables, whole grains, healthy fats, and lean proteins.  If instructed, eat more foods that contain a lot of potassium, such  as: ? Nuts, such as peanuts and pistachios. ? Seeds, such as sunflower seeds and pumpkin seeds. ? Peas, lentils, and lima beans. ? Whole grain and bran cereals and breads. ? Fresh fruits and vegetables, such as apricots, avocado, bananas, cantaloupe, kiwi, oranges, tomatoes, asparagus, and potatoes. ? Orange juice. ? Tomato juice. ? Red meats. ? Yogurt.  Keep all follow-up visits as told by your health care provider. This is important. Contact a health care provider if:  You have weakness that gets worse.  You feel your heart pounding or racing.  You vomit.  You have diarrhea.  You have diabetes (diabetes mellitus) and you have trouble keeping your blood sugar (glucose) in your target range. Get help right away if:  You have chest pain.  You have shortness of breath.  You have vomiting or diarrhea that lasts for more than 2 days.  You faint. This information is not intended to replace advice given to you by your health care provider. Make sure you discuss any questions you have with your health care provider. Document Released: 12/21/2004 Document Revised: 08/09/2015 Document Reviewed: 08/09/2015 Elsevier Interactive Patient Education  2019 [employer].

## 2018-01-31 NOTE — Progress Notes (Signed)
Inpatient Diabetes Program Recommendations  AACE/ADA: New Consensus Statement on Inpatient Glycemic Control (2015)  Target Ranges:  Prepandial:   less than 140 mg/dL      Peak postprandial:   less than 180 mg/dL (1-2 hours)      Critically ill patients:  140 - 180 mg/dL   Results for Sheila [name], Sheila [name] (MRN [id number]) as of 01/31/2018 07:38  Ref. Range 01/30/2018 06:02 01/30/2018 07:58 01/30/2018 11:18 01/30/2018 16:36 01/30/2018 21:32  Glucose-Capillary Latest Ref Range: 70 - 99 mg/dL 211 (H) 271 (H)  5 units NOVOLOG  200 (H)  2 units NOVOLOG  265 (H)  5 units NOVOLOG  264 (H)  3 units NOVOLOG     Admit with: Profound Hypokalemia, Hypoglycemia, Severe Lactic Acidosis, Leukocytosis  History: DM, ETOH Abuse (reports drinking every day from half a pint to 1 pint of liquor)  Home DM Meds: Metformin 500 mg BID  Current Orders: Novolog Sensitive Correction Scale/ SSI (0-9 units) TID AC + HS      Tradjenta 5 mg Daily       Received 125 mg Solumedrol 10pm on 01/26--CBGs >200 mg/dl yesterday (01/27) possibly from steroids still on board.  MD-- Metformin likely is Not the best choice for at home diabetes management given her issues with severe ETOH Abuse.    May need to stop the Metformin and start a different oral diabetes medication.  Spoke with Pharmacy yesterday about alternative oral DM meds for home and decision made to start Tradjenta 5 mg Daily today.  Tradjenta likely a safer med for home given it has less risk for Hypoglycemia.  Please check a current A1c to assess home glucose control.    --Will follow patient during hospitalization--  Wyn Quaker RN, MSN, CDE Diabetes Coordinator Inpatient Glycemic Control Team Team Pager: [phone] (8a-5p)

## 2018-01-31 NOTE — Progress Notes (Signed)
MD verbal order to continue with discharge with recheck of K-2.8 and phosphate <0.1.Patient discharge teaching given, including activity, diet, follow-up appoints, and medications. Patient verbalized understanding of all discharge instructions. IV access was d/c'd. Vitals are stable. Skin is intact except as charted in most recent assessments. Pt to be escorted out by volunteer, to be driven home by family.  Malan Werk [employer]

## 2018-01-31 NOTE — Progress Notes (Signed)
Notified dr. Marcille Blanco of pt critical value phosphorous of 1.0 and potassium of 2.4. acknowledged and stated he would put orders in

## 2018-01-31 NOTE — Progress Notes (Signed)
MD made aware of critical lab, phosphate level- <0.1. No new verbal orders and proceed with discharge. Pt made aware that MD may have another  prescription sent to her pharmacy to help her phosphate level and to follow the administrated instructions on the medication bottle. Pt verbalized understanding.  Quavion Boule [employer]

## 2018-02-03 NOTE — Discharge Summary (Signed)
[place] [facility] [place] NAME: Sheila [name]    MR#:  [id number]  DATE OF BIRTH:  18-Jul-1960  DATE OF ADMISSION:  01/29/2018   ADMITTING PHYSICIAN: Lance Coon, MD  DATE OF DISCHARGE: 01/31/2018  1:57 PM  PRIMARY CARE PHYSICIAN: Ellamae Sia, MD   ADMISSION DIAGNOSIS:  Hypokalemia [E87.6] Alcohol abuse [F10.10] Cholecystitis [K81.9] Hypoglycemia [E16.2] Sepsis with encephalopathy without septic shock, due to unspecified organism ([place]) [A41.9, R65.20, G93.40] DISCHARGE DIAGNOSIS:  Principal Problem:   SIRS (systemic inflammatory response syndrome) (HCC) Active Problems:   Diabetes (HCC)   HTN (hypertension)   HLD (hyperlipidemia)   GERD (gastroesophageal reflux disease)   Hypokalemia   Hypoglycemia   Lactic acidosis   Alcohol abuse   Abdominal pain, chronic, epigastric  SECONDARY DIAGNOSIS:   Past Medical History:  Diagnosis Date  . Anemia   . Arthritis   . Asthma   . COPD (chronic obstructive pulmonary disease) ([place])   . DDD (degenerative disc disease), lumbar   . Diabetes mellitus without complication ([place])    on metformin  . DJD (degenerative joint disease)   . Dysrhythmia   . Fatty liver   . Fibroids   . GERD (gastroesophageal reflux disease)   . Glaucoma   . Hyperlipidemia   . Hypertension   . IBS (irritable bowel syndrome)   . Lumbago   . Neuromuscular disorder ([place])   . 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[place]) 57/8921  . Renal disorder   . Ulcerative colitis ([place])    on mesalamine   HOSPITAL COURSE:  58 y f with abd pain  * SIRS (systemic inflammatory response syndrome) (HCC) -ruled out  * RUQ pain: neg HIDA scan. Could be from GERD  * Hypokalemia -replete and recheck  *Hypoglycemia -Resolved  *Lactic acidosis -resolved with hydration  *Alcohol abuse - treated with CIWA protocol while the Hospital  * Hypokalemia/Hypophostemia: repleted. I've called in additional dose of Kphos to her Pharmacy  at [place]:  stable CONSULTS OBTAINED:  Treatment Team:  Vickie Epley, MD Lucilla Lame, MD DRUG ALLERGIES:   Allergies  Allergen Reactions  . Ampicillin Anaphylaxis  . Erythromycin Anaphylaxis  . Omeprazole Anaphylaxis  . Penicillins Anaphylaxis and Other (See Comments)    Has patient had a PCN reaction causing immediate rash, facial/tongue/throat swelling, SOB or lightheadedness with hypotension: Yes Has patient had a PCN reaction causing severe rash involving mucus membranes or skin necrosis: No Has patient had a PCN reaction that required hospitalization: Unknown Has patient had a PCN reaction occurring within the last 10 years: Yes If all of the above answers are "NO", then may proceed with Cephalosporin use.   . Quinapril Cough  . Triamterene-Hctz Other (See Comments)   DISCHARGE MEDICATIONS:   Allergies as of 01/31/2018      Reactions   Ampicillin Anaphylaxis   Erythromycin Anaphylaxis   Omeprazole Anaphylaxis   Penicillins Anaphylaxis, Other (See Comments)   Has patient had a PCN reaction causing immediate rash, facial/tongue/throat swelling, SOB or lightheadedness with hypotension: Yes Has patient had a PCN reaction causing severe rash involving mucus membranes or skin necrosis: No Has patient had a PCN reaction that required hospitalization: Unknown Has patient had a PCN reaction occurring within the last 10 years: Yes If all of the above answers are "NO", then may proceed with Cephalosporin use.   Quinapril Cough   Triamterene-hctz Other (See Comments)      Medication List    STOP taking these  medications   metFORMIN 500 MG 24 hr tablet Commonly known as:  GLUCOPHAGE-XR     TAKE these medications   albuterol 108 (90 Base) MCG/ACT inhaler Commonly known as:  PROVENTIL HFA;VENTOLIN HFA Inhale 2 puffs into the lungs every 4 (four) hours as needed for wheezing or shortness of breath.   allopurinol 100 MG tablet Commonly known as:   ZYLOPRIM Take 100 mg by mouth daily.   bimatoprost 0.03 % ophthalmic solution Commonly known as:  LUMIGAN Place 1 drop into both eyes at bedtime.   chlorthalidone 25 MG tablet Commonly known as:  HYGROTON Take 25 mg by mouth daily.   colchicine 0.6 MG tablet Take 0.6 mg by mouth daily.   [employer] VITAMIN B12 1000 MCG tablet Generic drug:  cyanocobalamin Take 1,000 mcg by mouth daily.   dorzolamide 2 % ophthalmic solution Commonly known as:  TRUSOPT Place 1 drop into both eyes 2 (two) times daily.   gabapentin 300 MG capsule Commonly known as:  NEURONTIN Take 300 mg by mouth 3 (three) times daily.   KLOR-CON M20 20 MEQ tablet Generic drug:  potassium chloride SA Take 2 tablets (40 mEq total) by mouth daily.   latanoprost 0.005 % ophthalmic solution Commonly known as:  XALATAN Place 1 drop into both eyes at bedtime.   linagliptin 5 MG Tabs tablet Commonly known as:  TRADJENTA Take 1 tablet (5 mg total) by mouth daily.   losartan 25 MG tablet Commonly known as:  COZAAR Take 25 mg by mouth daily.   Magnesium Oxide 200 MG Tabs Commonly known as:  MAG-OXIDE Take 2 tablets (400 mg total) by mouth 2 (two) times daily.   mesalamine 0.375 g 24 hr capsule Commonly known as:  APRISO Take 1,500 mg by mouth daily.   metoprolol succinate 25 MG 24 hr tablet Commonly known as:  TOPROL-XL Take 1 tablet (25 mg total) by mouth daily.   metoprolol succinate 50 MG 24 hr tablet Commonly known as:  TOPROL-XL Take 50 mg by mouth at bedtime.   ondansetron 4 MG tablet Commonly known as:  ZOFRAN Take 1 tablet (4 mg total) by mouth every 8 (eight) hours as needed for nausea or vomiting.   ondansetron 4 MG tablet Commonly known as:  ZOFRAN Take 1 tablet (4 mg total) by mouth every 6 (six) hours as needed for nausea.   pantoprazole 40 MG tablet Commonly known as:  PROTONIX Take 40 mg by mouth every morning.   potassium phosphate (monobasic) 500 MG tablet Commonly known as:   K-PHOS Take 1 tablet (500 mg total) by mouth 4 (four) times daily -  with meals and at bedtime.   pravastatin 10 MG tablet Commonly known as:  PRAVACHOL Take 10 mg by mouth at bedtime.   sucralfate 1 g tablet Commonly known as:  CARAFATE Take 1 g by mouth 2 (two) times daily.        DISCHARGE INSTRUCTIONS:   DIET:  Regular diet DISCHARGE CONDITION:  Good ACTIVITY:  Activity as tolerated OXYGEN:  Home Oxygen: No.  Oxygen Delivery: room air DISCHARGE LOCATION:  home   If you experience worsening of your admission symptoms, develop shortness of breath, life threatening emergency, suicidal or homicidal thoughts you must seek medical attention immediately by calling 911 or calling your MD immediately  if symptoms less severe.  You Must read complete instructions/literature along with all the possible adverse reactions/side effects for all the Medicines you take and that have been prescribed to you. Take any  new Medicines after you have completely understood and accpet all the possible adverse reactions/side effects.   Please note  You were cared for by a hospitalist during your hospital stay. If you have any questions about your discharge medications or the care you received while you were in the hospital after you are discharged, you can call the unit and asked to speak with the hospitalist on call if the hospitalist that took care of you is not available. Once you are discharged, your primary care physician will handle any further medical issues. Please note that NO REFILLS for any discharge medications will be authorized once you are discharged, as it is imperative that you return to your primary care physician (or establish a relationship with a primary care physician if you do not have one) for your aftercare needs so that they can reassess your need for medications and monitor your lab values.    On the day of Discharge:  VITAL SIGNS:  Blood pressure 125/85, pulse 79,  temperature 97.8 F (36.6 C), temperature source Oral, resp. rate 20, height 5' (1.524 m), weight 75.8 kg, SpO2 96 %. PHYSICAL EXAMINATION:  GENERAL:  58 y.o.-year-old patient lying in the bed with no acute distress.  EYES: Pupils equal, round, reactive to light and accommodation. No scleral icterus. Extraocular muscles intact.  HEENT: Head atraumatic, normocephalic. Oropharynx and nasopharynx clear.  NECK:  Supple, no jugular venous distention. No thyroid enlargement, no tenderness.  LUNGS: Normal breath sounds bilaterally, no wheezing, rales,rhonchi or crepitation. No use of accessory muscles of respiration.  CARDIOVASCULAR: S1, S2 normal. No murmurs, rubs, or gallops.  ABDOMEN: Soft, non-tender, non-distended. Bowel sounds present. No organomegaly or mass.  EXTREMITIES: No pedal edema, cyanosis, or clubbing.  NEUROLOGIC: Cranial nerves II through XII are intact. Muscle strength 5/5 in all extremities. Sensation intact. Gait not checked.  PSYCHIATRIC: The patient is alert and oriented x 3.  SKIN: No obvious rash, lesion, or ulcer.  DATA REVIEW:   CBC Recent Labs  Lab 01/30/18 0150  WBC 18.0*  HGB 14.3  HCT 47.4*  PLT 260    Chemistries  Recent Labs  Lab 01/30/18 0150  01/31/18 0515 01/31/18 1110  NA 138   < > 138  --   K 2.8*   < > 2.4* 2.8*  CL 101   < > 93*  --   CO2 9*   < > 35*  --   GLUCOSE 228*   < > 282*  --   BUN 16   < > 14  --   CREATININE 0.75   < > 0.58  --   CALCIUM 7.8*   < > 7.9*  --   MG 1.8   < > 2.4  --   AST 63*  --   --   --   ALT 45*  --   --   --   ALKPHOS 73  --   --   --   BILITOT 1.4*  --   --   --    < > = values in this interval not displayed.    Follow-up Information    Ellamae Sia, MD. Go on 02/06/2018.   Specialty:  Internal Medicine Why:  @ 9am for hospital follow-up Contact information: [place] [address] [phone]        Lollie Sails, MD. Go on 02/14/2018.   Specialty:   Gastroenterology Why:  @ 10am for hospital follow-up Contact information: [place]  [facility] [place] [address] [phone]            Management plans discussed with the patient, family and they are in agreement.  CODE STATUS: Prior   TOTAL TIME TAKING CARE OF THIS PATIENT: 45 minutes.    Max Sane M.D on 02/03/2018 at 8:23 PM  Between 7am to 6pm - Pager - [phone]  After 6pm go to [email or web address] - Proofreader  [facility]  Office  [phone]  CC: Primary care physician; Ellamae Sia, MD   Note: This dictation was prepared with Dragon dictation along with smaller phrase technology. Any transcriptional errors that result from this process are unintentional.

## 2018-02-04 LAB — CULTURE, BLOOD (ROUTINE X 2)
Culture: NO GROWTH
Culture: NO GROWTH
Special Requests: ADEQUATE
Special Requests: ADEQUATE

## 2018-02-14 ENCOUNTER — Other Ambulatory Visit: Payer: Self-pay

## 2018-02-14 ENCOUNTER — Encounter: Payer: Self-pay | Admitting: Radiology

## 2018-02-14 ENCOUNTER — Observation Stay
Admission: EM | Admit: 2018-02-14 | Discharge: 2018-02-16 | Disposition: A | Discharge status: Home / Self Care | Payer: Medicaid Other | Attending: Internal Medicine | Admitting: Internal Medicine

## 2018-02-14 ENCOUNTER — Emergency Department: Payer: Medicaid Other

## 2018-02-14 ENCOUNTER — Emergency Department
Admission: EM | Admit: 2018-02-14 | Discharge: 2018-02-14 | Disposition: A | Discharge status: Home / Self Care | Payer: Medicaid Other | Source: Home / Self Care | Attending: Emergency Medicine | Admitting: Emergency Medicine

## 2018-02-14 ENCOUNTER — Encounter: Payer: Self-pay | Admitting: *Deleted

## 2018-02-14 DIAGNOSIS — R200 Anesthesia of skin: Secondary | ICD-10-CM | POA: Diagnosis not present

## 2018-02-14 DIAGNOSIS — Z87891 Personal history of nicotine dependence: Secondary | ICD-10-CM | POA: Insufficient documentation

## 2018-02-14 DIAGNOSIS — E119 Type 2 diabetes mellitus without complications: Secondary | ICD-10-CM | POA: Insufficient documentation

## 2018-02-14 DIAGNOSIS — F1010 Alcohol abuse, uncomplicated: Secondary | ICD-10-CM | POA: Insufficient documentation

## 2018-02-14 DIAGNOSIS — Z7984 Long term (current) use of oral hypoglycemic drugs: Secondary | ICD-10-CM | POA: Insufficient documentation

## 2018-02-14 DIAGNOSIS — E876 Hypokalemia: Secondary | ICD-10-CM | POA: Diagnosis present

## 2018-02-14 DIAGNOSIS — R197 Diarrhea, unspecified: Principal | ICD-10-CM

## 2018-02-14 DIAGNOSIS — J449 Chronic obstructive pulmonary disease, unspecified: Secondary | ICD-10-CM

## 2018-02-14 DIAGNOSIS — K7010 Alcoholic hepatitis without ascites: Secondary | ICD-10-CM | POA: Diagnosis not present

## 2018-02-14 DIAGNOSIS — Z8249 Family history of ischemic heart disease and other diseases of the circulatory system: Secondary | ICD-10-CM | POA: Insufficient documentation

## 2018-02-14 DIAGNOSIS — Z6832 Body mass index (BMI) 32.0-32.9, adult: Secondary | ICD-10-CM | POA: Diagnosis not present

## 2018-02-14 DIAGNOSIS — I10 Essential (primary) hypertension: Secondary | ICD-10-CM | POA: Diagnosis present

## 2018-02-14 DIAGNOSIS — Z881 Allergy status to other antibiotic agents status: Secondary | ICD-10-CM | POA: Insufficient documentation

## 2018-02-14 DIAGNOSIS — I6782 Cerebral ischemia: Secondary | ICD-10-CM | POA: Diagnosis not present

## 2018-02-14 DIAGNOSIS — R252 Cramp and spasm: Secondary | ICD-10-CM | POA: Diagnosis not present

## 2018-02-14 DIAGNOSIS — R17 Unspecified jaundice: Secondary | ICD-10-CM | POA: Diagnosis present

## 2018-02-14 DIAGNOSIS — K760 Fatty (change of) liver, not elsewhere classified: Secondary | ICD-10-CM | POA: Diagnosis not present

## 2018-02-14 DIAGNOSIS — K219 Gastro-esophageal reflux disease without esophagitis: Secondary | ICD-10-CM | POA: Diagnosis present

## 2018-02-14 DIAGNOSIS — E785 Hyperlipidemia, unspecified: Secondary | ICD-10-CM | POA: Diagnosis not present

## 2018-02-14 DIAGNOSIS — I959 Hypotension, unspecified: Secondary | ICD-10-CM | POA: Diagnosis not present

## 2018-02-14 DIAGNOSIS — K589 Irritable bowel syndrome without diarrhea: Secondary | ICD-10-CM | POA: Insufficient documentation

## 2018-02-14 DIAGNOSIS — M79669 Pain in unspecified lower leg: Secondary | ICD-10-CM

## 2018-02-14 DIAGNOSIS — Z833 Family history of diabetes mellitus: Secondary | ICD-10-CM | POA: Diagnosis not present

## 2018-02-14 DIAGNOSIS — Z888 Allergy status to other drugs, medicaments and biological substances status: Secondary | ICD-10-CM | POA: Insufficient documentation

## 2018-02-14 DIAGNOSIS — R9431 Abnormal electrocardiogram [ECG] [EKG]: Secondary | ICD-10-CM | POA: Insufficient documentation

## 2018-02-14 DIAGNOSIS — M79661 Pain in right lower leg: Secondary | ICD-10-CM | POA: Insufficient documentation

## 2018-02-14 DIAGNOSIS — Z79899 Other long term (current) drug therapy: Secondary | ICD-10-CM | POA: Insufficient documentation

## 2018-02-14 DIAGNOSIS — E669 Obesity, unspecified: Secondary | ICD-10-CM | POA: Diagnosis not present

## 2018-02-14 DIAGNOSIS — R112 Nausea with vomiting, unspecified: Secondary | ICD-10-CM

## 2018-02-14 DIAGNOSIS — R29898 Other symptoms and signs involving the musculoskeletal system: Secondary | ICD-10-CM | POA: Diagnosis present

## 2018-02-14 DIAGNOSIS — H42 Glaucoma in diseases classified elsewhere: Secondary | ICD-10-CM | POA: Diagnosis not present

## 2018-02-14 DIAGNOSIS — Z96653 Presence of artificial knee joint, bilateral: Secondary | ICD-10-CM | POA: Diagnosis not present

## 2018-02-14 DIAGNOSIS — Z880 Allergy status to penicillin: Secondary | ICD-10-CM | POA: Insufficient documentation

## 2018-02-14 DIAGNOSIS — E1139 Type 2 diabetes mellitus with other diabetic ophthalmic complication: Secondary | ICD-10-CM | POA: Insufficient documentation

## 2018-02-14 LAB — URINALYSIS, COMPLETE (UACMP) WITH MICROSCOPIC
Bilirubin Urine: NEGATIVE
Glucose, UA: NEGATIVE mg/dL
Hgb urine dipstick: NEGATIVE
Ketones, ur: NEGATIVE mg/dL
Leukocytes, UA: NEGATIVE
Nitrite: NEGATIVE
Protein, ur: NEGATIVE mg/dL
Specific Gravity, Urine: 1.02 (ref 1.005–1.030)
pH: 5 (ref 5.0–8.0)

## 2018-02-14 LAB — BASIC METABOLIC PANEL
Anion gap: 11 (ref 5–15)
BUN: 12 mg/dL (ref 6–20)
CALCIUM: 7.4 mg/dL — AB (ref 8.9–10.3)
CO2: 26 mmol/L (ref 22–32)
Chloride: 99 mmol/L (ref 98–111)
Creatinine, Ser: 0.63 mg/dL (ref 0.44–1.00)
GFR calc Af Amer: >60 mL/min (ref >60)
GFR calc non Af Amer: >60 mL/min (ref >60)
Glucose, Bld: 169 mg/dL — ABNORMAL HIGH (ref 70–99)
Potassium: 2.7 mmol/L — CL (ref 3.5–5.1)
SODIUM: 136 mmol/L (ref 135–145)

## 2018-02-14 LAB — CBC
HCT: 40.9 % (ref 36.0–46.0)
HEMATOCRIT: 43 % (ref 36.0–46.0)
Hemoglobin: 14.3 g/dL (ref 12.0–15.0)
Hemoglobin: 13.4 g/dL (ref 12.0–15.0)
MCH: 26 pg (ref 26.0–34.0)
MCH: 26.4 pg (ref 26.0–34.0)
MCHC: 33.3 g/dL (ref 30.0–36.0)
MCHC: 32.8 g/dL (ref 30.0–36.0)
MCV: 78.3 fL — ABNORMAL LOW (ref 80.0–100.0)
MCV: 80.7 fL (ref 80.0–100.0)
Platelets: 388 10*3/uL (ref 150–400)
Platelets: 353 10*3/uL (ref 150–400)
RBC: 5.49 MIL/uL — ABNORMAL HIGH (ref 3.87–5.11)
RBC: 5.07 MIL/uL (ref 3.87–5.11)
RDW: 15.7 % — ABNORMAL HIGH (ref 11.5–15.5)
RDW: 15.9 % — ABNORMAL HIGH (ref 11.5–15.5)
WBC: 10 10*3/uL (ref 4.0–10.5)
WBC: 11.1 10*3/uL — ABNORMAL HIGH (ref 4.0–10.5)
nRBC: 0 % (ref 0.0–0.2)
nRBC: 0 % (ref 0.0–0.2)

## 2018-02-14 LAB — COMPREHENSIVE METABOLIC PANEL
ALT: 262 U/L — AB (ref 0–44)
AST: 650 U/L — ABNORMAL HIGH (ref 15–41)
Albumin: 4 g/dL (ref 3.5–5.0)
Alkaline Phosphatase: 112 U/L (ref 38–126)
Anion gap: 15 (ref 5–15)
BUN: 18 mg/dL (ref 6–20)
CO2: 27 mmol/L (ref 22–32)
Calcium: 7.8 mg/dL — ABNORMAL LOW (ref 8.9–10.3)
Chloride: 95 mmol/L — ABNORMAL LOW (ref 98–111)
Creatinine, Ser: 0.53 mg/dL (ref 0.44–1.00)
GFR calc Af Amer: >60 mL/min (ref >60)
GFR calc non Af Amer: >60 mL/min (ref >60)
GLUCOSE: 186 mg/dL — AB (ref 70–99)
Potassium: 3.2 mmol/L — ABNORMAL LOW (ref 3.5–5.1)
Sodium: 137 mmol/L (ref 135–145)
Total Bilirubin: 4.9 mg/dL — ABNORMAL HIGH (ref 0.3–1.2)
Total Protein: 7.3 g/dL (ref 6.5–8.1)

## 2018-02-14 LAB — SEDIMENTATION RATE: SED RATE: 1 mm/h (ref 0–30)

## 2018-02-14 LAB — HEPATIC FUNCTION PANEL
ALT: 184 U/L — ABNORMAL HIGH (ref 0–44)
AST: 274 U/L — ABNORMAL HIGH (ref 15–41)
Albumin: 3.7 g/dL (ref 3.5–5.0)
Alkaline Phosphatase: 98 U/L (ref 38–126)
Bilirubin, Direct: 0.9 mg/dL — ABNORMAL HIGH (ref 0.0–0.2)
Indirect Bilirubin: 5 mg/dL — ABNORMAL HIGH (ref 0.3–0.9)
Total Bilirubin: 5.9 mg/dL — ABNORMAL HIGH (ref 0.3–1.2)
Total Protein: 7 g/dL (ref 6.5–8.1)

## 2018-02-14 LAB — TROPONIN I: Troponin I: <0.03 ng/mL (ref <0.03)

## 2018-02-14 LAB — LIPASE, BLOOD: LIPASE: 39 U/L (ref 11–51)

## 2018-02-14 LAB — GLUCOSE, CAPILLARY: GLUCOSE-CAPILLARY: 161 mg/dL — AB (ref 70–99)

## 2018-02-14 MED ORDER — IOHEXOL 300 MG/ML  SOLN
100.0000 mL | Freq: Once | INTRAMUSCULAR | Status: AC | PRN
  Administered 2018-02-14: 100 mL via INTRAVENOUS

## 2018-02-14 MED ORDER — ASPIRIN 81 MG PO CHEW
324.0000 mg | CHEWABLE_TABLET | Freq: Once | ORAL | Status: AC
  Administered 2018-02-14: 324 mg via ORAL
  Filled 2018-02-14: qty 4

## 2018-02-14 MED ORDER — FOLIC ACID 1 MG PO TABS
1.0000 mg | ORAL_TABLET | Freq: Once | ORAL | Status: AC
  Administered 2018-02-14: 1 mg via ORAL
  Filled 2018-02-14: qty 1

## 2018-02-14 MED ORDER — ONDANSETRON HCL 4 MG/2ML IJ SOLN
4.0000 mg | Freq: Once | INTRAMUSCULAR | Status: AC
  Administered 2018-02-14: 4 mg via INTRAVENOUS
  Filled 2018-02-14: qty 2

## 2018-02-14 MED ORDER — POTASSIUM CHLORIDE CRYS ER 20 MEQ PO TBCR
40.0000 meq | EXTENDED_RELEASE_TABLET | Freq: Once | ORAL | Status: AC
  Administered 2018-02-14: 40 meq via ORAL
  Filled 2018-02-14: qty 2

## 2018-02-14 MED ORDER — IOPAMIDOL (ISOVUE-300) INJECTION 61%
30.0000 mL | Freq: Once | INTRAVENOUS | Status: AC | PRN
  Administered 2018-02-14: 30 mL via INTRAVENOUS

## 2018-02-14 MED ORDER — VITAMIN B-1 100 MG PO TABS
100.0000 mg | ORAL_TABLET | Freq: Once | ORAL | Status: AC
  Administered 2018-02-14: 100 mg via ORAL
  Filled 2018-02-14: qty 1

## 2018-02-14 NOTE — ED Notes (Signed)
Pt is going to medical imaging.

## 2018-02-14 NOTE — Discharge Instructions (Addendum)
You have some inflammation in your liver.  I need you not to use any Tylenol and do not drink any alcohol.  Like you mentioned if you begin to get a little shaky return here immediately.  I want you to see your doctor with a GI doctor sometime in the next 3 days and repeat the blood work make sure you are getting better not worse.  Return here also if you begin vomiting again cannot keep anything down or if you get diarrhea comes back and you can stop that either.  Also return for fever or feeling worse.

## 2018-02-14 NOTE — ED Notes (Signed)
Pt is back from medical imaging. 

## 2018-02-14 NOTE — ED Triage Notes (Signed)
Pt in with co n.v.d since today along with some RLQ pain. Denies any dysuria, or fever.

## 2018-02-14 NOTE — H&P (Signed)
[facility] [place] NAME: Sheila [name]    MR#:  [id number]  DATE OF BIRTH:  January 18, 1960  DATE OF ADMISSION:  02/14/2018  PRIMARY CARE PHYSICIAN: Ellamae Sia, MD   REQUESTING/REFERRING PHYSICIAN: Clearnce Hasten, MD  CHIEF COMPLAINT:   Chief Complaint  Patient presents with  . Numbness    HISTORY OF PRESENT ILLNESS:  Sheila [name]  is a 58 y.o. female who presents with chief complaint as above.  Patient presents the ED tonight with a complaint of right lower extremity numbness and weakness, as well as bilateral hand numbness.  She was just in the ED last night complaining of diarrhea with nausea and vomiting.  She was found to have elevated transaminases at that time.  Work-up did not elucidate any direct clear cause.  She was sent home, returns today stating that her nausea and vomiting has resolved, but she developed this numbness earlier in the evening.  On lab evaluation her transaminases have improved, but her bilirubin has risen even higher.  Hospitalist were called for admission and further evaluation  PAST MEDICAL HISTORY:   Past Medical History:  Diagnosis Date  . Anemia   . Arthritis   . Asthma   . COPD (chronic obstructive pulmonary disease) ([place])   . DDD (degenerative disc disease), lumbar   . Diabetes mellitus without complication ([place])    on metformin  . DJD (degenerative joint disease)   . Dysrhythmia   . Fatty liver   . Fibroids   . GERD (gastroesophageal reflux disease)   . Glaucoma   . Hyperlipidemia   . Hypertension   . IBS (irritable bowel syndrome)   . Lumbago   . Neuromuscular disorder ([place])   . 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[place]) 18/5631  . Renal disorder   . Ulcerative colitis ([place])    on mesalamine     PAST SURGICAL HISTORY:   Past Surgical History:  Procedure Laterality Date  . COLONOSCOPY WITH PROPOFOL N/A 08/19/2015   Procedure: COLONOSCOPY WITH PROPOFOL;  Surgeon: Lollie Sails, MD;   Location: [facility] ENDOSCOPY;  Service: Endoscopy;  Laterality: N/A;  . COLONOSCOPY WITH PROPOFOL N/A 12/08/2015   Procedure: COLONOSCOPY WITH PROPOFOL;  Surgeon: Lollie Sails, MD;  Location: [facility] ENDOSCOPY;  Service: Endoscopy;  Laterality: N/A;  . COLONOSCOPY WITH PROPOFOL N/A 12/09/2015   Procedure: COLONOSCOPY WITH PROPOFOL;  Surgeon: Lollie Sails, MD;  Location: [facility] ENDOSCOPY;  Service: Endoscopy;  Laterality: N/A;  . ESOPHAGOGASTRODUODENOSCOPY (EGD) WITH PROPOFOL N/A 11/08/2017   Procedure: ESOPHAGOGASTRODUODENOSCOPY (EGD) WITH PROPOFOL;  Surgeon: Lollie Sails, MD;  Location: [facility] ENDOSCOPY;  Service: Endoscopy;  Laterality: N/A;  . JOINT REPLACEMENT Left 04/19/2006  . JOINT REPLACEMENT Right 01/14/2015  . TOTAL KNEE ARTHROPLASTY Left 2008  . TOTAL KNEE ARTHROPLASTY Right 01/14/2015   Procedure: TOTAL KNEE ARTHROPLASTY;  Surgeon: Hessie Knows, MD;  Location: [facility] ORS;  Service: Orthopedics;  Laterality: Right;     SOCIAL HISTORY:   Social History   Tobacco Use  . Smoking status: Former Smoker    Last attempt to quit: 01/04/1978    Years since quitting: 40.1  . Smokeless tobacco: Never Used  Substance Use Topics  . Alcohol use: Yes    Comment: Recovering alcoholic since 49/7026     FAMILY HISTORY:   Family History  Problem Relation Age of Onset  . Hypertension Mother   . Diabetes Father   . Hypertension Father   . Diabetes Brother   . Hypertension Brother   .  Breast cancer Neg Hx      DRUG ALLERGIES:   Allergies  Allergen Reactions  . Ampicillin Anaphylaxis  . Erythromycin Anaphylaxis  . Omeprazole Anaphylaxis  . Penicillins Anaphylaxis and Other (See Comments)    Has patient had a PCN reaction causing immediate rash, facial/tongue/throat swelling, SOB or lightheadedness with hypotension: Yes Has patient had a PCN reaction causing severe rash involving mucus membranes or skin necrosis: No Has patient had a PCN reaction that required hospitalization:  Unknown Has patient had a PCN reaction occurring within the last 10 years: Yes If all of the above answers are "NO", then may proceed with Cephalosporin use.   . Triamterene-Hctz Shortness Of Breath and Swelling  . Quinapril Cough    MEDICATIONS AT HOME:   Prior to Admission medications   Medication Sig Start Date End Date Taking? Authorizing Provider  acetaminophen (TYLENOL) 500 MG tablet Take 1,000 mg by mouth every 8 (eight) hours as needed for mild pain.    [provider]  albuterol (PROVENTIL HFA;VENTOLIN HFA) 108 (90 BASE) MCG/ACT inhaler Inhale 2 puffs into the lungs every 4 (four) hours as needed for wheezing or shortness of breath.    [provider]  allopurinol (ZYLOPRIM) 100 MG tablet Take 100 mg by mouth daily. 08/04/17   [provider]  bimatoprost (LUMIGAN) 0.03 % ophthalmic solution Place 1 drop into both eyes at bedtime.    [provider]  brinzolamide (AZOPT) 1 % ophthalmic suspension Place 1 drop into both eyes 2 (two) times daily.    [provider]  chlorthalidone (HYGROTON) 25 MG tablet Take 25 mg by mouth daily.  01/04/15   [provider]  [employer] VITAMIN B12 1000 MCG tablet Take 1,000 mcg by mouth daily. 07/20/17   [provider]  dorzolamide (TRUSOPT) 2 % ophthalmic solution Place 1 drop into both eyes 2 (two) times daily. 07/17/17   [provider]  gabapentin (NEURONTIN) 300 MG capsule Take 300 mg by mouth 3 (three) times daily.    [provider]  KLOR-CON M20 20 MEQ tablet Take 2 tablets (40 mEq total) by mouth daily. 01/31/18   Max Sane, MD  latanoprost (XALATAN) 0.005 % ophthalmic solution Place 1 drop into both eyes at bedtime. 01/01/18   [provider]  linagliptin (TRADJENTA) 5 MG TABS tablet Take 1 tablet (5 mg total) by mouth daily. 02/01/18   Max Sane, MD  losartan (COZAAR) 25 MG tablet Take 25 mg by mouth daily.     [provider]  Magnesium Oxide  (MAG-OXIDE) 200 MG TABS Take 2 tablets (400 mg total) by mouth 2 (two) times daily. 09/01/17   Dustin Flock, MD  mesalamine (LIALDA) 1.2 g EC tablet Take 2.4 g by mouth daily with breakfast.    [provider]  metFORMIN (GLUCOPHAGE-XR) 500 MG 24 hr tablet Take 500 mg by mouth 2 (two) times daily.    [provider]  metoprolol succinate (TOPROL-XL) 25 MG 24 hr tablet Take 1 tablet (25 mg total) by mouth daily. Patient not taking: Reported on 01/29/2018 01/17/15   Duanne Guess, PA-C  metoprolol succinate (TOPROL-XL) 50 MG 24 hr tablet Take 50 mg by mouth at bedtime. 01/17/18   [provider]  ondansetron (ZOFRAN) 4 MG tablet Take 1 tablet (4 mg total) by mouth every 8 (eight) hours as needed for nausea or vomiting. 08/28/17   Schuyler Amor, MD  ondansetron (ZOFRAN) 4 MG tablet Take 1 tablet (4 mg total)  by mouth every 6 (six) hours as needed for nausea. 09/01/17   Dustin Flock, MD  pantoprazole (PROTONIX) 40 MG tablet Take 40 mg by mouth every morning.    [provider]  potassium phosphate, monobasic, (K-PHOS) 500 MG tablet Take 1 tablet (500 mg total) by mouth 4 (four) times daily -  with meals and at bedtime. 01/31/18   Max Sane, MD  pravastatin (PRAVACHOL) 10 MG tablet Take 10 mg by mouth at bedtime.    [provider]  sucralfate (CARAFATE) 1 g tablet Take 1 g by mouth 2 (two) times daily.    [provider]  triamcinolone (KENALOG) 0.025 % cream Apply 1 application topically 2 (two) times daily.    [provider]    REVIEW OF SYSTEMS:  Review of Systems  Constitutional: Negative for chills, fever, malaise/fatigue and weight loss.  HENT: Negative for ear pain, hearing loss and tinnitus.   Eyes: Negative for blurred vision, double vision, pain and redness.  Respiratory: Negative for cough, hemoptysis and shortness of breath.   Cardiovascular: Negative for chest pain, palpitations, orthopnea and leg swelling.   Gastrointestinal: Positive for diarrhea. Negative for abdominal pain, constipation, nausea and vomiting.  Genitourinary: Negative for dysuria, frequency and hematuria.  Musculoskeletal: Negative for back pain, joint pain and neck pain.  Skin:       No acne, rash, or lesions  Neurological: Positive for sensory change and focal weakness. Negative for dizziness, tremors and weakness.  Endo/Heme/Allergies: Negative for polydipsia. Does not bruise/bleed easily.  Psychiatric/Behavioral: Negative for depression. The patient is not nervous/anxious and does not have insomnia.      VITAL SIGNS:   Vitals:   02/14/18 1927 02/14/18 2055  BP: 99/75 102/72  Pulse: 96 86  Resp: 20 16  Temp: 97.7 F (36.5 C)   TempSrc: Oral   SpO2: 97% 98%  Height: 5' (1.524 m)    Wt Readings from Last 3 Encounters:  02/14/18 75.8 kg  01/29/18 75.8 kg  11/08/17 65.8 kg    PHYSICAL EXAMINATION:  Physical Exam  Vitals reviewed. Constitutional: [name] oriented to person, place, and time. She appears well-developed and well-nourished. No distress.  HENT:  Head: Normocephalic and atraumatic.  Mouth/Throat: Oropharynx is clear and moist.  Eyes: Pupils are equal, round, and reactive to light. Conjunctivae and EOM are normal. No scleral icterus.  Neck: Normal range of motion. Neck supple. No JVD present. No thyromegaly present.  Cardiovascular: Normal rate, regular rhythm and intact distal pulses. Exam reveals no gallop and no friction rub.  No murmur heard. Respiratory: Effort normal and breath sounds normal. No respiratory distress. She has no wheezes. She has no rales.  GI: Soft. Bowel sounds are normal. She exhibits no distension. There is no abdominal tenderness.  Musculoskeletal: Normal range of motion.        General: No edema.     Comments: No arthritis, no gout  Lymphadenopathy:    She has no cervical adenopathy.  Neurological: [name] alert and oriented to person, place, and time. No cranial nerve  deficit.  Neurologic: Cranial nerves II-XII intact, Sensation intact to light touch/pinprick, 5/5 strength in right upper and lower extremities, 4/5 strength in left upper extremity, 5/5 strength and left lower extremity, no dysarthria, no aphasia, no dysphagia, memory intact, finger to nose testing showed no abnormality  Skin: Skin is warm and dry. No rash noted. No erythema.  Psychiatric: She has a normal mood and affect. Her behavior is normal. Judgment and  thought content normal.    LABORATORY PANEL:   CBC Recent Labs  Lab 02/14/18 1934  WBC 11.1*  HGB 13.4  HCT 40.9  PLT 353   ------------------------------------------------------------------------------------------------------------------  Chemistries  Recent Labs  Lab 02/14/18 1934  NA 136  K 2.7*  CL 99  CO2 26  GLUCOSE 169*  BUN 12  CREATININE 0.63  CALCIUM 7.4*  AST 274*  ALT 184*  ALKPHOS 98  BILITOT 5.9*   ------------------------------------------------------------------------------------------------------------------  Cardiac Enzymes Recent Labs  Lab 02/14/18 1934  TROPONINI <0.03   ------------------------------------------------------------------------------------------------------------------  RADIOLOGY:  Ct Head Wo Contrast  Result Date: 02/14/2018 CLINICAL DATA:  Lower extremity weakness, numbness. EXAM: CT HEAD WITHOUT CONTRAST TECHNIQUE: Contiguous axial images were obtained from the base of the skull through the vertex without intravenous contrast. COMPARISON:  07/03/2007 FINDINGS: Brain: No acute intracranial abnormality. Specifically, no hemorrhage, hydrocephalus, mass lesion, acute infarction, or significant intracranial injury. Vascular: No hyperdense vessel or unexpected calcification. Skull: No acute calvarial abnormality. Sinuses/Orbits: Visualized paranasal sinuses and mastoids clear. Orbital soft tissues unremarkable. Other: None IMPRESSION: No acute intracranial abnormality.  Electronically Signed   By: Rolm Baptise M.D.   On: 02/14/2018 21:48   Ct Abdomen Pelvis W Contrast  Result Date: 02/14/2018 CLINICAL DATA:  X 57 year old female with history of nausea, vomiting and diarrhea with right lower quadrant abdominal pain. EXAM: CT ABDOMEN AND PELVIS WITH CONTRAST TECHNIQUE: Multidetector CT imaging of the abdomen and pelvis was performed using the standard protocol following bolus administration of intravenous contrast. CONTRAST:  154m OMNIPAQUE IOHEXOL 300 MG/ML SOLN, 386mISOVUE-300 IOPAMIDOL (ISOVUE-300) INJECTION 61% COMPARISON:  CT the abdomen and pelvis 01/29/2018. FINDINGS: Lower chest: Mild scarring in the inferior segment of the lingula. Hepatobiliary: No suspicious cystic or solid hepatic lesions. No intra or extrahepatic biliary ductal dilatation. Gallbladder is normal in appearance. Pancreas: No pancreatic mass. No pancreatic ductal dilatation. No pancreatic or peripancreatic fluid or inflammatory changes. Spleen: Unremarkable. Adrenals/Urinary Tract: Bilateral kidneys and adrenal glands are normal in appearance. No hydroureteronephrosis. Urinary bladder is normal in appearance. Stomach/Bowel: Normal appearance of the stomach. No pathologic dilatation of small bowel or colon. A few scattered colonic diverticulae are noted, without surrounding inflammatory changes to suggest an acute diverticulitis at this time. Normal appendix. Vascular/Lymphatic: Aortic atherosclerosis, without evidence of aneurysm or dissection in the abdominal or pelvic vasculature. No lymphadenopathy noted in the abdomen or pelvis. Reproductive: Uterus is mildly enlarged and heterogeneous in appearance containing multiple internal lesions, several of which are partially calcified, presumably fibroid uterus. The largest uterine lesion is in the fundus measuring up to 4.6 cm in diameter. Ovaries are atrophic. Bilateral tubal ligation clips are incidentally noted. Other: No significant volume of ascites.   No pneumoperitoneum. Musculoskeletal: There are no aggressive appearing lytic or blastic lesions noted in the visualized portions of the skeleton. IMPRESSION: 1. No acute findings are noted in the abdomen or pelvis to account for the patient's symptoms. 2. Normal appendix. 3. Mild colonic diverticulosis without evidence of acute diverticulitis at this time. 4. Fibroid uterus. 5. Aortic atherosclerosis. Electronically Signed   By: DaVinnie Langton.D.   On: 02/14/2018 06:47    EKG:   Orders placed or performed during the hospital encounter of 02/14/18  . ED EKG  . ED EKG    IMPRESSION AND PLAN:  Principal Problem:   Right leg weakness -unclear etiology.  Perhaps due to low potassium or some other electrolyte abnormality, though we will admit per stroke admission order set to rule out  with MRI and neurology consult Active Problems:   Hypokalemia -replace potassium and monitor, check magnesium level and replete if low   Elevated bilirubin -unclear etiology, still has somewhat elevated transaminases though these are improved.  GI consult   Diabetes ([place]) -sliding scale insulin coverage   HTN (hypertension) -continue home meds   HLD (hyperlipidemia) -home dose antilipid   GERD (gastroesophageal reflux disease) -home dose PPI  Chart review performed and case discussed with ED provider. Labs, imaging and/or ECG reviewed by provider and discussed with patient/family. Management plans discussed with the patient and/or family.  DVT PROPHYLAXIS: SubQ lovenox   GI PROPHYLAXIS:  PPI   ADMISSION STATUS: Observation  CODE STATUS: Full Code Status History    Date Active Date Inactive Code Status Order ID Comments User Context   01/30/2018 0146 01/31/2018 1703 Full Code [id number]  Lance Coon, MD Inpatient   08/29/2017 2314 09/01/2017 1739 Full Code [id number]  Lance Coon, MD Inpatient   01/14/2015 1214 01/17/2015 1844 Full Code [id number]  Hessie Knows, MD Inpatient      TOTAL TIME TAKING  CARE OF THIS PATIENT: 40 minutes.   Ethlyn Daniels 02/14/2018, 10:58 PM  [facility]  Office  [phone]  CC: Primary care physician; Ellamae Sia, MD  Note:  This document was prepared using Dragon voice recognition software and may include unintentional dictation errors.

## 2018-02-14 NOTE — ED Provider Notes (Signed)
[facility] Emergency Department Provider Note  ____________________________________________   First MD Initiated Contact with Patient 02/14/18 2028     (approximate)  I have reviewed the triage vital signs and the nursing notes.   HISTORY  Chief Complaint Numbness   HPI Sheila [name] is a 58 y.o. female with a history of COPD, diabetes as well as fibroids who had her last drink several days ago who was presented to the emergency department today with numbness and weakness to the right lower extremity.  Says that she was here last night in the emergency department with abdominal pain and diarrhea.  Says that she was discharged this morning approximately 7 AM and has had 3-4 episodes of diarrhea which is been nonbloody since then.  However, also states that approximately 11 AM this morning that she began having weakness as well as numbness to the right medial leg.  Also with numbness to the bilateral upper extremities which is now resolved.  Does not report any facial droop or facial asymmetry.  Patient denies back pain.   Past Medical History:  Diagnosis Date  . Anemia   . Arthritis   . Asthma   . COPD (chronic obstructive pulmonary disease) ([place])   . DDD (degenerative disc disease), lumbar   . Diabetes mellitus without complication ([place])    on metformin  . DJD (degenerative joint disease)   . Dysrhythmia   . Fatty liver   . Fibroids   . GERD (gastroesophageal reflux disease)   . Glaucoma   . Hyperlipidemia   . Hypertension   . IBS (irritable bowel syndrome)   . Lumbago   . Neuromuscular disorder ([place])   . 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 Renal disorder   . Ulcerative colitis ([place])    on mesalamine    Patient Active Problem List   Diagnosis Date Noted  . Right leg weakness 02/14/2018  . Elevated bilirubin 02/14/2018  . SIRS (systemic inflammatory response syndrome) ([place]) 01/30/2018  . Hypoglycemia 01/30/2018  . Lactic acidosis  01/30/2018  . Alcohol abuse 01/30/2018  . Abdominal pain, chronic, epigastric   . Pancreatitis 08/29/2017  . Diabetes ([place]) 08/29/2017  . HTN (hypertension) 08/29/2017  . HLD (hyperlipidemia) 08/29/2017  . GERD (gastroesophageal reflux disease) 08/29/2017  . Hypokalemia 08/29/2017  . Hypocalcemia 08/29/2017  . Primary osteoarthritis of right knee 01/14/2015    Past Surgical History:  Procedure Laterality Date  . COLONOSCOPY WITH PROPOFOL N/A 08/19/2015   Procedure: COLONOSCOPY WITH PROPOFOL;  Surgeon: Lollie Sails, MD;  Location: [facility] ENDOSCOPY;  Service: Endoscopy;  Laterality: N/A;  . COLONOSCOPY WITH PROPOFOL N/A 12/08/2015   Procedure: COLONOSCOPY WITH PROPOFOL;  Surgeon: Lollie Sails, MD;  Location: [facility] ENDOSCOPY;  Service: Endoscopy;  Laterality: N/A;  . COLONOSCOPY WITH PROPOFOL N/A 12/09/2015   Procedure: COLONOSCOPY WITH PROPOFOL;  Surgeon: Lollie Sails, MD;  Location: [facility] ENDOSCOPY;  Service: Endoscopy;  Laterality: N/A;  . ESOPHAGOGASTRODUODENOSCOPY (EGD) WITH PROPOFOL N/A 11/08/2017   Procedure: ESOPHAGOGASTRODUODENOSCOPY (EGD) WITH PROPOFOL;  Surgeon: Lollie Sails, MD;  Location: [facility] ENDOSCOPY;  Service: Endoscopy;  Laterality: N/A;  . JOINT REPLACEMENT Left 04/19/2006  . JOINT REPLACEMENT Right 01/14/2015  . TOTAL KNEE ARTHROPLASTY Left 2008  . TOTAL KNEE ARTHROPLASTY Right 01/14/2015   Procedure: TOTAL KNEE ARTHROPLASTY;  Surgeon: Hessie Knows, MD;  Location: [facility] ORS;  Service: Orthopedics;  Laterality: Right;    Prior to Admission medications   Medication Sig Start Date End Date Taking? Authorizing Provider  acetaminophen (TYLENOL) 500 MG tablet Take 1,000 mg by mouth every 8 (eight) hours as needed for mild pain.    [provider]  albuterol (PROVENTIL HFA;VENTOLIN HFA) 108 (90 BASE) MCG/ACT inhaler Inhale 2 puffs into the lungs every 4 (four) hours as needed for wheezing or shortness of breath.    [provider]  allopurinol  (ZYLOPRIM) 100 MG tablet Take 100 mg by mouth daily. 08/04/17   [provider]  bimatoprost (LUMIGAN) 0.03 % ophthalmic solution Place 1 drop into both eyes at bedtime.    [provider]  brinzolamide (AZOPT) 1 % ophthalmic suspension Place 1 drop into both eyes 2 (two) times daily.    [provider]  chlorthalidone (HYGROTON) 25 MG tablet Take 25 mg by mouth daily.  01/04/15   [provider]  [employer] VITAMIN B12 1000 MCG tablet Take 1,000 mcg by mouth daily. 07/20/17   [provider]  dorzolamide (TRUSOPT) 2 % ophthalmic solution Place 1 drop into both eyes 2 (two) times daily. 07/17/17   [provider]  gabapentin (NEURONTIN) 300 MG capsule Take 300 mg by mouth 3 (three) times daily.    [provider]  KLOR-CON M20 20 MEQ tablet Take 2 tablets (40 mEq total) by mouth daily. 01/31/18   Max Sane, MD  latanoprost (XALATAN) 0.005 % ophthalmic solution Place 1 drop into both eyes at bedtime. 01/01/18   [provider]  linagliptin (TRADJENTA) 5 MG TABS tablet Take 1 tablet (5 mg total) by mouth daily. 02/01/18   Max Sane, MD  losartan (COZAAR) 25 MG tablet Take 25 mg by mouth daily.     [provider]  Magnesium Oxide (MAG-OXIDE) 200 MG TABS Take 2 tablets (400 mg total) by mouth 2 (two) times daily. 09/01/17   Dustin Flock, MD  mesalamine (LIALDA) 1.2 g EC tablet Take 2.4 g by mouth daily with breakfast.    [provider]  metFORMIN (GLUCOPHAGE-XR) 500 MG 24 hr tablet Take 500 mg by mouth 2 (two) times daily.    [provider]  metoprolol succinate (TOPROL-XL) 25 MG 24 hr tablet Take 1 tablet (25 mg total) by mouth daily. Patient not taking: Reported on 01/29/2018 01/17/15   Duanne Guess, PA-C  metoprolol succinate (TOPROL-XL) 50 MG 24 hr tablet Take 50 mg by mouth at bedtime. 01/17/18   [provider]  ondansetron (ZOFRAN) 4 MG tablet Take 1 tablet (4 mg total) by mouth every 8  (eight) hours as needed for nausea or vomiting. 08/28/17   Schuyler Amor, MD  ondansetron (ZOFRAN) 4 MG tablet Take 1 tablet (4 mg total) by mouth every 6 (six) hours as needed for nausea. 09/01/17   Dustin Flock, MD  pantoprazole (PROTONIX) 40 MG tablet Take 40 mg by mouth every morning.    [provider]  potassium phosphate, monobasic, (K-PHOS) 500 MG tablet Take 1 tablet (500 mg total) by mouth 4 (four) times daily -  with meals and at bedtime. 01/31/18   Max Sane, MD  pravastatin (PRAVACHOL) 10 MG tablet Take 10 mg by mouth at bedtime.    [provider]  sucralfate (CARAFATE) 1 g tablet Take 1 g by mouth 2 (two) times daily.    [provider]  triamcinolone (KENALOG) 0.025 % cream Apply 1 application topically 2 (two) times daily.    [provider]    Allergies Ampicillin; Erythromycin; Omeprazole; Penicillins; Triamterene-hctz; and Quinapril  Family History  Problem Relation Age of  Onset  . Hypertension Mother   . Diabetes Father   . Hypertension Father   . Diabetes Brother   . Hypertension Brother   . Breast cancer Neg Hx     Social History Social History   Tobacco Use  . Smoking status: Former Smoker    Last attempt to quit: 01/04/1978    Years since quitting: 40.1  . Smokeless tobacco: Never Used  Substance Use Topics  . Alcohol use: Yes    Comment: Recovering alcoholic since 42/3536  . Drug use: No    Comment: used cocaine, quit in 2003    Review of Systems  Constitutional: No fever/chills Eyes: No visual changes. ENT: No sore throat. Cardiovascular: Denies chest pain. Respiratory: Denies shortness of breath. Gastrointestinal: No abdominal pain.  No nausea, no vomiting.    No constipation. Genitourinary: Negative for dysuria. Musculoskeletal: Negative for back pain. Skin: Negative for rash. Neurological: As above   ____________________________________________   PHYSICAL EXAM:  VITAL SIGNS: ED Triage Vitals    Enc Vitals Group     BP 02/14/18 1927 99/75     Pulse Rate 02/14/18 1927 96     Resp 02/14/18 1927 20     Temp 02/14/18 1927 97.7 F (36.5 C)     Temp Source 02/14/18 1927 Oral     SpO2 02/14/18 1927 97 %     Weight --      Height 02/14/18 1927 5' (1.524 m)     Head Circumference --      Peak Flow --      Pain Score 02/14/18 1931 0     Pain Loc --      Pain Edu? --      Excl. in [place]? --     Constitutional: Alert and oriented. Well appearing and in no acute distress. Eyes: Conjunctivae are normal.  Head: Atraumatic. Nose: No congestion/rhinnorhea. Mouth/Throat: Mucous membranes are moist.  Neck: No stridor.   Cardiovascular: Normal rate, regular rhythm. Grossly normal heart sounds.  Good peripheral circulation with equal and bilateral posterior tibial pulses. Respiratory: Normal respiratory effort.  No retractions. Lungs CTAB. Gastrointestinal: Soft and nontender. No distention. No CVA tenderness. Musculoskeletal: No lower extremity tenderness nor edema.  No joint effusions.  No tenderness palpation of the lumbar region.  No deformity or step-off. Neurologic:  Normal speech and language.  Week 4-5 strength to the right lower extremity with hip flexion.  Decreased sensation to light touch but still sensate to the right medial lower extremity.  Otherwise, neurovascularly intact. Skin:  Skin is warm, dry and intact. No rash noted. Psychiatric: Mood and affect are normal. Speech and behavior are normal.  ____________________________________________   LABS (all labs ordered are listed, but only abnormal results are displayed)  Labs Reviewed  BASIC METABOLIC PANEL - Abnormal; Notable for the following components:      Result Value   Potassium 2.7 (*)    Glucose, Bld 169 (*)    Calcium 7.4 (*)    All other components within normal limits  CBC - Abnormal; Notable for the following components:   WBC 11.1 (*)    RDW 15.9 (*)    All other components within normal limits  GLUCOSE,  CAPILLARY - Abnormal; Notable for the following components:   Glucose-Capillary 161 (*)    All other components within normal limits  HEPATIC FUNCTION PANEL - Abnormal; Notable for the following components:   AST 274 (*)    ALT 184 (*)    Total Bilirubin  5.9 (*)    Bilirubin, Direct 0.9 (*)    Indirect Bilirubin 5.0 (*)    All other components within normal limits  TROPONIN I  CBG MONITORING, ED   ____________________________________________  EKG  ED ECG REPORT I, Doran Stabler, the attending physician, personally viewed and interpreted this ECG.   Date: 02/14/2018  EKG Time: 1937  Rate: 91  Rhythm: normal sinus rhythm  Axis: Normal  Intervals:none  ST&T Change: No ST segment elevation or depression.  T wave inversions in V2 through V6 as well as 2, 3 and aVF. No significant change from previous. ____________________________________________  RADIOLOGY  CT head without acute pathology.  ____________________________________________   PROCEDURES  Procedure(s) performed:   Procedures  Critical Care performed:   ____________________________________________   INITIAL IMPRESSION / ASSESSMENT AND PLAN / ED COURSE  Pertinent labs & imaging results that were available during my care of the patient were reviewed by me and considered in my medical decision making (see chart for details).  Differential diagnosis includes, but is not limited to, alcohol, illicit or prescription medications, or other toxic ingestion; intracranial pathology such as stroke or intracerebral hemorrhage; fever or infectious causes including sepsis; hypoxemia and/or hypercarbia; uremia; trauma; endocrine related disorders such as diabetes, hypoglycemia, and thyroid-related diseases; hypertensive encephalopathy; etc. As part of my medical decision making, I reviewed the following data within the electronic MEDICAL RECORD NUMBER Notes from prior ED  visits  ----------------------------------------- 11:11 PM on 02/14/2018 -----------------------------------------  Patient states that she is feeling improved but still with weakness and numbness to the right lower extremity.  Increasing bilirubin as well.  Discussed case Dr. Bonna Gains who says that she will follow the patient as an inpatient.  Patient be admitted to the hospital.  Signed out to Dr. Jannifer Franklin. ____________________________________________   FINAL CLINICAL IMPRESSION(S) / ED DIAGNOSES  Right lower extremity weakness.  Hypokalemia.  Hyperbilirubinemia.  NEW MEDICATIONS STARTED DURING THIS VISIT:  New Prescriptions   No medications on file     Note:  This document was prepared using Dragon voice recognition software and may include unintentional dictation errors.     Orbie Pyo, MD 02/14/18 2312

## 2018-02-14 NOTE — ED Provider Notes (Signed)
[facility] Emergency Department Provider Note   ____________________________________________   First MD Initiated Contact with Patient 02/14/18 [phone]     (approximate)  I have reviewed the triage vital signs and the nursing notes.   HISTORY  Chief Complaint Emesis and Diarrhea   HPI Sheila [name] is a 58 y.o. female patient complains of diarrhea all day yesterday and this morning.  She started having some nausea and vomiting at night.  This is better now though.  She also complains of some right lower quadrant pain.  She thinks this may be due to her vomiting.  Review of her records show that she had some elevation in her LFTs before but now it is much worse.  Her pain is worse in the right lower quadrant.   Past Medical History:  Diagnosis Date  . Anemia   . Arthritis   . Asthma   . COPD (chronic obstructive pulmonary disease) ([place])   . DDD (degenerative disc disease), lumbar   . Diabetes mellitus without complication ([place])    on metformin  . DJD (degenerative joint disease)   . Dysrhythmia   . Fatty liver   . Fibroids   . GERD (gastroesophageal reflux disease)   . Glaucoma   . Hyperlipidemia   . Hypertension   . IBS (irritable bowel syndrome)   . Lumbago   . Neuromuscular disorder ([place])   . 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[place]) [id number]/5701  . Renal disorder   . Ulcerative colitis ([place])    on mesalamine    Patient Active Problem List   Diagnosis Date Noted  . SIRS (systemic inflammatory response syndrome) ([place]) 01/30/2018  . Hypoglycemia 01/30/2018  . Lactic acidosis 01/30/2018  . Alcohol abuse 01/30/2018  . Abdominal pain, chronic, epigastric   . Pancreatitis 08/29/2017  . Diabetes ([place]) 08/29/2017  . HTN (hypertension) 08/29/2017  . HLD (hyperlipidemia) 08/29/2017  . GERD (gastroesophageal reflux disease) 08/29/2017  . Hypokalemia 08/29/2017  . Hypocalcemia 08/29/2017  . Primary osteoarthritis of right knee 01/14/2015    Past Surgical  History:  Procedure Laterality Date  . COLONOSCOPY WITH PROPOFOL N/A 08/19/2015   Procedure: COLONOSCOPY WITH PROPOFOL;  Surgeon: Lollie Sails, MD;  Location: [facility] ENDOSCOPY;  Service: Endoscopy;  Laterality: N/A;  . COLONOSCOPY WITH PROPOFOL N/A 12/08/2015   Procedure: COLONOSCOPY WITH PROPOFOL;  Surgeon: Lollie Sails, MD;  Location: [facility] ENDOSCOPY;  Service: Endoscopy;  Laterality: N/A;  . COLONOSCOPY WITH PROPOFOL N/A 12/09/2015   Procedure: COLONOSCOPY WITH PROPOFOL;  Surgeon: Lollie Sails, MD;  Location: [facility] ENDOSCOPY;  Service: Endoscopy;  Laterality: N/A;  . ESOPHAGOGASTRODUODENOSCOPY (EGD) WITH PROPOFOL N/A 11/08/2017   Procedure: ESOPHAGOGASTRODUODENOSCOPY (EGD) WITH PROPOFOL;  Surgeon: Lollie Sails, MD;  Location: [facility] ENDOSCOPY;  Service: Endoscopy;  Laterality: N/A;  . JOINT REPLACEMENT Left 04/19/2006  . JOINT REPLACEMENT Right 01/14/2015  . TOTAL KNEE ARTHROPLASTY Left 2008  . TOTAL KNEE ARTHROPLASTY Right 01/14/2015   Procedure: TOTAL KNEE ARTHROPLASTY;  Surgeon: Hessie Knows, MD;  Location: [facility] ORS;  Service: Orthopedics;  Laterality: Right;    Prior to Admission medications   Medication Sig Start Date End Date Taking? Authorizing Provider  acetaminophen (TYLENOL) 500 MG tablet Take 1,000 mg by mouth every 8 (eight) hours as needed for mild pain.   Yes [provider]  albuterol (PROVENTIL HFA;VENTOLIN HFA) 108 (90 BASE) MCG/ACT inhaler Inhale 2 puffs into the lungs every 4 (four) hours as needed for wheezing or shortness of breath.   Yes [provider]  allopurinol (ZYLOPRIM) 100 MG tablet Take 100 mg by mouth daily. 08/04/17  Yes [provider]  brinzolamide (AZOPT) 1 % ophthalmic suspension Place 1 drop into both eyes 2 (two) times daily.   Yes [provider]  chlorthalidone (HYGROTON) 25 MG tablet Take 25 mg by mouth daily.  01/04/15  Yes [provider]  [employer] VITAMIN B12 1000 MCG tablet Take 1,000 mcg by mouth  daily. 07/20/17  Yes [provider]  KLOR-CON M20 20 MEQ tablet Take 2 tablets (40 mEq total) by mouth daily. 01/31/18  Yes Max Sane, MD  latanoprost (XALATAN) 0.005 % ophthalmic solution Place 1 drop into both eyes at bedtime. 01/01/18  Yes [provider]  losartan (COZAAR) 25 MG tablet Take 25 mg by mouth daily.    Yes [provider]  mesalamine (LIALDA) 1.2 g EC tablet Take 2.4 g by mouth daily with breakfast.   Yes [provider]  metFORMIN (GLUCOPHAGE-XR) 500 MG 24 hr tablet Take 500 mg by mouth 2 (two) times daily.   Yes [provider]  pantoprazole (PROTONIX) 40 MG tablet Take 40 mg by mouth every morning.   Yes [provider]  pravastatin (PRAVACHOL) 10 MG tablet Take 10 mg by mouth at bedtime.   Yes [provider]  triamcinolone (KENALOG) 0.025 % cream Apply 1 application topically 2 (two) times daily.   Yes [provider]  bimatoprost (LUMIGAN) 0.03 % ophthalmic solution Place 1 drop into both eyes at bedtime.    [provider]  dorzolamide (TRUSOPT) 2 % ophthalmic solution Place 1 drop into both eyes 2 (two) times daily. 07/17/17   [provider]  gabapentin (NEURONTIN) 300 MG capsule Take 300 mg by mouth 3 (three) times daily.    [provider]  linagliptin (TRADJENTA) 5 MG TABS tablet Take 1 tablet (5 mg total) by mouth daily. 02/01/18   Max Sane, MD  Magnesium Oxide (MAG-OXIDE) 200 MG TABS Take 2 tablets (400 mg total) by mouth 2 (two) times daily. 09/01/17   Dustin Flock, MD  metoprolol succinate (TOPROL-XL) 25 MG 24 hr tablet Take 1 tablet (25 mg total) by mouth daily. Patient not taking: Reported on 01/29/2018 01/17/15   Duanne Guess, PA-C  metoprolol succinate (TOPROL-XL) 50 MG 24 hr tablet Take 50 mg by mouth at bedtime. 01/17/18   [provider]  ondansetron (ZOFRAN) 4 MG tablet Take 1 tablet (4 mg total) by mouth every 8 (eight) hours as needed for nausea  or vomiting. 08/28/17   Schuyler Amor, MD  ondansetron (ZOFRAN) 4 MG tablet Take 1 tablet (4 mg total) by mouth every 6 (six) hours as needed for nausea. 09/01/17   Dustin Flock, MD  potassium phosphate, monobasic, (K-PHOS) 500 MG tablet Take 1 tablet (500 mg total) by mouth 4 (four) times daily -  with meals and at bedtime. 01/31/18   Max Sane, MD  sucralfate (CARAFATE) 1 g tablet Take 1 g by mouth 2 (two) times daily.    [provider]    Allergies Ampicillin; Erythromycin; Omeprazole; Penicillins; Triamterene-hctz; and Quinapril  Family History  Problem Relation Age of Onset  . Hypertension Mother   . Diabetes Father   . Hypertension Father   . Diabetes Brother   . Hypertension Brother   . Breast cancer Neg Hx     Social History Social History   Tobacco Use  . Smoking status: Former Smoker    Last attempt to quit: 01/04/1978  Years since quitting: 40.1  . Smokeless tobacco: Never Used  Substance Use Topics  . Alcohol use: Yes    Comment: Recovering alcoholic since 56/8127  . Drug use: No    Comment: used cocaine, quit in 2003    Review of Systems  Constitutional: No fever/chills Eyes: No visual changes. ENT: No sore throat. Cardiovascular: Denies chest pain. Respiratory: Denies shortness of breath. Gastrointestinal: See HPI. Genitourinary: Negative for dysuria. Musculoskeletal: Negative for back pain. Skin: Negative for rash. Neurological: Negative for headaches, focal weakness   ____________________________________________   PHYSICAL EXAM:  VITAL SIGNS: ED Triage Vitals  Enc Vitals Group     BP --      Pulse --      Resp --      Temp --      Temp src --      SpO2 --      Weight 02/14/18 0150 167 lb (75.8 kg)     Height 02/14/18 0150 5' (1.524 m)     Head Circumference --      Peak Flow --      Pain Score 02/14/18 0149 7     Pain Loc --      Pain Edu? --      Excl. in [place]? --     Constitutional: Alert and oriented. Well  appearing and in no acute distress. Eyes: Conjunctivae are normal.  Head: Atraumatic. Nose: No congestion/rhinnorhea. Mouth/Throat: Mucous membranes are moist.  Oropharynx non-erythematous. Neck: No stridor. Cardiovascular: Normal rate, regular rhythm. Grossly normal heart sounds.  Good peripheral circulation. Respiratory: Normal respiratory effort.  No retractions. Lungs CTAB. Gastrointestinal: Soft there is some right-sided tenderness worse in the right lower quadrant.  Patient says when I percuss her that she "feels it". No distention. No abdominal bruits. No CVA tenderness. Musculoskeletal: No lower extremity tenderness nor edema.   Neurologic:  Normal speech and language. No gross focal neurologic deficits are appreciated. No gait instability. Skin:  Skin is warm, dry and intact. No rash noted.   ____________________________________________   LABS (all labs ordered are listed, but only abnormal results are displayed)  Labs Reviewed  CBC - Abnormal; Notable for the following components:      Result Value   RBC 5.49 (*)    MCV 78.3 (*)    RDW 15.7 (*)    All other components within normal limits  COMPREHENSIVE METABOLIC PANEL - Abnormal; Notable for the following components:   Potassium 3.2 (*)    Chloride 95 (*)    Glucose, Bld 186 (*)    Calcium 7.8 (*)    AST 650 (*)    ALT 262 (*)    Total Bilirubin 4.9 (*)    All other components within normal limits  URINALYSIS, COMPLETE (UACMP) WITH MICROSCOPIC - Abnormal; Notable for the following components:   Color, Urine YELLOW (*)    APPearance CLEAR (*)    Bacteria, UA RARE (*)    All other components within normal limits  LIPASE, BLOOD  SEDIMENTATION RATE  HEPATITIS PANEL, ACUTE   ____________________________________________  EKG  ____________________________________________  RADIOLOGY  ED MD interpretation: Patient CT scan read by radiology shows no acute disease  Official radiology report(s): Ct Abdomen Pelvis  W Contrast  Result Date: 02/14/2018 CLINICAL DATA:  X 58 year old female with history of nausea, vomiting and diarrhea with right lower quadrant abdominal pain. EXAM: CT ABDOMEN AND PELVIS WITH CONTRAST TECHNIQUE: Multidetector CT imaging of the abdomen and pelvis was performed using the standard  protocol following bolus administration of intravenous contrast. CONTRAST:  142m OMNIPAQUE IOHEXOL 300 MG/ML SOLN, 375mISOVUE-300 IOPAMIDOL (ISOVUE-300) INJECTION 61% COMPARISON:  CT the abdomen and pelvis 01/29/2018. FINDINGS: Lower chest: Mild scarring in the inferior segment of the lingula. Hepatobiliary: No suspicious cystic or solid hepatic lesions. No intra or extrahepatic biliary ductal dilatation. Gallbladder is normal in appearance. Pancreas: No pancreatic mass. No pancreatic ductal dilatation. No pancreatic or peripancreatic fluid or inflammatory changes. Spleen: Unremarkable. Adrenals/Urinary Tract: Bilateral kidneys and adrenal glands are normal in appearance. No hydroureteronephrosis. Urinary bladder is normal in appearance. Stomach/Bowel: Normal appearance of the stomach. No pathologic dilatation of small bowel or colon. A few scattered colonic diverticulae are noted, without surrounding inflammatory changes to suggest an acute diverticulitis at this time. Normal appendix. Vascular/Lymphatic: Aortic atherosclerosis, without evidence of aneurysm or dissection in the abdominal or pelvic vasculature. No lymphadenopathy noted in the abdomen or pelvis. Reproductive: Uterus is mildly enlarged and heterogeneous in appearance containing multiple internal lesions, several of which are partially calcified, presumably fibroid uterus. The largest uterine lesion is in the fundus measuring up to 4.6 cm in diameter. Ovaries are atrophic. Bilateral tubal ligation clips are incidentally noted. Other: No significant volume of ascites.  No pneumoperitoneum. Musculoskeletal: There are no aggressive appearing lytic or blastic  lesions noted in the visualized portions of the skeleton. IMPRESSION: 1. No acute findings are noted in the abdomen or pelvis to account for the patient's symptoms. 2. Normal appendix. 3. Mild colonic diverticulosis without evidence of acute diverticulitis at this time. 4. Fibroid uterus. 5. Aortic atherosclerosis. Electronically Signed   By: DaVinnie Langton.D.   On: 02/14/2018 06:47    ____________________________________________   PROCEDURES  Procedure(s) performed:   Procedures  Critical Care performed:   ____________________________________________   INITIAL IMPRESSION / ASSESSMENT AND PLAN / ED COURSE  Patient is not having any further nausea vomiting or diarrhea.  She does have marked increase in her liver functions.  [name] still drinking.  I have told her not to drink at all.  She says she does not have withdrawal symptoms brought.  I also told her not to take any Tylenol.  I will send an acute hepatitis panel.  I want her to follow-up with her regular doctor or GI in the next 2 to 3 days.  She will return here if she is worse.        ____________________________________________   FINAL CLINICAL IMPRESSION(S) / ED DIAGNOSES  Final diagnoses:  Nausea vomiting and diarrhea     ED Discharge Orders    None       Note:  This document was prepared using Dragon voice recognition software and may include unintentional dictation errors.    MaNena PolioMD 02/14/18 [id number]

## 2018-02-14 NOTE — ED Triage Notes (Addendum)
Pt to ED reporting numbness in both arms and left leg. Pt was seen in ED last night for hypokalemia and enlarged liver. Since discharge the numbness has started and worsened. Pt also reporting her CBG was 147 at home and has been fluctuating recently.   No neuro deficits at this time.

## 2018-02-15 ENCOUNTER — Observation Stay: Payer: Medicaid Other

## 2018-02-15 ENCOUNTER — Observation Stay (HOSPITAL_BASED_OUTPATIENT_CLINIC_OR_DEPARTMENT_OTHER)
Admit: 2018-02-15 | Discharge: 2018-02-15 | Disposition: A | Discharge status: Home / Self Care | Payer: Medicaid Other | Attending: Internal Medicine | Admitting: Internal Medicine

## 2018-02-15 DIAGNOSIS — R29898 Other symptoms and signs involving the musculoskeletal system: Secondary | ICD-10-CM

## 2018-02-15 DIAGNOSIS — G459 Transient cerebral ischemic attack, unspecified: Secondary | ICD-10-CM | POA: Diagnosis not present

## 2018-02-15 LAB — ECHOCARDIOGRAM COMPLETE
Height: 60 in
Weight: 2644.8 oz

## 2018-02-15 LAB — BASIC METABOLIC PANEL
Anion gap: 10 (ref 5–15)
Anion gap: 7 (ref 5–15)
BUN: 12 mg/dL (ref 6–20)
BUN: 9 mg/dL (ref 6–20)
CO2: 25 mmol/L (ref 22–32)
CO2: 29 mmol/L (ref 22–32)
Calcium: 7.1 mg/dL — ABNORMAL LOW (ref 8.9–10.3)
Calcium: 7.6 mg/dL — ABNORMAL LOW (ref 8.9–10.3)
Chloride: 102 mmol/L (ref 98–111)
Chloride: 100 mmol/L (ref 98–111)
Creatinine, Ser: 0.62 mg/dL (ref 0.44–1.00)
Creatinine, Ser: 0.59 mg/dL (ref 0.44–1.00)
GFR calc Af Amer: >60 mL/min (ref >60)
GFR calc non Af Amer: >60 mL/min (ref >60)
GFR calc non Af Amer: >60 mL/min (ref >60)
Glucose, Bld: 145 mg/dL — ABNORMAL HIGH (ref 70–99)
Glucose, Bld: 151 mg/dL — ABNORMAL HIGH (ref 70–99)
Potassium: 3.3 mmol/L — ABNORMAL LOW (ref 3.5–5.1)
Potassium: 3.4 mmol/L — ABNORMAL LOW (ref 3.5–5.1)
Sodium: 137 mmol/L (ref 135–145)
Sodium: 136 mmol/L (ref 135–145)

## 2018-02-15 LAB — LIPID PANEL
Cholesterol: 94 mg/dL (ref 0–200)
HDL: 41 mg/dL (ref >40)
LDL CALC: 16 mg/dL (ref 0–99)
Total CHOL/HDL Ratio: 2.3 RATIO
Triglycerides: 187 mg/dL — ABNORMAL HIGH (ref <150)
VLDL: 37 mg/dL (ref 0–40)

## 2018-02-15 LAB — GLUCOSE, CAPILLARY
Glucose-Capillary: 159 mg/dL — ABNORMAL HIGH (ref 70–99)
Glucose-Capillary: 210 mg/dL — ABNORMAL HIGH (ref 70–99)
Glucose-Capillary: 132 mg/dL — ABNORMAL HIGH (ref 70–99)
Glucose-Capillary: 164 mg/dL — ABNORMAL HIGH (ref 70–99)

## 2018-02-15 LAB — HEPATITIS PANEL, ACUTE
HCV Ab: <0.1 s/co ratio (ref 0.0–0.9)
HEP B C IGM: NEGATIVE
Hep A IgM: NEGATIVE
Hepatitis B Surface Ag: NEGATIVE

## 2018-02-15 LAB — HEPATIC FUNCTION PANEL
ALK PHOS: 88 U/L (ref 38–126)
ALT: 143 U/L — AB (ref 0–44)
AST: 182 U/L — ABNORMAL HIGH (ref 15–41)
Albumin: 3.6 g/dL (ref 3.5–5.0)
Bilirubin, Direct: 0.7 mg/dL — ABNORMAL HIGH (ref 0.0–0.2)
Indirect Bilirubin: 3.6 mg/dL — ABNORMAL HIGH (ref 0.3–0.9)
Total Bilirubin: 4.3 mg/dL — ABNORMAL HIGH (ref 0.3–1.2)
Total Protein: 6.6 g/dL (ref 6.5–8.1)

## 2018-02-15 LAB — PROTIME-INR
INR: 1.02
Prothrombin Time: 13.3 seconds (ref 11.4–15.2)

## 2018-02-15 LAB — HEMOGLOBIN A1C
HEMOGLOBIN A1C: 6.5 % — AB (ref 4.8–5.6)
Mean Plasma Glucose: 139.85 mg/dL

## 2018-02-15 LAB — MAGNESIUM
MAGNESIUM: 2.2 mg/dL (ref 1.7–2.4)
Magnesium: 1.1 mg/dL — ABNORMAL LOW (ref 1.7–2.4)
Magnesium: 1.1 mg/dL — ABNORMAL LOW (ref 1.7–2.4)

## 2018-02-15 LAB — SEDIMENTATION RATE: Sed Rate: 2 mm/hr (ref 0–30)

## 2018-02-15 LAB — C-REACTIVE PROTEIN: CRP: <0.8 mg/dL (ref <1.0)

## 2018-02-15 MED ORDER — ADULT MULTIVITAMIN W/MINERALS CH
1.0000 | ORAL_TABLET | Freq: Every day | ORAL | Status: DC
  Administered 2018-02-15 – 2018-02-16 (×2): 1 via ORAL
  Filled 2018-02-15 (×2): qty 1

## 2018-02-15 MED ORDER — ACETAMINOPHEN 650 MG RE SUPP: 650.0000 mg | RECTAL | Status: DC | PRN

## 2018-02-15 MED ORDER — SUCRALFATE 1 G PO TABS
1.0000 g | ORAL_TABLET | Freq: Two times a day (BID) | ORAL | Status: DC
  Administered 2018-02-15 – 2018-02-16 (×2): 1 g via ORAL
  Filled 2018-02-15 (×2): qty 1

## 2018-02-15 MED ORDER — POTASSIUM CHLORIDE CRYS ER 20 MEQ PO TBCR
40.0000 meq | EXTENDED_RELEASE_TABLET | Freq: Once | ORAL | Status: AC
  Administered 2018-02-15: 40 meq via ORAL
  Filled 2018-02-15: qty 2

## 2018-02-15 MED ORDER — THIAMINE HCL 100 MG/ML IJ SOLN: 100.0000 mg | Freq: Every day | INTRAMUSCULAR | Status: DC

## 2018-02-15 MED ORDER — ENOXAPARIN SODIUM 40 MG/0.4ML ~~LOC~~ SOLN
40.0000 mg | SUBCUTANEOUS | Status: DC
  Administered 2018-02-15 (×2): 40 mg via SUBCUTANEOUS
  Filled 2018-02-15 (×2): qty 0.4

## 2018-02-15 MED ORDER — METFORMIN HCL ER 500 MG PO TB24: 500.0000 mg | ORAL_TABLET | Freq: Two times a day (BID) | ORAL | Status: DC

## 2018-02-15 MED ORDER — POTASSIUM CHLORIDE 10 MEQ/100ML IV SOLN
10.0000 meq | INTRAVENOUS | Status: AC
  Administered 2018-02-15 (×2): 10 meq via INTRAVENOUS
  Filled 2018-02-15 (×3): qty 100

## 2018-02-15 MED ORDER — FOLIC ACID 1 MG PO TABS
1.0000 mg | ORAL_TABLET | Freq: Every day | ORAL | Status: DC
  Administered 2018-02-15 – 2018-02-16 (×2): 1 mg via ORAL
  Filled 2018-02-15 (×2): qty 1

## 2018-02-15 MED ORDER — VITAMIN B-12 1000 MCG PO TABS
1000.0000 ug | ORAL_TABLET | Freq: Every day | ORAL | Status: DC
  Administered 2018-02-15 – 2018-02-16 (×2): 1000 ug via ORAL
  Filled 2018-02-15 (×2): qty 1

## 2018-02-15 MED ORDER — LOSARTAN POTASSIUM 25 MG PO TABS: 25.0000 mg | ORAL_TABLET | Freq: Every day | ORAL | Status: DC

## 2018-02-15 MED ORDER — LATANOPROST 0.005 % OP SOLN
1.0000 [drp] | Freq: Every day | OPHTHALMIC | Status: DC
  Administered 2018-02-15: 1 [drp] via OPHTHALMIC
  Filled 2018-02-15: qty 2.5

## 2018-02-15 MED ORDER — LORAZEPAM 1 MG PO TABS: 1.0000 mg | ORAL_TABLET | Freq: Four times a day (QID) | ORAL | Status: DC | PRN

## 2018-02-15 MED ORDER — ACETAMINOPHEN 325 MG PO TABS
650.0000 mg | ORAL_TABLET | ORAL | Status: DC | PRN
  Filled 2018-02-15: qty 2

## 2018-02-15 MED ORDER — DORZOLAMIDE HCL 2 % OP SOLN
1.0000 [drp] | Freq: Two times a day (BID) | OPHTHALMIC | Status: DC
  Filled 2018-02-15: qty 10

## 2018-02-15 MED ORDER — VITAMIN B-1 100 MG PO TABS
100.0000 mg | ORAL_TABLET | Freq: Every day | ORAL | Status: DC
  Administered 2018-02-15 – 2018-02-16 (×2): 100 mg via ORAL
  Filled 2018-02-15 (×2): qty 1

## 2018-02-15 MED ORDER — INSULIN ASPART 100 UNIT/ML ~~LOC~~ SOLN: 0.0000 [IU] | Freq: Every day | SUBCUTANEOUS | Status: DC

## 2018-02-15 MED ORDER — STROKE: EARLY STAGES OF RECOVERY BOOK
Freq: Once | Status: AC
  Administered 2018-02-15

## 2018-02-15 MED ORDER — PRAVASTATIN SODIUM 20 MG PO TABS
10.0000 mg | ORAL_TABLET | Freq: Every day | ORAL | Status: DC
  Administered 2018-02-15: 18:00:00 10 mg via ORAL
  Filled 2018-02-15: qty 1

## 2018-02-15 MED ORDER — LORAZEPAM 2 MG/ML IJ SOLN: 1.0000 mg | Freq: Four times a day (QID) | INTRAMUSCULAR | Status: DC | PRN

## 2018-02-15 MED ORDER — PANTOPRAZOLE SODIUM 40 MG PO TBEC
40.0000 mg | DELAYED_RELEASE_TABLET | Freq: Every day | ORAL | Status: DC
  Administered 2018-02-15 – 2018-02-16 (×2): 40 mg via ORAL
  Filled 2018-02-15 (×2): qty 1

## 2018-02-15 MED ORDER — ALLOPURINOL 100 MG PO TABS
100.0000 mg | ORAL_TABLET | Freq: Every day | ORAL | Status: DC
  Administered 2018-02-15 – 2018-02-16 (×2): 100 mg via ORAL
  Filled 2018-02-15 (×2): qty 1

## 2018-02-15 MED ORDER — POTASSIUM CHLORIDE 10 MEQ/100ML IV SOLN
10.0000 meq | Freq: Once | INTRAVENOUS | Status: AC
  Administered 2018-02-15: 10 meq via INTRAVENOUS

## 2018-02-15 MED ORDER — MAGNESIUM OXIDE 400 (241.3 MG) MG PO TABS
400.0000 mg | ORAL_TABLET | Freq: Two times a day (BID) | ORAL | Status: DC
  Administered 2018-02-15 – 2018-02-16 (×3): 400 mg via ORAL
  Filled 2018-02-15 (×3): qty 1

## 2018-02-15 MED ORDER — POTASSIUM CHLORIDE CRYS ER 20 MEQ PO TBCR: 40.0000 meq | EXTENDED_RELEASE_TABLET | Freq: Two times a day (BID) | ORAL | Status: DC

## 2018-02-15 MED ORDER — SODIUM CHLORIDE 0.9 % IV SOLN
INTRAVENOUS | Status: DC | PRN
  Administered 2018-02-15: 07:00:00 250 mL via INTRAVENOUS

## 2018-02-15 MED ORDER — MESALAMINE 1.2 G PO TBEC
2.4000 g | DELAYED_RELEASE_TABLET | Freq: Every day | ORAL | Status: DC
  Filled 2018-02-15: qty 2

## 2018-02-15 MED ORDER — ACETAMINOPHEN 160 MG/5ML PO SOLN
650.0000 mg | ORAL | Status: DC | PRN
  Filled 2018-02-15: qty 20.3

## 2018-02-15 MED ORDER — INSULIN ASPART 100 UNIT/ML ~~LOC~~ SOLN
0.0000 [IU] | Freq: Three times a day (TID) | SUBCUTANEOUS | Status: DC
  Administered 2018-02-15: 1 [IU] via SUBCUTANEOUS
  Administered 2018-02-15: 3 [IU] via SUBCUTANEOUS
  Administered 2018-02-16: 09:00:00 2 [IU] via SUBCUTANEOUS
  Administered 2018-02-16: 12:00:00 1 [IU] via SUBCUTANEOUS
  Filled 2018-02-15 (×4): qty 1

## 2018-02-15 MED ORDER — SODIUM CHLORIDE 0.9 % IV SOLN
INTRAVENOUS | Status: DC | PRN
  Administered 2018-02-15: 14:00:00 via INTRAVENOUS

## 2018-02-15 MED ORDER — MAGNESIUM SULFATE 4 GM/100ML IV SOLN
4.0000 g | Freq: Once | INTRAVENOUS | Status: AC
  Administered 2018-02-15: 4 g via INTRAVENOUS
  Filled 2018-02-15: qty 100

## 2018-02-15 MED ORDER — METOPROLOL SUCCINATE ER 50 MG PO TB24
50.0000 mg | ORAL_TABLET | Freq: Every day | ORAL | Status: DC
  Filled 2018-02-15: qty 1

## 2018-02-15 MED ORDER — BRINZOLAMIDE 1 % OP SUSP
1.0000 [drp] | Freq: Two times a day (BID) | OPHTHALMIC | Status: DC
  Administered 2018-02-15 – 2018-02-16 (×2): 1 [drp] via OPHTHALMIC
  Filled 2018-02-15: qty 10

## 2018-02-15 MED ORDER — GABAPENTIN 300 MG PO CAPS
300.0000 mg | ORAL_CAPSULE | Freq: Three times a day (TID) | ORAL | Status: DC
  Administered 2018-02-15 – 2018-02-16 (×3): 300 mg via ORAL
  Filled 2018-02-15 (×3): qty 1

## 2018-02-15 MED ORDER — CHLORTHALIDONE 25 MG PO TABS
25.0000 mg | ORAL_TABLET | Freq: Every day | ORAL | Status: DC
  Administered 2018-02-15 – 2018-02-16 (×2): 25 mg via ORAL
  Filled 2018-02-15 (×2): qty 1

## 2018-02-15 NOTE — Consult Note (Addendum)
GI Inpatient Consult Note  Reason for Consult: Elevated LFTs, diarrhea   Attending Requesting Consult: Dr. Margaretmary Eddy, MD  History of Present Illness: Sheila [name] is a 58 y.o. female seen for evaluation of elevated LFTs, diarrhea at the request of Dr. Margaretmary Eddy. Pt has a PMH of obesity, DM, HTN, HLD, COPD, chronic ulcerative colitis, fatty liver, and alcohol abuse. She was admitted yesterday for generalized abd pain, nausea, vomiting, and right-sided lower extremity weakness. She was also found to have elevated LFTs yesterday - AST 650, ALT 262, TBili 4.9; repeat labs showed AST 274, ALT 184, TBili 5.9 (Indirect 5.0). Her abdominal pain and nausea/vomting has resolved upon seeing the patient this afternoon. She reports her pain complaint is diarrhea, which seems to have started several days ago. She reports upon coming to the ED, having >10 loose, brown, watery, green BMs daily with some associated urgency. She denies bloody or tarry stools. Of note, her insurance stopped covering her Apriso ER medication so she was switched last week to Lialda 2.4g daily. She reports since starting this new medication, the diarrhea seems to have gotten worse. She denies any sick contacts or recent travel. She denies any associated abdominal pain or cramping with BMs. When she was having her abdominal pain, it was located in RLQ. No nocturnal diarrhea. GI PCR and c diff are pending and not collected - apparently had urine in stool sample so was not sent down for analysis. She reports she is feeling well today and wants to go home. Appetite has been stable. She does report continued alcohol consumption (per chart review, she told MD that she drinks a pint of alcohol daily on a good day, and double that on a bad day). Patient is adamant that her alcohol intake is not causing her abd pain.   Previous work-up over the past month has included multiple imaging modalities. MRCP with a small hypervascular lesion in the liver with  recommended repeat MRI in 6 months.  There was also mild lymphadenopathy in the porta hepatis report is likely reactive.  A CT scan 01/2017 showed mild gallbladder wall thickening versus pericholecystic fluid and increased number of small lymph nodes in the central small bowel mesentery with mesenteric edema and a small amount of free fluid.  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  She has been evaluated by surgery with normal HIDA scan. She denies any current RUQ abd pain.   Last Colonoscopy: 12/2015 Last Endoscopy: 11/2017   Past Medical History:  Past Medical History:  Diagnosis Date  . Anemia   . Arthritis   . Asthma   . COPD (chronic obstructive pulmonary disease) ([place])   . DDD (degenerative disc disease), lumbar   . Diabetes mellitus without complication ([place])    on metformin  . DJD (degenerative joint disease)   . Dysrhythmia   . Fatty liver   . Fibroids   . GERD (gastroesophageal reflux disease)   . Glaucoma   . Hyperlipidemia   . Hypertension   . IBS (irritable bowel syndrome)   . Lumbago   . Neuromuscular disorder ([place])   . 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[place]) 92/1194  . Renal disorder   . Ulcerative colitis ([place])    on mesalamine    Problem List: Patient Active Problem List   Diagnosis Date Noted  . Right leg weakness 02/14/2018  . Elevated bilirubin 02/14/2018  . SIRS (systemic inflammatory response syndrome) ([place]) 01/30/2018  . Hypoglycemia 01/30/2018  . Lactic acidosis 01/30/2018  . Alcohol abuse 01/30/2018  .  Abdominal pain, chronic, epigastric   . Pancreatitis 08/29/2017  . Diabetes ([place]) 08/29/2017  . HTN (hypertension) 08/29/2017  . HLD (hyperlipidemia) 08/29/2017  . GERD (gastroesophageal reflux disease) 08/29/2017  . Hypokalemia 08/29/2017  . Hypocalcemia 08/29/2017  . Primary osteoarthritis of right knee 01/14/2015    Past Surgical History: Past Surgical History:  Procedure Laterality Date  . COLONOSCOPY WITH PROPOFOL N/A 08/19/2015   Procedure:  COLONOSCOPY WITH PROPOFOL;  Surgeon: Lollie Sails, MD;  Location: [facility] ENDOSCOPY;  Service: Endoscopy;  Laterality: N/A;  . COLONOSCOPY WITH PROPOFOL N/A 12/08/2015   Procedure: COLONOSCOPY WITH PROPOFOL;  Surgeon: Lollie Sails, MD;  Location: [facility] ENDOSCOPY;  Service: Endoscopy;  Laterality: N/A;  . COLONOSCOPY WITH PROPOFOL N/A 12/09/2015   Procedure: COLONOSCOPY WITH PROPOFOL;  Surgeon: Lollie Sails, MD;  Location: [facility] ENDOSCOPY;  Service: Endoscopy;  Laterality: N/A;  . ESOPHAGOGASTRODUODENOSCOPY (EGD) WITH PROPOFOL N/A 11/08/2017   Procedure: ESOPHAGOGASTRODUODENOSCOPY (EGD) WITH PROPOFOL;  Surgeon: Lollie Sails, MD;  Location: [facility] ENDOSCOPY;  Service: Endoscopy;  Laterality: N/A;  . JOINT REPLACEMENT Left 04/19/2006  . JOINT REPLACEMENT Right 01/14/2015  . TOTAL KNEE ARTHROPLASTY Left 2008  . TOTAL KNEE ARTHROPLASTY Right 01/14/2015   Procedure: TOTAL KNEE ARTHROPLASTY;  Surgeon: Hessie Knows, MD;  Location: [facility] ORS;  Service: Orthopedics;  Laterality: Right;    Allergies: Allergies  Allergen Reactions  . Ampicillin Anaphylaxis  . Erythromycin Anaphylaxis  . Omeprazole Anaphylaxis  . Penicillins Anaphylaxis and Other (See Comments)    Has patient had a PCN reaction causing immediate rash, facial/tongue/throat swelling, SOB or lightheadedness with hypotension: Yes Has patient had a PCN reaction causing severe rash involving mucus membranes or skin necrosis: No Has patient had a PCN reaction that required hospitalization: Unknown Has patient had a PCN reaction occurring within the last 10 years: Yes If all of the above answers are "NO", then may proceed with Cephalosporin use.   . Triamterene-Hctz Shortness Of Breath and Swelling  . Quinapril Cough    Home Medications: Medications Prior to Admission  Medication Sig Dispense Refill Last Dose  . acetaminophen (TYLENOL) 500 MG tablet Take 1,000 mg by mouth every 8 (eight) hours as needed for mild pain.   prn at  prn  . albuterol (PROVENTIL HFA;VENTOLIN HFA) 108 (90 BASE) MCG/ACT inhaler Inhale 2 puffs into the lungs every 4 (four) hours as needed for wheezing or shortness of breath.   prn at prn  . allopurinol (ZYLOPRIM) 100 MG tablet Take 100 mg by mouth daily.  3 02/14/2018 at Unknown time  . KLOR-CON M20 20 MEQ tablet Take 2 tablets (40 mEq total) by mouth daily. 6 tablet 0 02/14/2018 at Unknown time  . losartan (COZAAR) 25 MG tablet Take 25 mg by mouth daily.    02/14/2018 at Unknown time  . mesalamine (LIALDA) 1.2 g EC tablet Take 2.4 g by mouth daily with breakfast.   02/14/2018 at Unknown time  . metoprolol succinate (TOPROL-XL) 50 MG 24 hr tablet Take 50 mg by mouth at bedtime.   02/14/2018 at 1230  . triamcinolone (KENALOG) 0.025 % cream Apply 1 application topically 2 (two) times daily.   Past Week at Unknown time  . bimatoprost (LUMIGAN) 0.03 % ophthalmic solution Place 1 drop into both eyes at bedtime.   08/29/2017 at Unknown time  . brinzolamide (AZOPT) 1 % ophthalmic suspension Place 1 drop into both eyes 2 (two) times daily.   02/13/2018 at Unknown time  . chlorthalidone (HYGROTON) 25 MG tablet Take 25  mg by mouth daily.   3 02/13/2018 at Unknown time  . [employer] VITAMIN B12 1000 MCG tablet Take 1,000 mcg by mouth daily.  3 02/13/2018 at Unknown time  . dorzolamide (TRUSOPT) 2 % ophthalmic solution Place 1 drop into both eyes 2 (two) times daily.  4 08/29/2017 at Unknown time  . gabapentin (NEURONTIN) 300 MG capsule Take 300 mg by mouth 3 (three) times daily.     Marland Kitchen latanoprost (XALATAN) 0.005 % ophthalmic solution Place 1 drop into both eyes at bedtime.   02/13/2018 at Unknown time  . linagliptin (TRADJENTA) 5 MG TABS tablet Take 1 tablet (5 mg total) by mouth daily. 30 tablet 0   . Magnesium Oxide (MAG-OXIDE) 200 MG TABS Take 2 tablets (400 mg total) by mouth 2 (two) times daily. 60 tablet 0   . metFORMIN (GLUCOPHAGE-XR) 500 MG 24 hr tablet Take 500 mg by mouth 2 (two) times daily.   02/13/2018 at Unknown  time  . metoprolol succinate (TOPROL-XL) 25 MG 24 hr tablet Take 1 tablet (25 mg total) by mouth daily. (Patient not taking: Reported on 01/29/2018) 30 tablet 0 Not Taking at Unknown time  . ondansetron (ZOFRAN) 4 MG tablet Take 1 tablet (4 mg total) by mouth every 8 (eight) hours as needed for nausea or vomiting. 8 tablet 0 prn at prn  . ondansetron (ZOFRAN) 4 MG tablet Take 1 tablet (4 mg total) by mouth every 6 (six) hours as needed for nausea. 20 tablet 0   . pantoprazole (PROTONIX) 40 MG tablet Take 40 mg by mouth every morning.   02/13/2018 at Unknown time  . potassium phosphate, monobasic, (K-PHOS) 500 MG tablet Take 1 tablet (500 mg total) by mouth 4 (four) times daily -  with meals and at bedtime. 3 tablet 0   . pravastatin (PRAVACHOL) 10 MG tablet Take 10 mg by mouth at bedtime.   02/13/2018 at Unknown time  . sucralfate (CARAFATE) 1 g tablet Take 1 g by mouth 2 (two) times daily.      Home medication reconciliation was completed with the patient.   Scheduled Inpatient Medications:   . allopurinol  100 mg Oral Daily  . brinzolamide  1 drop Both Eyes BID  . chlorthalidone  25 mg Oral Daily  . dorzolamide  1 drop Both Eyes BID  . enoxaparin (LOVENOX) injection  40 mg Subcutaneous Q24H  . folic acid  1 mg Oral Daily  . gabapentin  300 mg Oral TID  . insulin aspart  0-5 Units Subcutaneous QHS  . insulin aspart  0-9 Units Subcutaneous TID WC  . latanoprost  1 drop Both Eyes QHS  . magnesium oxide  400 mg Oral BID  . [START ON 02/16/2018] mesalamine  2.4 g Oral Q breakfast  . [START ON 02/17/2018] metFORMIN  500 mg Oral BID WC  . metoprolol succinate  50 mg Oral QHS  . multivitamin with minerals  1 tablet Oral Daily  . pantoprazole  40 mg Oral Daily  . potassium chloride  40 mEq Oral BID  . pravastatin  10 mg Oral q1800  . sucralfate  1 g Oral BID  . thiamine  100 mg Oral Daily   Or  . thiamine  100 mg Intravenous Daily  . vitamin B-12  1,000 mcg Oral Daily    Continuous  Inpatient Infusions:   . sodium chloride      PRN Inpatient Medications:  sodium chloride, acetaminophen **OR** acetaminophen (TYLENOL) oral liquid 160 mg/5 mL **OR** acetaminophen,  LORazepam **OR** LORazepam  Family History: family history includes Diabetes in her brother and father; Hypertension in her brother, father, and mother.  The patient's family history is negative for inflammatory bowel disorders, GI malignancy, or solid organ transplantation.  Social History:   reports that she quit smoking about 40 years ago. She has never used smokeless tobacco. She reports current alcohol use. She reports that she does not use drugs. The patient denies ETOH, tobacco, or drug use.   Review of Systems: Constitutional: Weight is stable.  Eyes: No changes in vision. ENT: No oral lesions, sore throat.  GI: see HPI.  Heme/Lymph: No easy bruising.  CV: No chest pain.  GU: No hematuria.  Integumentary: No rashes.  Neuro: No headaches.  Psych: No depression/anxiety.  Endocrine: No heat/cold intolerance.  Allergic/Immunologic: No urticaria.  Resp: No cough, SOB.  Musculoskeletal: No joint swelling.    Physical Examination: BP 96/62   Pulse 87   Temp 98 F (36.7 C) (Oral)   Resp 18   Ht 5' (1.524 m)   Wt 75 kg   LMP  (LMP Unknown) Comment: LMP was in her 62's, greater than 2 years ago.  SpO2 95%   BMI 32.28 kg/m  Gen: NAD, alert and oriented x 4 HEENT: PEERLA, EOMI, Neck: supple, no JVD or thyromegaly Chest: CTA bilaterally, no wheezes, crackles, or other adventitious sounds CV: RRR, no m/g/c/r Abd: soft, NT, ND, +BS in all four quadrants; no HSM, guarding, ridigity, or rebound tenderness Ext: no edema, well perfused with 2+ pulses, Skin: no rash or lesions noted Lymph: no LAD  Data: Lab Results  Component Value Date   WBC 11.1 (H) 02/14/2018   HGB 13.4 02/14/2018   HCT 40.9 02/14/2018   MCV 80.7 02/14/2018   PLT 353 02/14/2018   Recent Labs  Lab 02/14/18 0152  02/14/18 1934  HGB 14.3 13.4   Lab Results  Component Value Date   NA 137 02/15/2018   K 3.3 (L) 02/15/2018   CL 102 02/15/2018   CO2 25 02/15/2018   BUN 12 02/15/2018   CREATININE 0.62 02/15/2018   Lab Results  Component Value Date   ALT 143 (H) 02/15/2018   AST 182 (H) 02/15/2018   ALKPHOS 88 02/15/2018   BILITOT 4.3 (H) 02/15/2018   No results for input(s): APTT, INR, PTT in the last 168 hours. Assessment/Plan:  58 y/o AA female with a PMH of HTN, HLD, DM, COPD, DDD, fatty liver admitted for multiple complaints  1. Chronic ulcerative colitis 2. Diarrhea  - Recent change in medication from [facility] ER 1.5 g daily to Lialda 2.4 gram daily likely caused diarrhea. - We need to rule out infectious etiology with GI PCR and c diff.  - We will check fecal calprotectin, ESR, CRP - Continue symptomatic management - We will await stool studies. If diarrhea worsens or fails to improve, could consider flexible sigmoidoscopy.  - F/u with GI as an outpatient as scheduled at [facility]  3. Alcohol abuse 4. Elevated LFTs - Her LFTs are consistent with persistent alcohol consumption - We will check PT/INR and then check the Maddrey discriminant function. If >32, will treat for alcoholic hepatitis  - Recheck LFTs tomorrow - We discussed importance of alcohol abstinence    Thank you for the consult. Please call with questions or concerns.  Geanie Kenning, PA-C [facility]  [phone]

## 2018-02-15 NOTE — Progress Notes (Signed)
PHARMACY CONSULT NOTE - FOLLOW UP  Pharmacy Consult for Electrolyte Monitoring and Replacement   Recent Labs: Potassium (mmol/L)  Date Value  02/15/2018 3.4 (L)  11/12/2013 3.9   Magnesium (mg/dL)  Date Value  02/15/2018 2.2   Calcium (mg/dL)  Date Value  02/15/2018 7.6 (L)   Calcium, Total (mg/dL)  Date Value  11/12/2013 6.1 (LL)   Albumin (g/dL)  Date Value  02/15/2018 3.6  10/11/2012 3.9   Phosphorus (mg/dL)  Date Value  01/31/2018 <1.0 (LL)   Sodium (mmol/L)  Date Value  02/15/2018 136  11/12/2013 147 (H)     Assessment: Patient is a 58yo female admitted for LE numbness and weakness. Patient with recent bout of N/V/D and now with hypokalemia and hypomagnesemia.  Plan:  2/12 K 3.4 - patient received KCl 48mq PO times one this morning. Due to UC causing diarrhea.  Will order KCl IV 10 mEq every 1 hour x 3 doses.  Will check BMP in AM with morning labs  Mag 2.2 and WNL.  CEvelena Asa PharmD Clinical Pharmacist 02/15/2018 6:52 PM

## 2018-02-15 NOTE — Progress Notes (Signed)
SLP Cancellation Note  Patient Details Name: Sheila [name] MRN: [id number] DOB: 07-07-1960   Cancelled treatment:       Reason Eval/Treat Not Completed: SLP screened, no needs identified, will sign off(chart reviewed; consulted NSG then met w/ pt/family). Pt denied any difficulty swallowing and is currently on a regular diet; tolerates swallowing pills w/ water per NSG. Pt conversed at conversational level w/out deficits noted; pt and family denied any speech-language deficits.  No further skilled ST services indicated as pt appears at her baseline. Pt agreed. NSG to reconsult if any change in status.     Orinda Kenner, MS, CCC-SLP Watson,Katherine 02/15/2018, 12:16 PM

## 2018-02-15 NOTE — Progress Notes (Addendum)
[facility] NAME: Sheila [name]    MR#:  [id number]  DATE OF BIRTH:  May 22, 1960  SUBJECTIVE:  CHIEF COMPLAINT: Patient denies any nausea vomiting today but still having diarrhea.  Denies any abdominal pain.  Reporting right lower extremity calf pain, denies any trauma  REVIEW OF SYSTEMS:  CONSTITUTIONAL: No fever, fatigue or weakness.  EYES: No blurred or double vision.  EARS, NOSE, AND THROAT: No tinnitus or ear pain.  RESPIRATORY: No cough, shortness of breath, wheezing or hemoptysis.  CARDIOVASCULAR: No chest pain, orthopnea, edema.  GASTROINTESTINAL: No nausea, vomiting, diarrhea or abdominal pain.  GENITOURINARY: No dysuria, hematuria.  ENDOCRINE: No polyuria, nocturia,  HEMATOLOGY: No anemia, easy bruising or bleeding SKIN: No rash or lesion. MUSCULOSKELETAL: No joint pain or arthritis.   NEUROLOGIC: No tingling, numbness, weakness.  PSYCHIATRY: No anxiety or depression.   DRUG ALLERGIES:   Allergies  Allergen Reactions  . Ampicillin Anaphylaxis  . Erythromycin Anaphylaxis  . Omeprazole Anaphylaxis  . Penicillins Anaphylaxis and Other (See Comments)    Has patient had a PCN reaction causing immediate rash, facial/tongue/throat swelling, SOB or lightheadedness with hypotension: Yes Has patient had a PCN reaction causing severe rash involving mucus membranes or skin necrosis: No Has patient had a PCN reaction that required hospitalization: Unknown Has patient had a PCN reaction occurring within the last 10 years: Yes If all of the above answers are "NO", then may proceed with Cephalosporin use.   . Triamterene-Hctz Shortness Of Breath and Swelling  . Quinapril Cough    VITALS:  Blood pressure 96/62, pulse 87, temperature 98 F (36.7 C), temperature source Oral, resp. rate 18, height 5' (1.524 m), weight 75 kg, SpO2 95 %.  PHYSICAL EXAMINATION:  GENERAL:  58 y.o.-year-old patient lying in the bed with no  acute distress.  EYES: Pupils equal, round, reactive to light and accommodation. No scleral icterus. Extraocular muscles intact.  HEENT: Head atraumatic, normocephalic. Oropharynx and nasopharynx clear.  NECK:  Supple, no jugular venous distention. No thyroid enlargement, no tenderness.  LUNGS: Normal breath sounds bilaterally, no wheezing, rales,rhonchi or crepitation. No use of accessory muscles of respiration.  CARDIOVASCULAR: S1, S2 normal. No murmurs, rubs, or gallops.  ABDOMEN: Soft, nontender, nondistended. Bowel sounds present.  EXTREMITIES: No pedal edema, cyanosis, or clubbing.  Right leg calf muscle is tender, not warm to touch NEUROLOGIC: Awake, alert and oriented x3 sensation intact. Gait not checked.  PSYCHIATRIC: The patient is alert and oriented x 3.  SKIN: No obvious rash, lesion, or ulcer.    LABORATORY PANEL:   CBC Recent Labs  Lab 02/14/18 1934  WBC 11.1*  HGB 13.4  HCT 40.9  PLT 353   ------------------------------------------------------------------------------------------------------------------  Chemistries  Recent Labs  Lab 02/15/18 0408 02/15/18 0707  NA  --  137  K  --  3.3*  CL  --  102  CO2  --  25  GLUCOSE  --  145*  BUN  --  12  CREATININE  --  0.62  CALCIUM  --  7.1*  MG  --  1.1*  AST 182*  --   ALT 143*  --   ALKPHOS 88  --   BILITOT 4.3*  --    ------------------------------------------------------------------------------------------------------------------  Cardiac Enzymes Recent Labs  Lab 02/14/18 1934  TROPONINI <0.03   ------------------------------------------------------------------------------------------------------------------  RADIOLOGY:  Ct Head Wo Contrast  Result Date: 02/14/2018 CLINICAL DATA:  Lower extremity weakness, numbness. EXAM: CT HEAD WITHOUT CONTRAST TECHNIQUE:  Contiguous axial images were obtained from the base of the skull through the vertex without intravenous contrast. COMPARISON:  07/03/2007  FINDINGS: Brain: No acute intracranial abnormality. Specifically, no hemorrhage, hydrocephalus, mass lesion, acute infarction, or significant intracranial injury. Vascular: No hyperdense vessel or unexpected calcification. Skull: No acute calvarial abnormality. Sinuses/Orbits: Visualized paranasal sinuses and mastoids clear. Orbital soft tissues unremarkable. Other: None IMPRESSION: No acute intracranial abnormality. Electronically Signed   By: Rolm Baptise M.D.   On: 02/14/2018 21:48   Mr Brain Wo Contrast  Result Date: 02/15/2018 CLINICAL DATA:  Right leg numbness and weakness. EXAM: MRI HEAD WITHOUT CONTRAST MRA HEAD WITHOUT CONTRAST TECHNIQUE: Multiplanar, multiecho pulse sequences of the brain and surrounding structures were obtained without intravenous contrast. Angiographic images of the head were obtained using MRA technique without contrast. COMPARISON:  Head CT 02/14/2018 FINDINGS: MRI HEAD FINDINGS Brain: There is no evidence of acute infarct, intracranial hemorrhage, mass, midline shift, or extra-axial fluid collection. There is very mild cerebral atrophy. Cerebral white matter T2 hyperintensities are most notable in the periventricular regions and are nonspecific but compatible with mild chronic small vessel ischemic disease. Vascular: Major intracranial vascular flow voids are preserved. Skull and upper cervical spine: Unremarkable bone marrow signal. Sinuses/Orbits: Unremarkable orbits. Paranasal sinuses and mastoid air cells are clear. Other: None. MRA HEAD FINDINGS There is mild motion artifact, and there is also increased image noise posteriorly. The visualized distal vertebral arteries are widely patent to the basilar and codominant. Patent left PICA and bilateral SCA origins are identified. Right PICA and AICAs are not well seen. The basilar artery is widely patent. PCAs are patent with artifact through the distal branch vessels and no evidence of significant proximal stenosis. The internal  carotid arteries are patent from skull base to carotid termini without evidence of significant stenosis. ACAs and MCAs are patent without evidence of proximal branch occlusion or significant stenosis. No aneurysm is identified. IMPRESSION: 1. No acute intracranial abnormality. 2. Mild chronic small vessel ischemic disease. 3. Negative head MRA. Electronically Signed   By: Logan Bores M.D.   On: 02/15/2018 07:01   Ct Abdomen Pelvis W Contrast  Result Date: 02/14/2018 CLINICAL DATA:  X 58 year old female with history of nausea, vomiting and diarrhea with right lower quadrant abdominal pain. EXAM: CT ABDOMEN AND PELVIS WITH CONTRAST TECHNIQUE: Multidetector CT imaging of the abdomen and pelvis was performed using the standard protocol following bolus administration of intravenous contrast. CONTRAST:  135m OMNIPAQUE IOHEXOL 300 MG/ML SOLN, 372mISOVUE-300 IOPAMIDOL (ISOVUE-300) INJECTION 61% COMPARISON:  CT the abdomen and pelvis 01/29/2018. FINDINGS: Lower chest: Mild scarring in the inferior segment of the lingula. Hepatobiliary: No suspicious cystic or solid hepatic lesions. No intra or extrahepatic biliary ductal dilatation. Gallbladder is normal in appearance. Pancreas: No pancreatic mass. No pancreatic ductal dilatation. No pancreatic or peripancreatic fluid or inflammatory changes. Spleen: Unremarkable. Adrenals/Urinary Tract: Bilateral kidneys and adrenal glands are normal in appearance. No hydroureteronephrosis. Urinary bladder is normal in appearance. Stomach/Bowel: Normal appearance of the stomach. No pathologic dilatation of small bowel or colon. A few scattered colonic diverticulae are noted, without surrounding inflammatory changes to suggest an acute diverticulitis at this time. Normal appendix. Vascular/Lymphatic: Aortic atherosclerosis, without evidence of aneurysm or dissection in the abdominal or pelvic vasculature. No lymphadenopathy noted in the abdomen or pelvis. Reproductive: Uterus is  mildly enlarged and heterogeneous in appearance containing multiple internal lesions, several of which are partially calcified, presumably fibroid uterus. The largest uterine lesion is in the fundus measuring up  to 4.6 cm in diameter. Ovaries are atrophic. Bilateral tubal ligation clips are incidentally noted. Other: No significant volume of ascites.  No pneumoperitoneum. Musculoskeletal: There are no aggressive appearing lytic or blastic lesions noted in the visualized portions of the skeleton. IMPRESSION: 1. No acute findings are noted in the abdomen or pelvis to account for the patient's symptoms. 2. Normal appendix. 3. Mild colonic diverticulosis without evidence of acute diverticulitis at this time. 4. Fibroid uterus. 5. Aortic atherosclerosis. Electronically Signed   By: Vinnie Langton M.D.   On: 02/14/2018 06:47   US Venous Img Lower Unilateral Right  Result Date: 02/15/2018 CLINICAL DATA:  Calf tenderness.  Evaluate for DVT. EXAM: RIGHT LOWER EXTREMITY VENOUS DOPPLER ULTRASOUND TECHNIQUE: Gray-scale sonography with graded compression, as well as color Doppler and duplex ultrasound were performed to evaluate the lower extremity deep venous systems from the level of the common femoral vein and including the common femoral, femoral, profunda femoral, popliteal and calf veins including the posterior tibial, peroneal and gastrocnemius veins when visible. The superficial great saphenous vein was also interrogated. Spectral Doppler was utilized to evaluate flow at rest and with distal augmentation maneuvers in the common femoral, femoral and popliteal veins. COMPARISON:  None. FINDINGS: Contralateral Common Femoral Vein: Respiratory phasicity is normal and symmetric with the symptomatic side. No evidence of thrombus. Normal compressibility. Common Femoral Vein: No evidence of thrombus. Normal compressibility, respiratory phasicity and response to augmentation. Saphenofemoral Junction: No evidence of thrombus.  Normal compressibility and flow on color Doppler imaging. Profunda Femoral Vein: No evidence of thrombus. Normal compressibility and flow on color Doppler imaging. Femoral Vein: No evidence of thrombus. Normal compressibility, respiratory phasicity and response to augmentation. Popliteal Vein: No evidence of thrombus. Normal compressibility, respiratory phasicity and response to augmentation. Calf Veins: No evidence of thrombus. Normal compressibility and flow on color Doppler imaging. Superficial Great Saphenous Vein: No evidence of thrombus. Normal compressibility. Venous Reflux:  None. Other Findings:  None. IMPRESSION: No evidence of DVT within the right lower extremity. Electronically Signed   By: Sandi Mariscal M.D.   On: 02/15/2018 12:19   Mr Jodene Nam Head/brain SW Cm  Result Date: 02/15/2018 CLINICAL DATA:  Right leg numbness and weakness. EXAM: MRI HEAD WITHOUT CONTRAST MRA HEAD WITHOUT CONTRAST TECHNIQUE: Multiplanar, multiecho pulse sequences of the brain and surrounding structures were obtained without intravenous contrast. Angiographic images of the head were obtained using MRA technique without contrast. COMPARISON:  Head CT 02/14/2018 FINDINGS: MRI HEAD FINDINGS Brain: There is no evidence of acute infarct, intracranial hemorrhage, mass, midline shift, or extra-axial fluid collection. There is very mild cerebral atrophy. Cerebral white matter T2 hyperintensities are most notable in the periventricular regions and are nonspecific but compatible with mild chronic small vessel ischemic disease. Vascular: Major intracranial vascular flow voids are preserved. Skull and upper cervical spine: Unremarkable bone marrow signal. Sinuses/Orbits: Unremarkable orbits. Paranasal sinuses and mastoid air cells are clear. Other: None. MRA HEAD FINDINGS There is mild motion artifact, and there is also increased image noise posteriorly. The visualized distal vertebral arteries are widely patent to the basilar and codominant.  Patent left PICA and bilateral SCA origins are identified. Right PICA and AICAs are not well seen. The basilar artery is widely patent. PCAs are patent with artifact through the distal branch vessels and no evidence of significant proximal stenosis. The internal carotid arteries are patent from skull base to carotid termini without evidence of significant stenosis. ACAs and MCAs are patent without evidence of proximal branch occlusion  or significant stenosis. No aneurysm is identified. IMPRESSION: 1. No acute intracranial abnormality. 2. Mild chronic small vessel ischemic disease. 3. Negative head MRA. Electronically Signed   By: Logan Bores M.D.   On: 02/15/2018 07:01    EKG:   Orders placed or performed during the hospital encounter of 02/14/18  . ED EKG  . ED EKG    ASSESSMENT AND PLAN:    Right leg weakness -unclear etiology.  Could be from hypokalemia or muscle cramp/other possibilities DVT as patient is reporting calf tenderness CT head and MRI brain are negative Venous Dopplers of right lower extremity Replete potassium and check magnesium level Neurology is following -no need of stroke work-up as the patient is ruled out for stroke.  Discontinue neuro checks and NIH scale    Hypokalemia -replace potassium and monitor, check magnesium level and replete if low  Acute gastroenteritis for the past 3 days-clinically improved with nausea and vomiting tolerating diet but still having diarrhea Check GI panel and C. difficile toxin GI consult placed awaiting for Dr. Alice Reichert he is aware of the consult Hepatitis panel negative Monitor LFTs    Diabetes ([place]) -sliding scale insulin coverage    HTN (hypertension) -patient is hypotensive.  IV fluids, hold antihypertensive Cozaar    HLD (hyperlipidemia) -home dose antilipid    GERD (gastroesophageal reflux disease) -home dose PPI  History of alcohol abuse CIWA protocol and outpatient follow-up with alcohol Anonymous  All the records are  reviewed and case discussed with Care Management/Social Workerr. Management plans discussed with the patient, family and they are in agreement.  CODE STATUS: fc   TOTAL TIME TAKING CARE OF THIS PATIENT:  36 minutes.   POSSIBLE D/C IN -1-2 DAYS, DEPENDING ON CLINICAL CONDITION.  Note: This dictation was prepared with Dragon dictation along with smaller phrase technology. Any transcriptional errors that result from this process are unintentional.   Nicholes Mango M.D on 02/15/2018 at 12:34 PM  Between 7am to 6pm - Pager - [phone] After 6pm go to [email or web address] - password [email or web address] [facility]  Office  [phone]  CC: Primary care physician; Ellamae Sia, MD

## 2018-02-15 NOTE — Progress Notes (Signed)
*  PRELIMINARY RESULTS* Echocardiogram 2D Echocardiogram has been performed.  Sherrie Sport 02/15/2018, 9:49 AM

## 2018-02-15 NOTE — Progress Notes (Signed)
PHARMACY CONSULT NOTE - FOLLOW UP  Pharmacy Consult for Electrolyte Monitoring and Replacement   Recent Labs: Potassium (mmol/L)  Date Value  02/15/2018 3.3 (L)  11/12/2013 3.9   Magnesium (mg/dL)  Date Value  02/15/2018 1.1 (L)   Calcium (mg/dL)  Date Value  02/15/2018 7.1 (L)   Calcium, Total (mg/dL)  Date Value  11/12/2013 6.1 (LL)   Albumin (g/dL)  Date Value  02/15/2018 3.6  10/11/2012 3.9   Phosphorus (mg/dL)  Date Value  01/31/2018 <1.0 (LL)   Sodium (mmol/L)  Date Value  02/15/2018 137  11/12/2013 147 (H)     Assessment: Patient is a 58yo female admitted for LE numbness and weakness. Patient with recent bout of N/V/D and now with hypokalemia and hypomagnesemia.  Plan:  2/12 K 3.3 - patient has already received KCl 37mq PO times one this morning. Mag 1.1 - patient is getting Mag Sulfate 4g IV times one this morning. Will recheck K and Mag at 18:00, replace as indicated. Will follow up on AM labs.  LPaulina Fusi PharmD, BCPS 02/15/2018 10:51 AM

## 2018-02-15 NOTE — Progress Notes (Signed)
OT Cancellation Note  Patient Details Name: Sheila [name] MRN: [id number] DOB: 12-Jul-1960   OT Screen:    Reason Eval/Treat Not Completed: OT screened, no needs identified, will sign off. Order received, chart reviewed. Pt back to baseline functional independence. No skilled OT needs identified. Will sign off. Please re-consult if additional needs arise.   Shara Blazing, M.S., OTR/L Ascom: [phone] 02/15/18, 2:40 PM

## 2018-02-15 NOTE — Consult Note (Signed)
Referring Physician: Nicholes Mango, MD    Chief Complaint: Right leg weakness and numbness  HPI: Sheila [name] is an 58 y.o. female with multiple medical issues as below who initially presented to the ED on 02/14/2018 at 1 am with chief complaints of RLQ pain associated with nausea, vomiting and diarrhea. Initial labs at that time revealed electrolytes imbalance and elevated LFTs likely due to her hx of alcohol abuse. Patient was treated and discharged to follow up with her pcp or GI for further evaluation. Patient returned to the ED the same night with complaints of numbness and weakness in the medial right leg with onset approximately 11 am. Denied associated altered sensorium, speech abnormality, cranial nerve deficit, seizures, diplopia, dizziness, headache, or other associated focal neurological deficit. Initial NIHSS 2. CT head was performed and did not show acute intracranial abnormality. Follow up MRI brain and MRA showed no acute intracranial abnormality or large vessel occlusion.  Due to her stroke risk factors and hx of alcohol abuse,patient was admitted for further work up, evaluation and management. Patient now report that symptoms has resolved with some lingering discomfort with movement.  Date last known well: Date: 02/14/2018 Time last known well: Time: 11:00 tPA Given: No: Non-disabling symptoms. NIHSS 2  Past Medical History:  Diagnosis Date  . Anemia   . Arthritis   . Asthma   . COPD (chronic obstructive pulmonary disease) ([place])   . DDD (degenerative disc disease), lumbar   . Diabetes mellitus without complication ([place])    on metformin  . DJD (degenerative joint disease)   . Dysrhythmia   . Fatty liver   . Fibroids   . GERD (gastroesophageal reflux disease)   . Glaucoma   . Hyperlipidemia   . Hypertension   . IBS (irritable bowel syndrome)   . Lumbago   . Neuromuscular disorder ([place])   . 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[place]) [id number]  . Renal disorder   . Ulcerative colitis  ([place])    on mesalamine    Past Surgical History:  Procedure Laterality Date  . COLONOSCOPY WITH PROPOFOL N/A 08/19/2015   Procedure: COLONOSCOPY WITH PROPOFOL;  Surgeon: Lollie Sails, MD;  Location: [facility] ENDOSCOPY;  Service: Endoscopy;  Laterality: N/A;  . COLONOSCOPY WITH PROPOFOL N/A 12/08/2015   Procedure: COLONOSCOPY WITH PROPOFOL;  Surgeon: Lollie Sails, MD;  Location: [facility] ENDOSCOPY;  Service: Endoscopy;  Laterality: N/A;  . COLONOSCOPY WITH PROPOFOL N/A 12/09/2015   Procedure: COLONOSCOPY WITH PROPOFOL;  Surgeon: Lollie Sails, MD;  Location: [facility] ENDOSCOPY;  Service: Endoscopy;  Laterality: N/A;  . ESOPHAGOGASTRODUODENOSCOPY (EGD) WITH PROPOFOL N/A 11/08/2017   Procedure: ESOPHAGOGASTRODUODENOSCOPY (EGD) WITH PROPOFOL;  Surgeon: Lollie Sails, MD;  Location[facility] ENDOSCOPY;  Service: Endoscopy;  Laterality: N/A;  . JOINT REPLACEMENT Left 04/19/2006  . JOINT REPLACEMENT Right 01/14/2015  . TOTAL KNEE ARTHROPLASTY Left 2008  . TOTAL KNEE ARTHROPLASTY Right 01/14/2015   Procedure: TOTAL KNEE ARTHROPLASTY;  Surgeon: Hessie Knows, MD;  Location: [facility] ORS;  Service: Orthopedics;  Laterality: Right;    Family History  Problem Relation Age of Onset  . Hypertension Mother   . Diabetes Father   . Hypertension Father   . Diabetes Brother   . Hypertension Brother   . Breast cancer Neg Hx    Social History:  reports that she quit smoking about 40 years ago. She has never used smokeless tobacco. She reports current alcohol use. She reports that she does not use drugs.  Allergies:  Allergies  Allergen Reactions  .  Ampicillin Anaphylaxis  . Erythromycin Anaphylaxis  . Omeprazole Anaphylaxis  . Penicillins Anaphylaxis and Other (See Comments)    Has patient had a PCN reaction causing immediate rash, facial/tongue/throat swelling, SOB or lightheadedness with hypotension: Yes Has patient had a PCN reaction causing severe rash involving mucus membranes or skin necrosis:  No Has patient had a PCN reaction that required hospitalization: Unknown Has patient had a PCN reaction occurring within the last 10 years: Yes If all of the above answers are "NO", then may proceed with Cephalosporin use.   . Triamterene-Hctz Shortness Of Breath and Swelling  . Quinapril Cough    Medications:  I have reviewed the patient's current medications. Prior to Admission:  Medications Prior to Admission  Medication Sig Dispense Refill Last Dose  . acetaminophen (TYLENOL) 500 MG tablet Take 1,000 mg by mouth every 8 (eight) hours as needed for mild pain.   prn at prn  . albuterol (PROVENTIL HFA;VENTOLIN HFA) 108 (90 BASE) MCG/ACT inhaler Inhale 2 puffs into the lungs every 4 (four) hours as needed for wheezing or shortness of breath.   prn at prn  . allopurinol (ZYLOPRIM) 100 MG tablet Take 100 mg by mouth daily.  3 02/14/2018 at Unknown time  . KLOR-CON M20 20 MEQ tablet Take 2 tablets (40 mEq total) by mouth daily. 6 tablet 0 02/14/2018 at Unknown time  . losartan (COZAAR) 25 MG tablet Take 25 mg by mouth daily.    02/14/2018 at Unknown time  . mesalamine (LIALDA) 1.2 g EC tablet Take 2.4 g by mouth daily with breakfast.   02/14/2018 at Unknown time  . metoprolol succinate (TOPROL-XL) 50 MG 24 hr tablet Take 50 mg by mouth at bedtime.   02/14/2018 at 1230  . triamcinolone (KENALOG) 0.025 % cream Apply 1 application topically 2 (two) times daily.   Past Week at Unknown time  . bimatoprost (LUMIGAN) 0.03 % ophthalmic solution Place 1 drop into both eyes at bedtime.   08/29/2017 at Unknown time  . brinzolamide (AZOPT) 1 % ophthalmic suspension Place 1 drop into both eyes 2 (two) times daily.   02/13/2018 at Unknown time  . chlorthalidone (HYGROTON) 25 MG tablet Take 25 mg by mouth daily.   3 02/13/2018 at Unknown time  . CVS VITAMIN B12 1000 MCG tablet Take 1,000 mcg by mouth daily.  3 02/13/2018 at Unknown time  . dorzolamide (TRUSOPT) 2 % ophthalmic solution Place 1 drop into both eyes 2  (two) times daily.  4 08/29/2017 at Unknown time  . gabapentin (NEURONTIN) 300 MG capsule Take 300 mg by mouth 3 (three) times daily.     Marland Kitchen latanoprost (XALATAN) 0.005 % ophthalmic solution Place 1 drop into both eyes at bedtime.   02/13/2018 at Unknown time  . linagliptin (TRADJENTA) 5 MG TABS tablet Take 1 tablet (5 mg total) by mouth daily. 30 tablet 0   . Magnesium Oxide (MAG-OXIDE) 200 MG TABS Take 2 tablets (400 mg total) by mouth 2 (two) times daily. 60 tablet 0   . metFORMIN (GLUCOPHAGE-XR) 500 MG 24 hr tablet Take 500 mg by mouth 2 (two) times daily.   02/13/2018 at Unknown time  . metoprolol succinate (TOPROL-XL) 25 MG 24 hr tablet Take 1 tablet (25 mg total) by mouth daily. (Patient not taking: Reported on 01/29/2018) 30 tablet 0 Not Taking at Unknown time  . ondansetron (ZOFRAN) 4 MG tablet Take 1 tablet (4 mg total) by mouth every 8 (eight) hours as needed for nausea or vomiting. 8  tablet 0 prn at prn  . ondansetron (ZOFRAN) 4 MG tablet Take 1 tablet (4 mg total) by mouth every 6 (six) hours as needed for nausea. 20 tablet 0   . pantoprazole (PROTONIX) 40 MG tablet Take 40 mg by mouth every morning.   02/13/2018 at Unknown time  . potassium phosphate, monobasic, (K-PHOS) 500 MG tablet Take 1 tablet (500 mg total) by mouth 4 (four) times daily -  with meals and at bedtime. 3 tablet 0   . pravastatin (PRAVACHOL) 10 MG tablet Take 10 mg by mouth at bedtime.   02/13/2018 at Unknown time  . sucralfate (CARAFATE) 1 g tablet Take 1 g by mouth 2 (two) times daily.      Scheduled: . enoxaparin (LOVENOX) injection  40 mg Subcutaneous Q24H  . insulin aspart  0-5 Units Subcutaneous QHS  . insulin aspart  0-9 Units Subcutaneous TID WC    ROS: History obtained from the patient   General ROS: negative for - chills, fatigue, fever, night sweats, weight gain or weight loss Psychological ROS: negative for - behavioral disorder, hallucinations, memory difficulties, mood swings or suicidal  ideation Ophthalmic ROS: negative for - blurry vision, double vision, eye pain or loss of vision ENT ROS: negative for - epistaxis, nasal discharge, oral lesions, sore throat, tinnitus or vertigo Allergy and Immunology ROS: negative for - hives or itchy/watery eyes Hematological and Lymphatic ROS: negative for - bleeding problems, bruising or swollen lymph nodes Endocrine ROS: negative for - galactorrhea, hair pattern changes, polydipsia/polyuria or temperature intolerance Respiratory ROS: negative for - cough, hemoptysis, shortness of breath or wheezing Cardiovascular ROS: negative for - chest pain, dyspnea on exertion, edema or irregular heartbeat Gastrointestinal ROS: positive  for - abdominal pain, diarrhea, hematemesis, nausea/vomiting Genito-Urinary ROS: negative for - dysuria, hematuria, incontinence or urinary frequency/urgency Musculoskeletal ROS: negative for - joint swelling or muscular weakness Neurological ROS: as noted in HPI Dermatological ROS: negative for rash and skin lesion changes  Physical Examination: Blood pressure 96/62, pulse 87, temperature 98 F (36.7 C), temperature source Oral, resp. rate 18, height 5' (1.524 m), weight 75 kg, SpO2 95 %.   HEENT-  Normocephalic, no lesions, without obvious abnormality.  Normal external eye and conjunctiva.  Normal TM's bilaterally.  Normal auditory canals and external ears. Normal external nose, mucus membranes and septum.  Normal pharynx. Cardiovascular- S1, S2 normal, pulses palpable throughout   Lungs- chest clear, no wheezing, rales, normal symmetric air entry Abdomen- soft, non-tender; bowel sounds normal; no masses,  no organomegaly Extremities- no edema Lymph-no adenopathy palpable Musculoskeletal-no joint tenderness, deformity or swelling Skin-warm and dry, no hyperpigmentation, vitiligo, or suspicious lesions  Neurological Exam   Mental Status: Alert, oriented, thought content appropriate.  Speech fluent without  evidence of aphasia.  Able to follow 3 step commands without difficulty. Attention span and concentration seemed appropriate  Cranial Nerves: II: Discs flat bilaterally; Visual fields grossly normal, pupils equal, round, reactive to light and accommodation III,IV, VI: ptosis not present, extra-ocular motions intact bilaterally V,VII: smile symmetric, facial light touch sensation intact VIII: hearing normal bilaterally IX,X: gag reflex present XI: bilateral shoulder shrug XII: midline tongue extension Motor: Right :  Upper extremity   5/5 Without pronator drift      Left: Upper extremity   5/5 without pronator drift Right:   Lower extremity   5/5  Left: Lower extremity   5/5 Tone and bulk:normal tone throughout; no atrophy noted Sensory: Pinprick and light touch intact bilaterally Deep Tendon Reflexes: 2+ and symmetric throughout Plantars: Right: mute                              Left: mute Cerebellar: Finger-to-nose testing intact bilaterally. Heel to shin testing normal bilaterally Gait: not tested due to safety concerns  Data Reviewed  Laboratory Studies:  Basic Metabolic Panel: Recent Labs  Lab 02/14/18 0152 02/14/18 1934 02/15/18 0707  NA 137 136 137  K 3.2* 2.7* 3.3*  CL 95* 99 102  CO2 27 26 25   GLUCOSE 186* 169* 145*  BUN 18 12 12   CREATININE 0.53 0.63 0.62  CALCIUM 7.8* 7.4* 7.1*  MG  --  1.1* 1.1*    Liver Function Tests: Recent Labs  Lab 02/14/18 0152 02/14/18 1934 02/15/18 0408  AST 650* 274* 182*  ALT 262* 184* 143*  ALKPHOS 112 98 88  BILITOT 4.9* 5.9* 4.3*  PROT 7.3 7.0 6.6  ALBUMIN 4.0 3.7 3.6   Recent Labs  Lab 02/14/18 0152  LIPASE 39   No results for input(s): AMMONIA in the last 168 hours.  CBC: Recent Labs  Lab 02/14/18 0152 02/14/18 1934  WBC 10.0 11.1*  HGB 14.3 13.4  HCT 43.0 40.9  MCV 78.3* 80.7  PLT 388 353    Cardiac Enzymes: Recent Labs  Lab 02/14/18 1934  TROPONINI <0.03     BNP: Invalid input(s): POCBNP  CBG: Recent Labs  Lab 02/14/18 1932 02/15/18 0746  GLUCAP 161* 159*    Microbiology: Results for orders placed or performed during the hospital encounter of 01/29/18  Urine Culture     Status: None   Collection Time: 01/29/18 11:33 PM  Result Value Ref Range Status   Specimen Description   Final    URINE, RANDOM Performed at [facility], [address], [place], [place] [address]    Special Requests   Final    NONE Performed at [facility], [address], [place], [place] [address]    Culture   Final    NO GROWTH Performed at [facility], [place] [address] [place], [place] [address]    Report Status 01/31/2018 FINAL  Final  Blood Culture (routine x 2)     Status: None   Collection Time: 01/30/18 12:20 AM  Result Value Ref Range Status   Specimen Description BLOOD RIGHT ANTECUBITAL  Final   Special Requests   Final    BOTTLES DRAWN AEROBIC AND ANAEROBIC Blood Culture adequate volume   Culture   Final    NO GROWTH 5 DAYS Performed at [facility], [address], [place], [place] [address]    Report Status 02/04/2018 FINAL  Final  Blood Culture (routine x 2)     Status: None   Collection Time: 01/30/18 12:20 AM  Result Value Ref Range Status   Specimen Description BLOOD LEFT ANTECUBITAL  Final   Special Requests   Final    BOTTLES DRAWN AEROBIC AND ANAEROBIC Blood Culture adequate volume   Culture   Final    NO GROWTH 5 DAYS Performed at [facility], [address], [place], [place] [address]    Report Status 02/04/2018 FINAL  Final  MRSA PCR Screening     Status: None   Collection Time: 01/30/18  3:13 AM  Result Value Ref Range Status  MRSA by PCR NEGATIVE NEGATIVE Final    Comment:        The GeneXpert MRSA Assay (FDA approved for NASAL specimens only), is one component of a comprehensive MRSA colonization surveillance program. It is not intended to diagnose  MRSA infection nor to guide or monitor treatment for MRSA infections. Performed at [facility], [address] [place], [place] [address]     Coagulation Studies: No results for input(s): LABPROT, INR in the last 72 hours.  Urinalysis:  Recent Labs  Lab 02/14/18 0152  COLORURINE YELLOW*  LABSPEC 1.020  PHURINE 5.0  GLUCOSEU NEGATIVE  HGBUR NEGATIVE  BILIRUBINUR NEGATIVE  KETONESUR NEGATIVE  PROTEINUR NEGATIVE  NITRITE NEGATIVE  LEUKOCYTESUR NEGATIVE    Lipid Panel:    Component Value Date/Time   CHOL 94 02/15/2018 0408   TRIG 187 (H) 02/15/2018 0408   HDL 41 02/15/2018 0408   CHOLHDL 2.3 02/15/2018 0408   VLDL 37 02/15/2018 0408   LDLCALC 16 02/15/2018 0408    HgbA1C:  Lab Results  Component Value Date   HGBA1C 6.5 (H) 02/15/2018    Urine Drug Screen:      Component Value Date/Time   LABOPIA NONE DETECTED 01/29/2018 2333   COCAINSCRNUR NONE DETECTED 01/29/2018 2333   LABBENZ NONE DETECTED 01/29/2018 2333   AMPHETMU NONE DETECTED 01/29/2018 2333   THCU NONE DETECTED 01/29/2018 2333   LABBARB NONE DETECTED 01/29/2018 2333    Alcohol Level: No results for input(s): ETH in the last 168 hours.  Other results: EKG: normal EKG, normal sinus rhythm, unchanged from previous tracings. Vent. rate 91 BPM PR interval 116 ms QRS duration 66 ms QT/QTc 378/464 ms P-R-T axes 38 -23 -80  Imaging: Ct Head Wo Contrast  Result Date: 02/14/2018 CLINICAL DATA:  Lower extremity weakness, numbness. EXAM: CT HEAD WITHOUT CONTRAST TECHNIQUE: Contiguous axial images were obtained from the base of the skull through the vertex without intravenous contrast. COMPARISON:  07/03/2007 FINDINGS: Brain: No acute intracranial abnormality. Specifically, no hemorrhage, hydrocephalus, mass lesion, acute infarction, or significant intracranial injury. Vascular: No hyperdense vessel or unexpected calcification. Skull: No acute calvarial abnormality. Sinuses/Orbits: Visualized  paranasal sinuses and mastoids clear. Orbital soft tissues unremarkable. Other: None IMPRESSION: No acute intracranial abnormality. Electronically Signed   By: Rolm Baptise M.D.   On: 02/14/2018 21:48   Mr Brain Wo Contrast  Result Date: 02/15/2018 CLINICAL DATA:  Right leg numbness and weakness. EXAM: MRI HEAD WITHOUT CONTRAST MRA HEAD WITHOUT CONTRAST TECHNIQUE: Multiplanar, multiecho pulse sequences of the brain and surrounding structures were obtained without intravenous contrast. Angiographic images of the head were obtained using MRA technique without contrast. COMPARISON:  Head CT 02/14/2018 FINDINGS: MRI HEAD FINDINGS Brain: There is no evidence of acute infarct, intracranial hemorrhage, mass, midline shift, or extra-axial fluid collection. There is very mild cerebral atrophy. Cerebral white matter T2 hyperintensities are most notable in the periventricular regions and are nonspecific but compatible with mild chronic small vessel ischemic disease. Vascular: Major intracranial vascular flow voids are preserved. Skull and upper cervical spine: Unremarkable bone marrow signal. Sinuses/Orbits: Unremarkable orbits. Paranasal sinuses and mastoid air cells are clear. Other: None. MRA HEAD FINDINGS There is mild motion artifact, and there is also increased image noise posteriorly. The visualized distal vertebral arteries are widely patent to the basilar and codominant. Patent left PICA and bilateral SCA origins are identified. Right PICA and AICAs are not well seen. The basilar artery is widely patent. PCAs are patent with artifact through the distal branch  vessels and no evidence of significant proximal stenosis. The internal carotid arteries are patent from skull base to carotid termini without evidence of significant stenosis. ACAs and MCAs are patent without evidence of proximal branch occlusion or significant stenosis. No aneurysm is identified. IMPRESSION: 1. No acute intracranial abnormality. 2. Mild  chronic small vessel ischemic disease. 3. Negative head MRA. Electronically Signed   By: Logan Bores M.D.   On: 02/15/2018 07:01   Ct Abdomen Pelvis W Contrast  Result Date: 02/14/2018 CLINICAL DATA:  X 57 year old female with history of nausea, vomiting and diarrhea with right lower quadrant abdominal pain. EXAM: CT ABDOMEN AND PELVIS WITH CONTRAST TECHNIQUE: Multidetector CT imaging of the abdomen and pelvis was performed using the standard protocol following bolus administration of intravenous contrast. CONTRAST:  172m OMNIPAQUE IOHEXOL 300 MG/ML SOLN, 376mISOVUE-300 IOPAMIDOL (ISOVUE-300) INJECTION 61% COMPARISON:  CT the abdomen and pelvis 01/29/2018. FINDINGS: Lower chest: Mild scarring in the inferior segment of the lingula. Hepatobiliary: No suspicious cystic or solid hepatic lesions. No intra or extrahepatic biliary ductal dilatation. Gallbladder is normal in appearance. Pancreas: No pancreatic mass. No pancreatic ductal dilatation. No pancreatic or peripancreatic fluid or inflammatory changes. Spleen: Unremarkable. Adrenals/Urinary Tract: Bilateral kidneys and adrenal glands are normal in appearance. No hydroureteronephrosis. Urinary bladder is normal in appearance. Stomach/Bowel: Normal appearance of the stomach. No pathologic dilatation of small bowel or colon. A few scattered colonic diverticulae are noted, without surrounding inflammatory changes to suggest an acute diverticulitis at this time. Normal appendix. Vascular/Lymphatic: Aortic atherosclerosis, without evidence of aneurysm or dissection in the abdominal or pelvic vasculature. No lymphadenopathy noted in the abdomen or pelvis. Reproductive: Uterus is mildly enlarged and heterogeneous in appearance containing multiple internal lesions, several of which are partially calcified, presumably fibroid uterus. The largest uterine lesion is in the fundus measuring up to 4.6 cm in diameter. Ovaries are atrophic. Bilateral tubal ligation clips are  incidentally noted. Other: No significant volume of ascites.  No pneumoperitoneum. Musculoskeletal: There are no aggressive appearing lytic or blastic lesions noted in the visualized portions of the skeleton. IMPRESSION: 1. No acute findings are noted in the abdomen or pelvis to account for the patient's symptoms. 2. Normal appendix. 3. Mild colonic diverticulosis without evidence of acute diverticulitis at this time. 4. Fibroid uterus. 5. Aortic atherosclerosis. Electronically Signed   By: DaVinnie Langton.D.   On: 02/14/2018 06:47   Mr MrJodene Namead/brain WoQPm  Result Date: 02/15/2018 CLINICAL DATA:  Right leg numbness and weakness. EXAM: MRI HEAD WITHOUT CONTRAST MRA HEAD WITHOUT CONTRAST TECHNIQUE: Multiplanar, multiecho pulse sequences of the brain and surrounding structures were obtained without intravenous contrast. Angiographic images of the head were obtained using MRA technique without contrast. COMPARISON:  Head CT 02/14/2018 FINDINGS: MRI HEAD FINDINGS Brain: There is no evidence of acute infarct, intracranial hemorrhage, mass, midline shift, or extra-axial fluid collection. There is very mild cerebral atrophy. Cerebral white matter T2 hyperintensities are most notable in the periventricular regions and are nonspecific but compatible with mild chronic small vessel ischemic disease. Vascular: Major intracranial vascular flow voids are preserved. Skull and upper cervical spine: Unremarkable bone marrow signal. Sinuses/Orbits: Unremarkable orbits. Paranasal sinuses and mastoid air cells are clear. Other: None. MRA HEAD FINDINGS There is mild motion artifact, and there is also increased image noise posteriorly. The visualized distal vertebral arteries are widely patent to the basilar and codominant. Patent left PICA and bilateral SCA origins are identified. Right PICA and AICAs are not well seen. The basilar  artery is widely patent. PCAs are patent with artifact through the distal branch vessels and no  evidence of significant proximal stenosis. The internal carotid arteries are patent from skull base to carotid termini without evidence of significant stenosis. ACAs and MCAs are patent without evidence of proximal branch occlusion or significant stenosis. No aneurysm is identified. IMPRESSION: 1. No acute intracranial abnormality. 2. Mild chronic small vessel ischemic disease. 3. Negative head MRA. Electronically Signed   By: Logan Bores M.D.   On: 02/15/2018 07:01    Assessment: 58 y.o. female presenting to the ED with complaints of right lower extremity weakness and numbness. Symptoms now resolved. Patient reports some discomfort in the calf area with ambulation.  Patient initially presented with GI symptoms and found to have low K+, low mag and elevated LFTs likely due to alcohol abuse. Have low suspicion that her symptoms could have been due to ischemic event in the setting of metabolic derangement and negative stroke work up. MRI brain and MRA reviewed and shows no acute intracranial abnormality or LVO. HgbA1c 6.5, LDL 16.   Stroke Risk Factors - family history, hyperlipidemia and hypertension  Plan: 1. Agree with current medical management of underlying conditions. No further stroke work up indicated from neurology standpoint.  2. Alcohol cessation counseling.  This patient was staffed with Dr. Irish Elders, Alease Frame who personally evaluated patient, reviewed documentation and agreed with assessment and plan of care as above.  Rufina Falco, DNP, FNP-BC Board certified Nurse Practitioner Neurology Department     02/15/2018, 11:09 AM

## 2018-02-15 NOTE — Plan of Care (Signed)
  Problem: Education: Goal: Knowledge of disease or condition will improve Outcome: Progressing Goal: Knowledge of secondary prevention will improve Outcome: Progressing Goal: Knowledge of patient specific risk factors addressed and post discharge goals established will improve Outcome: Progressing Goal: Individualized Educational Video(s) Outcome: Progressing   Problem: Coping: Goal: Will verbalize positive feelings about self Outcome: Progressing Goal: Will identify appropriate support needs Outcome: Progressing   Problem: Health Behavior/Discharge Planning: Goal: Ability to manage health-related needs will improve Outcome: Progressing   Problem: Self-Care: Goal: Ability to participate in self-care as condition permits will improve Outcome: Progressing Goal: Verbalization of feelings and concerns over difficulty with self-care will improve Outcome: Progressing Goal: Ability to communicate needs accurately will improve Outcome: Progressing   Problem: Nutrition: Goal: Risk of aspiration will decrease Outcome: Not Applicable   Problem: Ischemic Stroke/TIA Tissue Perfusion: Goal: Complications of ischemic stroke/TIA will be minimized Outcome: Progressing   Problem: Education: Goal: Knowledge of General Education information will improve Description Including pain rating scale, medication(s)/side effects and non-pharmacologic comfort measures Outcome: Progressing   Problem: Health Behavior/Discharge Planning: Goal: Ability to manage health-related needs will improve Outcome: Progressing   Problem: Clinical Measurements: Goal: Ability to maintain clinical measurements within normal limits will improve Outcome: Progressing Goal: Will remain free from infection Outcome: Progressing Goal: Diagnostic test results will improve Outcome: Progressing Goal: Respiratory complications will improve Outcome: Progressing Goal: Cardiovascular complication will be avoided Outcome:  Progressing   Problem: Activity: Goal: Risk for activity intolerance will decrease Outcome: Not Applicable   Problem: Nutrition: Goal: Adequate nutrition will be maintained Outcome: Progressing   Problem: Coping: Goal: Level of anxiety will decrease Outcome: Progressing   Problem: Elimination: Goal: Will not experience complications related to bowel motility Outcome: Progressing Goal: Will not experience complications related to urinary retention Outcome: Progressing   Problem: Pain Managment: Goal: General experience of comfort will improve Outcome: Progressing   Problem: Safety: Goal: Ability to remain free from injury will improve Outcome: Progressing

## 2018-02-15 NOTE — Progress Notes (Signed)
PT Cancellation Note  Patient Details Name: Sheila [name] MRN: [id number] DOB: 1960-01-25   Cancelled Treatment:    Reason Eval/Treat Not Completed: PT screened, no needs identified, will sign off.  PT consult received.  Chart reviewed.  Pt reports she has been ambulating to bathroom on her own during hospitalization.  Lives with 57 y.o. daughter in 1 level home with ramp to enter; no AD use prior to admission; no recent falls.  Pt noted to be modified independent with bed mobility; independent with transfers; and independent with ambulation 150 feet (no AD) in room.  No loss of balance noted during session's activities.  Pt reporting that the numbness/weakness in R LE, B hand numbness, and N/V and R LQ pain were all resolved.  Pt did note 6/10 lateral R ankle pain initially to touch (no calf pain throughout PT session though) but pain in R ankle resolved after ambulation.  Intact B LE proprioception, sensation, tone, strength, and coordination (although R heel to shin limited ROM d/t baseline impairments per pt report).  Pt appears to be at baseline level of functional mobility (pt reports no changes to mobility and no PT needs).  No acute PT needs identified.  Will sign off.  Leitha Bleak, PT 02/15/18, 2:48 PM [phone]  Pt seen for PT screen [id number].

## 2018-02-15 NOTE — Progress Notes (Signed)
PT Cancellation Note  Patient Details Name: Sheila [name] MRN: [id number] DOB: 1960/10/31   Cancelled Treatment:    Reason Eval/Treat Not Completed: Other (comment).  PT consult received.  Chart reviewed.  Pt pending results of LE venous US.  Will hold therapy at this time until results of testing is known and pt is appropriate for PT evaluation.  Leitha Bleak, PT 02/15/18, 12:09 PM [phone]

## 2018-02-16 LAB — COMPREHENSIVE METABOLIC PANEL
ALT: 91 U/L — ABNORMAL HIGH (ref 0–44)
AST: 98 U/L — ABNORMAL HIGH (ref 15–41)
Albumin: 3.1 g/dL — ABNORMAL LOW (ref 3.5–5.0)
Alkaline Phosphatase: 84 U/L (ref 38–126)
Anion gap: 6 (ref 5–15)
BUN: 9 mg/dL (ref 6–20)
CO2: 27 mmol/L (ref 22–32)
Calcium: 7.3 mg/dL — ABNORMAL LOW (ref 8.9–10.3)
Chloride: 105 mmol/L (ref 98–111)
Creatinine, Ser: 0.55 mg/dL (ref 0.44–1.00)
GFR calc Af Amer: >60 mL/min (ref >60)
GFR calc non Af Amer: >60 mL/min (ref >60)
GLUCOSE: 162 mg/dL — AB (ref 70–99)
Potassium: 2.9 mmol/L — ABNORMAL LOW (ref 3.5–5.1)
Sodium: 138 mmol/L (ref 135–145)
TOTAL PROTEIN: 6.1 g/dL — AB (ref 6.5–8.1)
Total Bilirubin: 2.4 mg/dL — ABNORMAL HIGH (ref 0.3–1.2)

## 2018-02-16 LAB — MAGNESIUM: Magnesium: 1.9 mg/dL (ref 1.7–2.4)

## 2018-02-16 LAB — CBC
HCT: 34.4 % — ABNORMAL LOW (ref 36.0–46.0)
Hemoglobin: 10.8 g/dL — ABNORMAL LOW (ref 12.0–15.0)
MCH: 26.1 pg (ref 26.0–34.0)
MCHC: 31.4 g/dL (ref 30.0–36.0)
MCV: 83.1 fL (ref 80.0–100.0)
Platelets: 221 10*3/uL (ref 150–400)
RBC: 4.14 MIL/uL (ref 3.87–5.11)
RDW: 15.5 % (ref 11.5–15.5)
WBC: 6.9 10*3/uL (ref 4.0–10.5)
nRBC: 0 % (ref 0.0–0.2)

## 2018-02-16 LAB — POTASSIUM: Potassium: 3 mmol/L — ABNORMAL LOW (ref 3.5–5.1)

## 2018-02-16 LAB — GLUCOSE, CAPILLARY
Glucose-Capillary: 156 mg/dL — ABNORMAL HIGH (ref 70–99)
Glucose-Capillary: 148 mg/dL — ABNORMAL HIGH (ref 70–99)

## 2018-02-16 MED ORDER — FOLIC ACID 1 MG PO TABS: 1.0000 mg | ORAL_TABLET | Freq: Every day | ORAL | Status: AC

## 2018-02-16 MED ORDER — ASPIRIN EC 325 MG PO TBEC: 325.0000 mg | DELAYED_RELEASE_TABLET | Freq: Every day | ORAL | Status: DC

## 2018-02-16 MED ORDER — POTASSIUM CHLORIDE CRYS ER 20 MEQ PO TBCR
40.0000 meq | EXTENDED_RELEASE_TABLET | Freq: Two times a day (BID) | ORAL | Status: DC
  Administered 2018-02-16: 40 meq via ORAL
  Filled 2018-02-16: qty 2

## 2018-02-16 MED ORDER — KLOR-CON M20 20 MEQ PO TBCR: 40.0000 meq | EXTENDED_RELEASE_TABLET | Freq: Every day | ORAL | 0 refills | Status: AC

## 2018-02-16 MED ORDER — POTASSIUM CHLORIDE 10 MEQ/100ML IV SOLN
10.0000 meq | INTRAVENOUS | Status: AC
  Administered 2018-02-16 (×4): 10 meq via INTRAVENOUS
  Filled 2018-02-16 (×2): qty 100

## 2018-02-16 MED ORDER — ADULT MULTIVITAMIN W/MINERALS CH: 1.0000 | ORAL_TABLET | Freq: Every day | ORAL | Status: DC

## 2018-02-16 MED ORDER — THIAMINE HCL 100 MG PO TABS: 100.0000 mg | ORAL_TABLET | Freq: Every day | ORAL | Status: AC

## 2018-02-16 MED ORDER — GABAPENTIN 300 MG PO CAPS: 300.0000 mg | ORAL_CAPSULE | Freq: Three times a day (TID) | ORAL | 0 refills | Status: DC

## 2018-02-16 NOTE — Discharge Summary (Addendum)
[facility] [place] NAME: Sheila [name]    MR#:  [id number]  DATE OF BIRTH:  1960-02-19  DATE OF ADMISSION:  02/14/2018 ADMITTING PHYSICIAN: Lance Coon, MD  DATE OF DISCHARGE:  02/16/18   PRIMARY CARE PHYSICIAN: Ellamae Sia, MD    ADMISSION DIAGNOSIS:  Hypokalemia [E87.6] Hyperbilirubinemia [E80.6] Weakness of right lower extremity [R29.898]  DISCHARGE DIAGNOSIS:   Alcohol hepatitis Hypokalemia Right leg cramps secondary to hypokalemia Stroke ruled out Alcohol abuse SECONDARY DIAGNOSIS:   Past Medical History:  Diagnosis Date  . Anemia   . Arthritis   . Asthma   . COPD (chronic obstructive pulmonary disease) ([place])   . DDD (degenerative disc disease), lumbar   . Diabetes mellitus without complication ([place])    on metformin  . DJD (degenerative joint disease)   . Dysrhythmia   . Fatty liver   . Fibroids   . GERD (gastroesophageal reflux disease)   . Glaucoma   . Hyperlipidemia   . Hypertension   . IBS (irritable bowel syndrome)   . Lumbago   . Neuromuscular disorder ([place])   . 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[place]) [id number]/2119  . Renal disorder   . Ulcerative colitis ([place])    on mesalamine    HOSPITAL COURSE:  HPI  Sheila [name]  is a 58 y.o. female who presents with chief complaint as above.  Patient presents the ED tonight with a complaint of right lower extremity numbness and weakness, as well as bilateral hand numbness.  She was just in the ED last night complaining of diarrhea with nausea and vomiting.  She was found to have elevated transaminases at that time.  Work-up did not elucidate any direct clear cause.  She was sent home, returns today stating that her nausea and vomiting has resolved, but she developed this numbness earlier in the evening.  On lab evaluation her transaminases have improved, but her bilirubin has risen even higher.  Hospitalist were called for admission and further evaluation  Hospital  course  Right leg weakness/cramps- from hypokalemia or muscle crampCT head and MRI brain are negative Venous Dopplers of right lower extremity-no DVT Replete potassium and check magnesium level Neurology is following -no need of stroke work-up as the patient is ruled out for stroke.  Discontinue neuro checks and NIH scale  Hypokalemia -replace potassium and monitor Magnesium at 1.9 and potassium at 3.0, patient is receiving IV and p.o. potassium supplements patient also reports that she has a ride at 1 PM and could not stay until IV potassium is completed We will give her potassium chloride 40 mEq p.o. prescription x1 to take at home PCP to check her potassium during the follow-up visit   Acute gastroenteritis for the past 3 days- Resolved   GI panel and C. difficile toxin ordered but sample not collected as patient does not have diarrhea anymore GI consult placed seen by Dr. Alice Reichert  Hepatitis panel negative  Alcoholic hepatitis Clinically improving Monitor LFTs Discharge patient home and outpatient follow-up with gastroenterology outpatient follow-up with alcohol Anonymous  Diabetes ([place]) -sliding scale insulin coverage  HTN (hypertension) -blood pressure improved with IV fluids resume home medications  HLD (hyperlipidemia) -home dose antilipid  GERD (gastroesophageal reflux disease) -home dose PPI  History of alcohol abuse CIWA protocol and outpatient follow-up with alcohol Anonymous DISCHARGE CONDITIONS:   Stable   CONSULTS OBTAINED:  Treatment Team:  Leotis Pain, MD Efrain Sella, MD   PROCEDURES none  DRUG ALLERGIES:  Allergies  Allergen Reactions  . Ampicillin Anaphylaxis  . Erythromycin Anaphylaxis  . Omeprazole Anaphylaxis  . Penicillins Anaphylaxis and Other (See Comments)    Has patient had a PCN reaction causing immediate rash, facial/tongue/throat swelling, SOB or lightheadedness with hypotension: Yes Has patient had a PCN  reaction causing severe rash involving mucus membranes or skin necrosis: No Has patient had a PCN reaction that required hospitalization: Unknown Has patient had a PCN reaction occurring within the last 10 years: Yes If all of the above answers are "NO", then may proceed with Cephalosporin use.   . Triamterene-Hctz Shortness Of Breath and Swelling  . Quinapril Cough    DISCHARGE MEDICATIONS:   Allergies as of 02/16/2018      Reactions   Ampicillin Anaphylaxis   Erythromycin Anaphylaxis   Omeprazole Anaphylaxis   Penicillins Anaphylaxis, Other (See Comments)   Has patient had a PCN reaction causing immediate rash, facial/tongue/throat swelling, SOB or lightheadedness with hypotension: Yes Has patient had a PCN reaction causing severe rash involving mucus membranes or skin necrosis: No Has patient had a PCN reaction that required hospitalization: Unknown Has patient had a PCN reaction occurring within the last 10 years: Yes If all of the above answers are "NO", then may proceed with Cephalosporin use.   Triamterene-hctz Shortness Of Breath, Swelling   Quinapril Cough      Medication List    STOP taking these medications   acetaminophen 500 MG tablet Commonly known as:  TYLENOL   potassium phosphate (monobasic) 500 MG tablet Commonly known as:  K-PHOS     TAKE these medications   albuterol 108 (90 Base) MCG/ACT inhaler Commonly known as:  PROVENTIL HFA;VENTOLIN HFA Inhale 2 puffs into the lungs every 4 (four) hours as needed for wheezing or shortness of breath.   allopurinol 100 MG tablet Commonly known as:  ZYLOPRIM Take 100 mg by mouth daily.   bimatoprost 0.03 % ophthalmic solution Commonly known as:  LUMIGAN Place 1 drop into both eyes at bedtime.   brinzolamide 1 % ophthalmic suspension Commonly known as:  AZOPT Place 1 drop into both eyes 2 (two) times daily.   chlorthalidone 25 MG tablet Commonly known as:  HYGROTON Take 25 mg by mouth daily.   [employer] VITAMIN  B12 1000 MCG tablet Generic drug:  cyanocobalamin Take 1,000 mcg by mouth daily.   dorzolamide 2 % ophthalmic solution Commonly known as:  TRUSOPT Place 1 drop into both eyes 2 (two) times daily.   folic acid 1 MG tablet Commonly known as:  FOLVITE Take 1 tablet (1 mg total) by mouth daily. Start taking on:  February 17, 2018   gabapentin 300 MG capsule Commonly known as:  NEURONTIN Take 1 capsule (300 mg total) by mouth 3 (three) times daily.   KLOR-CON M20 20 MEQ tablet Generic drug:  potassium chloride SA Take 2 tablets (40 mEq total) by mouth daily.   latanoprost 0.005 % ophthalmic solution Commonly known as:  XALATAN Place 1 drop into both eyes at bedtime.   linagliptin 5 MG Tabs tablet Commonly known as:  TRADJENTA Take 1 tablet (5 mg total) by mouth daily.   losartan 25 MG tablet Commonly known as:  COZAAR Take 25 mg by mouth daily.   Magnesium Oxide 200 MG Tabs Commonly known as:  MAG-OXIDE Take 2 tablets (400 mg total) by mouth 2 (two) times daily.   mesalamine 1.2 g EC tablet Commonly known as:  LIALDA Take 2.4 g by mouth daily with breakfast.  metFORMIN 500 MG 24 hr tablet Commonly known as:  GLUCOPHAGE-XR Take 500 mg by mouth 2 (two) times daily.   metoprolol succinate 50 MG 24 hr tablet Commonly known as:  TOPROL-XL Take 50 mg by mouth at bedtime. What changed:  Another medication with the same name was removed. Continue taking this medication, and follow the directions you see here.   multivitamin with minerals Tabs tablet Take 1 tablet by mouth daily. Start taking on:  February 17, 2018   ondansetron 4 MG tablet Commonly known as:  ZOFRAN Take 1 tablet (4 mg total) by mouth every 6 (six) hours as needed for nausea.   pantoprazole 40 MG tablet Commonly known as:  PROTONIX Take 40 mg by mouth every morning.   pravastatin 10 MG tablet Commonly known as:  PRAVACHOL Take 10 mg by mouth at bedtime.   sucralfate 1 g tablet Commonly known as:   CARAFATE Take 1 g by mouth 2 (two) times daily.   thiamine 100 MG tablet Take 1 tablet (100 mg total) by mouth daily. Start taking on:  February 17, 2018   triamcinolone 0.025 % cream Commonly known as:  KENALOG Apply 1 application topically 2 (two) times daily.        DISCHARGE INSTRUCTIONS:   Follow-up with a primary care physician in 3 days PCP to check potassium levels during the follow-up visit Outpatient follow-up with gastroenterology in 2 weeks-Dr. Alice Reichert  DIET:  Cardiac diet and Diabetic diet  DISCHARGE CONDITION:  Fair  ACTIVITY:  Activity as tolerated  OXYGEN:  Home Oxygen: No.   Oxygen Delivery: room air  DISCHARGE LOCATION:  home   If you experience worsening of your admission symptoms, develop shortness of breath, life threatening emergency, suicidal or homicidal thoughts you must seek medical attention immediately by calling 911 or calling your MD immediately  if symptoms less severe.  You Must read complete instructions/literature along with all the possible adverse reactions/side effects for all the Medicines you take and that have been prescribed to you. Take any new Medicines after you have completely understood and accpet all the possible adverse reactions/side effects.   Please note  You were cared for by a hospitalist during your hospital stay. If you have any questions about your discharge medications or the care you received while you were in the hospital after you are discharged, you can call the unit and asked to speak with the hospitalist on call if the hospitalist that took care of you is not available. Once you are discharged, your primary care physician will handle any further medical issues. Please note that NO REFILLS for any discharge medications will be authorized once you are discharged, as it is imperative that you return to your primary care physician (or establish a relationship with a primary care physician if you do not have one) for  your aftercare needs so that they can reassess your need for medications and monitor your lab values.     Today  Chief Complaint  Patient presents with  . Numbness   Patient is doing fine right leg cramping weakness significantly improved.  No nausea vomiting diarrhea patient could not provide any specimen of stool to check C. difficile toxin on GI panel.  Potassium is low at 3.0 , giving p.o. and IV supplements but patient has a ride at 1 PM and could not stay after that.  Agreeable to take potassium supplementS at home, prescribed 2 tablets of 40 mEq each  ROS:  CONSTITUTIONAL: Denies  fevers, chills. Denies any fatigue, weakness.  EYES: Denies blurry vision, double vision, eye pain. EARS, NOSE, THROAT: Denies tinnitus, ear pain, hearing loss. RESPIRATORY: Denies cough, wheeze, shortness of breath.  CARDIOVASCULAR: Denies chest pain, palpitations, edema.  GASTROINTESTINAL: Denies nausea, vomiting, diarrhea, abdominal pain. Denies bright red blood per rectum. GENITOURINARY: Denies dysuria, hematuria. ENDOCRINE: Denies nocturia or thyroid problems. HEMATOLOGIC AND LYMPHATIC: Denies easy bruising or bleeding. SKIN: Denies rash or lesion. MUSCULOSKELETAL: Denies pain in neck, back, shoulder, knees, hips or arthritic symptoms.  NEUROLOGIC: Denies paralysis, paresthesias.  PSYCHIATRIC: Denies anxiety or depressive symptoms.   VITAL SIGNS:  Blood pressure 121/66, pulse 88, temperature 98.1 F (36.7 C), temperature source Oral, resp. rate 17, height 5' (1.524 m), weight 75 kg, SpO2 96 %.  I/O:    Intake/Output Summary (Last 24 hours) at 02/16/2018 1455 Last data filed at 02/16/2018 0350 Gross per 24 hour  Intake 1160.71 ml  Output -  Net 1160.71 ml    PHYSICAL EXAMINATION:  GENERAL:  58 y.o.-year-old patient lying in the bed with no acute distress.  EYES: Pupils equal, round, reactive to light and accommodation. No scleral icterus. Extraocular muscles intact.  HEENT: Head  atraumatic, normocephalic. Oropharynx and nasopharynx clear.  NECK:  Supple, no jugular venous distention. No thyroid enlargement, no tenderness.  LUNGS: Normal breath sounds bilaterally, no wheezing, rales,rhonchi or crepitation. No use of accessory muscles of respiration.  CARDIOVASCULAR: S1, S2 normal. No murmurs, rubs, or gallops.  ABDOMEN: Soft, non-tender, non-distended. Bowel sounds present.   EXTREMITIES: No pedal edema, cyanosis, or clubbing.  NEUROLOGIC: Awake, alert and oriented x3 sensation intact. Gait not checked.  PSYCHIATRIC: The patient is alert and oriented x 3.  SKIN: No obvious rash, lesion, or ulcer.   DATA REVIEW:   CBC Recent Labs  Lab 02/16/18 0559  WBC 6.9  HGB 10.8*  HCT 34.4*  PLT 221    Chemistries  Recent Labs  Lab 02/16/18 0559 02/16/18 1025  NA 138  --   K 2.9* 3.0*  CL 105  --   CO2 27  --   GLUCOSE 162*  --   BUN 9  --   CREATININE 0.55  --   CALCIUM 7.3*  --   MG 1.9  --   AST 98*  --   ALT 91*  --   ALKPHOS 84  --   BILITOT 2.4*  --     Cardiac Enzymes Recent Labs  Lab 02/14/18 1934  TROPONINI <0.03    Microbiology Results  Results for orders placed or performed during the hospital encounter of 01/29/18  Urine Culture     Status: None   Collection Time: 01/29/18 11:33 PM  Result Value Ref Range Status   Specimen Description   Final    URINE, RANDOM Performed at [facility], [address] [place], [place] [address]    Special Requests   Final    NONE Performed at [facility], [address] [place], [place] [address]    Culture   Final    NO GROWTH Performed at [facility], [place] [address], [place], [place] [address]    Report Status 01/31/2018 FINAL  Final  Blood Culture (routine x 2)     Status: None   Collection Time: 01/30/18 12:20 AM  Result Value Ref Range Status   Specimen Description BLOOD RIGHT ANTECUBITAL  Final   Special Requests   Final    BOTTLES DRAWN AEROBIC AND  ANAEROBIC Blood Culture adequate  volume   Culture   Final    NO GROWTH 5 DAYS Performed at [facility], [address] [place], [place] [address]    Report Status 02/04/2018 FINAL  Final  Blood Culture (routine x 2)     Status: None   Collection Time: 01/30/18 12:20 AM  Result Value Ref Range Status   Specimen Description BLOOD LEFT ANTECUBITAL  Final   Special Requests   Final    BOTTLES DRAWN AEROBIC AND ANAEROBIC Blood Culture adequate volume   Culture   Final    NO GROWTH 5 DAYS Performed at [facility], [address] [place], [place] [address]    Report Status 02/04/2018 FINAL  Final  MRSA PCR Screening     Status: None   Collection Time: 01/30/18  3:13 AM  Result Value Ref Range Status   MRSA by PCR NEGATIVE NEGATIVE Final    Comment:        The GeneXpert MRSA Assay (FDA approved for NASAL specimens only), is one component of a comprehensive MRSA colonization surveillance program. It is not intended to diagnose MRSA infection nor to guide or monitor treatment for MRSA infections. Performed at [facility], [address] [place], [place] [address]     RADIOLOGY:  Ct Head Wo Contrast  Result Date: 02/14/2018 CLINICAL DATA:  Lower extremity weakness, numbness. EXAM: CT HEAD WITHOUT CONTRAST TECHNIQUE: Contiguous axial images were obtained from the base of the skull through the vertex without intravenous contrast. COMPARISON:  07/03/2007 FINDINGS: Brain: No acute intracranial abnormality. Specifically, no hemorrhage, hydrocephalus, mass lesion, acute infarction, or significant intracranial injury. Vascular: No hyperdense vessel or unexpected calcification. Skull: No acute calvarial abnormality. Sinuses/Orbits: Visualized paranasal sinuses and mastoids clear. Orbital soft tissues unremarkable. Other: None IMPRESSION: No acute intracranial abnormality. Electronically Signed   By: Rolm Baptise M.D.   On: 02/14/2018 21:48   Mr Brain Wo  Contrast  Result Date: 02/15/2018 CLINICAL DATA:  Right leg numbness and weakness. EXAM: MRI HEAD WITHOUT CONTRAST MRA HEAD WITHOUT CONTRAST TECHNIQUE: Multiplanar, multiecho pulse sequences of the brain and surrounding structures were obtained without intravenous contrast. Angiographic images of the head were obtained using MRA technique without contrast. COMPARISON:  Head CT 02/14/2018 FINDINGS: MRI HEAD FINDINGS Brain: There is no evidence of acute infarct, intracranial hemorrhage, mass, midline shift, or extra-axial fluid collection. There is very mild cerebral atrophy. Cerebral white matter T2 hyperintensities are most notable in the periventricular regions and are nonspecific but compatible with mild chronic small vessel ischemic disease. Vascular: Major intracranial vascular flow voids are preserved. Skull and upper cervical spine: Unremarkable bone marrow signal. Sinuses/Orbits: Unremarkable orbits. Paranasal sinuses and mastoid air cells are clear. Other: None. MRA HEAD FINDINGS There is mild motion artifact, and there is also increased image noise posteriorly. The visualized distal vertebral arteries are widely patent to the basilar and codominant. Patent left PICA and bilateral SCA origins are identified. Right PICA and AICAs are not well seen. The basilar artery is widely patent. PCAs are patent with artifact through the distal branch vessels and no evidence of significant proximal stenosis. The internal carotid arteries are patent from skull base to carotid termini without evidence of significant stenosis. ACAs and MCAs are patent without evidence of proximal branch occlusion or significant stenosis. No aneurysm is identified. IMPRESSION: 1. No acute intracranial abnormality. 2. Mild chronic small vessel ischemic disease. 3. Negative head MRA. Electronically Signed   By: Logan Bores M.D.   On: 02/15/2018 07:01  Ct Abdomen Pelvis W Contrast  Result Date: 02/14/2018 CLINICAL DATA:  X 58 year old  female with history of nausea, vomiting and diarrhea with right lower quadrant abdominal pain. EXAM: CT ABDOMEN AND PELVIS WITH CONTRAST TECHNIQUE: Multidetector CT imaging of the abdomen and pelvis was performed using the standard protocol following bolus administration of intravenous contrast. CONTRAST:  173m OMNIPAQUE IOHEXOL 300 MG/ML SOLN, 357mISOVUE-300 IOPAMIDOL (ISOVUE-300) INJECTION 61% COMPARISON:  CT the abdomen and pelvis 01/29/2018. FINDINGS: Lower chest: Mild scarring in the inferior segment of the lingula. Hepatobiliary: No suspicious cystic or solid hepatic lesions. No intra or extrahepatic biliary ductal dilatation. Gallbladder is normal in appearance. Pancreas: No pancreatic mass. No pancreatic ductal dilatation. No pancreatic or peripancreatic fluid or inflammatory changes. Spleen: Unremarkable. Adrenals/Urinary Tract: Bilateral kidneys and adrenal glands are normal in appearance. No hydroureteronephrosis. Urinary bladder is normal in appearance. Stomach/Bowel: Normal appearance of the stomach. No pathologic dilatation of small bowel or colon. A few scattered colonic diverticulae are noted, without surrounding inflammatory changes to suggest an acute diverticulitis at this time. Normal appendix. Vascular/Lymphatic: Aortic atherosclerosis, without evidence of aneurysm or dissection in the abdominal or pelvic vasculature. No lymphadenopathy noted in the abdomen or pelvis. Reproductive: Uterus is mildly enlarged and heterogeneous in appearance containing multiple internal lesions, several of which are partially calcified, presumably fibroid uterus. The largest uterine lesion is in the fundus measuring up to 4.6 cm in diameter. Ovaries are atrophic. Bilateral tubal ligation clips are incidentally noted. Other: No significant volume of ascites.  No pneumoperitoneum. Musculoskeletal: There are no aggressive appearing lytic or blastic lesions noted in the visualized portions of the skeleton.  IMPRESSION: 1. No acute findings are noted in the abdomen or pelvis to account for the patient's symptoms. 2. Normal appendix. 3. Mild colonic diverticulosis without evidence of acute diverticulitis at this time. 4. Fibroid uterus. 5. Aortic atherosclerosis. Electronically Signed   By: DaVinnie Langton.D.   On: 02/14/2018 06:47   [place]enous Img Lower Unilateral Right  Result Date: 02/15/2018 CLINICAL DATA:  Calf tenderness.  Evaluate for DVT. EXAM: RIGHT LOWER EXTREMITY VENOUS DOPPLER ULTRASOUND TECHNIQUE: Gray-scale sonography with graded compression, as well as color Doppler and duplex ultrasound were performed to evaluate the lower extremity deep venous systems from the level of the common femoral vein and including the common femoral, femoral, profunda femoral, popliteal and calf veins including the posterior tibial, peroneal and gastrocnemius veins when visible. The superficial great saphenous vein was also interrogated. Spectral Doppler was utilized to evaluate flow at rest and with distal augmentation maneuvers in the common femoral, femoral and popliteal veins. COMPARISON:  None. FINDINGS: Contralateral Common Femoral Vein: Respiratory phasicity is normal and symmetric with the symptomatic side. No evidence of thrombus. Normal compressibility. Common Femoral Vein: No evidence of thrombus. Normal compressibility, respiratory phasicity and response to augmentation. Saphenofemoral Junction: No evidence of thrombus. Normal compressibility and flow on color Doppler imaging. Profunda Femoral Vein: No evidence of thrombus. Normal compressibility and flow on color Doppler imaging. Femoral Vein: No evidence of thrombus. Normal compressibility, respiratory phasicity and response to augmentation. Popliteal Vein: No evidence of thrombus. Normal compressibility, respiratory phasicity and response to augmentation. Calf Veins: No evidence of thrombus. Normal compressibility and flow on color Doppler imaging.  Superficial Great Saphenous Vein: No evidence of thrombus. Normal compressibility. Venous Reflux:  None. Other Findings:  None. IMPRESSION: No evidence of DVT within the right lower extremity. Electronically Signed   By: JoSandi Mariscal.D.   On: 02/15/2018 12:19  Mr Jodene Nam Head/brain ZX Cm  Result Date: 02/15/2018 CLINICAL DATA:  Right leg numbness and weakness. EXAM: MRI HEAD WITHOUT CONTRAST MRA HEAD WITHOUT CONTRAST TECHNIQUE: Multiplanar, multiecho pulse sequences of the brain and surrounding structures were obtained without intravenous contrast. Angiographic images of the head were obtained using MRA technique without contrast. COMPARISON:  Head CT 02/14/2018 FINDINGS: MRI HEAD FINDINGS Brain: There is no evidence of acute infarct, intracranial hemorrhage, mass, midline shift, or extra-axial fluid collection. There is very mild cerebral atrophy. Cerebral white matter T2 hyperintensities are most notable in the periventricular regions and are nonspecific but compatible with mild chronic small vessel ischemic disease. Vascular: Major intracranial vascular flow voids are preserved. Skull and upper cervical spine: Unremarkable bone marrow signal. Sinuses/Orbits: Unremarkable orbits. Paranasal sinuses and mastoid air cells are clear. Other: None. MRA HEAD FINDINGS There is mild motion artifact, and there is also increased image noise posteriorly. The visualized distal vertebral arteries are widely patent to the basilar and codominant. Patent left PICA and bilateral SCA origins are identified. Right PICA and AICAs are not well seen. The basilar artery is widely patent. PCAs are patent with artifact through the distal branch vessels and no evidence of significant proximal stenosis. The internal carotid arteries are patent from skull base to carotid termini without evidence of significant stenosis. ACAs and MCAs are patent without evidence of proximal branch occlusion or significant stenosis. No aneurysm is identified.  IMPRESSION: 1. No acute intracranial abnormality. 2. Mild chronic small vessel ischemic disease. 3. Negative head MRA. Electronically Signed   By: Logan Bores M.D.   On: 02/15/2018 07:01    EKG:   Orders placed or performed during the hospital encounter of 02/14/18  . ED EKG  . ED EKG      Management plans discussed with the patient, family and they are in agreement.  CODE STATUS:     Code Status Orders  (From admission, onward)         Start     Ordered   02/15/18 0009  Full code  Continuous     02/15/18 0009        Code Status History    Date Active Date Inactive Code Status Order ID Comments User Context   01/30/2018 0146 01/31/2018 1703 Full Code [id number]  Lance Coon, MD Inpatient   08/29/2017 2314 09/01/2017 1739 Full Code [id number]  Lance Coon, MD Inpatient   01/14/2015 1214 01/17/2015 1844 Full Code [id number]  Hessie Knows, MD Inpatient      TOTAL TIME TAKING CARE OF THIS PATIENT: 42 minutes.   Note: This dictation was prepared with Dragon dictation along with smaller phrase technology. Any transcriptional errors that result from this process are unintentional.   @MEC @  on 02/16/2018 at 2:55 PM  Between 7am to 6pm - Pager - [phone]  After 6pm go to [email or web address] - password [email or web address] [facility]  Office  [phone]  CC: Primary care physician; Ellamae Sia, MD

## 2018-02-16 NOTE — Progress Notes (Signed)
PHARMACY CONSULT NOTE - FOLLOW UP  Pharmacy Consult for Electrolyte Monitoring and Replacement   Recent Labs: Potassium (mmol/L)  Date Value  02/16/2018 2.9 (L)  11/12/2013 3.9   Magnesium (mg/dL)  Date Value  02/16/2018 1.9   Calcium (mg/dL)  Date Value  02/16/2018 7.3 (L)   Calcium, Total (mg/dL)  Date Value  11/12/2013 6.1 (LL)   Albumin (g/dL)  Date Value  02/16/2018 3.1 (L)  10/11/2012 3.9   Phosphorus (mg/dL)  Date Value  01/31/2018 <1.0 (LL)   Sodium (mmol/L)  Date Value  02/16/2018 138  11/12/2013 147 (H)     Assessment: Patient is a 58yo female admitted for LE numbness and weakness. Patient with recent bout of N/V/D and now with hypokalemia and hypomagnesemia.  Plan:  2/12 K 2.9, Mag 1.9 - Will order KCl 60mq IV x 4 and restart home med of KCl 450m PO BID. Recheck K and Mag at 18:00. Follow up on AM labs.   LiPaulina FusiPharmD, BCPS 02/16/2018 9:23 AM

## 2018-02-16 NOTE — Progress Notes (Signed)
Pt for discharge home pt  Waiting to receive completion of  Potassium boluses.  Instructions discussed with pt. presc given and discussed and home meds discussed.  Diet/ activity and f/u discussed. Verbalizes understanding  Of plans. Sl d/cd.   Out via w/c w/o c/o . No further stools noted.

## 2018-02-16 NOTE — Progress Notes (Signed)
GI Inpatient Follow-up Note  Subjective:  Patient seen and examined this afternoon eating a baked potato and salad for lunch. She denies any acute events overnight. She denies any return of her nausea or vomiting. She reports resolution in her diarrhea symptoms. She has not had a bowel movement today or last night. She denies any bright red bloody or dark, tarry stools. She has been tolerating regular diet. She reports she is ready to go home today and plan is for her to be discharged after resolution of her hypokalemia.   Scheduled Inpatient Medications:  . allopurinol  100 mg Oral Daily  . brinzolamide  1 drop Both Eyes BID  . chlorthalidone  25 mg Oral Daily  . dorzolamide  1 drop Both Eyes BID  . enoxaparin (LOVENOX) injection  40 mg Subcutaneous Q24H  . folic acid  1 mg Oral Daily  . gabapentin  300 mg Oral TID  . insulin aspart  0-5 Units Subcutaneous QHS  . insulin aspart  0-9 Units Subcutaneous TID WC  . latanoprost  1 drop Both Eyes QHS  . magnesium oxide  400 mg Oral BID  . mesalamine  2.4 g Oral Q breakfast  . [START ON 02/17/2018] metFORMIN  500 mg Oral BID WC  . metoprolol succinate  50 mg Oral QHS  . multivitamin with minerals  1 tablet Oral Daily  . pantoprazole  40 mg Oral Daily  . potassium chloride  40 mEq Oral BID  . pravastatin  10 mg Oral q1800  . sucralfate  1 g Oral BID  . thiamine  100 mg Oral Daily   Or  . thiamine  100 mg Intravenous Daily  . vitamin B-12  1,000 mcg Oral Daily    Continuous Inpatient Infusions:   . sodium chloride Stopped (02/16/18 0420)  . potassium chloride 10 mEq (02/16/18 1224)    PRN Inpatient Medications:  sodium chloride, acetaminophen **OR** acetaminophen (TYLENOL) oral liquid 160 mg/5 mL **OR** acetaminophen, LORazepam **OR** LORazepam  Review of Systems: Constitutional: Weight is stable.  Eyes: No changes in vision. ENT: No oral lesions, sore throat.  GI: see HPI.  Heme/Lymph: No easy bruising.  CV: No chest pain.   GU: No hematuria.  Integumentary: No rashes.  Neuro: No headaches.  Psych: No depression/anxiety.  Endocrine: No heat/cold intolerance.  Allergic/Immunologic: No urticaria.  Resp: No cough, SOB.  Musculoskeletal: No joint swelling.    Physical Examination: BP 121/66 (BP Location: Right Arm)   Pulse 88   Temp 98.1 F (36.7 C) (Oral)   Resp 17   Ht 5' (1.524 m)   Wt 75 kg   LMP  (LMP Unknown) Comment: LMP was in her 11's, greater than 2 years ago.  SpO2 96%   BMI 32.28 kg/m  Gen: NAD, alert and oriented x 4 HEENT: PEERLA, EOMI, Neck: supple, no JVD or thyromegaly Chest: CTA bilaterally, no wheezes, crackles, or other adventitious sounds CV: RRR, no m/g/c/r Abd: soft, NT, ND, +BS in all four quadrants; no HSM, guarding, ridigity, or rebound tenderness Ext: no edema, well perfused with 2+ pulses, Skin: no rash or lesions noted Lymph: no LAD  Data: Lab Results  Component Value Date   WBC 6.9 02/16/2018   HGB 10.8 (L) 02/16/2018   HCT 34.4 (L) 02/16/2018   MCV 83.1 02/16/2018   PLT 221 02/16/2018   Recent Labs  Lab 02/14/18 0152 02/14/18 1934 02/16/18 0559  HGB 14.3 13.4 10.8*   Lab Results  Component Value Date  NA 138 02/16/2018   K 3.0 (L) 02/16/2018   CL 105 02/16/2018   CO2 27 02/16/2018   BUN 9 02/16/2018   CREATININE 0.55 02/16/2018   Lab Results  Component Value Date   ALT 91 (H) 02/16/2018   AST 98 (H) 02/16/2018   ALKPHOS 84 02/16/2018   BILITOT 2.4 (H) 02/16/2018   Recent Labs  Lab 02/15/18 1752  INR 1.02   Assessment/Plan:  58 y/o AA female with a PMH of HTN, HLD, DM, COPD, DDD, fatty liver admitted for multiple complaints  1. Alcoholic hepatitis: - Maddrey discriminant function 8.4. Does not meet criteria for steroid therapy - LFTs are improving. Likely related to hx of alcohol abuse. We discussed importance of alcohol cessation.  2. Chronic ulcerative colitis 3. Diarrhea - resolving - Diarrhea symptoms are resolving. Stool studies  not completed. - Continue Lialda 2.4 g daily with breakfast - Follow-up with GI office as scheduled   Please call with questions or concerns.   Octavia Bruckner, PA-C [place]

## 2018-02-16 NOTE — Discharge Instructions (Signed)
Follow-up with a primary care physician in 3 days PCP to check potassium levels during the follow-up visit Outpatient follow-up with gastroenterology in 2 weeks-Dr. Alice Reichert

## 2018-07-17 ENCOUNTER — Other Ambulatory Visit: Payer: Self-pay | Admitting: Gastroenterology

## 2018-07-17 DIAGNOSIS — R7989 Other specified abnormal findings of blood chemistry: Secondary | ICD-10-CM

## 2018-07-17 DIAGNOSIS — R933 Abnormal findings on diagnostic imaging of other parts of digestive tract: Secondary | ICD-10-CM

## 2018-07-28 ENCOUNTER — Ambulatory Visit: Admission: RE | Admit: 2018-07-28 | Payer: Medicaid Other | Source: Ambulatory Visit

## 2018-09-19 ENCOUNTER — Other Ambulatory Visit: Payer: Self-pay | Admitting: Internal Medicine

## 2018-09-19 DIAGNOSIS — Z1231 Encounter for screening mammogram for malignant neoplasm of breast: Secondary | ICD-10-CM

## 2018-10-30 ENCOUNTER — Ambulatory Visit
Admission: RE | Admit: 2018-10-30 | Discharge: 2018-10-30 | Disposition: A | Discharge status: Home / Self Care | Payer: Medicaid Other | Source: Ambulatory Visit | Attending: Internal Medicine | Admitting: Internal Medicine

## 2018-10-30 DIAGNOSIS — Z1231 Encounter for screening mammogram for malignant neoplasm of breast: Secondary | ICD-10-CM | POA: Diagnosis not present

## 2019-02-04 ENCOUNTER — Emergency Department: Payer: Medicaid Other

## 2019-02-04 ENCOUNTER — Other Ambulatory Visit: Payer: Self-pay

## 2019-02-04 ENCOUNTER — Inpatient Hospital Stay
Admission: EM | Admit: 2019-02-04 | Discharge: 2019-02-04 | DRG: 534 | Disposition: A | Discharge status: Other Acute Inpatient Hospital | Payer: Medicaid Other | Attending: Orthopedic Surgery | Admitting: Orthopedic Surgery

## 2019-02-04 ENCOUNTER — Encounter: Payer: Self-pay | Admitting: Emergency Medicine

## 2019-02-04 ENCOUNTER — Inpatient Hospital Stay: Payer: Medicaid Other

## 2019-02-04 ENCOUNTER — Inpatient Hospital Stay (HOSPITAL_COMMUNITY)
Admission: EM | Admit: 2019-02-04 | Discharge: 2019-02-07 | DRG: 481 | Disposition: A | Discharge status: Home Health | Payer: Medicaid Other | Attending: Internal Medicine | Admitting: Internal Medicine

## 2019-02-04 DIAGNOSIS — K760 Fatty (change of) liver, not elsewhere classified: Secondary | ICD-10-CM | POA: Diagnosis present

## 2019-02-04 DIAGNOSIS — W010XXA Fall on same level from slipping, tripping and stumbling without subsequent striking against object, initial encounter: Secondary | ICD-10-CM | POA: Diagnosis present

## 2019-02-04 DIAGNOSIS — Z833 Family history of diabetes mellitus: Secondary | ICD-10-CM

## 2019-02-04 DIAGNOSIS — S72402A Unspecified fracture of lower end of left femur, initial encounter for closed fracture: Secondary | ICD-10-CM

## 2019-02-04 DIAGNOSIS — Z8249 Family history of ischemic heart disease and other diseases of the circulatory system: Secondary | ICD-10-CM | POA: Diagnosis not present

## 2019-02-04 DIAGNOSIS — Z79899 Other long term (current) drug therapy: Secondary | ICD-10-CM

## 2019-02-04 DIAGNOSIS — H409 Unspecified glaucoma: Secondary | ICD-10-CM | POA: Diagnosis present

## 2019-02-04 DIAGNOSIS — Z20822 Contact with and (suspected) exposure to covid-19: Secondary | ICD-10-CM | POA: Diagnosis present

## 2019-02-04 DIAGNOSIS — Y9301 Activity, walking, marching and hiking: Secondary | ICD-10-CM | POA: Diagnosis present

## 2019-02-04 DIAGNOSIS — S72322A Displaced transverse fracture of shaft of left femur, initial encounter for closed fracture: Secondary | ICD-10-CM | POA: Diagnosis not present

## 2019-02-04 DIAGNOSIS — E785 Hyperlipidemia, unspecified: Secondary | ICD-10-CM | POA: Diagnosis present

## 2019-02-04 DIAGNOSIS — S72492A Other fracture of lower end of left femur, initial encounter for closed fracture: Secondary | ICD-10-CM

## 2019-02-04 DIAGNOSIS — Z881 Allergy status to other antibiotic agents status: Secondary | ICD-10-CM | POA: Diagnosis not present

## 2019-02-04 DIAGNOSIS — M5136 Other intervertebral disc degeneration, lumbar region: Secondary | ICD-10-CM | POA: Diagnosis present

## 2019-02-04 DIAGNOSIS — K219 Gastro-esophageal reflux disease without esophagitis: Secondary | ICD-10-CM | POA: Diagnosis present

## 2019-02-04 DIAGNOSIS — Z419 Encounter for procedure for purposes other than remedying health state, unspecified: Secondary | ICD-10-CM

## 2019-02-04 DIAGNOSIS — S72453A Displaced supracondylar fracture without intracondylar extension of lower end of unspecified femur, initial encounter for closed fracture: Secondary | ICD-10-CM | POA: Diagnosis present

## 2019-02-04 DIAGNOSIS — Z7984 Long term (current) use of oral hypoglycemic drugs: Secondary | ICD-10-CM | POA: Diagnosis not present

## 2019-02-04 DIAGNOSIS — E8889 Other specified metabolic disorders: Secondary | ICD-10-CM | POA: Diagnosis present

## 2019-02-04 DIAGNOSIS — N390 Urinary tract infection, site not specified: Secondary | ICD-10-CM | POA: Diagnosis not present

## 2019-02-04 DIAGNOSIS — E876 Hypokalemia: Secondary | ICD-10-CM | POA: Diagnosis present

## 2019-02-04 DIAGNOSIS — Z96653 Presence of artificial knee joint, bilateral: Secondary | ICD-10-CM | POA: Diagnosis not present

## 2019-02-04 DIAGNOSIS — Z7289 Other problems related to lifestyle: Secondary | ICD-10-CM

## 2019-02-04 DIAGNOSIS — E669 Obesity, unspecified: Secondary | ICD-10-CM | POA: Diagnosis present

## 2019-02-04 DIAGNOSIS — Z888 Allergy status to other drugs, medicaments and biological substances status: Secondary | ICD-10-CM | POA: Diagnosis not present

## 2019-02-04 DIAGNOSIS — S8992XA Unspecified injury of left lower leg, initial encounter: Secondary | ICD-10-CM

## 2019-02-04 DIAGNOSIS — Z6838 Body mass index (BMI) 38.0-38.9, adult: Secondary | ICD-10-CM

## 2019-02-04 DIAGNOSIS — J449 Chronic obstructive pulmonary disease, unspecified: Secondary | ICD-10-CM | POA: Diagnosis present

## 2019-02-04 DIAGNOSIS — Z79891 Long term (current) use of opiate analgesic: Secondary | ICD-10-CM | POA: Diagnosis not present

## 2019-02-04 DIAGNOSIS — K5900 Constipation, unspecified: Secondary | ICD-10-CM | POA: Diagnosis not present

## 2019-02-04 DIAGNOSIS — Z87891 Personal history of nicotine dependence: Secondary | ICD-10-CM | POA: Diagnosis not present

## 2019-02-04 DIAGNOSIS — Z880 Allergy status to penicillin: Secondary | ICD-10-CM

## 2019-02-04 DIAGNOSIS — Y92008 Other place in unspecified non-institutional (private) residence as the place of occurrence of the external cause: Secondary | ICD-10-CM | POA: Diagnosis not present

## 2019-02-04 DIAGNOSIS — S7290XA Unspecified fracture of unspecified femur, initial encounter for closed fracture: Secondary | ICD-10-CM

## 2019-02-04 DIAGNOSIS — F1090 Alcohol use, unspecified, uncomplicated: Secondary | ICD-10-CM

## 2019-02-04 DIAGNOSIS — I10 Essential (primary) hypertension: Secondary | ICD-10-CM | POA: Diagnosis present

## 2019-02-04 DIAGNOSIS — M9712XA Periprosthetic fracture around internal prosthetic left knee joint, initial encounter: Principal | ICD-10-CM | POA: Diagnosis present

## 2019-02-04 DIAGNOSIS — E119 Type 2 diabetes mellitus without complications: Secondary | ICD-10-CM

## 2019-02-04 DIAGNOSIS — D72829 Elevated white blood cell count, unspecified: Secondary | ICD-10-CM

## 2019-02-04 DIAGNOSIS — D62 Acute posthemorrhagic anemia: Secondary | ICD-10-CM | POA: Diagnosis not present

## 2019-02-04 DIAGNOSIS — F1021 Alcohol dependence, in remission: Secondary | ICD-10-CM | POA: Diagnosis present

## 2019-02-04 DIAGNOSIS — Z789 Other specified health status: Secondary | ICD-10-CM

## 2019-02-04 DIAGNOSIS — K5190 Ulcerative colitis, unspecified, without complications: Secondary | ICD-10-CM | POA: Diagnosis present

## 2019-02-04 DIAGNOSIS — Z01811 Encounter for preprocedural respiratory examination: Secondary | ICD-10-CM

## 2019-02-04 DIAGNOSIS — M25562 Pain in left knee: Secondary | ICD-10-CM | POA: Diagnosis not present

## 2019-02-04 DIAGNOSIS — T148XXA Other injury of unspecified body region, initial encounter: Secondary | ICD-10-CM

## 2019-02-04 DIAGNOSIS — S72452A Displaced supracondylar fracture without intracondylar extension of lower end of left femur, initial encounter for closed fracture: Secondary | ICD-10-CM | POA: Diagnosis present

## 2019-02-04 DIAGNOSIS — S7292XA Unspecified fracture of left femur, initial encounter for closed fracture: Secondary | ICD-10-CM

## 2019-02-04 DIAGNOSIS — Z01818 Encounter for other preprocedural examination: Secondary | ICD-10-CM

## 2019-02-04 LAB — BASIC METABOLIC PANEL
Anion gap: 9 (ref 5–15)
BUN: 10 mg/dL (ref 6–20)
CO2: 25 mmol/L (ref 22–32)
Calcium: 8.1 mg/dL — ABNORMAL LOW (ref 8.9–10.3)
Chloride: 102 mmol/L (ref 98–111)
Creatinine, Ser: 0.62 mg/dL (ref 0.44–1.00)
GFR calc Af Amer: >60 mL/min (ref >60)
GFR calc non Af Amer: >60 mL/min (ref >60)
Glucose, Bld: 152 mg/dL — ABNORMAL HIGH (ref 70–99)
Potassium: 3.1 mmol/L — ABNORMAL LOW (ref 3.5–5.1)
Sodium: 136 mmol/L (ref 135–145)

## 2019-02-04 LAB — CBC WITH DIFFERENTIAL/PLATELET
Abs Immature Granulocytes: 0.09 10*3/uL — ABNORMAL HIGH (ref 0.00–0.07)
Basophils Absolute: 0.1 10*3/uL (ref 0.0–0.1)
Basophils Relative: 0 %
Eosinophils Absolute: 0 10*3/uL (ref 0.0–0.5)
Eosinophils Relative: 0 %
HCT: 41 % (ref 36.0–46.0)
Hemoglobin: 13 g/dL (ref 12.0–15.0)
Immature Granulocytes: 1 %
Lymphocytes Relative: 17 %
Lymphs Abs: 2.5 10*3/uL (ref 0.7–4.0)
MCH: 26.2 pg (ref 26.0–34.0)
MCHC: 31.7 g/dL (ref 30.0–36.0)
MCV: 82.7 fL (ref 80.0–100.0)
Monocytes Absolute: 1.5 10*3/uL — ABNORMAL HIGH (ref 0.1–1.0)
Monocytes Relative: 10 %
Neutro Abs: 10.9 10*3/uL — ABNORMAL HIGH (ref 1.7–7.7)
Neutrophils Relative %: 72 %
Platelets: 277 10*3/uL (ref 150–400)
RBC: 4.96 MIL/uL (ref 3.87–5.11)
RDW: 13.9 % (ref 11.5–15.5)
WBC: 15 10*3/uL — ABNORMAL HIGH (ref 4.0–10.5)
nRBC: 0 % (ref 0.0–0.2)

## 2019-02-04 LAB — HEMOGLOBIN A1C
Hgb A1c MFr Bld: 6.4 % — ABNORMAL HIGH (ref 4.8–5.6)
Mean Plasma Glucose: 136.98 mg/dL

## 2019-02-04 LAB — PROTIME-INR
INR: 1 (ref 0.8–1.2)
Prothrombin Time: 13 seconds (ref 11.4–15.2)

## 2019-02-04 LAB — VITAMIN D 25 HYDROXY (VIT D DEFICIENCY, FRACTURES): Vit D, 25-Hydroxy: 32.65 ng/mL (ref 30–100)

## 2019-02-04 MED ORDER — THIAMINE HCL 100 MG PO TABS: 100.0000 mg | ORAL_TABLET | Freq: Every day | ORAL | Status: DC

## 2019-02-04 MED ORDER — ONDANSETRON HCL 4 MG/2ML IJ SOLN
4.0000 mg | Freq: Once | INTRAMUSCULAR | Status: AC
  Administered 2019-02-04: 4 mg via INTRAVENOUS
  Filled 2019-02-04: qty 2

## 2019-02-04 MED ORDER — CHLORHEXIDINE GLUCONATE 4 % EX LIQD
60.0000 mL | Freq: Once | CUTANEOUS | Status: AC
  Administered 2019-02-05: 4 via TOPICAL
  Filled 2019-02-04 (×2): qty 60

## 2019-02-04 MED ORDER — POVIDONE-IODINE 10 % EX SWAB: 2.0000 "application " | Freq: Once | CUTANEOUS | Status: DC

## 2019-02-04 MED ORDER — OXYCODONE-ACETAMINOPHEN 5-325 MG PO TABS
1.0000 | ORAL_TABLET | ORAL | Status: DC | PRN
  Administered 2019-02-05 (×2): 1 via ORAL
  Filled 2019-02-04 (×2): qty 1

## 2019-02-04 MED ORDER — HYDRALAZINE HCL 20 MG/ML IJ SOLN: 5.0000 mg | INTRAMUSCULAR | Status: DC | PRN

## 2019-02-04 MED ORDER — METHOCARBAMOL 1000 MG/10ML IJ SOLN
500.0000 mg | Freq: Four times a day (QID) | INTRAVENOUS | Status: DC | PRN
  Filled 2019-02-04: qty 5

## 2019-02-04 MED ORDER — SENNA 8.6 MG PO TABS: 1.0000 | ORAL_TABLET | Freq: Two times a day (BID) | ORAL | Status: DC

## 2019-02-04 MED ORDER — FENTANYL CITRATE (PF) 100 MCG/2ML IJ SOLN
50.0000 ug | INTRAMUSCULAR | Status: DC | PRN
  Administered 2019-02-04: 23:00:00 50 ug via INTRAVENOUS
  Filled 2019-02-04: qty 2

## 2019-02-04 MED ORDER — BISACODYL 5 MG PO TBEC: 10.0000 mg | DELAYED_RELEASE_TABLET | Freq: Every day | ORAL | Status: DC | PRN

## 2019-02-04 MED ORDER — LORAZEPAM 2 MG/ML IJ SOLN: 1.0000 mg | INTRAMUSCULAR | Status: DC | PRN

## 2019-02-04 MED ORDER — FLEET ENEMA 7-19 GM/118ML RE ENEM: 1.0000 | ENEMA | Freq: Once | RECTAL | Status: DC | PRN

## 2019-02-04 MED ORDER — METHOCARBAMOL 500 MG PO TABS: 500.0000 mg | ORAL_TABLET | Freq: Four times a day (QID) | ORAL | Status: DC | PRN

## 2019-02-04 MED ORDER — LORAZEPAM 1 MG PO TABS: 1.0000 mg | ORAL_TABLET | ORAL | Status: DC | PRN

## 2019-02-04 MED ORDER — INSULIN ASPART 100 UNIT/ML ~~LOC~~ SOLN: 0.0000 [IU] | Freq: Every day | SUBCUTANEOUS | Status: DC

## 2019-02-04 MED ORDER — MORPHINE SULFATE (PF) 4 MG/ML IV SOLN: 4.0000 mg | Freq: Once | INTRAVENOUS | Status: DC

## 2019-02-04 MED ORDER — DOCUSATE SODIUM 100 MG PO CAPS: 100.0000 mg | ORAL_CAPSULE | Freq: Two times a day (BID) | ORAL | Status: DC

## 2019-02-04 MED ORDER — FOLIC ACID 1 MG PO TABS: 1.0000 mg | ORAL_TABLET | Freq: Every day | ORAL | Status: DC

## 2019-02-04 MED ORDER — ACETAMINOPHEN 325 MG PO TABS: 650.0000 mg | ORAL_TABLET | Freq: Four times a day (QID) | ORAL | Status: DC | PRN

## 2019-02-04 MED ORDER — INSULIN ASPART 100 UNIT/ML ~~LOC~~ SOLN
0.0000 [IU] | SUBCUTANEOUS | Status: DC
  Administered 2019-02-05: 3 [IU] via SUBCUTANEOUS
  Administered 2019-02-05: 20:00:00 5 [IU] via SUBCUTANEOUS
  Administered 2019-02-05: 2 [IU] via SUBCUTANEOUS
  Administered 2019-02-05: 5 [IU] via SUBCUTANEOUS
  Administered 2019-02-05: 1 [IU] via SUBCUTANEOUS
  Administered 2019-02-06: 3 [IU] via SUBCUTANEOUS
  Administered 2019-02-06: 12:00:00 2 [IU] via SUBCUTANEOUS
  Administered 2019-02-06: 3 [IU] via SUBCUTANEOUS
  Administered 2019-02-06: 09:00:00 1 [IU] via SUBCUTANEOUS
  Administered 2019-02-06: 2 [IU] via SUBCUTANEOUS
  Administered 2019-02-06 – 2019-02-07 (×4): 1 [IU] via SUBCUTANEOUS

## 2019-02-04 MED ORDER — SODIUM CHLORIDE 0.9 % IV BOLUS
1000.0000 mL | Freq: Once | INTRAVENOUS | Status: AC
  Administered 2019-02-04: 1000 mL via INTRAVENOUS

## 2019-02-04 MED ORDER — MORPHINE SULFATE (PF) 4 MG/ML IV SOLN
4.0000 mg | Freq: Once | INTRAVENOUS | Status: AC
  Administered 2019-02-04: 4 mg via INTRAVENOUS
  Filled 2019-02-04: qty 1

## 2019-02-04 MED ORDER — SENNA 8.6 MG PO TABS: 1.0000 | ORAL_TABLET | Freq: Every day | ORAL | Status: DC

## 2019-02-04 MED ORDER — ONDANSETRON HCL 4 MG/2ML IJ SOLN
4.0000 mg | Freq: Once | INTRAMUSCULAR | Status: AC
  Administered 2019-02-04: 20:00:00 4 mg via INTRAVENOUS

## 2019-02-04 MED ORDER — CLINDAMYCIN PHOSPHATE 900 MG/50ML IV SOLN
900.0000 mg | INTRAVENOUS | Status: AC
  Administered 2019-02-05: 900 mg via INTRAVENOUS
  Filled 2019-02-04: qty 50

## 2019-02-04 MED ORDER — OXYCODONE HCL 5 MG PO TABS: 5.0000 mg | ORAL_TABLET | ORAL | Status: DC | PRN

## 2019-02-04 MED ORDER — ONDANSETRON HCL 4 MG/2ML IJ SOLN
INTRAMUSCULAR | Status: AC
  Filled 2019-02-04: qty 2

## 2019-02-04 MED ORDER — MORPHINE SULFATE (PF) 2 MG/ML IV SOLN: 2.0000 mg | INTRAVENOUS | Status: DC | PRN

## 2019-02-04 MED ORDER — ACETAMINOPHEN 650 MG RE SUPP: 650.0000 mg | Freq: Four times a day (QID) | RECTAL | Status: DC | PRN

## 2019-02-04 MED ORDER — THIAMINE HCL 100 MG/ML IJ SOLN: 100.0000 mg | Freq: Every day | INTRAMUSCULAR | Status: DC

## 2019-02-04 MED ORDER — SODIUM CHLORIDE 0.9 % IV SOLN: INTRAVENOUS | Status: DC

## 2019-02-04 MED ORDER — LACTATED RINGERS IV SOLN: INTRAVENOUS | Status: DC

## 2019-02-04 MED ORDER — ADULT MULTIVITAMIN W/MINERALS CH: 1.0000 | ORAL_TABLET | Freq: Every day | ORAL | Status: DC

## 2019-02-04 MED ORDER — MAGNESIUM HYDROXIDE 400 MG/5ML PO SUSP: 30.0000 mL | Freq: Every day | ORAL | Status: DC | PRN

## 2019-02-04 MED ORDER — INSULIN ASPART 100 UNIT/ML ~~LOC~~ SOLN: 0.0000 [IU] | Freq: Three times a day (TID) | SUBCUTANEOUS | Status: DC

## 2019-02-04 NOTE — ED Provider Notes (Addendum)
[facility] Emergency Department Provider Note  ____________________________________________  Time seen: Approximately 4:12 PM  I have reviewed the triage vital signs and the nursing notes.   HISTORY  Chief Complaint Knee Pain    HPI Sheila [name] is a 59 y.o. female who presents to the emergency department complaining of knee pain/injury. Patient states that she was walking outside today, slipped on a wet surface. She tried to catch herself by planning her left leg but she states that in the process twisted, she heard and felt a popping sound/sensation, immediately fell. She states that she was unable to move her left lower extremity, had excruciating pain and was laying on the wet ground for approximately 45 min until family member found her. She did not hit her head or lose consciousness. She remembers the entire event. Patient denies any other complaint other than knee pain at this time. She denies any back pain, hip pain. No numbness or tingling in the left lower extremity. 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 She states that she believes the last one was her left knee which reportedly was replaced 2 years ago. She believes that Dr. Rudene Christians performed the surgery. On review of patient's medical records I only see the right knee being replaced by Dr. Rudene Christians. This was done in 2017. I cannot find any information regarding the left knee replacement.         Past Medical History:  Diagnosis Date  . Anemia   . Arthritis   . Asthma   . COPD (chronic obstructive pulmonary disease) ([place])   . DDD (degenerative disc disease), lumbar   . Diabetes mellitus without complication ([place])    on metformin  . DJD (degenerative joint disease)   . Dysrhythmia   . Fatty liver   . Fibroids   . GERD (gastroesophageal reflux disease)   . Glaucoma   . Hyperlipidemia   . Hypertension   . IBS (irritable bowel syndrome)   . Lumbago   . Neuromuscular disorder  ([place])   . 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[place]) 70/9628  . Renal disorder   . Ulcerative colitis ([place])    on mesalamine    Patient Active Problem List   Diagnosis Date Noted  . Closed displaced supracondylar fracture of distal end of left femur without intracondylar extension ([place]) 02/04/2019  . Right leg weakness 02/14/2018  . Elevated bilirubin 02/14/2018  . SIRS (systemic inflammatory response syndrome) ([place]) 01/30/2018  . Hypoglycemia 01/30/2018  . Lactic acidosis 01/30/2018  . Alcohol abuse 01/30/2018  . Abdominal pain, chronic, epigastric   . Pancreatitis 08/29/2017  . Diabetes ([place]) 08/29/2017  . HTN (hypertension) 08/29/2017  . HLD (hyperlipidemia) 08/29/2017  . GERD (gastroesophageal reflux disease) 08/29/2017  . Hypokalemia 08/29/2017  . Hypocalcemia 08/29/2017  . Primary osteoarthritis of right knee 01/14/2015    Past Surgical History:  Procedure Laterality Date  . COLONOSCOPY WITH PROPOFOL N/A 08/19/2015   Procedure: COLONOSCOPY WITH PROPOFOL;  Surgeon: Lollie Sails, MD;  Location: [facility] ENDOSCOPY;  Service: Endoscopy;  Laterality: N/A;  . COLONOSCOPY WITH PROPOFOL N/A 12/08/2015   Procedure: COLONOSCOPY WITH PROPOFOL;  Surgeon: Lollie Sails, MD;  Location: [facility] ENDOSCOPY;  Service: Endoscopy;  Laterality: N/A;  . COLONOSCOPY WITH PROPOFOL N/A 12/09/2015   Procedure: COLONOSCOPY WITH PROPOFOL;  Surgeon: Lollie Sails, MD;  Location: [facility] ENDOSCOPY;  Service: Endoscopy;  Laterality: N/A;  . ESOPHAGOGASTRODUODENOSCOPY (EGD) WITH PROPOFOL N/A 11/08/2017   Procedure: ESOPHAGOGASTRODUODENOSCOPY (EGD) WITH PROPOFOL;  Surgeon: Gustavo Lah,  Billie Ruddy, MD;  Location: [facility] ENDOSCOPY;  Service: Endoscopy;  Laterality: N/A;  . JOINT REPLACEMENT Left 04/19/2006  . JOINT REPLACEMENT Right 01/14/2015  . TOTAL KNEE ARTHROPLASTY Left 2008  . TOTAL KNEE ARTHROPLASTY Right 01/14/2015   Procedure: TOTAL KNEE ARTHROPLASTY;  Surgeon: Hessie Knows, MD;  Location: [facility] ORS;  Service:  Orthopedics;  Laterality: Right;    Prior to Admission medications   Medication Sig Start Date End Date Taking? Authorizing Provider  albuterol (PROVENTIL HFA;VENTOLIN HFA) 108 (90 BASE) MCG/ACT inhaler Inhale 2 puffs into the lungs every 4 (four) hours as needed for wheezing or shortness of breath.   Yes [provider]  allopurinol (ZYLOPRIM) 100 MG tablet Take 100 mg by mouth daily. 08/04/17  Yes [provider]  bimatoprost (LUMIGAN) 0.03 % ophthalmic solution Place 1 drop into both eyes at bedtime.   Yes [provider]  brinzolamide (AZOPT) 1 % ophthalmic suspension Place 1 drop into both eyes 2 (two) times daily.   Yes [provider]  chlorthalidone (HYGROTON) 25 MG tablet Take 25 mg by mouth daily.  01/04/15  Yes [provider]  [employer] VITAMIN B12 1000 MCG tablet Take 1,000 mcg by mouth daily. 07/20/17  Yes [provider]  folic acid (FOLVITE) 1 MG tablet Take 1 tablet (1 mg total) by mouth daily. 02/17/18  Yes Gouru, Illene Silver, MD  gabapentin (NEURONTIN) 300 MG capsule Take 1 capsule (300 mg total) by mouth 3 (three) times daily. Patient taking differently: Take 300 mg by mouth 2 (two) times daily.  02/16/18  Yes Gouru, Illene Silver, MD  HYDROcodone-acetaminophen (NORCO) 10-325 MG tablet Take 1 tablet by mouth every 12 (twelve) hours.   Yes [provider]  KLOR-CON M20 20 MEQ tablet Take 2 tablets (40 mEq total) by mouth daily. 02/16/18  Yes Gouru, Illene Silver, MD  linagliptin (TRADJENTA) 5 MG TABS tablet Take 1 tablet (5 mg total) by mouth daily. 02/01/18  Yes Max Sane, MD  losartan (COZAAR) 25 MG tablet Take 25 mg by mouth daily.    Yes [provider]  mesalamine (LIALDA) 1.2 g EC tablet Take 2.4 g by mouth daily with breakfast.   Yes [provider]  metFORMIN (GLUCOPHAGE-XR) 500 MG 24 hr tablet Take 500 mg by mouth 2 (two) times daily.   Yes [provider]  metoprolol succinate (TOPROL-XL) 50 MG 24 hr tablet Take  50 mg by mouth at bedtime. 01/17/18  Yes [provider]  Multiple Vitamin (MULTIVITAMIN WITH MINERALS) TABS tablet Take 1 tablet by mouth daily. 02/17/18  Yes Gouru, Illene Silver, MD  pantoprazole (PROTONIX) 40 MG tablet Take 40 mg by mouth every morning.   Yes [provider]  pravastatin (PRAVACHOL) 10 MG tablet Take 10 mg by mouth at bedtime.   Yes [provider]  sucralfate (CARAFATE) 1 g tablet Take 1 g by mouth 2 (two) times daily.   Yes [provider]  thiamine 100 MG tablet Take 1 tablet (100 mg total) by mouth daily. 02/17/18  Yes Gouru, Illene Silver, MD    Allergies Ampicillin, Erythromycin, Omeprazole, Penicillins, Triamterene-hctz, and Quinapril  Family History  Problem Relation Age of Onset  . Hypertension Mother   . Diabetes Father   . Hypertension Father   . Diabetes Brother   . Hypertension Brother   . Breast cancer Neg Hx     Social History Social History   Tobacco Use  . Smoking status: Former Smoker    Quit date: 01/04/1978    Years  since quitting: 41.1  . Smokeless tobacco: Never Used  Substance Use Topics  . Alcohol use: Yes    Comment: Recovering alcoholic since 31/4970  . Drug use: No    Comment: used cocaine, quit in 2003     Review of Systems  Constitutional: No fever/chills Eyes: No visual changes. No discharge ENT: No upper respiratory complaints. Cardiovascular: no chest pain. Respiratory: no cough. No SOB. Gastrointestinal: No abdominal pain.  No nausea, no vomiting. Musculoskeletal: Fall resulting in left knee injury/pain. Skin: Negative for rash, abrasions, lacerations, ecchymosis. Neurological: Negative for headaches, focal weakness or numbness. 10-point ROS otherwise negative.  ____________________________________________   PHYSICAL EXAM:  VITAL SIGNS: ED Triage Vitals  Enc Vitals Group     BP 02/04/19 1526 120/67     Pulse Rate 02/04/19 1526 82     Resp 02/04/19 1526 18     Temp 02/04/19 1526 97.6 F  (36.4 C)     Temp Source 02/04/19 1526 Oral     SpO2 02/04/19 1526 100 %     Weight 02/04/19 1527 195 lb (88.5 kg)     Height 02/04/19 1527 5' (1.524 m)     Head Circumference --      Peak Flow --      Pain Score 02/04/19 1527 10     Pain Loc --      Pain Edu? --      Excl. in [place]? --      Constitutional: Alert and oriented. Well appearing and in no acute distress. Eyes: Conjunctivae are normal. PERRL. EOMI. Head: Atraumatic. ENT:      Ears:       Nose: No congestion/rhinnorhea.      Mouth/Throat: Mucous membranes are moist.  Neck: No stridor.  No cervical spine tenderness to palpation.  Cardiovascular: Normal rate, regular rhythm. Normal S1 and S2.  Good peripheral circulation. Respiratory: Normal respiratory effort without tachypnea or retractions. Lungs CTAB. Good air entry to the bases with no decreased or absent breath sounds. Musculoskeletal: Limited range of motion to left lower extremity. Visualization of the left lower extremity reveals deformity about the left knee. On palpation, patient does have exquisite tenderness to palpation over the distal femur. Palpable abnormality is appreciated in this region as well. No range of motion is performed with the left knee. Palpation of the remainder of the left leg to include the proximal femur, hip, distal tibia and fibular region reveals no tenderness or palpable findings. Dorsalis pedis pulse intact distally. Sensation intact distally. Neurologic:  Normal speech and language. No gross focal neurologic deficits are appreciated.  Skin:  Skin is warm, dry and intact. No rash noted. Psychiatric: Mood and affect are normal. Speech and behavior are normal. Patient exhibits appropriate insight and judgement.   ____________________________________________   LABS (all labs ordered are listed, but only abnormal results are displayed)  Labs Reviewed  URINE CULTURE  HIV ANTIBODY (ROUTINE TESTING W REFLEX)  PROTIME-INR  CBC WITH  DIFFERENTIAL/PLATELET  COMPREHENSIVE METABOLIC PANEL  APTT  CBC  BASIC METABOLIC PANEL  TYPE AND SCREEN   ____________________________________________  EKG   ____________________________________________  RADIOLOGY I personally viewed and evaluated these images as part of my medical decision making, as well as reviewing the written report by the radiologist.  DG Knee Complete 4 Views Left  Result Date: 02/04/2019 CLINICAL DATA:  Fall. Left knee pain and deformity. Previous knee replacement. Initial encounter. EXAM: LEFT KNEE - COMPLETE 4+ VIEW COMPARISON:  04/19/2006 FINDINGS: Total knee arthroplasty is  seen. Acute comminuted fracture is seen through the distal femoral metaphysis, just above the femoral prosthetic component. Medial and posterior displacement of the distal fracture fragment is seen. Old fracture deformity of distal femoral diaphysis is also noted. IMPRESSION: Acute comminuted fracture of the distal femoral metaphysis, just above the femoral component of knee prosthesis. Electronically Signed   By: Marlaine Hind M.D.   On: 02/04/2019 15:45   DG FEMUR MIN 2 VIEWS LEFT  Result Date: 02/04/2019 CLINICAL DATA:  Pain status post fall EXAM: LEFT FEMUR 2 VIEWS COMPARISON:  None. FINDINGS: There is an acute impacted displaced fracture of the distal left femur superior to the knee prosthesis. There is an old healed fracture of the left femoral diaphysis. The patient is status post total knee arthroplasty. There is a suprapatellar joint effusion with evidence for lipohemarthrosis. IMPRESSION: Acute displaced impacted fracture of the distal left femur. Status post total knee arthroplasty. Old healed fracture of the distal left femoral diaphysis. Electronically Signed   By: Constance Holster M.D.   On: 02/04/2019 18:37    ____________________________________________    PROCEDURES  Procedure(s) performed:    Procedures    Medications  morphine 4 MG/ML injection 4 mg (has no  administration in time range)  0.9 %  sodium chloride infusion (has no administration in time range)  morphine 2 MG/ML injection 2 mg (has no administration in time range)  oxyCODONE (Oxy IR/ROXICODONE) immediate release tablet 5-10 mg (has no administration in time range)  methocarbamol (ROBAXIN) tablet 500 mg (has no administration in time range)    Or  methocarbamol (ROBAXIN) 500 mg in dextrose 5 % 50 mL IVPB (has no administration in time range)  docusate sodium (COLACE) capsule 100 mg (has no administration in time range)  magnesium hydroxide (MILK OF MAGNESIA) suspension 30 mL (has no administration in time range)  bisacodyl (DULCOLAX) EC tablet 10 mg (has no administration in time range)  sodium phosphate (FLEET) 7-19 GM/118ML enema 1 enema (has no administration in time range)  morphine 4 MG/ML injection 4 mg (4 mg Intravenous Given 02/04/19 1634)  ondansetron (ZOFRAN) injection 4 mg (4 mg Intravenous Given 02/04/19 1632)  sodium chloride 0.9 % bolus 1,000 mL (1,000 mLs Intravenous New Bag/Given 02/04/19 1631)     ____________________________________________   INITIAL IMPRESSION / ASSESSMENT AND PLAN / ED COURSE  Pertinent labs & imaging results that were available during my care of the patient were reviewed by me and considered in my medical decision making (see chart for details).  Review of the [place] CSRS was performed in accordance of the [place] prior to dispensing any controlled drugs.  Clinical Course as of Feb 04 1924  Sun Feb 04, 2019  1617 Patient presented to emergency department with a left knee injury. Patient has a history of knee replacement to the knee. Unsure the surgeon who performed this replacement. I have contacted on-call orthopedic surgeon, Dr. Geanie Cooley. He is currently in the OR and will return my call for recommendations once he is out of his current case.   [JC]    Clinical Course User Index [JC] Shonna Deiter, Charline Bills, PA-C          Patient's diagnosis  is consistent with distal femur fracture. Patient presented to emergency department with mechanical injury causing injury to the left knee. Patient has findings concerning for distal femur fracture. Patient does have a history of knee replacement to the left knee, however it is unsure exact date and surgeon who performed this replacement.  Patient has medical records regarding her right knee replacement, however I have been unable to find any evidence of her left knee being replaced.. Dr. Mack Guise came to the emergency department and evaluated the patient.  Patient will be admitted to the hospital at this time, however patient will likely be transferred ultimately for definitive repair.  At this time patient care will be transferred to the orthopedic service.   Addendum: I was contacted by orthopedic surgeon, Dr. Mack Guise.  After Dr. Mack Guise had talked to the trauma services at [facility] it was decided the patient would best be suited with transfer to the trauma services [facility] for definitive repair.  At this time, we will contact [facility] for transfer.  Trauma surgeon, Dr. Marcelino Scot is the accepting physician.  Patient is stable at this time.  Patient will be transferred to [facility] emergency department to be admitted to the hospitalist and trauma Ortho service.  Patient is stable prior to transfer.  Patient is aware of reasons for transfer.  Patient will be transferred via [facility] ambulance service.  ____________________________________________  FINAL CLINICAL IMPRESSION(S) / ED DIAGNOSES  Final diagnoses:  Other closed fracture of distal end of left femur, initial encounter ([place])      NEW MEDICATIONS STARTED DURING THIS VISIT:  ED Discharge Orders    None          This chart was dictated using voice recognition software/Dragon. Despite best efforts to proofread, errors can occur which can change the meaning. Any change was purely  unintentional.    Darletta Moll, PA-C 02/04/19 1927    Harding Thomure, Charline Bills, PA-C 02/04/19 2121    Drenda Freeze, MD 02/08/19 1500

## 2019-02-04 NOTE — ED Provider Notes (Signed)
Update note  59 year old lady initially presented to [facility] for mechanical fall, found to have periprosthetic knee injury.  Case reviewed by orthopedics at [facility] who reviewed case with orthopedics at [facility].  Dr. Marcelino Scot is planning to take to the OR tomorrow.  Ortho trauma has already left a brief note.  Basic preop labs ordered.  Discussed with hospitalist service who will admit.  As needed pain meds ordered.  Pain currently well controlled, neurovascularly intact, knee immobilizer in place.   Lucrezia Starch, MD 02/04/19 [phone]

## 2019-02-04 NOTE — Progress Notes (Addendum)
Orthopaedic Trauma Service   Orthopedic trauma service aware patient Accepted patient in transfer from Dr. Mack Guise at [facility] due to complex left periprosthetic distal femur fracture with femoral shaft malunion. We agree that transfer to [facility] is necessary to address the patients complex fracture  Looks as if the patient will be ER to ER transfer   Patient has numerous chronical medical comorbidities and is on numerous medications  Patient is fracture will require surgical intervention  Patient has been posted for 0800 on 02/05/2019  Please make n.p.o. after midnight Consent for ORIF left periprosthetic distal femur fracture Appears that only her Covid test has been sent at this time.  Preoperative laboratory work-up including CBC, CMET, coags, vitamin D panel (fracture resulted from low-energy fall)  Jari Pigg, PA-C [phone] (C) 02/04/2019, 9:53 PM  [facility] [place] [address] [phone] Domingo Sep (F)

## 2019-02-04 NOTE — H&P (Addendum)
History and Physical    [name]:[id number] DOB: 01/19/1960 DOA: 02/04/2019  PCP: Ellamae Sia, MD Patient coming from: [facility]  Chief Complaint: Left femur fracture  HPI: Sheila [name] is a 59 y.o. female with medical history significant of anemia, arthritis, asthma, COPD, lumbar DDD, non-insulin-dependent type 2 diabetes, hypertension, hyperlipidemia, neuromuscular disorder, ulcerative colitis, alcohol use disorder, and conditions listed below presented to [facility] regional ED with complaints of left knee pain after mechanical fall.  Imaging revealed left closed periprosthetic distal femur fracture in the setting of previous femoral shaft nonunion.  Patient was seen by orthopedics at [facility] and transferred to [facility] for further management by orthopedics here given the complexity of the fracture.  Orthopedics is aware that the patient is here and are planning for surgery in the morning.  Patient states while taking out her trash she slipped and fell because it was slippery outside.  As soon as she fell she heard a big pop from her left knee and since then is having a lot of pain in this knee.  Reports history of prior bilateral knee replacement surgeries.  Denies any sick contacts or recent illness.  No fevers, chills, chest pain, shortness of breath, cough, nausea, vomiting, abdominal pain, or diarrhea.  Does report having dysuria and urinary frequency/urgency.  Review of Systems:  All systems reviewed and apart from history of presenting illness, are negative.  Past Medical History:  Diagnosis Date  . Anemia   . Arthritis   . Asthma   . COPD (chronic obstructive pulmonary disease) ([place])   . DDD (degenerative disc disease), lumbar   . Diabetes mellitus without complication ([place])    on metformin  . DJD (degenerative joint disease)   . Dysrhythmia   . Fatty liver   . Fibroids   . GERD (gastroesophageal reflux disease)   . Glaucoma   . Hyperlipidemia   .  Hypertension   . IBS (irritable bowel syndrome)   . Lumbago   . Neuromuscular disorder ([place])   . 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[place]) 19/7588  . Renal disorder   . Ulcerative colitis ([place])    on mesalamine    Past Surgical History:  Procedure Laterality Date  . COLONOSCOPY WITH PROPOFOL N/A 08/19/2015   Procedure: COLONOSCOPY WITH PROPOFOL;  Surgeon: Lollie Sails, MD;  Location: [facility] ENDOSCOPY;  Service: Endoscopy;  Laterality: N/A;  . COLONOSCOPY WITH PROPOFOL N/A 12/08/2015   Procedure: COLONOSCOPY WITH PROPOFOL;  Surgeon: Lollie Sails, MD;  Location: [facility] ENDOSCOPY;  Service: Endoscopy;  Laterality: N/A;  . COLONOSCOPY WITH PROPOFOL N/A 12/09/2015   Procedure: COLONOSCOPY WITH PROPOFOL;  Surgeon: Lollie Sails, MD;  Location: [facility] ENDOSCOPY;  Service: Endoscopy;  Laterality: N/A;  . ESOPHAGOGASTRODUODENOSCOPY (EGD) WITH PROPOFOL N/A 11/08/2017   Procedure: ESOPHAGOGASTRODUODENOSCOPY (EGD) WITH PROPOFOL;  Surgeon: Lollie Sails, MD;  Location: [facility] ENDOSCOPY;  Service: Endoscopy;  Laterality: N/A;  . JOINT REPLACEMENT Left 04/19/2006  . JOINT REPLACEMENT Right 01/14/2015  . TOTAL KNEE ARTHROPLASTY Left 2008  . TOTAL KNEE ARTHROPLASTY Right 01/14/2015   Procedure: TOTAL KNEE ARTHROPLASTY;  Surgeon: Hessie Knows, MD;  Location: [facility] ORS;  Service: Orthopedics;  Laterality: Right;     reports that she quit smoking about 41 years ago. She has never used smokeless tobacco. She reports current alcohol use. She reports that she does not use drugs.  Allergies  Allergen Reactions  . Ampicillin Anaphylaxis  . Erythromycin Anaphylaxis  . Omeprazole Anaphylaxis  . Penicillins Anaphylaxis and Other (See  Comments)    Has patient had a PCN reaction causing immediate rash, facial/tongue/throat swelling, SOB or lightheadedness with hypotension: Yes Has patient had a PCN reaction causing severe rash involving mucus membranes or skin necrosis: No Has patient had a PCN reaction that required  hospitalization: Unknown Has patient had a PCN reaction occurring within the last 10 years: Yes If all of the above answers are "NO", then may proceed with Cephalosporin use.   . Triamterene-Hctz Shortness Of Breath and Swelling  . Quinapril Cough    Family History  Problem Relation Age of Onset  . Hypertension Mother   . Diabetes Father   . Hypertension Father   . Diabetes Brother   . Hypertension Brother   . Breast cancer Neg Hx     Prior to Admission medications   Medication Sig Start Date End Date Taking? Authorizing Provider  albuterol (PROVENTIL HFA;VENTOLIN HFA) 108 (90 BASE) MCG/ACT inhaler Inhale 2 puffs into the lungs every 4 (four) hours as needed for wheezing or shortness of breath.    [provider]  allopurinol (ZYLOPRIM) 100 MG tablet Take 100 mg by mouth daily. 08/04/17   [provider]  bimatoprost (LUMIGAN) 0.03 % ophthalmic solution Place 1 drop into both eyes at bedtime.    [provider]  brinzolamide (AZOPT) 1 % ophthalmic suspension Place 1 drop into both eyes 2 (two) times daily.    [provider]  chlorthalidone (HYGROTON) 25 MG tablet Take 25 mg by mouth daily.  01/04/15   [provider]  [employer] VITAMIN B12 1000 MCG tablet Take 1,000 mcg by mouth daily. 07/20/17   [provider]  folic acid (FOLVITE) 1 MG tablet Take 1 tablet (1 mg total) by mouth daily. 02/17/18   Sheila Mango, MD  gabapentin (NEURONTIN) 300 MG capsule Take 1 capsule (300 mg total) by mouth 3 (three) times daily. Patient taking differently: Take 300 mg by mouth 2 (two) times daily.  02/16/18   Sheila Mango, MD  HYDROcodone-acetaminophen (NORCO) 10-325 MG tablet Take 1 tablet by mouth every 12 (twelve) hours.    [provider]  KLOR-CON M20 20 MEQ tablet Take 2 tablets (40 mEq total) by mouth daily. 02/16/18   Gouru, Illene Silver, MD  linagliptin (TRADJENTA) 5 MG TABS tablet Take 1 tablet (5 mg total) by mouth daily. 02/01/18   Max Sane,  MD  losartan (COZAAR) 25 MG tablet Take 25 mg by mouth daily.     [provider]  mesalamine (LIALDA) 1.2 g EC tablet Take 2.4 g by mouth daily with breakfast.    [provider]  metFORMIN (GLUCOPHAGE-XR) 500 MG 24 hr tablet Take 500 mg by mouth 2 (two) times daily.    [provider]  metoprolol succinate (TOPROL-XL) 50 MG 24 hr tablet Take 50 mg by mouth at bedtime. 01/17/18   [provider]  Multiple Vitamin (MULTIVITAMIN WITH MINERALS) TABS tablet Take 1 tablet by mouth daily. 02/17/18   Gouru, Illene Silver, MD  pantoprazole (PROTONIX) 40 MG tablet Take 40 mg by mouth every morning.    [provider]  pravastatin (PRAVACHOL) 10 MG tablet Take 10 mg by mouth at bedtime.    [provider]  sucralfate (CARAFATE) 1 g tablet Take 1 g by mouth 2 (two) times daily.    [provider]  thiamine 100 MG tablet Take 1 tablet (100 mg total) by mouth daily. 02/17/18   Sheila Mango, MD    Physical Exam: There were no vitals  filed for this visit.  Physical Exam  Constitutional: [name] oriented to person, place, and time. She appears well-developed and well-nourished. No distress.  HENT:  Head: Normocephalic.  Eyes: Right eye exhibits no discharge. Left eye exhibits no discharge.  Cardiovascular: Normal rate, regular rhythm and intact distal pulses.  Pulmonary/Chest: Effort normal and breath sounds normal. No respiratory distress. She has no wheezes. She has no rales.  Abdominal: Soft. Bowel sounds are normal. She exhibits no distension. There is no abdominal tenderness. There is no guarding.  Musculoskeletal:        General: No edema.     Cervical back: Neck supple.     Comments: Left knee immobilizer in place  Neurological: [name] alert and oriented to person, place, and time.  Skin: Skin is warm and dry. [name] not diaphoretic.     Labs on Admission: I have personally reviewed following labs and imaging studies  CBC: Recent Labs    Lab 02-07-19 2254  WBC 15.0*  NEUTROABS 10.9*  HGB 13.0  HCT 41.0  MCV 82.7  PLT 759   Basic Metabolic Panel: No results for input(s): NA, K, CL, CO2, GLUCOSE, BUN, CREATININE, CALCIUM, MG, PHOS in the last 168 hours. GFR: CrCl cannot be calculated (Patient's most recent lab result is older than the maximum 21 days allowed.). Liver Function Tests: No results for input(s): AST, ALT, ALKPHOS, BILITOT, PROT, ALBUMIN in the last 168 hours. No results for input(s): LIPASE, AMYLASE in the last 168 hours. No results for input(s): AMMONIA in the last 168 hours. Coagulation Profile: Recent Labs  Lab 02/07/2019 2254  INR 1.0   Cardiac Enzymes: No results for input(s): CKTOTAL, CKMB, CKMBINDEX, TROPONINI in the last 168 hours. BNP (last 3 results) No results for input(s): PROBNP in the last 8760 hours. HbA1C: No results for input(s): HGBA1C in the last 72 hours. CBG: No results for input(s): GLUCAP in the last 168 hours. Lipid Profile: No results for input(s): CHOL, HDL, LDLCALC, TRIG, CHOLHDL, LDLDIRECT in the last 72 hours. Thyroid Function Tests: No results for input(s): TSH, T4TOTAL, FREET4, T3FREE, THYROIDAB in the last 72 hours. Anemia Panel: No results for input(s): VITAMINB12, FOLATE, FERRITIN, TIBC, IRON, RETICCTPCT in the last 72 hours. Urine analysis:    Component Value Date/Time   COLORURINE YELLOW (A) 02/14/2018 0152   APPEARANCEUR CLEAR (A) 02/14/2018 0152   LABSPEC 1.020 02/14/2018 0152   PHURINE 5.0 02/14/2018 [place] 02/14/2018 0152   HGBUR NEGATIVE 02/14/2018 0152   BILIRUBINUR NEGATIVE 02/14/2018 0152   KETONESUR NEGATIVE 02/14/2018 0152   PROTEINUR NEGATIVE 02/14/2018 0152   NITRITE NEGATIVE 02/14/2018 0152   LEUKOCYTESUR NEGATIVE 02/14/2018 0152    Radiological Exams on Admission: Chest Portable 1 View  Result Date: 2019/02/07 CLINICAL DATA:  Initial evaluation for acute trauma, fall. EXAM: PORTABLE CHEST 1 VIEW COMPARISON:  Prior  radiograph from 01/29/2018. FINDINGS: Cardiac and mediastinal silhouettes are stable in size and contour, and remain within normal limits. Lungs well inflated. No focal infiltrates. No edema or effusion. No pneumothorax. Remotely healed left clavicular fracture noted. Few additional remotely healed left-sided rib fractures. No acute osseous abnormality. Visualized soft tissues demonstrate no acute finding. IMPRESSION: 1. No active cardiopulmonary disease. 2. Remotely healed left clavicular and left-sided rib fractures, stable. No acute osseous abnormality. Electronically Signed   By: Jeannine Boga M.D.   On: 07-Feb-2019 19:50   DG Knee Complete 4 Views Left  Result Date: 02/07/2019 CLINICAL DATA:  Fall. Left knee pain  and deformity. Previous knee replacement. Initial encounter. EXAM: LEFT KNEE - COMPLETE 4+ VIEW COMPARISON:  04/19/2006 FINDINGS: Total knee arthroplasty is seen. Acute comminuted fracture is seen through the distal femoral metaphysis, just above the femoral prosthetic component. Medial and posterior displacement of the distal fracture fragment is seen. Old fracture deformity of distal femoral diaphysis is also noted. IMPRESSION: Acute comminuted fracture of the distal femoral metaphysis, just above the femoral component of knee prosthesis. Electronically Signed   By: Marlaine Hind M.D.   On: 02/04/2019 15:45   DG FEMUR MIN 2 VIEWS LEFT  Result Date: 02/04/2019 CLINICAL DATA:  Pain status post fall EXAM: LEFT FEMUR 2 VIEWS COMPARISON:  None. FINDINGS: There is an acute impacted displaced fracture of the distal left femur superior to the knee prosthesis. There is an old healed fracture of the left femoral diaphysis. The patient is status post total knee arthroplasty. There is a suprapatellar joint effusion with evidence for lipohemarthrosis. IMPRESSION: Acute displaced impacted fracture of the distal left femur. Status post total knee arthroplasty. Old healed fracture of the distal left  femoral diaphysis. Electronically Signed   By: Constance Holster M.D.   On: 02/04/2019 18:37    EKG: Ordered and pending at this time.  Assessment/Plan Principal Problem:   Femur fracture (HCC) Active Problems:   Diabetes (HCC)   HTN (hypertension)   Leukocytosis   Alcohol use   Left closed periprosthetic distal femur fracture in the setting of previous femoral shaft nonunion -Orthopedics planning ORIF in am -Keep n.p.o. after midnight -Pain management: Fentanyl as needed, Norco as needed -Preop labs ordered and pending: BMP, hepatic function panel, coags, vitamin D level -Screening SARS-CoV-2 PCR test -Preop EKG pending  Leukocytosis Afebrile and no signs of sepsis.  WBC count 15.0.  Chest x-ray not suggestive of pneumonia.  Patient is endorsing UTI symptoms. -Check UA -Continue to monitor WBC count  Non-insulin-dependent type 2 diabetes -Check A1c.  Sliding scale insulin sensitive every 4 hours as patient will be n.p.o.  Hypertension -Hydralazine as needed for SBP >160  Chronic ulcerative colitis -No signs of acute exacerbation at this time  History of alcohol use disorder Patient reports prior history of heavy alcohol use several years ago but is currently not drinking.  No signs of acute withdrawal at this time. -CIWA monitoring; Ativan if needed  Resume home medications after pharmacy med rec is completed.  DVT prophylaxis: SCDs at this time Code Status: Full code Family Communication: No family available at this time. Disposition Plan: Anticipate discharge after clinical improvement. Consults called: Orthopedics Admission status: It is my clinical opinion that admission to INPATIENT is reasonable and necessary in this 59 y.o. female . presenting with Left closed periprosthetic distal femur fracture in the setting of previous femoral shaft nonunion.  Orthopedics planning ORIF in a.m.  Given the aforementioned, the predictability of an adverse outcome is felt to  be significant. I expect that the patient will require at least 2 midnights in the hospital to treat this condition.   The medical decision making on this patient was of high complexity and the patient is at high risk for clinical deterioration, therefore this is a level 3 visit.  Shela Leff MD [facility]  If 7PM-7AM, please contact night-coverage [email or web address] Password [facility]  02/04/2019, 11:41 PM

## 2019-02-04 NOTE — ED Triage Notes (Signed)
Pt arrived with [facility] as ED to ED transfer from [facility] for left femur fracture. Pt has had previous surgery to L leg. 18g present to R AC, purewick in place, A&Ox4. Knee sleeve in place. 44m total of morphine given pta without improvement of pain.   Covid test pending

## 2019-02-04 NOTE — Progress Notes (Signed)
I have discussed this case by phone with Dr. Altamese Mehama the orthopedic traumatologist at [facility].  He is agreed to take care of this patient and has accepted the patient for transfer tonight.  Patient should be n.p.o. after midnight in preparation for surgery in the morning.  Patient will be transferred ER to ER with hospitalist admission for preop clearance.  I have spoken with Betha Loa.PA in the [facility] regional emergency department who will assist in transferring the patient to [employer].

## 2019-02-04 NOTE — ED Triage Notes (Addendum)
Pt via EMS from home. Pt had a mechanical fall where she fell and tripped on the mud. Pt has a double knee replacement. Pt denies LOC and head injury. Pt only has pain in her L knee and denies pain anywhere else. EMS gave the pt 50 of fentanyl en route.
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
## 2019-02-05 ENCOUNTER — Inpatient Hospital Stay (HOSPITAL_COMMUNITY): Payer: Medicaid Other

## 2019-02-05 ENCOUNTER — Encounter (HOSPITAL_COMMUNITY)
Admission: EM | Disposition: A | Discharge status: Home Health | Payer: Self-pay | Source: Home / Self Care | Attending: Internal Medicine

## 2019-02-05 HISTORY — PX: ORIF FEMUR FRACTURE: SHX2119

## 2019-02-05 LAB — HEPATIC FUNCTION PANEL
ALT: 17 U/L (ref 0–44)
AST: 18 U/L (ref 15–41)
Albumin: 3.5 g/dL (ref 3.5–5.0)
Alkaline Phosphatase: 55 U/L (ref 38–126)
Bilirubin, Direct: 0.3 mg/dL — ABNORMAL HIGH (ref 0.0–0.2)
Indirect Bilirubin: 1.4 mg/dL — ABNORMAL HIGH (ref 0.3–0.9)
Total Bilirubin: 1.7 mg/dL — ABNORMAL HIGH (ref 0.3–1.2)
Total Protein: 6.4 g/dL — ABNORMAL LOW (ref 6.5–8.1)

## 2019-02-05 LAB — CBC
HCT: 38.2 % (ref 36.0–46.0)
HCT: 33.2 % — ABNORMAL LOW (ref 36.0–46.0)
Hemoglobin: 12.1 g/dL (ref 12.0–15.0)
Hemoglobin: 10.6 g/dL — ABNORMAL LOW (ref 12.0–15.0)
MCH: 25.9 pg — ABNORMAL LOW (ref 26.0–34.0)
MCH: 26.2 pg (ref 26.0–34.0)
MCHC: 31.7 g/dL (ref 30.0–36.0)
MCHC: 31.9 g/dL (ref 30.0–36.0)
MCV: 81.6 fL (ref 80.0–100.0)
MCV: 82.2 fL (ref 80.0–100.0)
Platelets: 252 10*3/uL (ref 150–400)
Platelets: 186 10*3/uL (ref 150–400)
RBC: 4.68 MIL/uL (ref 3.87–5.11)
RBC: 4.04 MIL/uL (ref 3.87–5.11)
RDW: 14 % (ref 11.5–15.5)
RDW: 13.9 % (ref 11.5–15.5)
WBC: 13.2 10*3/uL — ABNORMAL HIGH (ref 4.0–10.5)
WBC: 13.4 10*3/uL — ABNORMAL HIGH (ref 4.0–10.5)
nRBC: 0 % (ref 0.0–0.2)
nRBC: 0 % (ref 0.0–0.2)

## 2019-02-05 LAB — SARS CORONAVIRUS 2 (TAT 6-24 HRS): SARS Coronavirus 2: NEGATIVE

## 2019-02-05 LAB — URINALYSIS, ROUTINE W REFLEX MICROSCOPIC
Bacteria, UA: NONE SEEN
Bilirubin Urine: NEGATIVE
Glucose, UA: NEGATIVE mg/dL
Hgb urine dipstick: NEGATIVE
Ketones, ur: NEGATIVE mg/dL
Leukocytes,Ua: NEGATIVE
Nitrite: NEGATIVE
Protein, ur: NEGATIVE mg/dL
Specific Gravity, Urine: 1.03 (ref 1.005–1.030)
pH: 5 (ref 5.0–8.0)

## 2019-02-05 LAB — CREATININE, SERUM
Creatinine, Ser: 0.74 mg/dL (ref 0.44–1.00)
GFR calc Af Amer: >60 mL/min (ref >60)
GFR calc non Af Amer: >60 mL/min (ref >60)

## 2019-02-05 LAB — ABO/RH: ABO/RH(D): B POS

## 2019-02-05 LAB — GLUCOSE, CAPILLARY
Glucose-Capillary: 174 mg/dL — ABNORMAL HIGH (ref 70–99)
Glucose-Capillary: 140 mg/dL — ABNORMAL HIGH (ref 70–99)
Glucose-Capillary: 232 mg/dL — ABNORMAL HIGH (ref 70–99)
Glucose-Capillary: 286 mg/dL — ABNORMAL HIGH (ref 70–99)
Glucose-Capillary: 261 mg/dL — ABNORMAL HIGH (ref 70–99)

## 2019-02-05 LAB — TYPE AND SCREEN
ABO/RH(D): B POS
Antibody Screen: NEGATIVE

## 2019-02-05 LAB — BASIC METABOLIC PANEL
Anion gap: 13 (ref 5–15)
BUN: 9 mg/dL (ref 6–20)
CO2: 27 mmol/L (ref 22–32)
Calcium: 8.2 mg/dL — ABNORMAL LOW (ref 8.9–10.3)
Chloride: 97 mmol/L — ABNORMAL LOW (ref 98–111)
Creatinine, Ser: 0.72 mg/dL (ref 0.44–1.00)
GFR calc Af Amer: >60 mL/min (ref >60)
GFR calc non Af Amer: >60 mL/min (ref >60)
Glucose, Bld: 263 mg/dL — ABNORMAL HIGH (ref 70–99)
Potassium: 3.1 mmol/L — ABNORMAL LOW (ref 3.5–5.1)
Sodium: 137 mmol/L (ref 135–145)

## 2019-02-05 LAB — SURGICAL PCR SCREEN
MRSA, PCR: NEGATIVE
Staphylococcus aureus: NEGATIVE

## 2019-02-05 LAB — HIV ANTIBODY (ROUTINE TESTING W REFLEX): HIV Screen 4th Generation wRfx: NONREACTIVE

## 2019-02-05 SURGERY — OPEN REDUCTION INTERNAL FIXATION (ORIF) DISTAL FEMUR FRACTURE
Anesthesia: General | Laterality: Left

## 2019-02-05 MED ORDER — ONDANSETRON HCL 4 MG PO TABS
4.0000 mg | ORAL_TABLET | Freq: Four times a day (QID) | ORAL | Status: DC | PRN
  Administered 2019-02-07: 11:00:00 4 mg via ORAL
  Filled 2019-02-05: qty 1

## 2019-02-05 MED ORDER — DEXAMETHASONE SODIUM PHOSPHATE 10 MG/ML IJ SOLN
INTRAMUSCULAR | Status: DC | PRN
  Administered 2019-02-05: 4 mg via INTRAVENOUS

## 2019-02-05 MED ORDER — PROMETHAZINE HCL 25 MG/ML IJ SOLN
6.2500 mg | INTRAMUSCULAR | Status: DC | PRN
  Administered 2019-02-05: 12:00:00 6.25 mg via INTRAVENOUS

## 2019-02-05 MED ORDER — PROPOFOL 10 MG/ML IV BOLUS
INTRAVENOUS | Status: AC
  Filled 2019-02-05: qty 20

## 2019-02-05 MED ORDER — BRINZOLAMIDE 1 % OP SUSP
1.0000 [drp] | Freq: Two times a day (BID) | OPHTHALMIC | Status: DC
  Administered 2019-02-05 – 2019-02-07 (×4): 1 [drp] via OPHTHALMIC
  Filled 2019-02-05: qty 10

## 2019-02-05 MED ORDER — INSULIN GLARGINE 100 UNIT/ML ~~LOC~~ SOLN
7.0000 [IU] | Freq: Every day | SUBCUTANEOUS | Status: DC
  Administered 2019-02-05 – 2019-02-07 (×3): 7 [IU] via SUBCUTANEOUS
  Filled 2019-02-05 (×3): qty 0.07

## 2019-02-05 MED ORDER — LIDOCAINE 2% (20 MG/ML) 5 ML SYRINGE
INTRAMUSCULAR | Status: AC
  Filled 2019-02-05: qty 5

## 2019-02-05 MED ORDER — PHENYLEPHRINE 40 MCG/ML (10ML) SYRINGE FOR IV PUSH (FOR BLOOD PRESSURE SUPPORT)
PREFILLED_SYRINGE | INTRAVENOUS | Status: AC
  Filled 2019-02-05: qty 10

## 2019-02-05 MED ORDER — ONDANSETRON HCL 4 MG/2ML IJ SOLN: 4.0000 mg | Freq: Four times a day (QID) | INTRAMUSCULAR | Status: DC | PRN

## 2019-02-05 MED ORDER — POTASSIUM CHLORIDE IN NACL 20-0.9 MEQ/L-% IV SOLN
INTRAVENOUS | Status: DC
  Filled 2019-02-05: qty 1000

## 2019-02-05 MED ORDER — HYDROMORPHONE HCL 1 MG/ML IJ SOLN: 0.2500 mg | INTRAMUSCULAR | Status: DC | PRN

## 2019-02-05 MED ORDER — ENOXAPARIN SODIUM 40 MG/0.4ML ~~LOC~~ SOLN
40.0000 mg | SUBCUTANEOUS | Status: DC
  Administered 2019-02-06 – 2019-02-07 (×2): 40 mg via SUBCUTANEOUS
  Filled 2019-02-05 (×2): qty 0.4

## 2019-02-05 MED ORDER — SCOPOLAMINE 1 MG/3DAYS TD PT72
MEDICATED_PATCH | TRANSDERMAL | Status: AC
  Administered 2019-02-05: 1.5 mg via TRANSDERMAL
  Filled 2019-02-05: qty 1

## 2019-02-05 MED ORDER — ACETAMINOPHEN 500 MG PO TABS
500.0000 mg | ORAL_TABLET | Freq: Two times a day (BID) | ORAL | Status: DC
  Administered 2019-02-05 – 2019-02-07 (×5): 500 mg via ORAL
  Filled 2019-02-05 (×5): qty 1

## 2019-02-05 MED ORDER — MIDAZOLAM HCL 5 MG/5ML IJ SOLN
INTRAMUSCULAR | Status: DC | PRN
  Administered 2019-02-05: 2 mg via INTRAVENOUS

## 2019-02-05 MED ORDER — ACETAMINOPHEN 325 MG PO TABS
325.0000 mg | ORAL_TABLET | Freq: Four times a day (QID) | ORAL | Status: DC | PRN
  Filled 2019-02-05: qty 2

## 2019-02-05 MED ORDER — BUPIVACAINE HCL (PF) 0.25 % IJ SOLN
INTRAMUSCULAR | Status: AC
  Filled 2019-02-05: qty 30

## 2019-02-05 MED ORDER — MORPHINE SULFATE (PF) 2 MG/ML IV SOLN: 0.5000 mg | INTRAVENOUS | Status: DC | PRN

## 2019-02-05 MED ORDER — METOCLOPRAMIDE HCL 5 MG PO TABS: 5.0000 mg | ORAL_TABLET | Freq: Three times a day (TID) | ORAL | Status: DC | PRN

## 2019-02-05 MED ORDER — METOCLOPRAMIDE HCL 5 MG/ML IJ SOLN: 5.0000 mg | Freq: Three times a day (TID) | INTRAMUSCULAR | Status: DC | PRN

## 2019-02-05 MED ORDER — PHENOL 1.4 % MT LIQD: 1.0000 | OROMUCOSAL | Status: DC | PRN

## 2019-02-05 MED ORDER — ONDANSETRON HCL 4 MG/2ML IJ SOLN
INTRAMUSCULAR | Status: AC
  Filled 2019-02-05: qty 2

## 2019-02-05 MED ORDER — ALUM & MAG HYDROXIDE-SIMETH 200-200-20 MG/5ML PO SUSP: 30.0000 mL | Freq: Four times a day (QID) | ORAL | Status: DC | PRN

## 2019-02-05 MED ORDER — OXYCODONE-ACETAMINOPHEN 5-325 MG PO TABS
1.0000 | ORAL_TABLET | Freq: Four times a day (QID) | ORAL | Status: DC | PRN
  Administered 2019-02-05: 1 via ORAL
  Administered 2019-02-06 – 2019-02-07 (×5): 2 via ORAL
  Filled 2019-02-05 (×2): qty 2
  Filled 2019-02-05: qty 1
  Filled 2019-02-05 (×3): qty 2

## 2019-02-05 MED ORDER — METHOCARBAMOL 500 MG PO TABS
750.0000 mg | ORAL_TABLET | Freq: Three times a day (TID) | ORAL | Status: DC
  Administered 2019-02-05 – 2019-02-07 (×6): 750 mg via ORAL
  Filled 2019-02-05 (×6): qty 2

## 2019-02-05 MED ORDER — LIDOCAINE 2% (20 MG/ML) 5 ML SYRINGE
INTRAMUSCULAR | Status: DC | PRN
  Administered 2019-02-05: 40 mg via INTRAVENOUS

## 2019-02-05 MED ORDER — KETOROLAC TROMETHAMINE 30 MG/ML IJ SOLN
INTRAMUSCULAR | Status: AC
  Filled 2019-02-05: qty 1

## 2019-02-05 MED ORDER — SUGAMMADEX SODIUM 200 MG/2ML IV SOLN
INTRAVENOUS | Status: DC | PRN
  Administered 2019-02-05: 190 mg via INTRAVENOUS

## 2019-02-05 MED ORDER — MENTHOL 3 MG MT LOZG: 1.0000 | LOZENGE | OROMUCOSAL | Status: DC | PRN

## 2019-02-05 MED ORDER — PROMETHAZINE HCL 25 MG/ML IJ SOLN
INTRAMUSCULAR | Status: AC
  Filled 2019-02-05: qty 1

## 2019-02-05 MED ORDER — MIDAZOLAM HCL 2 MG/2ML IJ SOLN
INTRAMUSCULAR | Status: AC
  Filled 2019-02-05: qty 2

## 2019-02-05 MED ORDER — MESALAMINE 1.2 G PO TBEC
2.4000 g | DELAYED_RELEASE_TABLET | Freq: Every day | ORAL | Status: DC
  Administered 2019-02-06 – 2019-02-07 (×2): 2.4 g via ORAL
  Filled 2019-02-05 (×2): qty 2

## 2019-02-05 MED ORDER — POTASSIUM CHLORIDE CRYS ER 20 MEQ PO TBCR
40.0000 meq | EXTENDED_RELEASE_TABLET | Freq: Once | ORAL | Status: AC
  Administered 2019-02-05: 40 meq via ORAL
  Filled 2019-02-05: qty 2

## 2019-02-05 MED ORDER — KETOROLAC TROMETHAMINE 30 MG/ML IJ SOLN
30.0000 mg | Freq: Once | INTRAMUSCULAR | Status: AC | PRN
  Administered 2019-02-05: 12:00:00 30 mg via INTRAVENOUS

## 2019-02-05 MED ORDER — LACTATED RINGERS IV SOLN: INTRAVENOUS | Status: DC | PRN

## 2019-02-05 MED ORDER — ACETAMINOPHEN 500 MG PO TABS: 1000.0000 mg | ORAL_TABLET | Freq: Once | ORAL | Status: AC

## 2019-02-05 MED ORDER — PHENYLEPHRINE HCL-NACL 10-0.9 MG/250ML-% IV SOLN
INTRAVENOUS | Status: DC | PRN
  Administered 2019-02-05: 20 ug/min via INTRAVENOUS

## 2019-02-05 MED ORDER — PRAVASTATIN SODIUM 10 MG PO TABS
10.0000 mg | ORAL_TABLET | ORAL | Status: DC
  Administered 2019-02-06 – 2019-02-07 (×2): 10 mg via ORAL
  Filled 2019-02-05 (×2): qty 1

## 2019-02-05 MED ORDER — ACETAMINOPHEN 500 MG PO TABS
ORAL_TABLET | ORAL | Status: AC
  Administered 2019-02-05: 07:00:00 1000 mg via ORAL
  Filled 2019-02-05: qty 2

## 2019-02-05 MED ORDER — ALBUTEROL SULFATE (2.5 MG/3ML) 0.083% IN NEBU: 3.0000 mL | INHALATION_SOLUTION | RESPIRATORY_TRACT | Status: DC | PRN

## 2019-02-05 MED ORDER — ROCURONIUM BROMIDE 10 MG/ML (PF) SYRINGE
PREFILLED_SYRINGE | INTRAVENOUS | Status: DC | PRN
  Administered 2019-02-05 (×2): 50 mg via INTRAVENOUS

## 2019-02-05 MED ORDER — LATANOPROST 0.005 % OP SOLN
1.0000 [drp] | Freq: Every day | OPHTHALMIC | Status: DC
  Administered 2019-02-06 (×2): 1 [drp] via OPHTHALMIC
  Filled 2019-02-05 (×3): qty 2.5

## 2019-02-05 MED ORDER — METOPROLOL SUCCINATE ER 50 MG PO TB24
50.0000 mg | ORAL_TABLET | Freq: Every day | ORAL | Status: DC
  Administered 2019-02-05 – 2019-02-06 (×2): 50 mg via ORAL
  Filled 2019-02-05 (×2): qty 1

## 2019-02-05 MED ORDER — DEXAMETHASONE SODIUM PHOSPHATE 10 MG/ML IJ SOLN
INTRAMUSCULAR | Status: AC
  Filled 2019-02-05: qty 1

## 2019-02-05 MED ORDER — SCOPOLAMINE 1 MG/3DAYS TD PT72: 1.0000 | MEDICATED_PATCH | TRANSDERMAL | Status: DC

## 2019-02-05 MED ORDER — METHOCARBAMOL 1000 MG/10ML IJ SOLN
500.0000 mg | Freq: Three times a day (TID) | INTRAVENOUS | Status: DC
  Filled 2019-02-05 (×8): qty 5

## 2019-02-05 MED ORDER — FENTANYL CITRATE (PF) 250 MCG/5ML IJ SOLN
INTRAMUSCULAR | Status: DC | PRN
  Administered 2019-02-05: 100 ug via INTRAVENOUS
  Administered 2019-02-05 (×2): 50 ug via INTRAVENOUS
  Administered 2019-02-05 (×2): 25 ug via INTRAVENOUS

## 2019-02-05 MED ORDER — INSULIN ASPART 100 UNIT/ML ~~LOC~~ SOLN
SUBCUTANEOUS | Status: AC
  Filled 2019-02-05: qty 1

## 2019-02-05 MED ORDER — ALBUTEROL SULFATE HFA 108 (90 BASE) MCG/ACT IN AERS
INHALATION_SPRAY | RESPIRATORY_TRACT | Status: DC | PRN
  Administered 2019-02-05: 2 via RESPIRATORY_TRACT

## 2019-02-05 MED ORDER — 0.9 % SODIUM CHLORIDE (POUR BTL) OPTIME
TOPICAL | Status: DC | PRN
  Administered 2019-02-05: 1000 mL

## 2019-02-05 MED ORDER — GUAIFENESIN-DM 100-10 MG/5ML PO SYRP: 10.0000 mL | ORAL_SOLUTION | ORAL | Status: DC | PRN

## 2019-02-05 MED ORDER — ALBUTEROL SULFATE HFA 108 (90 BASE) MCG/ACT IN AERS
INHALATION_SPRAY | RESPIRATORY_TRACT | Status: AC
  Filled 2019-02-05: qty 6.7

## 2019-02-05 MED ORDER — POTASSIUM CHLORIDE CRYS ER 20 MEQ PO TBCR
40.0000 meq | EXTENDED_RELEASE_TABLET | Freq: Once | ORAL | Status: AC
  Administered 2019-02-05: 15:00:00 40 meq via ORAL
  Filled 2019-02-05: qty 2

## 2019-02-05 MED ORDER — MEPERIDINE HCL 25 MG/ML IJ SOLN: 6.2500 mg | INTRAMUSCULAR | Status: DC | PRN

## 2019-02-05 MED ORDER — ROCURONIUM BROMIDE 10 MG/ML (PF) SYRINGE
PREFILLED_SYRINGE | INTRAVENOUS | Status: AC
  Filled 2019-02-05: qty 10

## 2019-02-05 MED ORDER — OXYCODONE HCL 5 MG/5ML PO SOLN: 5.0000 mg | Freq: Once | ORAL | Status: DC | PRN

## 2019-02-05 MED ORDER — ALBUMIN HUMAN 5 % IV SOLN: INTRAVENOUS | Status: DC | PRN

## 2019-02-05 MED ORDER — ONDANSETRON HCL 4 MG/2ML IJ SOLN
INTRAMUSCULAR | Status: DC | PRN
  Administered 2019-02-05: 4 mg via INTRAVENOUS

## 2019-02-05 MED ORDER — FENTANYL CITRATE (PF) 250 MCG/5ML IJ SOLN
INTRAMUSCULAR | Status: AC
  Filled 2019-02-05: qty 5

## 2019-02-05 MED ORDER — SUCRALFATE 1 G PO TABS
1.0000 g | ORAL_TABLET | Freq: Two times a day (BID) | ORAL | Status: DC
  Administered 2019-02-05 – 2019-02-07 (×5): 1 g via ORAL
  Filled 2019-02-05 (×5): qty 1

## 2019-02-05 MED ORDER — DOCUSATE SODIUM 100 MG PO CAPS
100.0000 mg | ORAL_CAPSULE | Freq: Two times a day (BID) | ORAL | Status: DC
  Administered 2019-02-05 – 2019-02-07 (×4): 100 mg via ORAL
  Filled 2019-02-05 (×4): qty 1

## 2019-02-05 MED ORDER — ALLOPURINOL 100 MG PO TABS
100.0000 mg | ORAL_TABLET | Freq: Every day | ORAL | Status: DC
  Administered 2019-02-05 – 2019-02-07 (×3): 100 mg via ORAL
  Filled 2019-02-05 (×3): qty 1

## 2019-02-05 MED ORDER — GABAPENTIN 300 MG PO CAPS
300.0000 mg | ORAL_CAPSULE | Freq: Two times a day (BID) | ORAL | Status: DC
  Administered 2019-02-05 – 2019-02-07 (×5): 300 mg via ORAL
  Filled 2019-02-05 (×5): qty 1

## 2019-02-05 MED ORDER — OXYCODONE HCL 5 MG PO TABS: 5.0000 mg | ORAL_TABLET | Freq: Once | ORAL | Status: DC | PRN

## 2019-02-05 MED ORDER — CLINDAMYCIN PHOSPHATE 600 MG/50ML IV SOLN
600.0000 mg | Freq: Four times a day (QID) | INTRAVENOUS | Status: AC
  Administered 2019-02-05 – 2019-02-06 (×3): 600 mg via INTRAVENOUS
  Filled 2019-02-05 (×3): qty 50

## 2019-02-05 MED ORDER — ADULT MULTIVITAMIN W/MINERALS CH
1.0000 | ORAL_TABLET | Freq: Every day | ORAL | Status: DC
  Administered 2019-02-05 – 2019-02-07 (×3): 1 via ORAL
  Filled 2019-02-05 (×3): qty 1

## 2019-02-05 MED ORDER — PROPOFOL 10 MG/ML IV BOLUS
INTRAVENOUS | Status: DC | PRN
  Administered 2019-02-05: 150 mg via INTRAVENOUS

## 2019-02-05 SURGICAL SUPPLY — 81 items
BIT DRILL 4.3 (BIT) ×2
BIT DRILL 4.3MM (BIT) ×1
BIT DRILL 4.3X300MM (BIT) ×1 IMPLANT
BIT DRILL LONG 3.3 (BIT) ×2 IMPLANT
BIT DRILL LONG 3.3MM (BIT) ×1
BIT DRILL QC 3.3X195 (BIT) ×3 IMPLANT
BLADE CLIPPER SURG (BLADE) IMPLANT
BNDG ELASTIC 4X5.8 VLCR STR LF (GAUZE/BANDAGES/DRESSINGS) ×3 IMPLANT
BNDG ELASTIC 6X10 VLCR STRL LF (GAUZE/BANDAGES/DRESSINGS) ×3 IMPLANT
BNDG ELASTIC 6X5.8 VLCR STR LF (GAUZE/BANDAGES/DRESSINGS) ×3 IMPLANT
BNDG GAUZE ELAST 4 BULKY (GAUZE/BANDAGES/DRESSINGS) ×6 IMPLANT
BRUSH SCRUB EZ PLAIN DRY (MISCELLANEOUS) ×6 IMPLANT
CANISTER SUCT 3000ML PPV (MISCELLANEOUS) ×3 IMPLANT
CAP LOCK NCB (Cap) ×21 IMPLANT
COVER SURGICAL LIGHT HANDLE (MISCELLANEOUS) ×3 IMPLANT
COVER WAND RF STERILE (DRAPES) ×3 IMPLANT
DRAPE C-ARM 42X72 X-RAY (DRAPES) ×3 IMPLANT
DRAPE C-ARMOR (DRAPES) ×3 IMPLANT
DRAPE IMP U-DRAPE 54X76 (DRAPES) ×3 IMPLANT
DRAPE ORTHO SPLIT 77X108 STRL (DRAPES) ×6
DRAPE SURG ORHT 6 SPLT 77X108 (DRAPES) ×3 IMPLANT
DRAPE U-SHAPE 47X51 STRL (DRAPES) ×3 IMPLANT
DRSG ADAPTIC 3X8 NADH LF (GAUZE/BANDAGES/DRESSINGS) ×3 IMPLANT
DRSG PAD ABDOMINAL 8X10 ST (GAUZE/BANDAGES/DRESSINGS) ×12 IMPLANT
ELECT REM PT RETURN 9FT ADLT (ELECTROSURGICAL) ×3
ELECTRODE REM PT RTRN 9FT ADLT (ELECTROSURGICAL) ×1 IMPLANT
EVACUATOR 1/8 PVC DRAIN (DRAIN) IMPLANT
EVACUATOR 3/16  PVC DRAIN (DRAIN)
EVACUATOR 3/16 PVC DRAIN (DRAIN) IMPLANT
GAUZE SPONGE 4X4 12PLY STRL (GAUZE/BANDAGES/DRESSINGS) ×3 IMPLANT
GLOVE BIO SURGEON STRL SZ7.5 (GLOVE) ×3 IMPLANT
GLOVE BIO SURGEON STRL SZ8 (GLOVE) ×3 IMPLANT
GLOVE BIOGEL PI IND STRL 7.5 (GLOVE) ×1 IMPLANT
GLOVE BIOGEL PI IND STRL 8 (GLOVE) ×1 IMPLANT
GLOVE BIOGEL PI INDICATOR 7.5 (GLOVE) ×2
GLOVE BIOGEL PI INDICATOR 8 (GLOVE) ×2
GOWN STRL REUS W/ TWL LRG LVL3 (GOWN DISPOSABLE) ×2 IMPLANT
GOWN STRL REUS W/ TWL XL LVL3 (GOWN DISPOSABLE) ×1 IMPLANT
GOWN STRL REUS W/TWL LRG LVL3 (GOWN DISPOSABLE) ×4
GOWN STRL REUS W/TWL XL LVL3 (GOWN DISPOSABLE) ×2
K-WIRE 2.0 (WIRE) ×8
K-WIRE FXSTD 280X2XNS SS (WIRE) ×4
KIT BASIN OR (CUSTOM PROCEDURE TRAY) ×3 IMPLANT
KIT TURNOVER KIT B (KITS) ×3 IMPLANT
KWIRE FXSTD 280X2XNS SS (WIRE) ×4 IMPLANT
NEEDLE 22X1 1/2 (OR ONLY) (NEEDLE) IMPLANT
NS IRRIG 1000ML POUR BTL (IV SOLUTION) ×3 IMPLANT
PACK TOTAL JOINT (CUSTOM PROCEDURE TRAY) ×3 IMPLANT
PACK UNIVERSAL I (CUSTOM PROCEDURE TRAY) ×3 IMPLANT
PAD ARMBOARD 7.5X6 YLW CONV (MISCELLANEOUS) ×6 IMPLANT
PAD CAST 4YDX4 CTTN HI CHSV (CAST SUPPLIES) ×1 IMPLANT
PADDING CAST COTTON 4X4 STRL (CAST SUPPLIES) ×2
PADDING CAST COTTON 6X4 STRL (CAST SUPPLIES) ×3 IMPLANT
PLATE DIST FEM 12H (Plate) ×3 IMPLANT
SCREW 5.0 70MM (Screw) ×3 IMPLANT
SCREW NCB 3.5X75X5X6.2XST (Screw) ×2 IMPLANT
SCREW NCB 4.0MX34M (Screw) ×6 IMPLANT
SCREW NCB 4.0MX38M (Screw) ×9 IMPLANT
SCREW NCB 4.0MX42M (Screw) ×3 IMPLANT
SCREW NCB 4.0MX46M (Screw) ×3 IMPLANT
SCREW NCB 5.0MMX24 (Screw) ×3 IMPLANT
SCREW NCB 5.0X26MM (Screw) ×3 IMPLANT
SCREW NCB 5.0X75MM (Screw) ×4 IMPLANT
SPONGE GAUZE 2X2 8PLY STER LF (GAUZE/BANDAGES/DRESSINGS) ×1
SPONGE GAUZE 2X2 8PLY STRL LF (GAUZE/BANDAGES/DRESSINGS) ×2 IMPLANT
SPONGE LAP 18X18 RF (DISPOSABLE) ×3 IMPLANT
STAPLER VISISTAT 35W (STAPLE) ×3 IMPLANT
SUCTION FRAZIER HANDLE 10FR (MISCELLANEOUS) ×2
SUCTION TUBE FRAZIER 10FR DISP (MISCELLANEOUS) ×1 IMPLANT
SUT PROLENE 0 CT 2 (SUTURE) IMPLANT
SUT VIC AB 0 CT1 27 (SUTURE) ×4
SUT VIC AB 0 CT1 27XBRD ANBCTR (SUTURE) ×2 IMPLANT
SUT VIC AB 1 CT1 27 (SUTURE) ×4
SUT VIC AB 1 CT1 27XBRD ANBCTR (SUTURE) ×2 IMPLANT
SUT VIC AB 2-0 CT1 27 (SUTURE) ×4
SUT VIC AB 2-0 CT1 TAPERPNT 27 (SUTURE) ×2 IMPLANT
SYR 20ML ECCENTRIC (SYRINGE) IMPLANT
TOWEL GREEN STERILE (TOWEL DISPOSABLE) ×6 IMPLANT
TOWEL GREEN STERILE FF (TOWEL DISPOSABLE) ×3 IMPLANT
TRAY FOLEY MTR SLVR 16FR STAT (SET/KITS/TRAYS/PACK) IMPLANT
WATER STERILE IRR 1000ML POUR (IV SOLUTION) ×6 IMPLANT

## 2019-02-05 NOTE — Anesthesia Postprocedure Evaluation (Signed)
Anesthesia Post Note  Patient: Sheila [name]  Procedure(s) Performed: OPEN REDUCTION INTERNAL FIXATION (ORIF) DISTAL FEMUR FRACTURE (Left )     Patient location during evaluation: PACU Anesthesia Type: General Level of consciousness: awake and alert, oriented and patient cooperative Pain management: pain level controlled Vital Signs Assessment: post-procedure vital signs reviewed and stable Respiratory status: spontaneous breathing, nonlabored ventilation and respiratory function stable Cardiovascular status: blood pressure returned to baseline and stable Postop Assessment: no apparent nausea or vomiting Anesthetic complications: no    Last Vitals:  Vitals:   02/05/19 1200 02/05/19 1215  BP: 128/73 132/69  Pulse: 94 96  Resp: 15 12  Temp:  36.6 C  SpO2: 94% 95%    Last Pain:  Vitals:   02/05/19 1215  TempSrc:   PainSc: 0-No pain                 Pervis Hocking

## 2019-02-05 NOTE — Progress Notes (Signed)
PROGRESS NOTE    Sheila [name]  NWG:[id number] DOB: 1960/12/11 DOA: 02/04/2019 PCP: Ellamae Sia, MD   Brief Narrative: 59 year old with past medical history significant for anemia, arthritis, asthma, COPD, lumbar DDD, noninsulin-dependent diabetes type 2, hypertension, neuromuscular disorder, ulcerative colitis, alcohol use disorder who presents with complaint of left knee pain after mechanical fall.  Imaging revealed left closed peri-prosthetic distal femur fracture  in the setting of previous femoral shaft nonunion. Patient underwent open reduction internal fixation distal femoral fracture on 02/05/2019.   Assessment & Plan:   Principal Problem:   Femur fracture ([place]) Active Problems:   Diabetes ([place])   HTN (hypertension)   Leukocytosis   Alcohol use  1-Left closed periprostatic distal femoral fracture in the setting of previous femoral shaft nonunion: Continue with fentanyl and Norco as needed for pain control. Patient underwent open reduction internal fixation distal femoral fracture on 02/05/2019. PT evaluation none DVT prophylaxis per orthopedic.  2-Leukocytosis: Chest x-ray negative. UA with 6-10 white blood cell.  Will check urine culture.   Follow urine culture. Trending down.  3-Diabetes type 2: We will start low-dose Lantus.  Continue with a sliding scale insulin.  Hold Metformin while inpatient.  4-HTN;  Continue with Hydralazine PRN.  Hold diuretics. Continue with metoprolol.   Chronic Ulcerative Colitis;  No acute exacerbation.  Resume mesalamine.   History of alcohol use disorder;  Denies drinking Alcohol.  On CIWA  Hypokalemia: Replete orally. Asthma: Continue with albuterol.  Estimated body mass index is 38.08 kg/m as calculated from the following:   Height as of an earlier encounter on 02/04/19: 5' (1.524 m).   Weight as of an earlier encounter on 02/04/19: 88.5 kg.   DVT prophylaxis: Lovenox Code Status: Full code Family Communication:  Care discussed with patient Disposition Plan:  Patient is from: Home Anticipated d/c date: 2 to 3 days Barriers to d/c or necessity for inpatient status: Patient post surgery today.  She will need to be evaluated by physical therapy and cleared by orthopedic to be discharged  Consultants:   Ortho  Procedures:  Patient underwent open reduction internal fixation distal femoral fracture on 02/05/2019.  Antimicrobials:  Received a dose of clindamycin.   Subjective: Patient just came from surgery.  She denies any pain.  She denies shortness of breath  Objective: Vitals:   02/05/19 1145 02/05/19 1200 02/05/19 1215 02/05/19 1310  BP: 131/68 128/73 132/69 (!) 110/50  Pulse: (!) 102 94 96 96  Resp: 13 15 12 17   Temp: 97.7 F (36.5 C)  97.8 F (36.6 C) 98 F (36.7 C)  TempSrc:    Oral  SpO2: 97% 94% 95% 92%    Intake/Output Summary (Last 24 hours) at 02/05/2019 1427 Last data filed at 02/05/2019 1047 Gross per 24 hour  Intake 1225.84 ml  Output 650 ml  Net 575.84 ml   There were no vitals filed for this visit.  Examination:  General exam: Appears calm and comfortable  Respiratory system: Clear to auscultation. Respiratory effort normal. Cardiovascular system: S1 & S2 heard, RRR. No JVD, murmurs, rubs, gallops or clicks. No pedal edema. Gastrointestinal system: Abdomen is nondistended, soft and nontender. No organomegaly or masses felt. Normal bowel sounds heard. Central nervous system: Alert and oriented. No focal neurological deficits. Extremities: Left lower extremity with dressing on knee immobilizer Skin: No rashes, lesions or ulcers     Data Reviewed: I have personally reviewed following labs and imaging studies  CBC: Recent Labs  Lab 02/04/19  2254 02/05/19 0148 02/05/19 1315  WBC 15.0* 13.2* 13.4*  NEUTROABS 10.9*  --   --   HGB 13.0 12.1 10.6*  HCT 41.0 38.2 33.2*  MCV 82.7 81.6 82.2  PLT 277 252 408   Basic Metabolic Panel: Recent Labs  Lab 02/04/19  2254 02/05/19 1315  NA 136 137  K 3.1* 3.1*  CL 102 97*  CO2 25 27  GLUCOSE 152* 263*  BUN 10 9  CREATININE 0.62 0.74  0.72  CALCIUM 8.1* 8.2*   GFR: Estimated Creatinine Clearance: 76.8 mL/min (by C-G formula based on SCr of 0.72 mg/dL). Liver Function Tests: Recent Labs  Lab 02/05/19 0148  AST 18  ALT 17  ALKPHOS 55  BILITOT 1.7*  PROT 6.4*  ALBUMIN 3.5   No results for input(s): LIPASE, AMYLASE in the last 168 hours. No results for input(s): AMMONIA in the last 168 hours. Coagulation Profile: Recent Labs  Lab 02/04/19 2254  INR 1.0   Cardiac Enzymes: No results for input(s): CKTOTAL, CKMB, CKMBINDEX, TROPONINI in the last 168 hours. BNP (last 3 results) No results for input(s): PROBNP in the last 8760 hours. HbA1C: Recent Labs    02/04/19 2254  HGBA1C 6.4*   CBG: Recent Labs  Lab 02/05/19 0409 02/05/19 0716 02/05/19 1143  GLUCAP 174* 140* 232*   Lipid Profile: No results for input(s): CHOL, HDL, LDLCALC, TRIG, CHOLHDL, LDLDIRECT in the last 72 hours. Thyroid Function Tests: No results for input(s): TSH, T4TOTAL, FREET4, T3FREE, THYROIDAB in the last 72 hours. Anemia Panel: No results for input(s): VITAMINB12, FOLATE, FERRITIN, TIBC, IRON, RETICCTPCT in the last 72 hours. Sepsis Labs: No results for input(s): PROCALCITON, LATICACIDVEN in the last 168 hours.  Recent Results (from the past 240 hour(s))  SARS CORONAVIRUS 2 (TAT 6-24 HRS) Nasopharyngeal Nasopharyngeal Swab     Status: None   Collection Time: 02/04/19  8:17 PM   Specimen: Nasopharyngeal Swab  Result Value Ref Range Status   SARS Coronavirus 2 NEGATIVE NEGATIVE Final    Comment: (NOTE) SARS-CoV-2 target nucleic acids are NOT DETECTED. The SARS-CoV-2 RNA is generally detectable in upper and lower respiratory specimens during the acute phase of infection. Negative results do not preclude SARS-CoV-2 infection, do not rule out co-infections with other pathogens, and should not be used  as the sole basis for treatment or other patient management decisions. Negative results must be combined with clinical observations, patient history, and epidemiological information. The expected result is Negative. Fact Sheet for Patients: SugarRoll.be Fact Sheet for Healthcare Providers: https://www.woods-mathews.com/ This test is not yet approved or cleared by the [place] FDA and  has been authorized for detection and/or diagnosis of SARS-CoV-2 by FDA under an Emergency Use Authorization (EUA). This EUA will remain  in effect (meaning this test can be used) for the duration of the COVID-19 declaration under Section 56 4(b)(1) of the Act, 21 U.S.C. section 360bbb-3(b)(1), unless the authorization is terminated or revoked sooner. Performed at [facility], [place] [address] [place], [place] [address]   Surgical pcr screen     Status: None   Collection Time: 02/05/19  1:55 AM   Specimen: Nasal Mucosa; Nasal Swab  Result Value Ref Range Status   MRSA, PCR NEGATIVE NEGATIVE Final   Staphylococcus aureus NEGATIVE NEGATIVE Final    Comment: (NOTE) The Xpert SA Assay (FDA approved for NASAL specimens in patients 21 years of age and older), is one component of a comprehensive surveillance program. It is not intended to diagnose infection  nor to guide or monitor treatment. Performed at [facility], [place] [address] [place], [place] [address]          Radiology Studies: Chest Portable 1 View  Result Date: 02/04/2019 CLINICAL DATA:  Initial evaluation for acute trauma, fall. EXAM: PORTABLE CHEST 1 VIEW COMPARISON:  Prior radiograph from 01/29/2018. FINDINGS: Cardiac and mediastinal silhouettes are stable in size and contour, and remain within normal limits. Lungs well inflated. No focal infiltrates. No edema or effusion. No pneumothorax. Remotely healed left clavicular fracture noted. Few additional remotely healed left-sided  rib fractures. No acute osseous abnormality. Visualized soft tissues demonstrate no acute finding. IMPRESSION: 1. No active cardiopulmonary disease. 2. Remotely healed left clavicular and left-sided rib fractures, stable. No acute osseous abnormality. Electronically Signed   By: Jeannine Boga M.D.   On: 02/04/2019 19:50   DG Knee Complete 4 Views Left  Result Date: 02/04/2019 CLINICAL DATA:  Fall. Left knee pain and deformity. Previous knee replacement. Initial encounter. EXAM: LEFT KNEE - COMPLETE 4+ VIEW COMPARISON:  04/19/2006 FINDINGS: Total knee arthroplasty is seen. Acute comminuted fracture is seen through the distal femoral metaphysis, just above the femoral prosthetic component. Medial and posterior displacement of the distal fracture fragment is seen. Old fracture deformity of distal femoral diaphysis is also noted. IMPRESSION: Acute comminuted fracture of the distal femoral metaphysis, just above the femoral component of knee prosthesis. Electronically Signed   By: Marlaine Hind M.D.   On: 02/04/2019 15:45   DG C-Arm 1-60 Min  Result Date: 02/05/2019 CLINICAL DATA:  ORIF left femur EXAM: DG C-ARM 1-60 MIN; LEFT FEMUR 2 VIEWS FLUOROSCOPY TIME:  Fluoroscopy Time:  1 minutes 17 seconds COMPARISON:  None. FINDINGS: Multiple intraoperative fluoroscopic spot images are provided. Interval ORIF of the left distal femur. IMPRESSION: Intraoperative localization. Electronically Signed   By: Kathreen Devoid   On: 02/05/2019 11:36   DG FEMUR MIN 2 VIEWS LEFT  Result Date: 02/05/2019 CLINICAL DATA:  ORIF left femur EXAM: DG C-ARM 1-60 MIN; LEFT FEMUR 2 VIEWS FLUOROSCOPY TIME:  Fluoroscopy Time:  1 minutes 17 seconds COMPARISON:  None. FINDINGS: Multiple intraoperative fluoroscopic spot images are provided. Interval ORIF of the left distal femur. IMPRESSION: Intraoperative localization. Electronically Signed   By: Kathreen Devoid   On: 02/05/2019 11:36   DG FEMUR MIN 2 VIEWS LEFT  Result Date: 02/04/2019  CLINICAL DATA:  Pain status post fall EXAM: LEFT FEMUR 2 VIEWS COMPARISON:  None. FINDINGS: There is an acute impacted displaced fracture of the distal left femur superior to the knee prosthesis. There is an old healed fracture of the left femoral diaphysis. The patient is status post total knee arthroplasty. There is a suprapatellar joint effusion with evidence for lipohemarthrosis. IMPRESSION: Acute displaced impacted fracture of the distal left femur. Status post total knee arthroplasty. Old healed fracture of the distal left femoral diaphysis. Electronically Signed   By: Constance Holster M.D.   On: 02/04/2019 18:37        Scheduled Meds: . acetaminophen  500 mg Oral Q12H  . docusate sodium  100 mg Oral BID  . [START ON 02/06/2019] enoxaparin (LOVENOX) injection  40 mg Subcutaneous Q24H  . insulin aspart  0-9 Units Subcutaneous Q4H  . methocarbamol  750 mg Oral TID  . povidone-iodine  2 application Topical Once   Continuous Infusions: . 0.9 % NaCl with KCl 20 mEq / L 50 mL/hr at 02/05/19 1335  . clindamycin (CLEOCIN) IV    . lactated  ringers 75 mL/hr at 02/05/19 1222  . methocarbamol (ROBAXIN) IV       LOS: 1 day    Time spent: 35 minutes     A , MD [facility]   If 7PM-7AM, please contact night-coverage [email or web address]  02/05/2019, 2:27 PM

## 2019-02-05 NOTE — Consult Note (Addendum)
Orthopaedic Trauma Service (OTS) Consult   Patient ID: Sheila [name] MRN: [id number] DOB/AGE: 1960/10/14 59 y.o.   Reason for Consult: Left distal femur fracture, periprosthetic with antecedent distal femur deformity Referring Physician: Thornton Park, MD, and Sheila Leff MD  HPI: Sheila [name] is an 59 y.o. female s/p bilateral knee replacements who fell taking out the trash. Given her complex medical history and pre-existing femoral shaft deformity, Dr. Mack Guise in [place] asserted this was outside his scope of practice and should be managed by a fellowship trained Orthopaedic Traumatologist. Consequently, I agreed to accept the patient in transfer. She denies other injury, stopped alcohol abuse three years ago, and reports pain as dull, aching, exacerbated by movement and alleviated with rest since fracture. No break in the skin.  Past Medical History:  Diagnosis Date  . Anemia   . Arthritis   . Asthma   . COPD (chronic obstructive pulmonary disease) ([place])   . DDD (degenerative disc disease), lumbar   . Diabetes mellitus without complication ([place])    on metformin  . DJD (degenerative joint disease)   . Dysrhythmia   . Fatty liver   . Fibroids   . GERD (gastroesophageal reflux disease)   . Glaucoma   . Hyperlipidemia   . Hypertension   . IBS (irritable bowel syndrome)   . Lumbago   . Neuromuscular disorder ([place])   . 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[place]) 85/0277  . Renal disorder   . Ulcerative colitis ([place])    on mesalamine    Past Surgical History:  Procedure Laterality Date  . COLONOSCOPY WITH PROPOFOL N/A 08/19/2015   Procedure: COLONOSCOPY WITH PROPOFOL;  Surgeon: Lollie Sails, MD;  Location: [facility] ENDOSCOPY;  Service: Endoscopy;  Laterality: N/A;  . COLONOSCOPY WITH PROPOFOL N/A 12/08/2015   Procedure: COLONOSCOPY WITH PROPOFOL;  Surgeon: Lollie Sails, MD;  Location: [facility] ENDOSCOPY;  Service: Endoscopy;  Laterality: N/A;  . COLONOSCOPY  WITH PROPOFOL N/A 12/09/2015   Procedure: COLONOSCOPY WITH PROPOFOL;  Surgeon: Lollie Sails, MD;  Location: [facility] ENDOSCOPY;  Service: Endoscopy;  Laterality: N/A;  . ESOPHAGOGASTRODUODENOSCOPY (EGD) WITH PROPOFOL N/A 11/08/2017   Procedure: ESOPHAGOGASTRODUODENOSCOPY (EGD) WITH PROPOFOL;  Surgeon: Lollie Sails, MD;  Location: [facility] ENDOSCOPY;  Service: Endoscopy;  Laterality: N/A;  . JOINT REPLACEMENT Left 04/19/2006  . JOINT REPLACEMENT Right 01/14/2015  . TOTAL KNEE ARTHROPLASTY Left 2008  . TOTAL KNEE ARTHROPLASTY Right 01/14/2015   Procedure: TOTAL KNEE ARTHROPLASTY;  Surgeon: Hessie Knows, MD;  Location: [facility] ORS;  Service: Orthopedics;  Laterality: Right;    Family History  Problem Relation Age of Onset  . Hypertension Mother   . Diabetes Father   . Hypertension Father   . Diabetes Brother   . Hypertension Brother   . Breast cancer Neg Hx     Social History:  reports that she quit smoking about 41 years ago. She has never used smokeless tobacco. She reports current alcohol use. She reports that she does not use drugs.  Allergies:  Allergies  Allergen Reactions  . Ampicillin Anaphylaxis  . Erythromycin Anaphylaxis  . Omeprazole Anaphylaxis  . Penicillins Anaphylaxis and Other (See Comments)    Has patient had a PCN reaction causing immediate rash, facial/tongue/throat swelling, SOB or lightheadedness with hypotension: Yes Has patient had a PCN reaction causing severe rash involving mucus membranes or skin necrosis: No Has patient had a PCN reaction that required hospitalization: Unknown Has patient had a PCN reaction occurring within the last  10 years: Yes If all of the above answers are "NO", then may proceed with Cephalosporin use.   . Triamterene-Hctz Shortness Of Breath and Swelling  . Quinapril Cough    Medications:  Prior to Admission:  Medications Prior to Admission  Medication Sig Dispense Refill Last Dose  . albuterol (PROVENTIL HFA;VENTOLIN HFA) 108  (90 BASE) MCG/ACT inhaler Inhale 2 puffs into the lungs every 4 (four) hours as needed for wheezing or shortness of breath.   Past Week at Unknown time  . allopurinol (ZYLOPRIM) 100 MG tablet Take 100 mg by mouth daily.  3 02/04/2019 at Unknown time  . bimatoprost (LUMIGAN) 0.03 % ophthalmic solution Place 1 drop into both eyes at bedtime.   Past Week at Unknown time  . brinzolamide (AZOPT) 1 % ophthalmic suspension Place 1 drop into both eyes 2 (two) times daily.   Past Week at Unknown time  . chlorthalidone (HYGROTON) 25 MG tablet Take 25 mg by mouth daily.   3 02/04/2019 at Unknown time  . CVS VITAMIN B12 1000 MCG tablet Take 1,000 mcg by mouth daily.  3 Past Week at Unknown time  . folic acid (FOLVITE) 1 MG tablet Take 1 tablet (1 mg total) by mouth daily.   02/04/2019 at Unknown time  . gabapentin (NEURONTIN) 300 MG capsule Take 1 capsule (300 mg total) by mouth 3 (three) times daily. (Patient taking differently: Take 300 mg by mouth 2 (two) times daily. ) 90 capsule 0 02/04/2019 at Unknown time  . KLOR-CON M20 20 MEQ tablet Take 2 tablets (40 mEq total) by mouth daily. 2 tablet 0 02/04/2019 at Unknown time  . linagliptin (TRADJENTA) 5 MG TABS tablet Take 1 tablet (5 mg total) by mouth daily. 30 tablet 0 02/04/2019 at Unknown time  . losartan (COZAAR) 25 MG tablet Take 25 mg by mouth daily.    02/04/2019 at Unknown time  . mesalamine (LIALDA) 1.2 g EC tablet Take 2.4 g by mouth daily with breakfast.   02/04/2019 at Unknown time  . metFORMIN (GLUCOPHAGE-XR) 500 MG 24 hr tablet Take 500 mg by mouth 2 (two) times daily.   02/04/2019 at Unknown time  . Multiple Vitamin (MULTIVITAMIN WITH MINERALS) TABS tablet Take 1 tablet by mouth daily.   02/04/2019 at Unknown time  . pantoprazole (PROTONIX) 40 MG tablet Take 40 mg by mouth every morning.   02/04/2019 at Unknown time  . pravastatin (PRAVACHOL) 10 MG tablet Take 10 mg by mouth every morning.    02/04/2019 at Unknown time  . sucralfate (CARAFATE) 1 g tablet  Take 1 g by mouth 2 (two) times daily.   02/04/2019 at am dose   . thiamine 100 MG tablet Take 1 tablet (100 mg total) by mouth daily.   02/04/2019 at Unknown time  . metoprolol succinate (TOPROL-XL) 50 MG 24 hr tablet Take 50 mg by mouth at bedtime.   02/03/2019 at bedtime    Results for orders placed or performed during the hospital encounter of 02/04/19 (from the past 48 hour(s))  CBC with Differential     Status: Abnormal   Collection Time: 02/04/19 10:54 PM  Result Value Ref Range   WBC 15.0 (H) 4.0 - 10.5 K/uL   RBC 4.96 3.87 - 5.11 MIL/uL   Hemoglobin 13.0 12.0 - 15.0 g/dL   HCT 41.0 36.0 - 46.0 %   MCV 82.7 80.0 - 100.0 fL   MCH 26.2 26.0 - 34.0 pg   MCHC 31.7 30.0 - 36.0 g/dL  RDW 13.9 11.5 - 15.5 %   Platelets 277 150 - 400 K/uL   nRBC 0.0 0.0 - 0.2 %   Neutrophils Relative % 72 %   Neutro Abs 10.9 (H) 1.7 - 7.7 K/uL   Lymphocytes Relative 17 %   Lymphs Abs 2.5 0.7 - 4.0 K/uL   Monocytes Relative 10 %   Monocytes Absolute 1.5 (H) 0.1 - 1.0 K/uL   Eosinophils Relative 0 %   Eosinophils Absolute 0.0 0.0 - 0.5 K/uL   Basophils Relative 0 %   Basophils Absolute 0.1 0.0 - 0.1 K/uL   Immature Granulocytes 1 %   Abs Immature Granulocytes 0.09 (H) 0.00 - 0.07 K/uL    Comment: Performed at [facility] [address] [place], [place] [address]  Basic metabolic panel     Status: Abnormal   Collection Time: 02/04/19 10:54 PM  Result Value Ref Range   Sodium 136 135 - 145 mmol/L   Potassium 3.1 (L) 3.5 - 5.1 mmol/L   Chloride 102 98 - 111 mmol/L   CO2 25 22 - 32 mmol/L   Glucose, Bld 152 (H) 70 - 99 mg/dL   BUN 10 6 - 20 mg/dL   Creatinine, Ser 0.62 0.44 - 1.00 mg/dL   Calcium 8.1 (L) 8.9 - 10.3 mg/dL   GFR calc non Af Amer >60 >60 mL/min   GFR calc Af Amer >60 >60 mL/min   Anion gap 9 5 - 15    Comment: Performed at [facility] [address], [place], [place] [address]  Hemoglobin A1c     Status: Abnormal   Collection Time: 02/04/19 10:54 PM  Result  Value Ref Range   Hgb A1c MFr Bld 6.4 (H) 4.8 - 5.6 %    Comment: (NOTE) Pre diabetes:          5.7%-6.4% Diabetes:              >6.4% Glycemic control for   <7.0% adults with diabetes    Mean Plasma Glucose 136.98 mg/dL    Comment: Performed at [facility] [address] [place], [place] [address]  Protime-INR     Status: None   Collection Time: 02/04/19 10:54 PM  Result Value Ref Range   Prothrombin Time 13.0 11.4 - 15.2 seconds   INR 1.0 0.8 - 1.2    Comment: (NOTE) INR goal varies based on device and disease states. Performed at [facility], [place] [address] [place], [place] [address]   VITAMIN D 25 Hydroxy (Vit-D Deficiency, Fractures)     Status: None   Collection Time: 02/04/19 10:54 PM  Result Value Ref Range   Vit D, 25-Hydroxy 32.65 30 - 100 ng/mL    Comment: (NOTE) Vitamin D deficiency has been defined by the [place] practice guideline as a level of serum 25-OH  vitamin D less than 20 ng/mL (1,2). The Endocrine Society went on to  further define vitamin D insufficiency as a level between 21 and 29  ng/mL (2). 1. IOM (Institute of Medicine). 2010. Dietary reference intakes for  calcium and D. [place]: The [employer]. 2. Holick MF, Binkley [place], Bischoff-Ferrari HA, et al. Evaluation,  treatment, and prevention of vitamin D deficiency: an Endocrine  Society clinical practice guideline, JCEM. 2011 Jul; 96(7): 1911-30. Performed at [facility], [place] [address] [place], [place] [address]   Type and screen [place]  Status: None   Collection Time: 02/04/19 10:56 PM  Result Value Ref Range   ABO/RH(D) B POS    Antibody Screen NEG    Sample Expiration      02/07/2019,2359 Performed at [facility], [place] [address] [place], [place] [address]   ABO/Rh     Status: None   Collection Time: 02/04/19 10:56 PM  Result Value Ref Range   ABO/RH(D)      B  POS Performed at [facility] [address], [place], [place] [address]   Urinalysis, Routine w reflex microscopic     Status: Abnormal   Collection Time: 02/05/19 12:52 AM  Result Value Ref Range   Color, Urine YELLOW YELLOW   APPearance TURBID (A) CLEAR   Specific Gravity, Urine 1.030 1.005 - 1.030   pH 5.0 5.0 - 8.0   Glucose, UA NEGATIVE NEGATIVE mg/dL   Hgb urine dipstick NEGATIVE NEGATIVE   Bilirubin Urine NEGATIVE NEGATIVE   Ketones, ur NEGATIVE NEGATIVE mg/dL   Protein, ur NEGATIVE NEGATIVE mg/dL   Nitrite NEGATIVE NEGATIVE   Leukocytes,Ua NEGATIVE NEGATIVE   RBC / HPF 0-5 0 - 5 RBC/hpf   WBC, UA 6-10 0 - 5 WBC/hpf   Bacteria, UA NONE SEEN NONE SEEN   Squamous Epithelial / LPF 0-5 0 - 5    Comment: Performed at [facility], [place] [address] [place], [place] [address]  Hepatic function panel     Status: Abnormal   Collection Time: 02/05/19  1:48 AM  Result Value Ref Range   Total Protein 6.4 (L) 6.5 - 8.1 g/dL   Albumin 3.5 3.5 - 5.0 g/dL   AST 18 15 - 41 U/L   ALT 17 0 - 44 U/L   Alkaline Phosphatase 55 38 - 126 U/L   Total Bilirubin 1.7 (H) 0.3 - 1.2 mg/dL   Bilirubin, Direct 0.3 (H) 0.0 - 0.2 mg/dL   Indirect Bilirubin 1.4 (H) 0.3 - 0.9 mg/dL    Comment: Performed at [facility] [address] [place], [place] [address]  CBC     Status: Abnormal   Collection Time: 02/05/19  1:48 AM  Result Value Ref Range   WBC 13.2 (H) 4.0 - 10.5 K/uL   RBC 4.68 3.87 - 5.11 MIL/uL   Hemoglobin 12.1 12.0 - 15.0 g/dL   HCT 38.2 36.0 - 46.0 %   MCV 81.6 80.0 - 100.0 fL   MCH 25.9 (L) 26.0 - 34.0 pg   MCHC 31.7 30.0 - 36.0 g/dL   RDW 14.0 11.5 - 15.5 %   Platelets 252 150 - 400 K/uL   nRBC 0.0 0.0 - 0.2 %    Comment: Performed at [facility], [place] [address], [place], [place] [address]  Surgical pcr screen     Status: None   Collection Time: 02/05/19  1:55 AM   Specimen: Nasal Mucosa; Nasal Swab  Result Value Ref Range   MRSA, PCR NEGATIVE  NEGATIVE   Staphylococcus aureus NEGATIVE NEGATIVE    Comment: (NOTE) The Xpert SA Assay (FDA approved for NASAL specimens in patients 68 years of age and older), is one component of a comprehensive surveillance program. It is not intended to diagnose infection nor to guide or monitor treatment. Performed at [facility], [place] [address] [place], [place] [address]   Glucose, capillary     Status: Abnormal   Collection Time: 02/05/19  4:09 AM  Result Value Ref Range  Glucose-Capillary 174 (H) 70 - 99 mg/dL  Glucose, capillary     Status: Abnormal   Collection Time: 02/05/19  7:16 AM  Result Value Ref Range   Glucose-Capillary 140 (H) 70 - 99 mg/dL    Chest Portable 1 View  Result Date: 02/04/2019 CLINICAL DATA:  Initial evaluation for acute trauma, fall. EXAM: PORTABLE CHEST 1 VIEW COMPARISON:  Prior radiograph from 01/29/2018. FINDINGS: Cardiac and mediastinal silhouettes are stable in size and contour, and remain within normal limits. Lungs well inflated. No focal infiltrates. No edema or effusion. No pneumothorax. Remotely healed left clavicular fracture noted. Few additional remotely healed left-sided rib fractures. No acute osseous abnormality. Visualized soft tissues demonstrate no acute finding. IMPRESSION: 1. No active cardiopulmonary disease. 2. Remotely healed left clavicular and left-sided rib fractures, stable. No acute osseous abnormality. Electronically Signed   By: Jeannine Boga M.D.   On: 02/04/2019 19:50   DG Knee Complete 4 Views Left  Result Date: 02/04/2019 CLINICAL DATA:  Fall. Left knee pain and deformity. Previous knee replacement. Initial encounter. EXAM: LEFT KNEE - COMPLETE 4+ VIEW COMPARISON:  04/19/2006 FINDINGS: Total knee arthroplasty is seen. Acute comminuted fracture is seen through the distal femoral metaphysis, just above the femoral prosthetic component. Medial and posterior displacement of the distal fracture fragment is seen. Old fracture  deformity of distal femoral diaphysis is also noted. IMPRESSION: Acute comminuted fracture of the distal femoral metaphysis, just above the femoral component of knee prosthesis. Electronically Signed   By: Marlaine Hind M.D.   On: 02/04/2019 15:45   DG FEMUR MIN 2 VIEWS LEFT  Result Date: 02/04/2019 CLINICAL DATA:  Pain status post fall EXAM: LEFT FEMUR 2 VIEWS COMPARISON:  None. FINDINGS: There is an acute impacted displaced fracture of the distal left femur superior to the knee prosthesis. There is an old healed fracture of the left femoral diaphysis. The patient is status post total knee arthroplasty. There is a suprapatellar joint effusion with evidence for lipohemarthrosis. IMPRESSION: Acute displaced impacted fracture of the distal left femur. Status post total knee arthroplasty. Old healed fracture of the distal left femoral diaphysis. Electronically Signed   By: Constance Holster M.D.   On: 02/04/2019 18:37    ROS multiple as noted above. No recent fever, bleeding abnormalities, urologic dysfunction, GI problems, or weight gain. Blood pressure 120/77, pulse 85, temperature 98.7 F (37.1 C), temperature source Oral, resp. rate 14, SpO2 91 %. Physical Exam NCAT RRR No wheezing LLE Immobilizeer intact, clean, dry  No skin wound or ecchymosis   Edema/ swelling controlled  Sens: DPN, SPN, TN intact  Motor: EHL, FHL, and lessor toe ext and flex all intact grossly  Brisk cap refill, warm to touch, DP 2+ and skin wrinkles        Gait: not observable Coordination and balance: not observable   Assessment/Plan:  Left distal femur periprsothetic femur fracture, femoral deformity, multiple med problems  I have reviewed and discussed in detail with the patient her presentation, examination findings; I have reviewed and interpreted the x-rays and laboratory studies; and I have recommended operative repair.  I then discussed with the patient the risks and benefits of surgery, including the  possibility of infection, nerve injury, vessel injury, wound breakdown, arthritis, symptomatic hardware, DVT/ PE, loss of motion, malunion, nonunion, and need for further surgery among others.  We also specifically discussed the elevated risks of perioperative complications given her multiple medical problems. She acknowledged these risks and wished to proceed.  Weightbearing: NWB  LLE Insicional and dressing care: OK to remove dressings 48 hrs and leave open to air with dry gauze PRN Orthopedic device(s): Walker Showering: Yes VTE prophylaxis: Lovenox 71m qd 4 wks Pain control: Hydrocodone Follow - up plan: 2 weeks Contact information:  MAltamese CarolinaMD, KAinsley SpinnerPA   MAltamese [place] MD [facility], [phone] 02/05/2019, 8:00 AM  [facility] [phone](Domingo Sep(F)

## 2019-02-05 NOTE — Transfer of Care (Signed)
Immediate Anesthesia Transfer of Care Note  Patient: Sheila [name]  Procedure(s) Performed: OPEN REDUCTION INTERNAL FIXATION (ORIF) DISTAL FEMUR FRACTURE (Left )  Patient Location: PACU  Anesthesia Type:General  Level of Consciousness: awake, alert , oriented and patient cooperative  Airway & Oxygen Therapy: Patient Spontanous Breathing and Patient connected to face mask oxygen  Post-op Assessment: Report given to RN and Post -op Vital signs reviewed and stable  Post vital signs: Reviewed and stable  Last Vitals:  Vitals Value Taken Time  BP 131/68 02/05/19 1142  Temp    Pulse 106 02/05/19 1146  Resp 14 02/05/19 1146  SpO2 95 % 02/05/19 1146  Vitals shown include unvalidated device data.  Last Pain:  Vitals:   02/05/19 0546  TempSrc:   PainSc: 9          Complications: No apparent anesthesia complications

## 2019-02-05 NOTE — Progress Notes (Signed)
Orthopedic Tech Progress Note Patient Details:  Sheila [name] 10/27/1960 [id number] Called in order to [employer] for a ROM unlock Patient ID: Sheila [name], female   DOB: Sep 15, 1960, 59 y.o.   MRN: [id number]   Janit Pagan 02/05/2019, 12:41 PM

## 2019-02-05 NOTE — Op Note (Addendum)
02/05/2019  11:43 AM  PATIENT:  Sheila [name]  59 y.o. female  PRE-OPERATIVE DIAGNOSIS:   1. LEFT SUPRACONDYLAR PERIPROSTHETIC DISTAL FEMUR FRACTURE 2. DISTAL FEMORAL DEFORMITY  POST-OPERATIVE DIAGNOSIS:   1. LEFT SUPRACONDYLAR PERIPROSTHETIC DISTAL FEMUR FRACTURE 2. DISTAL FEMORAL DEFORMITYr  PROCEDURE:  Procedure(s): 1. OPEN REDUCTION INTERNAL FIXATION (ORIF) DISTAL FEMUR FRACTURE (Left) USING A ZIMMER NCB DISTAL FEMORAL LOCKING PLATE 2. STRESS FLUOROSCOPY LEFT KNEE POST REPAIR  SURGEON:  Surgeon(s) and Role:    Marcelino Scot, Legrand Como, MD - Primary  PHYSICIAN ASSISTANT: Ainsley Spinner, PA-C  ANESTHESIA:   general  I/O:  Total I/O In: 1829 [I.V.:800; IV Piggyback:250] Out: 650 [Urine:300; Blood:350]  SPECIMEN:  No Specimen  TOURNIQUET:  * No tourniquets in log *  COMPLICATIONS: NONE  DICTATION: .Note written in EPIC  DISPOSITION: TO PACU  CONDITION: STABLE  DELAY START OF DVT PROPHYLAXIS BECAUSE OF BLEEDING RISK: NO  BRIEF SUMMARY OF INDICATION FOR PROCEDURE:  Sheila [name] is a 59 y.o. who sustained a fall resulting in severely comminuted supracondylar femur fracture, which was quite distal and near to the femoral implant and further complicated by malunion of the femoral shaft in both coronal and sagittal planes. The risks and benefits of surgery were discussed with the patient, including the possibility of infection, nerve injury, vessel injury, wound breakdown, arthritis, symptomatic hardware, DVT/ PE, loss of motion, malunion, nonunion, heart attack, stroke, death, and need for further surgery among others.  These risks were acknowledged and consent provided to proceed.   BRIEF SUMMARY OF PROCEDURE:  The patient was taken to the operating room where general anesthesia was induced and after receipt of preoperative antibiotics.  The left lower extremity was prepped and draped in usual sterile fashion.  No tourniquet was used during the procedure.  A radiolucent  triangle was placed underneath the femur and towel bumps to restore appropriate alignment and length. C-arm was brought in to confirm the appropriate position of the distal incision.  This was checked on lateral as well.  An incision was then made.  Dissection was carried down to the IT band.  It was split in line with the incision.  The deep protractor was placed.  Hematoma evacuated. My assistant pulled and maintained traction and dialed in the rotation for alignment.  I then introduced the [employer] NCB plate.  I placed a pin distally parallel to the femoral component and joint line of the tibial tray and then checked the position on both AP and lateral views proximally, placing two additional pins including one proximal to the fracture site. A single screw was tightened distally and then the king tong clamp because of her poor bone quality. I then placed additional screws and sequentially tightened each until the alignment was dialed in.  I then placed proximal screws within the shaft proximally, making sure, particularly because of the defomrity that we were appropriately located within the bone. Locking caps were placed over the heads of the screws distally but none proximally the most proximal screw in the proximal segment, as well, after confirming appropriate position of all screws on orthogonal views.  Wounds were irrigated thoroughly and then closed in standard layered fashion using #1 Vicryl for the tensor, 0 Vicryl for the deep subcu, 2-0 Vicryl and 3-0 vertical mattress sutures for the skin.  Following repair, manual application of stress under fluoroscopy did not show ligamentous injury to the knee joint. A sterile gently compressive dressing was then applied with an Ace wrap from foot  to thigh as well as a knee immobilizer until the patient wakes up adequately from anesthesia at which time she will be allowed unrestricted range of motion. Ainsley Spinner, PA-C, assisted me throughout and an  assistant was necessary to obtain and maintain reduction during provisional and definitive fixation and also assisted with wound closure.   PROGNOSIS:  Because of comorbidities and impaired bone quality, patient is at increased risk for perioperative complications. PT/ OT to assist with nonweightbearing and unrestricted range of motion of the knee without bracing.  Formal pharmacologic DVT prophylaxis with Lovenox. F/u in 10-14 days for removal of sutures.     Astrid Divine. Marcelino Scot, M.D.

## 2019-02-05 NOTE — Anesthesia Preprocedure Evaluation (Addendum)
Anesthesia Evaluation  Patient identified by MRN, date of birth, ID band Patient awake    Reviewed: Allergy & Precautions, H&P , NPO status , Patient's Chart, lab work & pertinent test results, reviewed documented beta blocker date and time   Airway Mallampati: III  TM Distance: >3 FB Neck ROM: Full    Dental no notable dental hx. (+) Dental Advisory Given   Pulmonary asthma , COPD,  COPD inhaler, former smoker,  Quit smoking 1980   breath sounds clear to auscultation       Cardiovascular hypertension, Pt. on medications and Pt. on home beta blockers + dysrhythmias  Rhythm:regular Rate:Normal  Echo 2020: 1. The left ventricle has normal systolic function with an ejection  fraction of 60-65%. The cavity size was normal. Left ventricular diastolic  Doppler parameters are consistent with impaired relaxation No evidence of  left ventricular regional wall  motion abnormalities.  2. No evidence of left ventricular regional wall motion abnormalities.  3. The right ventricle has normal systolic function. The cavity was  normal. There is no increase in right ventricular wall thickness.  4. Right atrial pressure is estimated at 10 mmHg.Unable to estimate RVSP    Neuro/Psych PSYCHIATRIC DISORDERS  Neuromuscular disease negative neurological ROS  negative psych ROS   GI/Hepatic PUD, GERD  Medicated and Controlled,(+)     substance abuse (has not used alcohol in 3 months)  alcohol use, Recovering alcoholic Fatty liver IBS, UC   Endo/Other  diabetes, Well Controlled, Type 2, Oral Hypoglycemic Agentsa1c 6.4 Obesity BMI 38  Renal/GU Renal disease  negative genitourinary   Musculoskeletal  (+) Arthritis , Osteoarthritis,  Left distal femur fx- mechanical fall, found to have periprosthetic knee injury.  DDD   Abdominal Normal abdominal exam  (+) + obese,   Peds  Hematology  (+) Blood dyscrasia, anemia , hct 38, plt 252    Anesthesia Other Findings HLD  Reproductive/Obstetrics negative OB ROS                            Anesthesia Physical  Anesthesia Plan  ASA: III  Anesthesia Plan: General   Post-op Pain Management:    Induction: Intravenous  PONV Risk Score and Plan: 3 and Ondansetron, Dexamethasone and Treatment may vary due to age or medical condition  Airway Management Planned: Oral ETT  Additional Equipment: None  Intra-op Plan:   Post-operative Plan: Extubation in OR  Informed Consent: I have reviewed the patients History and Physical, chart, labs and discussed the procedure including the risks, benefits and alternatives for the proposed anesthesia with the patient or authorized representative who has indicated his/her understanding and acceptance.     Dental Advisory Given  Plan Discussed with: CRNA  Anesthesia Plan Comments:       Anesthesia Quick Evaluation

## 2019-02-05 NOTE — Progress Notes (Addendum)
Orthopedic Tech Progress Note Patient Details:  [name] January 26, 1960 [id number] I applied an Over Head Frame with trapeze for patient. Patient bed had the old frame on the back of bed, so I found a bed in the hall and applied it to that bed. Let RN and NT know this bed was for patient and they said they'll wait until therapy get patient up and switch bed due to patient beginning in a lot of discomfort.  Patient ID: Sheila [name], female   DOB: December 11, 1960, 59 y.o.   MRN: [id number]   Janit Pagan 02/05/2019, 1:23 PM

## 2019-02-05 NOTE — Anesthesia Procedure Notes (Signed)
Procedure Name: Intubation Date/Time: 02/05/2019 8:30 AM Performed by: Renato Shin, CRNA Pre-anesthesia Checklist: Patient identified, Emergency Drugs available, Suction available and Patient being monitored Patient Re-evaluated:Patient Re-evaluated prior to induction Oxygen Delivery Method: Circle system utilized Preoxygenation: Pre-oxygenation with 100% oxygen Induction Type: IV induction Ventilation: Mask ventilation without difficulty and Oral airway inserted - appropriate to patient size Laryngoscope Size: Sabra Heck and 2 Grade View: Grade II Tube type: Oral Tube size: 7.5 mm Number of attempts: 1 Airway Equipment and Method: Stylet and Oral airway Placement Confirmation: ETT inserted through vocal cords under direct vision,  positive ETCO2 and breath sounds checked- equal and bilateral Secured at: 21 cm Tube secured with: Tape Dental Injury: Teeth and Oropharynx as per pre-operative assessment

## 2019-02-05 NOTE — Plan of Care (Signed)

## 2019-02-06 ENCOUNTER — Encounter (HOSPITAL_COMMUNITY): Payer: Self-pay | Admitting: Internal Medicine

## 2019-02-06 LAB — BASIC METABOLIC PANEL
Anion gap: 10 (ref 5–15)
BUN: 14 mg/dL (ref 6–20)
CO2: 29 mmol/L (ref 22–32)
Calcium: 7.9 mg/dL — ABNORMAL LOW (ref 8.9–10.3)
Chloride: 98 mmol/L (ref 98–111)
Creatinine, Ser: 0.78 mg/dL (ref 0.44–1.00)
GFR calc Af Amer: >60 mL/min (ref >60)
GFR calc non Af Amer: >60 mL/min (ref >60)
Glucose, Bld: 140 mg/dL — ABNORMAL HIGH (ref 70–99)
Potassium: 3.5 mmol/L (ref 3.5–5.1)
Sodium: 137 mmol/L (ref 135–145)

## 2019-02-06 LAB — CBC
HCT: 29.2 % — ABNORMAL LOW (ref 36.0–46.0)
Hemoglobin: 9.2 g/dL — ABNORMAL LOW (ref 12.0–15.0)
MCH: 25.9 pg — ABNORMAL LOW (ref 26.0–34.0)
MCHC: 31.5 g/dL (ref 30.0–36.0)
MCV: 82.3 fL (ref 80.0–100.0)
Platelets: 194 10*3/uL (ref 150–400)
RBC: 3.55 MIL/uL — ABNORMAL LOW (ref 3.87–5.11)
RDW: 13.8 % (ref 11.5–15.5)
WBC: 12.6 10*3/uL — ABNORMAL HIGH (ref 4.0–10.5)
nRBC: 0 % (ref 0.0–0.2)

## 2019-02-06 LAB — GLUCOSE, CAPILLARY
Glucose-Capillary: 133 mg/dL — ABNORMAL HIGH (ref 70–99)
Glucose-Capillary: 135 mg/dL — ABNORMAL HIGH (ref 70–99)
Glucose-Capillary: 159 mg/dL — ABNORMAL HIGH (ref 70–99)
Glucose-Capillary: 195 mg/dL — ABNORMAL HIGH (ref 70–99)
Glucose-Capillary: 205 mg/dL — ABNORMAL HIGH (ref 70–99)
Glucose-Capillary: 234 mg/dL — ABNORMAL HIGH (ref 70–99)

## 2019-02-06 LAB — CALCIUM, IONIZED: Calcium, Ionized, Serum: 4.5 mg/dL (ref 4.5–5.6)

## 2019-02-06 LAB — URINE CULTURE: Culture: NO GROWTH

## 2019-02-06 LAB — MAGNESIUM: Magnesium: 1.6 mg/dL — ABNORMAL LOW (ref 1.7–2.4)

## 2019-02-06 MED ORDER — POLYETHYLENE GLYCOL 3350 17 G PO PACK
17.0000 g | PACK | Freq: Every day | ORAL | Status: DC
  Administered 2019-02-06 – 2019-02-07 (×2): 17 g via ORAL
  Filled 2019-02-06 (×2): qty 1

## 2019-02-06 MED ORDER — WHITE PETROLATUM EX OINT
TOPICAL_OINTMENT | CUTANEOUS | Status: AC
  Administered 2019-02-06: 0.2
  Filled 2019-02-06: qty 28.35

## 2019-02-06 MED ORDER — MAGNESIUM SULFATE 2 GM/50ML IV SOLN
2.0000 g | Freq: Once | INTRAVENOUS | Status: AC
  Administered 2019-02-06: 12:00:00 2 g via INTRAVENOUS
  Filled 2019-02-06 (×2): qty 50

## 2019-02-06 MED ORDER — CHLORHEXIDINE GLUCONATE CLOTH 2 % EX PADS: 6.0000 | MEDICATED_PAD | Freq: Every day | CUTANEOUS | Status: DC

## 2019-02-06 MED ORDER — CIPROFLOXACIN HCL 500 MG PO TABS
500.0000 mg | ORAL_TABLET | Freq: Two times a day (BID) | ORAL | Status: DC
  Administered 2019-02-06 – 2019-02-07 (×3): 500 mg via ORAL
  Filled 2019-02-06 (×3): qty 1

## 2019-02-06 MED ORDER — POTASSIUM CHLORIDE CRYS ER 20 MEQ PO TBCR
20.0000 meq | EXTENDED_RELEASE_TABLET | Freq: Once | ORAL | Status: AC
  Administered 2019-02-06: 20 meq via ORAL
  Filled 2019-02-06: qty 1

## 2019-02-06 MED ORDER — PNEUMOCOCCAL VAC POLYVALENT 25 MCG/0.5ML IJ INJ
0.5000 mL | INJECTION | INTRAMUSCULAR | Status: DC
  Filled 2019-02-06: qty 0.5

## 2019-02-06 NOTE — Evaluation (Signed)
Physical Therapy Evaluation Patient Details Name: Sheila [name] MRN: [id number] DOB: 02/08/1960 Today's Date: 02/06/2019   History of Present Illness  59 yo admitted after fall with left periprosthetic femur fx s/p ORIF 2/1. PMHx: bil TKA, anemia, arthritis, asthma, COPD, DDD, DM, HTN, neuromuscular disorder, colitis, ETOH use disorder  Clinical Impression  Pt pleasant, reports DME at home from prior TKA with assist of daughter and spouse at home. Daughter present via facetime for education for assisted ROM and hinged knee brace positioning. Pt with decreased strength, ROM, transfers, gait and activity tolerance who will benefit from acute therapy to maximize mobility, safety and function to decrease burden of care.    Follow Up Recommendations Home health PT    Equipment Recommendations  None recommended by PT    Recommendations for Other Services       Precautions / Restrictions Precautions Precautions: Fall Required Braces or Orthoses: Other Brace Other Brace: hinge brace in unlocked position Restrictions Weight Bearing Restrictions: Yes LLE Weight Bearing: Non weight bearing      Mobility  Bed Mobility Overal bed mobility: Needs Assistance Bed Mobility: Supine to Sit     Supine to sit: Supervision     General bed mobility comments: pt able to progress to EOB with increase time with hOB 30 degress  Transfers Overall transfer level: Needs assistance Equipment used: Rolling walker (2 wheeled) Transfers: Sit to/from Stand Sit to Stand: Min assist         General transfer comment: cues for hand placement and sequence with LLE on P.T. foot with transition to maintain NWB  Ambulation/Gait Ambulation/Gait assistance: Min assist Gait Distance (Feet): 15 Feet Assistive device: Rolling walker (2 wheeled) Gait Pattern/deviations: Step-to pattern   Gait velocity interpretation: 1.31 - 2.62 ft/sec, indicative of limited community ambulator General Gait Details: cues  for sequence, safety and posture. Pt started off strong with good hops and maintaining NWB after 7' pt began to fatigue needing increased cues to maintain NWB as pt touching down on foot, assist to direct RW and ultimately chair pulled to her as unable to back to surface  Stairs            Wheelchair Mobility    Modified Rankin (Stroke Patients Only)       Balance Overall balance assessment: Needs assistance   Sitting balance-Leahy Scale: Good     Standing balance support: Bilateral upper extremity supported Standing balance-Leahy Scale: Poor Standing balance comment: bil UE support to maintain NWB, admitted due to fall                             Pertinent Vitals/Pain Pain Assessment: 0-10 Pain Score: 9  Pain Location: L Leg Pain Descriptors / Indicators: Sore Pain Intervention(s): Limited activity within patient's tolerance;Monitored during session;Premedicated before session;Repositioned    Home Living Family/patient expects to be discharged to:: Private residence Living Arrangements: Other (Comment)(boyfriend and daughter) Available Help at Discharge: Family;Available 24 hours/day Type of Home: Mobile home Home Access: Level entry     Home Layout: One level Home Equipment: Shower seat;Adaptive equipment;Walker - 2 wheels;Crutches;Bedside commode Additional Comments: daughter will stay with her for 6 weeks     Prior Function Level of Independence: Independent               Hand Dominance   Dominant Hand: Right    Extremity/Trunk Assessment   Upper Extremity Assessment Upper Extremity Assessment: Defer to OT evaluation  Lower Extremity Assessment Lower Extremity Assessment: LLE deficits/detail LLE Deficits / Details: decreased ROM grossly 20 degrees AAROM  knee, decreased strength limited by pain, surgical dressing    Cervical / Trunk Assessment Cervical / Trunk Assessment: Normal  Communication   Communication: No difficulties   Cognition Arousal/Alertness: Awake/alert Behavior During Therapy: WFL for tasks assessed/performed Overall Cognitive Status: Within Functional Limits for tasks assessed                                        General Comments General comments (skin integrity, edema, etc.): educated on hinge brace and able to remove for clothing bathing. pt reports "i will leave it on til they take it off" Daughter on facetime to see how to help with LE ROM ( knee flexion, abduction, hip flexion)    Exercises General Exercises - Lower Extremity Short Arc QuadSinclair Ship;Left;Seated;10 reps Hip ABduction/ADduction: AROM;Left;Seated;5 reps Straight Leg Raises: AROM;Left;Seated;5 reps   Assessment/Plan    PT Assessment Patient needs continued PT services  PT Problem List Decreased strength;Decreased mobility;Decreased range of motion;Decreased activity tolerance;Decreased balance;Decreased knowledge of use of DME;Pain;Decreased knowledge of precautions;Decreased safety awareness       PT Treatment Interventions DME instruction;Therapeutic exercise;Gait training;Balance training;Stair training;Functional mobility training;Therapeutic activities;Patient/family education    PT Goals (Current goals can be found in the Care Plan section)  Acute Rehab PT Goals Patient Stated Goal: return home and watch tv PT Goal Formulation: With patient Time For Goal Achievement: 02/20/19 Potential to Achieve Goals: Good    Frequency Min 5X/week   Barriers to discharge        Co-evaluation PT/OT/SLP Co-Evaluation/Treatment: Yes Reason for Co-Treatment: For patient/therapist safety PT goals addressed during session: Mobility/safety with mobility;Proper use of DME;Strengthening/ROM OT goals addressed during session: ADL's and self-care;Proper use of Adaptive equipment and DME;Strengthening/ROM       AM-PAC PT "6 Clicks" Mobility  Outcome Measure Help needed turning from your back to your side while  in a flat bed without using bedrails?: A Little Help needed moving from lying on your back to sitting on the side of a flat bed without using bedrails?: A Little Help needed moving to and from a bed to a chair (including a wheelchair)?: A Little Help needed standing up from a chair using your arms (e.g., wheelchair or bedside chair)?: A Little Help needed to walk in hospital room?: A Little Help needed climbing 3-5 steps with a railing? : A Little 6 Click Score: 18    End of Session Equipment Utilized During Treatment: Gait belt Activity Tolerance: Patient tolerated treatment well Patient left: in chair;with call bell/phone within reach;with chair alarm set Nurse Communication: Mobility status PT Visit Diagnosis: Other abnormalities of gait and mobility (R26.89);Difficulty in walking, not elsewhere classified (R26.2)    Time: 3419-3790 PT Time Calculation (min) (ACUTE ONLY): 22 min   Charges:   PT Evaluation $PT Eval Moderate Complexity: 1 Mod          Taytum Wheller P, PT Acute Rehabilitation Services Pager: [phone] Office: [phone]   Adhya Cocco B Shahana Capes 02/06/2019, 1:21 PM

## 2019-02-06 NOTE — Evaluation (Signed)
Occupational Therapy Evaluation Patient Details Name: Sheila [name] MRN: [id number] DOB: 1960/03/24 Today's Date: 02/06/2019    History of Present Illness 59 yo admitted after fall with left periprosthetic femur fx s/p ORIF 2/1. PMHx: bil TKA, anemia, arthritis, asthma, COPD, DDD, DM, HTN, neuromuscular disorder, colitis, ETOH use disorder   Clinical Impression   Patient is s/p ORIF LLE surgery resulting in functional limitations due to the deficits listed below (see OT problem list). Pt currently demonstrates need for (A) with LB dressing and plans to use house dresses at home. Pt demonstrates decrease balance with back up to chairs/ [facility] and will need continued OT to help increase independence with these basic transfers.  Patient will benefit from skilled OT acutely to increase independence and safety with ADLS to allow discharge [place].     Follow Up Recommendations  Home health OT    Equipment Recommendations  None recommended by OT(has DME)    Recommendations for Other Services       Precautions / Restrictions Precautions Precautions: Fall Required Braces or Orthoses: Other Brace Other Brace: hinge brace in unlocked position Restrictions Weight Bearing Restrictions: Yes LLE Weight Bearing: Non weight bearing      Mobility Bed Mobility Overal bed mobility: Needs Assistance Bed Mobility: Supine to Sit     Supine to sit: Supervision     General bed mobility comments: pt able to progress to EOB with increase time with hOB 30 degress  Transfers Overall transfer level: Needs assistance Equipment used: Rolling walker (2 wheeled) Transfers: Sit to/from Stand Sit to Stand: Min assist         General transfer comment: pt able to power up with cues for maintaining NWB LLE. pt fatigues with increase use of LLE toe touch.     Balance Overall balance assessment: Mild deficits observed, not formally tested                                         ADL  either performed or assessed with clinical judgement   ADL Overall ADL's : Needs assistance/impaired Eating/Feeding: Independent   Grooming: Wash/dry hands;Wash/dry face;Modified independent;Sitting   Upper Body Bathing: Modified independent;Sitting           Lower Body Dressing: Maximal assistance   Toilet Transfer: Minimal assistance;Ambulation;RW;BSC   Toileting- Clothing Manipulation and Hygiene: Minimal assistance;Sit to/from stand       Functional mobility during ADLs: Minimal assistance;Rolling walker General ADL Comments: pt demonstrates deficits with backing up to surface so will need continued attempt for 3n1 transfers.      Vision Baseline Vision/History: Wears glasses Wears Glasses: At all times       Perception     Praxis      Pertinent Vitals/Pain Pain Assessment: 0-10 Pain Score: 9  Pain Location: L Leg Pain Descriptors / Indicators: Sore Pain Intervention(s): Monitored during session;Premedicated before session;Repositioned     Hand Dominance Right   Extremity/Trunk Assessment Upper Extremity Assessment Upper Extremity Assessment: Overall WFL for tasks assessed   Lower Extremity Assessment Lower Extremity Assessment: LLE deficits/detail;Defer to PT evaluation LLE Deficits / Details: operative leg   Cervical / Trunk Assessment Cervical / Trunk Assessment: Normal   Communication Communication Communication: No difficulties   Cognition Arousal/Alertness: Awake/alert Behavior During Therapy: WFL for tasks assessed/performed Overall Cognitive Status: Within Functional Limits for tasks assessed  General Comments  educated on hinge brace and able to remove for clothing bathing. pt reports "i will leave it on til they take it off" Daughter on facetime to see how to help with LE ROM ( knee flexion, abduction, hip flexion)    Exercises     Shoulder Instructions      Home Living  Family/patient expects to be discharged to:: Private residence Living Arrangements: Other (Comment)(boyfriend and daughter) Available Help at Discharge: Family;Available 24 hours/day Type of Home: Mobile home Home Access: Level entry     Home Layout: One level     Bathroom Shower/Tub: Teacher, early years/pre: Standard     Home Equipment: Shower seat;Adaptive equipment;Walker - 2 wheels;Crutches;Bedside commode Adaptive Equipment: Reacher Additional Comments: daughter will stay with her for 6 weeks       Prior Functioning/Environment Level of Independence: Independent                 OT Problem List: Decreased strength;Decreased activity tolerance;Impaired balance (sitting and/or standing);Decreased knowledge of use of DME or AE;Pain      OT Treatment/Interventions: Self-care/ADL training;Therapeutic exercise;Energy conservation;DME and/or AE instruction;Manual therapy;Modalities;Therapeutic activities;Patient/family education;Balance training    OT Goals(Current goals can be found in the care plan section) Acute Rehab OT Goals Patient Stated Goal: to be able to return home OT Goal Formulation: With patient Time For Goal Achievement: 02/20/19 Potential to Achieve Goals: Good  OT Frequency: Min 3X/week   Barriers to D/C:            Co-evaluation PT/OT/SLP Co-Evaluation/Treatment: Yes Reason for Co-Treatment: To address functional/ADL transfers;For patient/therapist safety   OT goals addressed during session: ADL's and self-care;Proper use of Adaptive equipment and DME;Strengthening/ROM      AM-PAC OT "6 Clicks" Daily Activity     Outcome Measure Help from another person eating meals?: None Help from another person taking care of personal grooming?: None Help from another person toileting, which includes using toliet, bedpan, or urinal?: A Little Help from another person bathing (including washing, rinsing, drying)?: A Little Help from another person  to put on and taking off regular upper body clothing?: A Little Help from another person to put on and taking off regular lower body clothing?: A Lot 6 Click Score: 19   End of Session Equipment Utilized During Treatment: Gait belt;Rolling walker;Other (comment)(hinge brace) Nurse Communication: Mobility status;Precautions;Weight bearing status  Activity Tolerance: Patient tolerated treatment well Patient left: in chair;with call bell/phone within reach;with chair alarm set  OT Visit Diagnosis: Unsteadiness on feet (R26.81);Muscle weakness (generalized) (M62.81)                Time: 5790-3833 OT Time Calculation (min): 22 min Charges:  OT General Charges $OT Visit: 1 Visit OT Evaluation $OT Eval Moderate Complexity: 1 Mod   Brynn, OTR/L  Acute Rehabilitation Services Pager: [phone] Office: [phone] .   Jeri Modena 02/06/2019, 12:05 PM

## 2019-02-06 NOTE — Progress Notes (Signed)
Orthopaedic Trauma Service Progress Note  Patient ID: Sheila [name] MRN: [id number] DOB/AGE: 02/17/1960 59 y.o.  Subjective:  Doing well Pain tolerable Wants to use crutches instead of walker  No other complaints  ROS As above  Objective:   VITALS:   Vitals:   02/06/19 0856 02/06/19 0858 02/06/19 0900 02/06/19 1315  BP: (!) 89/40 (!) 142/117 (!) 96/58   Pulse: 87 79 80   Resp: 18 18 18    Temp: 99.1 F (37.3 C) 98.4 F (36.9 C) 98.2 F (36.8 C)   TempSrc: Oral Oral Oral   SpO2: 97% 98% 94% 94%    Estimated body mass index is 38.08 kg/m as calculated from the following:   Height as of an earlier encounter on 02/04/19: 5' (1.524 m).   Weight as of an earlier encounter on 02/04/19: 88.5 kg.   Intake/Output      02/01 0701 - 02/02 0700 02/02 0701 - 02/03 0700   P.O.  240   I.V. 800    IV Piggyback 250    Total Intake 1050 240   Urine 950    Blood 350    Total Output 1300    Net -250 +240          LABS  Results for orders placed or performed during the hospital encounter of 02/04/19 (from the past 24 hour(s))  Glucose, capillary     Status: Abnormal   Collection Time: 02/05/19  4:30 PM  Result Value Ref Range   Glucose-Capillary 286 (H) 70 - 99 mg/dL  Urine Culture     Status: None   Collection Time: 02/05/19  4:52 PM   Specimen: Urine, Catheterized  Result Value Ref Range   Specimen Description URINE, CATHETERIZED    Special Requests NONE    Culture      NO GROWTH Performed at [facility], [place] [address] [place], [place] [address]    Report Status 02/06/2019 FINAL   Glucose, capillary     Status: Abnormal   Collection Time: 02/05/19  7:58 PM  Result Value Ref Range   Glucose-Capillary 261 (H) 70 - 99 mg/dL  Glucose, capillary     Status: Abnormal   Collection Time: 02/05/19 11:58 PM  Result Value Ref Range   Glucose-Capillary 205 (H) 70 - 99 mg/dL  Basic  metabolic panel     Status: Abnormal   Collection Time: 02/06/19  3:40 AM  Result Value Ref Range   Sodium 137 135 - 145 mmol/L   Potassium 3.5 3.5 - 5.1 mmol/L   Chloride 98 98 - 111 mmol/L   CO2 29 22 - 32 mmol/L   Glucose, Bld 140 (H) 70 - 99 mg/dL   BUN 14 6 - 20 mg/dL   Creatinine, Ser 0.78 0.44 - 1.00 mg/dL   Calcium 7.9 (L) 8.9 - 10.3 mg/dL   GFR calc non Af Amer >60 >60 mL/min   GFR calc Af Amer >60 >60 mL/min   Anion gap 10 5 - 15  CBC     Status: Abnormal   Collection Time: 02/06/19  3:40 AM  Result Value Ref Range   WBC 12.6 (H) 4.0 - 10.5 K/uL   RBC 3.55 (L) 3.87 - 5.11 MIL/uL   Hemoglobin 9.2 (L) 12.0 - 15.0 g/dL   HCT 29.2 (L)  36.0 - 46.0 %   MCV 82.3 80.0 - 100.0 fL   MCH 25.9 (L) 26.0 - 34.0 pg   MCHC 31.5 30.0 - 36.0 g/dL   RDW 13.8 11.5 - 15.5 %   Platelets 194 150 - 400 K/uL   nRBC 0.0 0.0 - 0.2 %  Magnesium     Status: Abnormal   Collection Time: 02/06/19  3:40 AM  Result Value Ref Range   Magnesium 1.6 (L) 1.7 - 2.4 mg/dL  Glucose, capillary     Status: Abnormal   Collection Time: 02/06/19  4:20 AM  Result Value Ref Range   Glucose-Capillary 133 (H) 70 - 99 mg/dL  Glucose, capillary     Status: Abnormal   Collection Time: 02/06/19  8:43 AM  Result Value Ref Range   Glucose-Capillary 135 (H) 70 - 99 mg/dL  Glucose, capillary     Status: Abnormal   Collection Time: 02/06/19 12:15 PM  Result Value Ref Range   Glucose-Capillary 195 (H) 70 - 99 mg/dL   CBG (last 3)  Recent Labs    02/06/19 0420 02/06/19 0843 02/06/19 1215  GLUCAP 133* 135* 195*     PHYSICAL EXAM:   Gen: in bed, eating lunch  Lungs: unlabored Cardiac: regular Ext:       Left Lower Extremity  Dressings c/d/i  Hinged brace fitting well  Swelling controlled  Ext warm   + DP pulse  No DCT  Compartments are soft  No pain with passive stretching  Distal motor and sensory functions intact  Assessment/Plan: 1 Day Post-Op   Principal Problem:   Femur fracture  (HCC) Active Problems:   Diabetes (HCC)   HTN (hypertension)   Leukocytosis   Alcohol use   Anti-infectives (From admission, onward)   Start     Dose/Rate Route Frequency Ordered Stop   02/06/19 1030  ciprofloxacin (CIPRO) tablet 500 mg     500 mg Oral 2 times daily 02/06/19 1029 02/09/19 0759   02/05/19 1500  clindamycin (CLEOCIN) IVPB 600 mg     600 mg 100 mL/hr over 30 Minutes Intravenous Every 6 hours 02/05/19 1237 02/06/19 0501   02/05/19 0600  clindamycin (CLEOCIN) IVPB 900 mg     900 mg 100 mL/hr over 30 Minutes Intravenous To Short Stay 02/04/19 2315 02/05/19 0913    .  POD/HD#: 1  59 y/o female s/p L periprosthetic distal femur fracture, antecedent L femoral shaft fracture with malunion   -L periprosthetic distal femur fracture, L femoral shaft malunion s/p ORIF  NWB x 8 weeks  Unrestricted ROM L knee and ankle  PT/OT  Ice and elevate  Dressing change tomorrow   - Pain management:  Continue with current regimen   Scheduled tylenol   Percocet PRN   - ABL anemia/Hemodynamics  Stable  - Medical issues   Per medicine  - DVT/PE prophylaxis:  Lovenox x 21 days post op   - ID:   periop abx   - Metabolic Bone Disease:  Labs pending  Sounds as if pt fractured femur then fell  Will need DEXA as out pt  Numerous risk factors for poor bone quality    - Activity:  Therapies  - FEN/GI prophylaxis/Foley/Lines:  Carb mod diet  - Impediments to fracture healing:  Poor bone quality   DM   - Dispo:  Therapies   Should be good for [place], PA-C [phone] (C) 02/06/2019, 1:36 PM  [facility] [phone]  [place] [address] [phone] (F)   After 6pm on weekdays please call office number to get in touch with on call provider or refer to [place] and look to see who is on call for the Sports Medicine Call Group which is listed under orthopaedics   On Weekends please call office  number to get in touch with on call provider or refer to [place] and look to see who is on call for the Sports Medicine Call Group which is listed under orthopaedics

## 2019-02-06 NOTE — Progress Notes (Signed)
PROGRESS NOTE    Sheila [name]  PPJ:[id number] DOB: Feb 21, 1960 DOA: 02/04/2019 PCP: Ellamae Sia, MD   Brief Narrative: 59 year old with past medical history significant for anemia, arthritis, asthma, COPD, lumbar DDD, noninsulin-dependent diabetes type 2, hypertension, neuromuscular disorder, ulcerative colitis, alcohol use disorder who presents with complaint of left knee pain after mechanical fall.  Imaging revealed left closed peri-prosthetic distal femur fracture  in the setting of previous femoral shaft nonunion. Patient underwent open reduction internal fixation distal femoral fracture on 02/05/2019. Post operative day 1. Needs PT evaluation.    Assessment & Plan:   Principal Problem:   Femur fracture ([place]) Active Problems:   Diabetes ([place])   HTN (hypertension)   Leukocytosis   Alcohol use  1-Left closed periprostatic distal femoral fracture in the setting of previous femoral shaft nonunion: Continue with fentanyl and Norco as needed for pain control. Patient underwent open reduction internal fixation distal femoral fracture on 02/05/2019. PT evaluation none DVT prophylaxis per orthopedic.  2-UTI; report dysuria, UA with 6-10 WBC.  Leukocytosis at 12.  Chest x-ray negative. UA with 6-10 white blood cell.  Follow  urine culture.   Start Cipro for 3 days. Multiples antibiotics allergies.   3-Diabetes type 2: Started  low-dose Lantus.  Continue with a sliding scale insulin.  Hold Metformin while inpatient.  4-HTN;  Continue with Hydralazine PRN.  Hold diuretics. Continue with metoprolol.   5-Chronic Ulcerative Colitis:  No acute exacerbation.  Resume mesalamine.   History of alcohol use disorder;  Denies drinking Alcohol.  On CIWA  Hypokalemia:k improved to 3.5. will give one time dose 20 meq today.   Asthma: Continue with albuterol.  Hypomagnesemia; replete IV>   Anemia; acute blood lost anemia Post sx expected.  Monitor.  Hb decrease from 10 ---9.    Constipation; start miralax.   Estimated body mass index is 38.08 kg/m as calculated from the following:   Height as of an earlier encounter on 02/04/19: 5' (1.524 m).   Weight as of an earlier encounter on 02/04/19: 88.5 kg.   DVT prophylaxis: Lovenox Code Status: Full code Family Communication: Care discussed with patient Disposition Plan:  Patient is from: Home Anticipated d/c date: in 1  Day.  Barriers to d/c or necessity for inpatient status: Patient post surgery day 1.  She will need to be evaluated by physical therapy and cleared by orthopedic to be discharged.   Consultants:   Ortho  Procedures:  Patient underwent open reduction internal fixation distal femoral fracture on 02/05/2019.  Antimicrobials:  Received a dose of clindamycin.   Subjective: Pain controlled.  She would want to go home from hospital. No BM yet   Objective: Vitals:   02/06/19 0456 02/06/19 0856 02/06/19 0858 02/06/19 0900  BP: 101/61 (!) 89/40 (!) 142/117 (!) 96/58  Pulse: 88 87 79 80  Resp: 16 18 18 18   Temp: 98.1 F (36.7 C) 99.1 F (37.3 C) 98.4 F (36.9 C) 98.2 F (36.8 C)  TempSrc: Oral Oral Oral Oral  SpO2: 94% 97% 98% 94%    Intake/Output Summary (Last 24 hours) at 02/06/2019 1029 Last data filed at 02/06/2019 0600 Gross per 24 hour  Intake 800 ml  Output 1000 ml  Net -200 ml   There were no vitals filed for this visit.  Examination:  General exam: NAD Respiratory system: CTA Cardiovascular system: S 1, S  2 RRR Gastrointestinal system: BS present, soft, nt Central nervous system: non focal.  Extremities: Left lower  extremity with dressing on knee immobilizer Skin: No rashes, lesions or ulcers     Data Reviewed: I have personally reviewed following labs and imaging studies  CBC: Recent Labs  Lab 02/04/19 2254 02/05/19 0148 02/05/19 1315 02/06/19 0340  WBC 15.0* 13.2* 13.4* 12.6*  NEUTROABS 10.9*  --   --   --   HGB 13.0 12.1 10.6* 9.2*  HCT 41.0 38.2 33.2*  29.2*  MCV 82.7 81.6 82.2 82.3  PLT 277 252 186 878   Basic Metabolic Panel: Recent Labs  Lab 02/04/19 2254 02/05/19 1315 02/06/19 0340  NA 136 137 137  K 3.1* 3.1* 3.5  CL 102 97* 98  CO2 25 27 29   GLUCOSE 152* 263* 140*  BUN 10 9 14   CREATININE 0.62 0.74  0.72 0.78  CALCIUM 8.1* 8.2* 7.9*  MG  --   --  1.6*   GFR: Estimated Creatinine Clearance: 76.8 mL/min (by C-G formula based on SCr of 0.78 mg/dL). Liver Function Tests: Recent Labs  Lab 02/05/19 0148  AST 18  ALT 17  ALKPHOS 55  BILITOT 1.7*  PROT 6.4*  ALBUMIN 3.5   No results for input(s): LIPASE, AMYLASE in the last 168 hours. No results for input(s): AMMONIA in the last 168 hours. Coagulation Profile: Recent Labs  Lab 02/04/19 2254  INR 1.0   Cardiac Enzymes: No results for input(s): CKTOTAL, CKMB, CKMBINDEX, TROPONINI in the last 168 hours. BNP (last 3 results) No results for input(s): PROBNP in the last 8760 hours. HbA1C: Recent Labs    02/04/19 2254  HGBA1C 6.4*   CBG: Recent Labs  Lab 02/05/19 1630 02/05/19 1958 02/05/19 2358 02/06/19 0420 02/06/19 0843  GLUCAP 286* 261* 205* 133* 135*   Lipid Profile: No results for input(s): CHOL, HDL, LDLCALC, TRIG, CHOLHDL, LDLDIRECT in the last 72 hours. Thyroid Function Tests: No results for input(s): TSH, T4TOTAL, FREET4, T3FREE, THYROIDAB in the last 72 hours. Anemia Panel: No results for input(s): VITAMINB12, FOLATE, FERRITIN, TIBC, IRON, RETICCTPCT in the last 72 hours. Sepsis Labs: No results for input(s): PROCALCITON, LATICACIDVEN in the last 168 hours.  Recent Results (from the past 240 hour(s))  SARS CORONAVIRUS 2 (TAT 6-24 HRS) Nasopharyngeal Nasopharyngeal Swab     Status: None   Collection Time: 02/04/19  8:17 PM   Specimen: Nasopharyngeal Swab  Result Value Ref Range Status   SARS Coronavirus 2 NEGATIVE NEGATIVE Final    Comment: (NOTE) SARS-CoV-2 target nucleic acids are NOT DETECTED. The SARS-CoV-2 RNA is generally  detectable in upper and lower respiratory specimens during the acute phase of infection. Negative results do not preclude SARS-CoV-2 infection, do not rule out co-infections with other pathogens, and should not be used as the sole basis for treatment or other patient management decisions. Negative results must be combined with clinical observations, patient history, and epidemiological information. The expected result is Negative. Fact Sheet for Patients: SugarRoll.be Fact Sheet for Healthcare Providers: https://www.woods-mathews.com/ This test is not yet approved or cleared by the [place] FDA and  has been authorized for detection and/or diagnosis of SARS-CoV-2 by FDA under an Emergency Use Authorization (EUA). This EUA will remain  in effect (meaning this test can be used) for the duration of the COVID-19 declaration under Section 56 4(b)(1) of the Act, 21 U.S.C. section 360bbb-3(b)(1), unless the authorization is terminated or revoked sooner. Performed at [facility], [place] [address] [place], [place] [address]   Surgical pcr screen     Status: None   Collection  Time: 02/05/19  1:55 AM   Specimen: Nasal Mucosa; Nasal Swab  Result Value Ref Range Status   MRSA, PCR NEGATIVE NEGATIVE Final   Staphylococcus aureus NEGATIVE NEGATIVE Final    Comment: (NOTE) The Xpert SA Assay (FDA approved for NASAL specimens in patients 40 years of age and older), is one component of a comprehensive surveillance program. It is not intended to diagnose infection nor to guide or monitor treatment. Performed at [facility], [place] [employer] [address] [place], [place] [address]          Radiology Studies: Chest Portable 1 View  Result Date: 02/04/2019 CLINICAL DATA:  Initial evaluation for acute trauma, fall. EXAM: PORTABLE CHEST 1 VIEW COMPARISON:  Prior radiograph from 01/29/2018. FINDINGS: Cardiac and mediastinal silhouettes are stable in  size and contour, and remain within normal limits. Lungs well inflated. No focal infiltrates. No edema or effusion. No pneumothorax. Remotely healed left clavicular fracture noted. Few additional remotely healed left-sided rib fractures. No acute osseous abnormality. Visualized soft tissues demonstrate no acute finding. IMPRESSION: 1. No active cardiopulmonary disease. 2. Remotely healed left clavicular and left-sided rib fractures, stable. No acute osseous abnormality. Electronically Signed   By: Jeannine Boga M.D.   On: 02/04/2019 19:50   DG Knee Complete 4 Views Left  Result Date: 02/04/2019 CLINICAL DATA:  Fall. Left knee pain and deformity. Previous knee replacement. Initial encounter. EXAM: LEFT KNEE - COMPLETE 4+ VIEW COMPARISON:  04/19/2006 FINDINGS: Total knee arthroplasty is seen. Acute comminuted fracture is seen through the distal femoral metaphysis, just above the femoral prosthetic component. Medial and posterior displacement of the distal fracture fragment is seen. Old fracture deformity of distal femoral diaphysis is also noted. IMPRESSION: Acute comminuted fracture of the distal femoral metaphysis, just above the femoral component of knee prosthesis. Electronically Signed   By: Marlaine Hind M.D.   On: 02/04/2019 15:45   DG C-Arm 1-60 Min  Result Date: 02/05/2019 CLINICAL DATA:  ORIF left femur EXAM: DG C-ARM 1-60 MIN; LEFT FEMUR 2 VIEWS FLUOROSCOPY TIME:  Fluoroscopy Time:  1 minutes 17 seconds COMPARISON:  None. FINDINGS: Multiple intraoperative fluoroscopic spot images are provided. Interval ORIF of the left distal femur. IMPRESSION: Intraoperative localization. Electronically Signed   By: Kathreen Devoid   On: 02/05/2019 11:36   DG FEMUR MIN 2 VIEWS LEFT  Result Date: 02/05/2019 CLINICAL DATA:  ORIF left femur EXAM: DG C-ARM 1-60 MIN; LEFT FEMUR 2 VIEWS FLUOROSCOPY TIME:  Fluoroscopy Time:  1 minutes 17 seconds COMPARISON:  None. FINDINGS: Multiple intraoperative fluoroscopic spot  images are provided. Interval ORIF of the left distal femur. IMPRESSION: Intraoperative localization. Electronically Signed   By: Kathreen Devoid   On: 02/05/2019 11:36   DG FEMUR MIN 2 VIEWS LEFT  Result Date: 02/04/2019 CLINICAL DATA:  Pain status post fall EXAM: LEFT FEMUR 2 VIEWS COMPARISON:  None. FINDINGS: There is an acute impacted displaced fracture of the distal left femur superior to the knee prosthesis. There is an old healed fracture of the left femoral diaphysis. The patient is status post total knee arthroplasty. There is a suprapatellar joint effusion with evidence for lipohemarthrosis. IMPRESSION: Acute displaced impacted fracture of the distal left femur. Status post total knee arthroplasty. Old healed fracture of the distal left femoral diaphysis. Electronically Signed   By: Constance Holster M.D.   On: 02/04/2019 18:37   DG FEMUR PORT MIN 2 VIEWS LEFT  Result Date: 02/05/2019 CLINICAL DATA:  Fracture. EXAM: LEFT FEMUR PORTABLE 2 VIEWS  COMPARISON:  02/05/2019 and 02/04/2019. FINDINGS: Lateral plate and screw fixation of a comminuted and impacted fracture of the distal femoral metaphysis with slightly improved alignment from 02/04/2019. There continues to be approximately 9 mm posterolateral displacement of the distal fracture fragment. Healed distal femoral shaft fracture. Left total knee arthroplasty. IMPRESSION: Lateral plate and screw fixation of a comminuted and impacted distal femoral metaphyseal fracture with slightly improved anatomic alignment. Electronically Signed   By: Lorin Picket M.D.   On: 02/05/2019 13:25        Scheduled Meds: . acetaminophen  500 mg Oral Q12H  . allopurinol  100 mg Oral Daily  . brinzolamide  1 drop Both Eyes BID  . Chlorhexidine Gluconate Cloth  6 each Topical Daily  . ciprofloxacin  500 mg Oral BID  . docusate sodium  100 mg Oral BID  . enoxaparin (LOVENOX) injection  40 mg Subcutaneous Q24H  . gabapentin  300 mg Oral BID  . insulin aspart   0-9 Units Subcutaneous Q4H  . insulin glargine  7 Units Subcutaneous Daily  . latanoprost  1 drop Both Eyes QHS  . mesalamine  2.4 g Oral Q breakfast  . methocarbamol  750 mg Oral TID  . metoprolol succinate  50 mg Oral QHS  . multivitamin with minerals  1 tablet Oral Daily  . [START ON 02/07/2019] pneumococcal 23 valent vaccine  0.5 mL Intramuscular Tomorrow-1000  . povidone-iodine  2 application Topical Once  . pravastatin  10 mg Oral BH-[id number]  . sucralfate  1 g Oral BID   Continuous Infusions: . lactated ringers 75 mL/hr at 02/05/19 1222  . magnesium sulfate bolus IVPB    . methocarbamol (ROBAXIN) IV       LOS: 2 days    Time spent: 35 minutes    Horatio Bertz A Hanif Radin, MD [facility]   If 7PM-7AM, please contact night-coverage [email or web address]  02/06/2019, 10:29 AM

## 2019-02-07 LAB — BASIC METABOLIC PANEL
Anion gap: 10 (ref 5–15)
BUN: 14 mg/dL (ref 6–20)
CO2: 30 mmol/L (ref 22–32)
Calcium: 8 mg/dL — ABNORMAL LOW (ref 8.9–10.3)
Chloride: 100 mmol/L (ref 98–111)
Creatinine, Ser: 0.69 mg/dL (ref 0.44–1.00)
GFR calc Af Amer: >60 mL/min (ref >60)
GFR calc non Af Amer: >60 mL/min (ref >60)
Glucose, Bld: 156 mg/dL — ABNORMAL HIGH (ref 70–99)
Potassium: 3.2 mmol/L — ABNORMAL LOW (ref 3.5–5.1)
Sodium: 140 mmol/L (ref 135–145)

## 2019-02-07 LAB — CBC
HCT: 26.2 % — ABNORMAL LOW (ref 36.0–46.0)
Hemoglobin: 8.3 g/dL — ABNORMAL LOW (ref 12.0–15.0)
MCH: 26.3 pg (ref 26.0–34.0)
MCHC: 31.7 g/dL (ref 30.0–36.0)
MCV: 83.2 fL (ref 80.0–100.0)
Platelets: 200 10*3/uL (ref 150–400)
RBC: 3.15 MIL/uL — ABNORMAL LOW (ref 3.87–5.11)
RDW: 14.3 % (ref 11.5–15.5)
WBC: 14.1 10*3/uL — ABNORMAL HIGH (ref 4.0–10.5)
nRBC: 0.1 % (ref 0.0–0.2)

## 2019-02-07 LAB — GLUCOSE, CAPILLARY
Glucose-Capillary: 131 mg/dL — ABNORMAL HIGH (ref 70–99)
Glucose-Capillary: 138 mg/dL — ABNORMAL HIGH (ref 70–99)
Glucose-Capillary: 145 mg/dL — ABNORMAL HIGH (ref 70–99)

## 2019-02-07 MED ORDER — ACETAMINOPHEN 500 MG PO TABS: 500.0000 mg | ORAL_TABLET | Freq: Two times a day (BID) | ORAL | 0 refills | Status: AC

## 2019-02-07 MED ORDER — ASCORBIC ACID 500 MG PO TABS: 500.0000 mg | ORAL_TABLET | Freq: Every day | ORAL | Status: DC

## 2019-02-07 MED ORDER — ASCORBIC ACID 500 MG PO TABS: 500.0000 mg | ORAL_TABLET | Freq: Every day | ORAL | 1 refills | Status: AC

## 2019-02-07 MED ORDER — METHOCARBAMOL 500 MG PO TABS: 500.0000 mg | ORAL_TABLET | Freq: Three times a day (TID) | ORAL | 0 refills | Status: DC | PRN

## 2019-02-07 MED ORDER — CIPROFLOXACIN HCL 500 MG PO TABS: 500.0000 mg | ORAL_TABLET | Freq: Two times a day (BID) | ORAL | 0 refills | Status: AC

## 2019-02-07 MED ORDER — OXYCODONE-ACETAMINOPHEN 5-325 MG PO TABS: 1.0000 | ORAL_TABLET | Freq: Four times a day (QID) | ORAL | 0 refills | Status: DC | PRN

## 2019-02-07 MED ORDER — DOCUSATE SODIUM 100 MG PO CAPS: 100.0000 mg | ORAL_CAPSULE | Freq: Two times a day (BID) | ORAL | 0 refills | Status: DC

## 2019-02-07 MED ORDER — VITAMIN D 25 MCG (1000 UNIT) PO TABS: 2000.0000 [IU] | ORAL_TABLET | Freq: Two times a day (BID) | ORAL | Status: DC

## 2019-02-07 MED ORDER — VITAMIN D3 25 MCG PO TABS: 2000.0000 [IU] | ORAL_TABLET | Freq: Two times a day (BID) | ORAL | 2 refills | Status: AC

## 2019-02-07 MED ORDER — POTASSIUM CHLORIDE CRYS ER 20 MEQ PO TBCR
40.0000 meq | EXTENDED_RELEASE_TABLET | Freq: Two times a day (BID) | ORAL | Status: DC
  Administered 2019-02-07: 09:00:00 40 meq via ORAL
  Filled 2019-02-07: qty 2

## 2019-02-07 MED ORDER — ENOXAPARIN SODIUM 40 MG/0.4ML ~~LOC~~ SOLN: 40.0000 mg | SUBCUTANEOUS | 0 refills | Status: DC

## 2019-02-07 MED FILL — ACETAMINOPHEN 500MG XT STRE: 500 | 15 days supply | Qty: 30 | Fill #0

## 2019-02-07 MED FILL — VITAMIN D3 1,000 UNIT TAB: 25 MCG | 30 days supply | Qty: 120 | Fill #0

## 2019-02-07 MED FILL — DOK 100 MG CAPS: 100 | 5 days supply | Qty: 10 | Fill #0

## 2019-02-07 MED FILL — OXYCODONE-APAP 5-325MG: 5-325 | 7 days supply | Qty: 50 | Fill #0

## 2019-02-07 MED FILL — VITAMIN C 500 MG TABLET: 500 | 30 days supply | Qty: 30 | Fill #0

## 2019-02-07 MED FILL — METHOCARBAMOL 500 MG TABS: 500 | 10 days supply | Qty: 60 | Fill #0

## 2019-02-07 MED FILL — CIPROFLOXACIN HCL 500 MG TA: 500 | 2 days supply | Qty: 4 | Fill #0

## 2019-02-07 MED FILL — ENOXAPARIN SODIUM 40 MG/0.4: 40 | 21 days supply | Qty: 8 | Fill #0

## 2019-02-07 NOTE — Plan of Care (Signed)

## 2019-02-07 NOTE — Progress Notes (Signed)
Occupational Therapy Treatment Patient Details Name: Sheila [name] MRN: [id number] DOB: 01-30-1960 Today's Date: 02/07/2019    History of present illness 59 yo admitted after fall with left periprosthetic femur fx s/p ORIF 2/1. PMHx: bil TKA, anemia, arthritis, asthma, COPD, DDD, DM, HTN, neuromuscular disorder, colitis, ETOH use disorder   OT comments  Pt is at adequate level for d/c home with daughter and boyfriends (A). Pt expressed wanting w/c for home and it will be delivered to her home. Pt dressed this session as precursor to d/c. All patients belongs are gathered in bags during session.    Follow Up Recommendations  Home health OT    Equipment Recommendations  None recommended by OT    Recommendations for Other Services      Precautions / Restrictions Precautions Precautions: Fall Required Braces or Orthoses: Other Brace Other Brace: hinge brace in unlocked position Restrictions Weight Bearing Restrictions: Yes LLE Weight Bearing: Non weight bearing       Mobility Bed Mobility Overal bed mobility: Modified Independent                Transfers Overall transfer level: Needs assistance Equipment used: Rolling walker (2 wheeled) Transfers: Sit to/from Stand Sit to Stand: Min guard         General transfer comment: cues to keep weight on R LE. pt touching ground with LLE initially but able to correct    Balance                                           ADL either performed or assessed with clinical judgement   ADL Overall ADL's : Needs assistance/impaired                 Upper Body Dressing : Supervision/safety Upper Body Dressing Details (indicate cue type and reason): pulling long dress over head  Lower Body Dressing: Sit to/from stand;Moderate assistance Lower Body Dressing Details (indicate cue type and reason): Pt unable to reach to thread underwear over brace. pt educated on hinge brace not properly fitting again.  Hinge brace adjusted for proper fit at this time.                General ADL Comments: pt sitting eob and reports nausea but resolves with supine positioning. pt fully dressed as prior to [employer] home     Vision       Perception     Praxis      Cognition Arousal/Alertness: Awake/alert Behavior During Therapy: WFL for tasks assessed/performed Overall Cognitive Status: Impaired/Different from baseline                                 General Comments: impulsive         Exercises     Shoulder Instructions       General Comments boyfriend present to help (A) with driving patient home and to next appointment. Boyfriend educated on how to locate [address] from their home.     Pertinent Vitals/ Pain       Pain Assessment: Faces Faces Pain Scale: Hurts even more Pain Location: L Leg Pain Descriptors / Indicators: Sore Pain Intervention(s): RN gave pain meds during session  Home Living  Prior Functioning/Environment              Frequency  Min 3X/week        Progress Toward Goals  OT Goals(current goals can now be found in the care plan section)  Progress towards OT goals: Progressing toward goals  Acute Rehab OT Goals Patient Stated Goal: return home and watch tv OT Goal Formulation: With patient Time For Goal Achievement: 02/20/19 Potential to Achieve Goals: Good ADL Goals Pt Will Transfer to Toilet: with supervision;ambulating;bedside commode(progressing simulated with basic transfer but unable to be S)  Plan Discharge plan remains appropriate    Co-evaluation                 AM-PAC OT "6 Clicks" Daily Activity     Outcome Measure   Help from another person eating meals?: None Help from another person taking care of personal grooming?: None Help from another person toileting, which includes using toliet, bedpan, or urinal?: A Little Help from another person  bathing (including washing, rinsing, drying)?: A Little Help from another person to put on and taking off regular upper body clothing?: A Little Help from another person to put on and taking off regular lower body clothing?: A Lot 6 Click Score: 19    End of Session Equipment Utilized During Treatment: Gait belt;Rolling walker  OT Visit Diagnosis: Unsteadiness on feet (R26.81);Muscle weakness (generalized) (M62.81)   Activity Tolerance Patient tolerated treatment well   Patient Left in bed;with call bell/phone within reach;Other (comment)(Rn giving medications)   Nurse Communication Mobility status;Precautions;Weight bearing status        Time: 1057-1120 OT Time Calculation (min): 23 min  Charges: OT General Charges $OT Visit: 1 Visit OT Treatments $Self Care/Home Management : 23-37 mins   Brynn, OTR/L  Acute Rehabilitation Services Pager: [phone] Office: [phone] .    Jeri Modena 02/07/2019, 12:24 PM

## 2019-02-07 NOTE — Discharge Summary (Signed)
Physician Discharge Summary  Sheila [name] TWK:[id number] DOB: 1960-12-23 DOA: 02/04/2019  PCP: Ellamae Sia, MD  Admit date: 02/04/2019 Discharge date: 02/07/2019  Admitted From: Home. Disposition: Home  Recommendations for Outpatient Follow-up:  1. Follow up with PCP in 1-2 weeks 2. Please obtain BMP/CBC in one week 3. Follow-up with orthopedics as scheduled.  Home Health: Home health PT OT Equipment/Devices: Walker, hospital bed available at home  Discharge Condition: Stable CODE STATUS: Full code Diet recommendation: Low-carb diet  Discharge summary: 59 year old with history of chronic anemia, arthritis, asthma, COPD, type 2 diabetes on oral hypoglycemics, hypertension who presented to the hospital with left knee pain after mechanical fall.  She was found to have left closed periprosthetic distal femur fracture in the setting of previous femoral shaft nonunion.  She was admitted to hospital underwent open reduction and internal fixation of distal femoral fracture on 02/05/2019.  Left closed periprosthetic distal femoral fracture in the setting of previous femoral shaft nonunion: Currently pain management with oral pain medications.  Surgically stable as per surgery.  Evaluated by PT.  Recommended rehab.  Patient wants to go home with home health PT OT. Nonweightbearing for 8 weeks.  Dressing instructions.  She will have orthopedics follow-up.  Suspected UTI: Urine cultures were negative.  She was started on ciprofloxacin, will treat with total 3 days of therapy.  Hypertension/type 2 diabetes: Chronic medical issues.  Stable.  She will resume her medications.  Electrolytes were replaced before discharge and adequate.  Patient has adequate support at home and she is willing to go home.  Hemoglobin dropped from 13-8.3.  No evidence of active bleeding.  This is probably combination of hemodilution and anticipated blood loss from surgery.  She will need a repeat CBC and BMP done  in 1 week. Leukocytosis probably stress reaction.  [name] treated with antibiotics for 3 days.  Will need recheck to ensure stabilization.  Discharge Diagnoses:  Principal Problem:   Femur fracture ([place]) Active Problems:   Diabetes ([place])   HTN (hypertension)   Leukocytosis   Alcohol use    Discharge Instructions  Discharge Instructions    Call MD for:  redness, tenderness, or signs of infection (pain, swelling, redness, odor or green/yellow discharge around incision site)   Complete by: As directed    Call MD for:  temperature >100.4   Complete by: As directed    Diet Carb Modified   Complete by: As directed    Discharge instructions   Complete by: As directed    NWB x 8 weeks  Unrestricted ROM L knee and ankle   Hinged brace on when mobilizing.  May be off when she is in bed or at rest   Discharge wound care:   Complete by: As directed    Leave dressing on for next 4-5 days , remove and if clean leave it open and dry.   May shower / Bathe   Complete by: As directed      Allergies as of 02/07/2019      Reactions   Ampicillin Anaphylaxis   Erythromycin Anaphylaxis   Omeprazole Anaphylaxis   Penicillins Anaphylaxis, Other (See Comments)   Has patient had a PCN reaction causing immediate rash, facial/tongue/throat swelling, SOB or lightheadedness with hypotension: Yes Has patient had a PCN reaction causing severe rash involving mucus membranes or skin necrosis: No Has patient had a PCN reaction that required hospitalization: Unknown Has patient had a PCN reaction occurring within the last 10 years: Yes If  all of the above answers are "NO", then may proceed with Cephalosporin use.   Triamterene-hctz Shortness Of Breath, Swelling   Quinapril Cough      Medication List    TAKE these medications   acetaminophen 500 MG tablet Commonly known as: TYLENOL Take 1 tablet (500 mg total) by mouth every 12 (twelve) hours.   albuterol 108 (90 Base) MCG/ACT inhaler Commonly  known as: VENTOLIN HFA Inhale 2 puffs into the lungs every 4 (four) hours as needed for wheezing or shortness of breath.   allopurinol 100 MG tablet Commonly known as: ZYLOPRIM Take 100 mg by mouth daily.   ascorbic acid 500 MG tablet Commonly known as: VITAMIN C Take 1 tablet (500 mg total) by mouth daily.   bimatoprost 0.03 % ophthalmic solution Commonly known as: LUMIGAN Place 1 drop into both eyes at bedtime.   brinzolamide 1 % ophthalmic suspension Commonly known as: AZOPT Place 1 drop into both eyes 2 (two) times daily.   chlorthalidone 25 MG tablet Commonly known as: HYGROTON Take 25 mg by mouth daily.   ciprofloxacin 500 MG tablet Commonly known as: CIPRO Take 1 tablet (500 mg total) by mouth 2 (two) times daily for 2 days.   [employer] VITAMIN B12 1000 MCG tablet Generic drug: cyanocobalamin Take 1,000 mcg by mouth daily.   docusate sodium 100 MG capsule Commonly known as: COLACE Take 1 capsule (100 mg total) by mouth 2 (two) times daily.   enoxaparin 40 MG/0.4ML injection Commonly known as: LOVENOX Inject 0.4 mLs (40 mg total) into the skin daily for 21 days. Start taking on: February 07, 9369   folic acid 1 MG tablet Commonly known as: FOLVITE Take 1 tablet (1 mg total) by mouth daily.   gabapentin 300 MG capsule Commonly known as: NEURONTIN Take 1 capsule (300 mg total) by mouth 3 (three) times daily. What changed: when to take this   Klor-Con M20 20 MEQ tablet Generic drug: potassium chloride SA Take 2 tablets (40 mEq total) by mouth daily.   linagliptin 5 MG Tabs tablet Commonly known as: TRADJENTA Take 1 tablet (5 mg total) by mouth daily.   losartan 25 MG tablet Commonly known as: COZAAR Take 25 mg by mouth daily.   mesalamine 1.2 g EC tablet Commonly known as: LIALDA Take 2.4 g by mouth daily with breakfast.   metFORMIN 500 MG 24 hr tablet Commonly known as: GLUCOPHAGE-XR Take 500 mg by mouth 2 (two) times daily.   methocarbamol 500 MG  tablet Commonly known as: ROBAXIN Take 1-2 tablets (500-1,000 mg total) by mouth every 8 (eight) hours as needed for muscle spasms.   metoprolol succinate 50 MG 24 hr tablet Commonly known as: TOPROL-XL Take 50 mg by mouth at bedtime.   multivitamin with minerals Tabs tablet Take 1 tablet by mouth daily.   oxyCODONE-acetaminophen 5-325 MG tablet Commonly known as: PERCOCET/ROXICET Take 1-2 tablets by mouth every 6 (six) hours as needed for moderate pain.   pantoprazole 40 MG tablet Commonly known as: PROTONIX Take 40 mg by mouth every morning.   pravastatin 10 MG tablet Commonly known as: PRAVACHOL Take 10 mg by mouth every morning.   sucralfate 1 g tablet Commonly known as: CARAFATE Take 1 g by mouth 2 (two) times daily.   thiamine 100 MG tablet Take 1 tablet (100 mg total) by mouth daily.   Vitamin D3 25 MCG tablet Commonly known as: Vitamin D Take 2 tablets (2,000 Units total) by mouth 2 (two) times daily.  Durable Medical Equipment  (From admission, onward)         Start     Ordered   02/07/19 [phone]  For home use only DME standard manual wheelchair with seat cushion  Once    Comments: Patient suffers from left periprosthetic distal femur fracture which impairs their ability to perform daily activities like bathing, dressing, grooming and toileting in the home.  A cane or crutch will not resolve issue with performing activities of daily living. A wheelchair will allow patient to safely perform daily activities. Patient can safely propel the wheelchair in the home or has a caregiver who can provide assistance. Length of need 6 months . Accessories: elevating leg rests (ELRs), wheel locks, extensions and anti-tippers.   02/07/19 1443           Discharge Care Instructions  (From admission, onward)         Start     Ordered   02/07/19 0000  Discharge wound care:    Comments: Leave dressing on for next 4-5 days , remove and if clean leave it open  and dry.   02/07/19 1540         Follow-up Information    Altamese [place], MD. Schedule an appointment as soon as possible for a visit in 2 week(s).   Specialty: Orthopedic Surgery Contact information: [place] [address] [phone]          Allergies  Allergen Reactions  . Ampicillin Anaphylaxis  . Erythromycin Anaphylaxis  . Omeprazole Anaphylaxis  . Penicillins Anaphylaxis and Other (See Comments)    Has patient had a PCN reaction causing immediate rash, facial/tongue/throat swelling, SOB or lightheadedness with hypotension: Yes Has patient had a PCN reaction causing severe rash involving mucus membranes or skin necrosis: No Has patient had a PCN reaction that required hospitalization: Unknown Has patient had a PCN reaction occurring within the last 10 years: Yes If all of the above answers are "NO", then may proceed with Cephalosporin use.   . Triamterene-Hctz Shortness Of Breath and Swelling  . Quinapril Cough    Consultations:  Orthopedics   Procedures/Studies: Chest Portable 1 View  Result Date: 02/04/2019 CLINICAL DATA:  Initial evaluation for acute trauma, fall. EXAM: PORTABLE CHEST 1 VIEW COMPARISON:  Prior radiograph from 01/29/2018. FINDINGS: Cardiac and mediastinal silhouettes are stable in size and contour, and remain within normal limits. Lungs well inflated. No focal infiltrates. No edema or effusion. No pneumothorax. Remotely healed left clavicular fracture noted. Few additional remotely healed left-sided rib fractures. No acute osseous abnormality. Visualized soft tissues demonstrate no acute finding. IMPRESSION: 1. No active cardiopulmonary disease. 2. Remotely healed left clavicular and left-sided rib fractures, stable. No acute osseous abnormality. Electronically Signed   By: Jeannine Boga M.D.   On: 02/04/2019 19:50   DG Knee Complete 4 Views Left  Result Date: 02/04/2019 CLINICAL DATA:  Fall. Left knee pain and  deformity. Previous knee replacement. Initial encounter. EXAM: LEFT KNEE - COMPLETE 4+ VIEW COMPARISON:  04/19/2006 FINDINGS: Total knee arthroplasty is seen. Acute comminuted fracture is seen through the distal femoral metaphysis, just above the femoral prosthetic component. Medial and posterior displacement of the distal fracture fragment is seen. Old fracture deformity of distal femoral diaphysis is also noted. IMPRESSION: Acute comminuted fracture of the distal femoral metaphysis, just above the femoral component of knee prosthesis. Electronically Signed   By: Marlaine Hind M.D.   On: 02/04/2019 15:45   DG C-Arm 1-60 Min  Result  Date: 02/05/2019 CLINICAL DATA:  ORIF left femur EXAM: DG C-ARM 1-60 MIN; LEFT FEMUR 2 VIEWS FLUOROSCOPY TIME:  Fluoroscopy Time:  1 minutes 17 seconds COMPARISON:  None. FINDINGS: Multiple intraoperative fluoroscopic spot images are provided. Interval ORIF of the left distal femur. IMPRESSION: Intraoperative localization. Electronically Signed   By: Kathreen Devoid   On: 02/05/2019 11:36   DG FEMUR MIN 2 VIEWS LEFT  Result Date: 02/05/2019 CLINICAL DATA:  ORIF left femur EXAM: DG C-ARM 1-60 MIN; LEFT FEMUR 2 VIEWS FLUOROSCOPY TIME:  Fluoroscopy Time:  1 minutes 17 seconds COMPARISON:  None. FINDINGS: Multiple intraoperative fluoroscopic spot images are provided. Interval ORIF of the left distal femur. IMPRESSION: Intraoperative localization. Electronically Signed   By: Kathreen Devoid   On: 02/05/2019 11:36   DG FEMUR MIN 2 VIEWS LEFT  Result Date: 02/04/2019 CLINICAL DATA:  Pain status post fall EXAM: LEFT FEMUR 2 VIEWS COMPARISON:  None. FINDINGS: There is an acute impacted displaced fracture of the distal left femur superior to the knee prosthesis. There is an old healed fracture of the left femoral diaphysis. The patient is status post total knee arthroplasty. There is a suprapatellar joint effusion with evidence for lipohemarthrosis. IMPRESSION: Acute displaced impacted fracture  of the distal left femur. Status post total knee arthroplasty. Old healed fracture of the distal left femoral diaphysis. Electronically Signed   By: Constance Holster M.D.   On: 02/04/2019 18:37   DG FEMUR PORT MIN 2 VIEWS LEFT  Result Date: 02/05/2019 CLINICAL DATA:  Fracture. EXAM: LEFT FEMUR PORTABLE 2 VIEWS COMPARISON:  02/05/2019 and 02/04/2019. FINDINGS: Lateral plate and screw fixation of a comminuted and impacted fracture of the distal femoral metaphysis with slightly improved alignment from 02/04/2019. There continues to be approximately 9 mm posterolateral displacement of the distal fracture fragment. Healed distal femoral shaft fracture. Left total knee arthroplasty. IMPRESSION: Lateral plate and screw fixation of a comminuted and impacted distal femoral metaphyseal fracture with slightly improved anatomic alignment. Electronically Signed   By: Lorin Picket M.D.   On: 02/05/2019 13:25   Subjective: Patient was seen and examined.  Early morning hours, she was ready to go home. Patient states her pain is controlled with Percocet. Patient states that she has enough support at home and is confident to go home.   Discharge Exam: Vitals:   02/07/19 0405 02/07/19 0723  BP: 115/65 112/85  Pulse: 95 99  Resp:  17  Temp: 98.8 F (37.1 C) 99.6 F (37.6 C)  SpO2: 94% 94%   Vitals:   02/06/19 1948 02/06/19 2040 02/07/19 0405 02/07/19 0723  BP: (!) 98/38 (!) 110/57 115/65 112/85  Pulse: (!) 108 99 95 99  Resp:    17  Temp: 98.9 F (37.2 C)  98.8 F (37.1 C) 99.6 F (37.6 C)  TempSrc: Oral  Oral Oral  SpO2: 94% 96% 94% 94%    General: Pt is alert, awake, not in acute distress, on room air. Cardiovascular: RRR, S1/S2 +, no rubs, no gallops Respiratory: CTA bilaterally, no wheezing, no rhonchi Abdominal: Soft, NT, ND, bowel sounds + Extremities: no edema, no cyanosis Left knee on hinged brace, removed by orthopedic surgery, distal neurovascular status intact.  Wound is clean and  dry.   The results of significant diagnostics from this hospitalization (including imaging, microbiology, ancillary and laboratory) are listed below for reference.     Microbiology: Recent Results (from the past 240 hour(s))  SARS CORONAVIRUS 2 (TAT 6-24 HRS) Nasopharyngeal Nasopharyngeal Swab  Status: None   Collection Time: 02/04/19  8:17 PM   Specimen: Nasopharyngeal Swab  Result Value Ref Range Status   SARS Coronavirus 2 NEGATIVE NEGATIVE Final    Comment: (NOTE) SARS-CoV-2 target nucleic acids are NOT DETECTED. The SARS-CoV-2 RNA is generally detectable in upper and lower respiratory specimens during the acute phase of infection. Negative results do not preclude SARS-CoV-2 infection, do not rule out co-infections with other pathogens, and should not be used as the sole basis for treatment or other patient management decisions. Negative results must be combined with clinical observations, patient history, and epidemiological information. The expected result is Negative. Fact Sheet for Patients: SugarRoll.be Fact Sheet for Healthcare Providers: https://www.woods-mathews.com/ This test is not yet approved or cleared by the [place] FDA and  has been authorized for detection and/or diagnosis of SARS-CoV-2 by FDA under an Emergency Use Authorization (EUA). This EUA will remain  in effect (meaning this test can be used) for the duration of the COVID-19 declaration under Section 56 4(b)(1) of the Act, 21 U.S.C. section 360bbb-3(b)(1), unless the authorization is terminated or revoked sooner. Performed at [facility], [place] [address], [place], [place] [address]   Surgical pcr screen     Status: None   Collection Time: 02/05/19  1:55 AM   Specimen: Nasal Mucosa; Nasal Swab  Result Value Ref Range Status   MRSA, PCR NEGATIVE NEGATIVE Final   Staphylococcus aureus NEGATIVE NEGATIVE Final    Comment: (NOTE) The Xpert SA Assay  (FDA approved for NASAL specimens in patients 5 years of age and older), is one component of a comprehensive surveillance program. It is not intended to diagnose infection nor to guide or monitor treatment. Performed at [facility], [place] [address] [place], [place] [address]   Urine Culture     Status: None   Collection Time: 02/05/19  4:52 PM   Specimen: Urine, Catheterized  Result Value Ref Range Status   Specimen Description URINE, CATHETERIZED  Final   Special Requests NONE  Final   Culture   Final    NO GROWTH Performed at [facility], [address], [place],  [address]    Report Status 02/06/2019 FINAL  Final     Labs: BNP (last 3 results) No results for input(s): BNP in the last 8760 hours. Basic Metabolic Panel: Recent Labs  Lab 02/04/19 2254 02/05/19 1315 02/06/19 0340 02/07/19 0425  NA 136 137 137 140  K 3.1* 3.1* 3.5 3.2*  CL 102 97* 98 100  CO2 25 27 29 30   GLUCOSE 152* 263* 140* 156*  BUN 10 9 14 14   CREATININE 0.62 0.74  0.72 0.78 0.69  CALCIUM 8.1* 8.2* 7.9* 8.0*  MG  --   --  1.6*  --    Liver Function Tests: Recent Labs  Lab 02/05/19 0148  AST 18  ALT 17  ALKPHOS 55  BILITOT 1.7*  PROT 6.4*  ALBUMIN 3.5   No results for input(s): LIPASE, AMYLASE in the last 168 hours. No results for input(s): AMMONIA in the last 168 hours. CBC: Recent Labs  Lab 02/04/19 2254 02/05/19 0148 02/05/19 1315 02/06/19 0340 02/07/19 0425  WBC 15.0* 13.2* 13.4* 12.6* 14.1*  NEUTROABS 10.9*  --   --   --   --   HGB 13.0 12.1 10.6* 9.2* 8.3*  HCT 41.0 38.2 33.2* 29.2* 26.2*  MCV 82.7 81.6 82.2 82.3 83.2  PLT 277 252 186 194 200   Cardiac Enzymes:  No results for input(s): CKTOTAL, CKMB, CKMBINDEX, TROPONINI in the last 168 hours. BNP: Invalid input(s): POCBNP CBG: Recent Labs  Lab 02/06/19 1644 02/06/19 2020 02/06/19 2359 02/07/19 0403 02/07/19 0819  GLUCAP 159* 234* 145* 138* 131*   D-Dimer No results for input(s): DDIMER  in the last 72 hours. Hgb A1c Recent Labs    02/04/19 2254  HGBA1C 6.4*   Lipid Profile No results for input(s): CHOL, HDL, LDLCALC, TRIG, CHOLHDL, LDLDIRECT in the last 72 hours. Thyroid function studies No results for input(s): TSH, T4TOTAL, T3FREE, THYROIDAB in the last 72 hours.  Invalid input(s): FREET3 Anemia work up No results for input(s): VITAMINB12, FOLATE, FERRITIN, TIBC, IRON, RETICCTPCT in the last 72 hours. Urinalysis    Component Value Date/Time   COLORURINE YELLOW 02/05/2019 0052   APPEARANCEUR TURBID (A) 02/05/2019 0052   LABSPEC 1.030 02/05/2019 0052   PHURINE 5.0 02/05/2019 0052   GLUCOSEU NEGATIVE 02/05/2019 0052   HGBUR NEGATIVE 02/05/2019 0052   BILIRUBINUR NEGATIVE 02/05/2019 0052   KETONESUR NEGATIVE 02/05/2019 0052   PROTEINUR NEGATIVE 02/05/2019 0052   NITRITE NEGATIVE 02/05/2019 0052   LEUKOCYTESUR NEGATIVE 02/05/2019 0052   Sepsis Labs Invalid input(s): PROCALCITONIN,  WBC,  LACTICIDVEN Microbiology Recent Results (from the past 240 hour(s))  SARS CORONAVIRUS 2 (TAT 6-24 HRS) Nasopharyngeal Nasopharyngeal Swab     Status: None   Collection Time: 02/04/19  8:17 PM   Specimen: Nasopharyngeal Swab  Result Value Ref Range Status   SARS Coronavirus 2 NEGATIVE NEGATIVE Final    Comment: (NOTE) SARS-CoV-2 target nucleic acids are NOT DETECTED. The SARS-CoV-2 RNA is generally detectable in upper and lower respiratory specimens during the acute phase of infection. Negative results do not preclude SARS-CoV-2 infection, do not rule out co-infections with other pathogens, and should not be used as the sole basis for treatment or other patient management decisions. Negative results must be combined with clinical observations, patient history, and epidemiological information. The expected result is Negative. Fact Sheet for Patients: SugarRoll.be Fact Sheet for Healthcare  Providers: https://www.woods-mathews.com/ This test is not yet approved or cleared by the [place] FDA and  has been authorized for detection and/or diagnosis of SARS-CoV-2 by FDA under an Emergency Use Authorization (EUA). This EUA will remain  in effect (meaning this test can be used) for the duration of the COVID-19 declaration under Section 56 4(b)(1) of the Act, 21 U.S.C. section 360bbb-3(b)(1), unless the authorization is terminated or revoked sooner. Performed at [facility], [place] [address], [place], [place] [address]   Surgical pcr screen     Status: None   Collection Time: 02/05/19  1:55 AM   Specimen: Nasal Mucosa; Nasal Swab  Result Value Ref Range Status   MRSA, PCR NEGATIVE NEGATIVE Final   Staphylococcus aureus NEGATIVE NEGATIVE Final    Comment: (NOTE) The Xpert SA Assay (FDA approved for NASAL specimens in patients 12 years of age and older), is one component of a comprehensive surveillance program. It is not intended to diagnose infection nor to guide or monitor treatment. Performed at [facility], [place] [address], [place], [place] [address]   Urine Culture     Status: None   Collection Time: 02/05/19  4:52 PM   Specimen: Urine, Catheterized  Result Value Ref Range Status   Specimen Description URINE, CATHETERIZED  Final   Special Requests NONE  Final   Culture   Final    NO GROWTH Performed at [facility], [address],  [place], [place] [address]    Report Status 02/06/2019 FINAL  Final     Time coordinating discharge: 35 minutes  SIGNED:   Barb Merino, MD  [facility] 02/07/2019, 11:05 AM

## 2019-02-07 NOTE — Progress Notes (Signed)
Orthopaedic Trauma Service Progress Note  Patient ID: Sheila [name] MRN: [id number] DOB/AGE: [birth year] 59 y.o.  Subjective:  Doing well Pain controlled Mobilizing well Wants to go home today  Voiding w/o difficulty    ROS As above  Objective:   VITALS:   Vitals:   02/06/19 1948 02/06/19 2040 02/07/19 0405 02/07/19 0723  BP: (!) 98/38 (!) 110/57 115/65 112/85  Pulse: (!) 108 99 95 99  Resp:    17  Temp: 98.9 F (37.2 C)  98.8 F (37.1 C) 99.6 F (37.6 C)  TempSrc: Oral  Oral Oral  SpO2: 94% 96% 94% 94%    Estimated body mass index is 38.08 kg/m as calculated from the following:   Height as of an earlier encounter on 02/04/19: 5' (1.524 m).   Weight as of an earlier encounter on 02/04/19: 88.5 kg.   Intake/Output      02/02 0701 - 02/03 0700 02/03 0701 - 02/04 0700   P.O. 240    I.V.     IV Piggyback     Total Intake 240    Urine     Blood     Total Output     Net +240           LABS  Results for orders placed or performed during the hospital encounter of 02/04/19 (from the past 24 hour(s))  Glucose, capillary     Status: Abnormal   Collection Time: 02/06/19 12:15 PM  Result Value Ref Range   Glucose-Capillary 195 (H) 70 - 99 mg/dL  Glucose, capillary     Status: Abnormal   Collection Time: 02/06/19  4:44 PM  Result Value Ref Range   Glucose-Capillary 159 (H) 70 - 99 mg/dL  Glucose, capillary     Status: Abnormal   Collection Time: 02/06/19  8:20 PM  Result Value Ref Range   Glucose-Capillary 234 (H) 70 - 99 mg/dL  Glucose, capillary     Status: Abnormal   Collection Time: 02/06/19 11:59 PM  Result Value Ref Range   Glucose-Capillary 145 (H) 70 - 99 mg/dL  Glucose, capillary     Status: Abnormal   Collection Time: 02/07/19  4:03 AM  Result Value Ref Range   Glucose-Capillary 138 (H) 70 - 99 mg/dL  CBC     Status: Abnormal   Collection Time: 02/07/19  4:25 AM    Result Value Ref Range   WBC 14.1 (H) 4.0 - 10.5 K/uL   RBC 3.15 (L) 3.87 - 5.11 MIL/uL   Hemoglobin 8.3 (L) 12.0 - 15.0 g/dL   HCT 26.2 (L) 36.0 - 46.0 %   MCV 83.2 80.0 - 100.0 fL   MCH 26.3 26.0 - 34.0 pg   MCHC 31.7 30.0 - 36.0 g/dL   RDW 14.3 11.5 - 15.5 %   Platelets 200 150 - 400 K/uL   nRBC 0.1 0.0 - 0.2 %  Basic metabolic panel     Status: Abnormal   Collection Time: 02/07/19  4:25 AM  Result Value Ref Range   Sodium 140 135 - 145 mmol/L   Potassium 3.2 (L) 3.5 - 5.1 mmol/L   Chloride 100 98 - 111 mmol/L   CO2 30 22 - 32 mmol/L   Glucose, Bld 156 (H) 70 - 99 mg/dL   BUN 14 6 - 20  mg/dL   Creatinine, Ser 0.69 0.44 - 1.00 mg/dL   Calcium 8.0 (L) 8.9 - 10.3 mg/dL   GFR calc non Af Amer >60 >60 mL/min   GFR calc Af Amer >60 >60 mL/min   Anion gap 10 5 - 15  Glucose, capillary     Status: Abnormal   Collection Time: 02/07/19  8:19 AM  Result Value Ref Range   Glucose-Capillary 131 (H) 70 - 99 mg/dL     PHYSICAL EXAM:   Gen: Resting comfortably in bed, no acute distress, appears well and very pleasant Lungs: Unlabored Cardiac: Regular Ext:       Left lower extremity    Hinged brace is fitting well, it is unlocked  Dressing is clean dry and intact.  Dressings were removed all incisions are stable.  No active drainage noted.  No signs of infection.  New dressings applied  Extremity is warm  Swelling is well controlled  DPN, SPN, TN sensory function intact  EHL, FHL, lesser toe motor functions intact  + Quad set  No deep calf tenderness  No pain with passive stretching  Assessment/Plan: 2 Days Post-Op   Principal Problem:   Femur fracture (HCC) Active Problems:   Diabetes ([place])   HTN (hypertension)   Leukocytosis   Alcohol use   Anti-infectives (From admission, onward)   Start     Dose/Rate Route Frequency Ordered Stop   02/06/19 1030  ciprofloxacin (CIPRO) tablet 500 mg     500 mg Oral 2 times daily 02/06/19 1029 02/09/19 0759   02/05/19 1500   clindamycin (CLEOCIN) IVPB 600 mg     600 mg 100 mL/hr over 30 Minutes Intravenous Every 6 hours 02/05/19 1237 02/06/19 0501   02/05/19 0600  clindamycin (CLEOCIN) IVPB 900 mg     900 mg 100 mL/hr over 30 Minutes Intravenous To Short Stay 02/04/19 2315 02/05/19 0913    .  POD/HD#: 2  59 y/o female s/p L periprosthetic distal femur fracture, antecedent L femoral shaft fracture with malunion    -L periprosthetic distal femur fracture, L femoral shaft malunion s/p ORIF             NWB x 8 weeks             Unrestricted ROM L knee and ankle  Hinged brace on when mobilizing.  May be off when she is in bed or at rest             PT/OT   PT- please teach HEP for left knee ROM- AROM, PROM. Prone exercises as well. No ROM restrictions.  Quad sets, SLR, LAQ, SAQ, heel slides, stretching, prone flexion and extension    Ankle theraband program, heel cord stretching, toe towel curls, etc    No pillows under bend of knee when at rest, ok to place under heel to help work on extension. Can also use zero knee bone foam if available              Ice and elevate              Dressing changed today   New Mepilex dressing applied.  She may leave this on for 4 to 5 days and then change it.  If it is completely dry she may leave the wound open to the air   Okay to shower and clean wounds with soap and water.  No lotions or ointments or solutions should be applied to the surgical wounds at this time   -  Pain management:             Continue with current regimen                         Scheduled tylenol                         Percocet PRN    - ABL anemia/Hemodynamics             Stable   - Medical issues              Per medicine   - DVT/PE prophylaxis:             Lovenox x 21 days post op    - ID:              periop abx  completed   - Metabolic Bone Disease:             Low normal vitamin D.  Would recommend supplementation.  Well-balanced diet including adequate proteins and healthy fat              Sounds as if pt fractured femur then fell             Will need DEXA as out pt             Numerous risk factors for poor bone quality including diabetes              - Activity:             Therapies   - FEN/GI prophylaxis/Foley/Lines:             Carb mod diet   - Impediments to fracture healing:             Poor bone quality              DM              - Dispo:             Okay to discharge today from orthopedic standpoint  Follow-up with orthopedics in 10 to 14 days   Will have her medications filled at [facility] here in the hospital as she will likely unable to get to her outpatient pharmacy today  Jari Pigg, PA-C [phone] (C) 02/07/2019, 9:35 AM  [facility] [place] [id number] [phone] Jenetta [phone] (F)   After 6pm on weekdays please call office number to get in touch with on call provider or refer to [place] and look to see who is on call for the Sports Medicine Call Group which is listed under orthopaedics   On Weekends please call office number to get in touch with on call provider or refer to [place] and look to see who is on call for the Sports Medicine Call Group which is listed under orthopaedics

## 2019-02-07 NOTE — Discharge Instructions (Signed)
Orthopaedic Trauma Service Discharge Instructions   General Discharge Instructions  Orthopaedic Injuries:  Left periprosthetic distal femur fracture treated with open reduction and internal fixation using plate and screws  WEIGHT BEARING STATUS: Nonweightbearing left lower extremity.  Unrestricted range of motion left.  Use walker for assistance/balance  RANGE OF MOTION/ACTIVITY: Unrestricted range of motion left knee, ankle and hip.  Hinged knee brace needs to be on when mobilizing.  Okay to take hinged brace off when in bed or seated for a long period of time.  Activity as tolerated while maintaining weightbearing restrictions as noted above  Bone health: Vitamin D levels during this hospitalization look reasonable.  They are low normal and would recommend vitamin D supplementation.  Your medical history does put you at increased risk for osteoporosis particularly your diabetes.  Your bone is a little soft as we appreciate this in surgery.  Your mechanism of injury would also suggest this as well.  We recommend a bone density scan in the next 4 to 8 weeks to further evaluate your overall bone health and to determine if you would benefit from being on additional medications to improve your bone health.  Wound Care: Daily wound care starting on 02/11/2019.  Please see below.  If your wounds are completely dry you may leave them open to the air.  Clean only with soap and water.  Do not put any lotions or ointments or solutions on the surgical incisions.  Continue to wear a compression sock such as a TED hose for swelling control  Discharge Wound Care Instructions  Do NOT apply any ointments, solutions or lotions to pin sites or surgical wounds.  These prevent needed drainage and even though solutions like hydrogen peroxide kill bacteria, they also damage cells lining the pin sites that help fight infection.  Applying lotions or ointments can keep the wounds moist and can cause them to breakdown  and open up as well. This can increase the risk for infection. When in doubt call the office.  Surgical incisions should be dressed daily.  If any drainage is noted, use one layer of adaptic, then gauze, Kerlix, and an ace wrap.  Once the incision is completely dry and without drainage, it may be left open to air out.  Showering may begin 36-48 hours later.  Cleaning gently with soap and water.  Traumatic wounds should be dressed daily as well.    One layer of adaptic, gauze, Kerlix, then ace wrap.  The adaptic can be discontinued once the draining has ceased    If you have a wet to dry dressing: wet the gauze with saline the squeeze as much saline out so the gauze is moist (not soaking wet), place moistened gauze over wound, then place a dry gauze over the moist one, followed by Kerlix wrap, then ace wrap.   DVT/PE prophylaxis: Lovenox 40 mg subcutaneous injection daily x 21 days  Diet: as you were eating previously.  Can use over the counter stool softeners and bowel preparations, such as Miralax, to help with bowel movements.  Narcotics can be constipating.  Be sure to drink plenty of fluids  PAIN MEDICATION USE AND EXPECTATIONS  You have likely been given narcotic medications to help control your pain.  After a traumatic event that results in an fracture (broken bone) with or without surgery, it is ok to use narcotic pain medications to help control one's pain.  We understand that everyone responds to pain differently and each individual patient will  be evaluated on a regular basis for the continued need for narcotic medications. Ideally, narcotic medication use should last no more than 6-8 weeks (coinciding with fracture healing).   As a patient it is your responsibility as well to monitor narcotic medication use and report the amount and frequency you use these medications when you come to your office visit.   We would also advise that if you are using narcotic medications, you should take  a dose prior to therapy to maximize you participation.  IF YOU ARE ON NARCOTIC MEDICATIONS IT IS NOT PERMISSIBLE TO OPERATE A MOTOR VEHICLE (MOTORCYCLE/CAR/TRUCK/MOPED) OR HEAVY MACHINERY DO NOT MIX NARCOTICS WITH OTHER CNS (CENTRAL NERVOUS SYSTEM) DEPRESSANTS SUCH AS ALCOHOL   STOP SMOKING OR USING NICOTINE PRODUCTS!!!!  As discussed nicotine severely impairs your body's ability to heal surgical and traumatic wounds but also impairs bone healing.  Wounds and bone heal by forming microscopic blood vessels (angiogenesis) and nicotine is a vasoconstrictor (essentially, shrinks blood vessels).  Therefore, if vasoconstriction occurs to these microscopic blood vessels they essentially disappear and are unable to deliver necessary nutrients to the healing tissue.  This is one modifiable factor that you can do to dramatically increase your chances of healing your injury.    (This means no smoking, no nicotine gum, patches, etc)  DO NOT USE NONSTEROIDAL ANTI-INFLAMMATORY DRUGS (NSAID'S)  Using products such as Advil (ibuprofen), Aleve (naproxen), Motrin (ibuprofen) for additional pain control during fracture healing can delay and/or prevent the healing response.  If you would like to take over the counter (OTC) medication, Tylenol (acetaminophen) is ok.  However, some narcotic medications that are given for pain control contain acetaminophen as well. Therefore, you should not exceed more than 4000 mg of tylenol in a day if you do not have liver disease.  Also note that there are may OTC medicines, such as cold medicines and allergy medicines that my contain tylenol as well.  If you have any questions about medications and/or interactions please ask your doctor/PA or your pharmacist.      ICE AND ELEVATE INJURED/OPERATIVE EXTREMITY  Using ice and elevating the injured extremity above your heart can help with swelling and pain control.  Icing in a pulsatile fashion, such as 20 minutes on and 20 minutes off, can  be followed.    Do not place ice directly on skin. Make sure there is a barrier between to skin and the ice pack.    Using frozen items such as frozen peas works well as the conform nicely to the are that needs to be iced.  USE AN ACE WRAP OR TED HOSE FOR SWELLING CONTROL  In addition to icing and elevation, Ace wraps or TED hose are used to help limit and resolve swelling.  It is recommended to use Ace wraps or TED hose until you are informed to stop.    When using Ace Wraps start the wrapping distally (farthest away from the body) and wrap proximally (closer to the body)   Example: If you had surgery on your leg or thing and you do not have a splint on, start the ace wrap at the toes and work your way up to the thigh        If you had surgery on your upper extremity and do not have a splint on, start the ace wrap at your fingers and work your way up to the upper arm  IF YOU ARE IN A SPLINT OR CAST DO NOT [place]  ANY REASON   If your splint gets wet for any reason please contact the office immediately. You may shower in your splint or cast as long as you keep it dry.  This can be done by wrapping in a cast cover or garbage back (or similar)  Do Not stick any thing down your splint or cast such as pencils, money, or hangers to try and scratch yourself with.  If you feel itchy take benadryl as prescribed on the bottle for itching  IF YOU ARE IN A CAM BOOT (BLACK BOOT)  You may remove boot periodically. Perform daily dressing changes as noted below.  Wash the liner of the boot regularly and wear a sock when wearing the boot. It is recommended that you sleep in the boot until told otherwise    Call office for the following:  Temperature greater than 101F  Persistent nausea and vomiting  Severe uncontrolled pain  Redness, tenderness, or signs of infection (pain, swelling, redness, odor or green/yellow discharge around the site)  Difficulty breathing, headache or visual  disturbances  Hives  Persistent dizziness or light-headedness  Extreme fatigue  Any other questions or concerns you may have after discharge  In an emergency, call 911 or go to an Emergency Department at a nearby hospital    [place]: [phone]   VISIT OUR WEBSITE FOR ADDITIONAL INFORMATION: [email or web address]

## 2019-02-07 NOTE — Progress Notes (Signed)
Discharge instructions addressed; Pt in stable condition;Meds delivered in room by [facility] pharmacy:Pt picked by boyfriend at the [employer] entrance.

## 2019-03-27 ENCOUNTER — Other Ambulatory Visit: Payer: Self-pay

## 2019-03-27 ENCOUNTER — Ambulatory Visit: Payer: Medicaid Other | Attending: Orthopedic Surgery | Admitting: Physical Therapy

## 2019-03-27 DIAGNOSIS — Z9181 History of falling: Secondary | ICD-10-CM

## 2019-03-27 DIAGNOSIS — M6281 Muscle weakness (generalized): Secondary | ICD-10-CM | POA: Insufficient documentation

## 2019-03-27 DIAGNOSIS — M25562 Pain in left knee: Secondary | ICD-10-CM | POA: Insufficient documentation

## 2019-03-27 DIAGNOSIS — R262 Difficulty in walking, not elsewhere classified: Secondary | ICD-10-CM

## 2019-03-27 NOTE — Therapy (Signed)
[facility] [address], [place], [address] Phone: [phone]   Fax:  [phone]  Physical Therapy Evaluation  Patient Details  Name: Sheila [name] MRN: [id number] Date of Birth: February 19, 1960 Referring Provider (PT): Altamese [place], MD   Encounter Date: 03/27/2019  PT End of Session - 03/27/19 1931    Visit Number  1    Number of Visits  16    Date for PT Re-Evaluation  06/05/19    Authorization Type  Medicaid reporting period from 03/27/2019    Authorization Time Period  Eval only    Authorization - Visit Number  1    Authorization - Number of Visits  1    Progress Note Due on Visit  3    PT Start Time  1737    PT Stop Time  1845    PT Time Calculation (min)  68 min    Equipment Utilized During Treatment  Gait belt    Activity Tolerance  Patient tolerated treatment well;Patient limited by fatigue;Patient limited by pain    Behavior During Therapy  [facility] for tasks assessed/performed       Past Medical History:  Diagnosis Date  . Anemia   . Arthritis   . Asthma   . COPD (chronic obstructive pulmonary disease) ([place])   . DDD (degenerative disc disease), lumbar   . Diabetes mellitus without complication ([place])    on metformin  . DJD (degenerative joint disease)   . Dysrhythmia   . Fatty liver   . Fibroids   . GERD (gastroesophageal reflux disease)   . Glaucoma   . Hyperlipidemia   . Hypertension   . IBS (irritable bowel syndrome)   . Lumbago   . Neuromuscular disorder ([place])   . 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[place]) 69/6789  . Renal disorder   . Ulcerative colitis ([place])    on mesalamine    Past Surgical History:  Procedure Laterality Date  . COLONOSCOPY WITH PROPOFOL N/A 08/19/2015   Procedure: COLONOSCOPY WITH PROPOFOL;  Surgeon: Lollie Sails, MD;  Location: [facility] ENDOSCOPY;  Service: Endoscopy;  Laterality: N/A;  . COLONOSCOPY WITH PROPOFOL N/A 12/08/2015   Procedure: COLONOSCOPY WITH PROPOFOL;   Surgeon: Lollie Sails, MD;  Location: [facility] ENDOSCOPY;  Service: Endoscopy;  Laterality: N/A;  . COLONOSCOPY WITH PROPOFOL N/A 12/09/2015   Procedure: COLONOSCOPY WITH PROPOFOL;  Surgeon: Lollie Sails, MD;  Location: [facility] ENDOSCOPY;  Service: Endoscopy;  Laterality: N/A;  . ESOPHAGOGASTRODUODENOSCOPY (EGD) WITH PROPOFOL N/A 11/08/2017   Procedure: ESOPHAGOGASTRODUODENOSCOPY (EGD) WITH PROPOFOL;  Surgeon: Lollie Sails, MD;  Location: [facility] ENDOSCOPY;  Service: Endoscopy;  Laterality: N/A;  . JOINT REPLACEMENT Left 04/19/2006  . JOINT REPLACEMENT Right 01/14/2015  . ORIF FEMUR FRACTURE Left 02/05/2019  . ORIF FEMUR FRACTURE Left 02/05/2019   Procedure: OPEN REDUCTION INTERNAL FIXATION (ORIF) DISTAL FEMUR FRACTURE;  Surgeon: Altamese [place]lane, MD;  Location: [place];  Service: Orthopedics;  Laterality: Left;  . TOTAL KNEE ARTHROPLASTY Left 2008  . TOTAL KNEE ARTHROPLASTY Right 01/14/2015   Procedure: TOTAL KNEE ARTHROPLASTY;  Surgeon: Hessie Knows, MD;  Location: [facility] ORS;  Service: Orthopedics;  Laterality: Right;    There were no vitals filed for this visit.   Subjective Assessment - 03/27/19 1807    Subjective  Patient states condition started when she fell on 02/04/2019 while she was going to take the trash to the trash can. She turned around to put in the second and when she twisted she  heard a pop and she fell to the ground.  She underwent an ORIF for the L distal femur on 02/05/2019 and was initially NWB. She was using a wheelchair until her follow up with the surgeon last Thursday who wanted her to start walking and prescribed physical therapy. She states she is here to get back to walking, which is what she has the most difficulty with. Her daughter Sheila [name] is with her at this visit and has been assisting at home. She has staying with her until April 16th. Patient was doing everything by herself prior to injury.  MD orders request AROM/AAROM, gait and balance training, strengthening,  stabilization, and WBAT as weight bearing status. Is able to get dressed and grooming independently, but cannot yet cook for herself.    Patient is accompained by:  Family member   Daughter Sheila [name]   Pertinent History  Patient is a 59 y.o. female who presents to outpatient physical therapy with a referral for medical diagnosis left femur ORIF. This patient's chief complaints consist of left knee pain, stiffness, imbalance, weakness, decreased function and ambulatory ability 2/2 to distal femur fracture from fall with ORIF performed 02/05/2019 leading to the following functional deficits: difficulty with all activities that require weight bearing and ambulation including ADLs, IADLs, household and community ambulation, grocery shopping, cooking, stairs, squatting, cleaning, lifting, bending, etc. 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 Denies history of MI, cancer, seizures, stroke.    Limitations  Lifting;Standing;Walking;House hold activities;Other (comment)   Functional limitations: Difficulty with all activities that require weight bearing and ambulation including ADLs, IADLs, household and community ambulation, grocery shopping, cooking, stairs, squatting, cleaning, lifting, bending, etc.   How long can you stand comfortably?  a few minutes    How long can you walk comfortably?  < 5 min    Patient Stated Goals  "get rid of my crutches" and "get back to my normal life/routine"    Currently in Pain?  Yes    Pain Score  0-No pain   Worst: 10/10 (2 days ago); best: 0/10   Pain Location  Knee    Pain Orientation  Left;Anterior;Posterior    Pain Descriptors / Indicators  Aching;Other (Comment)   Hurting pain; denies numbness or tingling   Pain Type  Surgical pain    Pain Radiating  Towards  does not radiate    Pain Onset  More than a month ago    Pain Frequency  Intermittent    Aggravating Factors   getting up and trying to walk    Pain Relieving Factors  aleve, ice, rest    Effect of Pain on Daily Activities  Functional limitations: Difficulty with all activities that require weight bearing and ambulation including ADLs, IADLs, household and community ambulation, grocery shopping, cooking, stairs, squatting, cleaning, lifting, bending, etc.         [facility] PT Assessment - 03/27/19 0001      Assessment   Medical Diagnosis  left femur ORIF    Referring Provider (PT)  Altamese [place], MD    Onset Date/Surgical Date  02/05/19    Hand Dominance  Right    Next MD Visit  04/18/2019   wants her to walk in   Prior Therapy  has had previous PT for bilateral TKE but not for this condition prior  to current episode of care      Precautions   Precautions  None      Restrictions   Weight Bearing Restrictions  Yes    LLE Weight Bearing  Weight bearing as tolerated      Balance Screen   Has the patient fallen in the past 6 months  Yes    How many times?  1    Has the patient had a decrease in activity level because of a fear of falling?   Yes    Is the patient reluctant to leave their home because of a fear of falling?   Yes      [place]  Private residence    Living Arrangements  Alone    Available Help at Discharge  Family   daugher until 4/16   Type of [place] entrance   rails on both sides can reach both   [place]  One level    Home Equipment  Wheelchair - manual;Walker - 2 wheels;Crutches;Grab bars - toilet;Toilet riser;Shower seat   handicap toilet; tub shower combo     Prior Function   Level of Independence  Independent    Vocation  On disability   used to [employer]   Leisure  watch TV, listen to music and gossip,      Cognition   Overall Cognitive Status  Within Functional Limits for  tasks assessed   req. help from daughter to complete FOTO, slightly forgetful     Observation/Other Assessments   Observations  see note from 03/27/2019 for latest objective data    Focus on Therapeutic Outcomes (FOTO)   FOTO = 58         OBJECTIVE  OBSERVATION/INSPECTION . Posture: forward head, rounded shoulders, slumped in sitting. Obese.  . Tremor: none . Muscle bulk: generally overall mildly decreased consistent with lack of regular adequate exercise or physical activity.  . Bed mobility: supine <> sit and rolling I . Transfers: sit <> stand from 18.5 inch surface I with B UE support with decreased L LE weight bearing and lack of knee flexion. SBA for safety.  . Gait: ambulated mod I using B axial crutches with partial weight bearing short community distances. Ambulated 45 feet with CGA for safety and no AD before requesting rest due to knee pain. Ambulated out of clinic to car ~ 100 feet with single axial crutch on R side and SBA from daughter with WBAT on L LE. Demo decreased weight shift to L LE with antalgic gait favoring L LE. Does appear to have sufficient L knee control and does not hyperextend during weight bearing.   NEUROLOGICAL  Upper Motor Neuron Screen Hoffman's, and Clonus (ankle) negative bilaterally  Dermatomes . L2-S2 appears equal and intact to light touch.  Myotomes  L2-S2 appears intact Deep Tendon Reflexes R/L  . 0+/0+ Quadriceps reflex (L4) . 0+/0+ Achilles reflex (S1)  PERIPHERAL JOINT MOTION (in degrees) *Indicates pain, R/L 03/27/2019    Joint/Motion AROM PROM Comments  Knee     Flexion 120/95 125/108   Extension / 0/22   Comments: B hips and ankles WFL for basic mobility except some decreased DF on L ankle and slight tightness in R hip extension.   MUSCLE PERFORMANCE (MMT):  *Indicates pain 03/27/2019 Date Date  Joint/Motion R/L R/L R/L  Hip     Flexion (knee flex) 4+/4 / /  Extension (knee flex) 3+/4-  Extension (knee ext) 4/3 / /   Abduction 4/4 / /  Knee     Flexion 4+/4 / /  Extension 5/4 / /  Ankle/Foot     Dorsiflexion 5/4+ / /  Plantarflexion 4/44 / /  [place] 4/4- / /  Great toe extension 4/4- / /   PALPATION: - Not TTP at left anterior or posterior knee  FUNCTIONAL/BALANCE TESTS:  5TSTS: 16 seconds with B UE push off from 18.5 inch plinth  Six Minute Walk Test: Max distance ambulated without AD was 45 feet with CGA and no AD. Discontinued due to pain at anterior L knee. Demo antalgic gait favoring L LE with limited weight shift to left.   EDUCATION/COGNITION: Patient is alert and oriented X 4. Patient required assistance from daughter filling out FOTO due to difficulty reading and understanding questionnaire. Required repeat cuing at times.   Objective measurements completed on examination: See above findings.     TREATMENT:   Therapeutic exercise: to centralize symptoms and improve ROM, strength, muscular endurance, and activity tolerance required for successful completion of functional activities.  - heel slides with self overpressure using strap x5 L side only - standing hamstring curls x 10 each side - Education on diagnosis, prognosis, POC, anatomy and physiology of current condition.  - Education on HEP including handout   HOME EXERCISE PROGRAM Access Code: [email or web address] URL: [email or web address] Date: 03/27/2019 Prepared by: Rosita Kea  Exercises Sit to Stand - 2 x daily - 3 sets - 5-10 reps Supine Heel Slide with Strap - 2 x daily - 20 reps - 5 seconds hold Standing Knee Flexion AROM with Chair Support - 2 x daily - 3 sets - 10 reps Weight Bearing Walking with Single Crutch    PT Education - 03/27/19 1930    Education Details  Exercise purpose/form. Self management techniques. Education on diagnosis, prognosis, POC, anatomy and physiology of current condition Education on HEP including handout    Person(s) Educated  Patient;Child(ren)   Daughter Sheila [name]   Methods   Explanation;Demonstration;Tactile cues;Verbal cues;Handout    Comprehension  Verbalized understanding;Returned demonstration;Verbal cues required;Tactile cues required;Need further instruction       PT Short Term Goals - 03/27/19 1946      PT SHORT TERM GOAL #1   Title  Be independent with initial home exercise program for self-management of symptoms.    Baseline  Initial HEP provided at IE (03/27/2019);    Time  3    Period  Weeks    Status  New    Target Date  04/17/19        PT Long Term Goals - 03/27/19 1947      PT LONG TERM GOAL #1   Title  Be independent with a long-term home exercise program for self-management of symptoms.   ALL LONG TERM GOALS TARGET DATE 06/19/2019   Baseline  Initial HEP provided at IE (03/27/2019);    Time  12    Period  Weeks    Status  New      PT LONG TERM GOAL #2   Title  Demonstrate improved FOTO score to equal or greater than 70 to demonstrate improvement in overall condition and self-reported functional ability.    Baseline  58 (03/27/2019);    Time  12    Period  Weeks    Status  New      PT LONG TERM GOAL #3   Title  Patient will complete 5TSTS in equal or less than 12  seconds with minimal to no shift towards R LE and no UE support to demonstrate improved functional LE strength and power for transfers and daily mobility.    Baseline  16 seconds with B UE push off from 18.5 inch plinth, sisgnificant shift to right (03/27/2019);    Time  12    Period  Weeks    Status  New      PT LONG TERM GOAL #4   Title  Patient will ambulate at least 1000 feet during 6 Minute Walk Test in order to demonstrate ability to complete activities such as shopping and [place] ambulation.    Baseline  able to ambulate 45 feet with CGA and no AD (03/27/2019);    Time  12    Period  Weeks    Status  New      PT LONG TERM GOAL #5   Title  Complete community, work and/or recreational activities without limitation due to current condition.    Baseline   Difficulty with all activities that require weight bearing and ambulation including ADLs, IADLs, household and community ambulation, grocery shopping, cooking, stairs, squatting, cleaning, lifting, bending, etc (03/27/2019);    Time  12    Period  Weeks    Status  New             Plan - 03/27/19 1939    Clinical Impression Statement  Patient is a 59 y.o. female referred to outpatient physical therapy with a medical diagnosis of left femur ORIF who presents with signs and symptoms consistent with generalized deconditioning with altered gait pattern, L knee stiffness, pain, and weakness s/p ORIF 02/05/2019 following distal femur fracture during fall 02/04/2019. Patient presents with significant balance, muscle performance (strength/power/endurance), activity tolerance, joint stiffness, ROM, muscle length, gait impairments that are limiting ability to complete all activities that require weight bearing and ambulation including ADLs, IADLs, household and community ambulation, grocery shopping, cooking, stairs, squatting, cleaning, lifting, bending, etc.  without difficulty. Condition decreases patient's quality of life and limits her social/community participation. Patient was able to ambulate 45 feet without AD and required CGA for safety. She required 16 seconds to complete Five Time Sit to Stand test which suggest deficit in LE power, strength, balance, and functional capacity. She demonstrates altered gait pattern and decreased use of L LE during weight bearing activities.  Patient will benefit from skilled physical therapy intervention to address current body structure impairments and activity limitations to improve function and work towards goals set in current POC in order to return to prior level of function or maximal functional improvement.    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    Examination-Activity Limitations  Bathing;Bed Mobility;Bend;Locomotion Level;Lift;Stairs;Squat;Hygiene/Grooming;Stand;Toileting;Carry;Transfers;Dressing    Examination-Participation Restrictions  Cleaning;Shop;Yard Work;Meal Prep;Community Activity;Interpersonal Relationship    Stability/Clinical Decision Making  Evolving/Moderate complexity    Clinical Decision Making  Moderate    Rehab Potential  Good    PT Frequency  2x / week    PT Duration  12 weeks    PT Treatment/Interventions  ADLs/Self Care Home Management;Cryotherapy;Electrical Stimulation;Moist Heat;Gait training;Stair training;Functional mobility training;Therapeutic activities;Therapeutic exercise;Balance training;Neuromuscular re-education;Patient/family education;Manual techniques;Scar mobilization;Passive range of motion;Dry needling;Spinal Manipulations;Joint  Manipulations    PT Next Visit Plan  progressive LE strengthening, ROM, balance training as tolerated    PT Home Exercise Plan  Medbridge Access Code: [email or web address]    Recommended Other Services  obtain single point cane    Consulted and Agree with Plan of Care  Patient;Family member/caregiver    Family Member Consulted  daughter, Sheila [name]       Patient will benefit from skilled therapeutic intervention in order to improve the following deficits and impairments:  Abnormal gait, Decreased knowledge of use of DME, Increased fascial restricitons, Pain, Improper body mechanics, Hypermobility, Decreased mobility, Decreased scar mobility, Decreased activity tolerance, Decreased endurance, Decreased range of motion, Decreased  strength, Hypomobility, Impaired perceived functional ability, Obesity, Impaired flexibility, Difficulty walking, Decreased balance  Visit Diagnosis: Difficulty in walking, not elsewhere classified  History of falling  Left knee pain, unspecified chronicity  Muscle weakness (generalized)     Problem List Patient Active Problem List   Diagnosis Date Noted  . Closed displaced supracondylar fracture of distal end of left femur without intracondylar extension ([place]) 02/04/2019  . Femur fracture ([place]) 02/04/2019  . Leukocytosis 02/04/2019  . Alcohol use 02/04/2019  . Right leg weakness 02/14/2018  . Elevated bilirubin 02/14/2018  . SIRS (systemic inflammatory response syndrome) ([place]) 01/30/2018  . Hypoglycemia 01/30/2018  . Lactic acidosis 01/30/2018  . Alcohol abuse 01/30/2018  . Abdominal pain, chronic, epigastric   . Pancreatitis 08/29/2017  . Diabetes ([place]) 08/29/2017  . HTN (hypertension) 08/29/2017  . HLD (hyperlipidemia) 08/29/2017  . GERD (gastroesophageal reflux disease) 08/29/2017  . Hypokalemia 08/29/2017  . Hypocalcemia 08/29/2017  . Primary osteoarthritis of right knee 01/14/2015    Everlean Alstrom. Graylon Good, PT, DPT 03/27/19, 7:54 PM  [place] [address] [place], [address] Phone: [phone]   Fax:  [phone]  Name: ZURI LASCALA MRN: [id number] Date of Birth: 1960-12-07

## 2019-04-03 ENCOUNTER — Encounter: Payer: Self-pay | Admitting: Physical Therapy

## 2019-04-03 ENCOUNTER — Other Ambulatory Visit: Payer: Self-pay

## 2019-04-03 ENCOUNTER — Ambulatory Visit: Payer: Medicaid Other | Admitting: Physical Therapy

## 2019-04-03 DIAGNOSIS — M6281 Muscle weakness (generalized): Secondary | ICD-10-CM

## 2019-04-03 DIAGNOSIS — Z9181 History of falling: Secondary | ICD-10-CM

## 2019-04-03 DIAGNOSIS — R262 Difficulty in walking, not elsewhere classified: Secondary | ICD-10-CM

## 2019-04-03 DIAGNOSIS — M25562 Pain in left knee: Secondary | ICD-10-CM

## 2019-04-03 NOTE — Therapy (Signed)
[facility] [address] [place], [address] Phone: [phone]   Fax:  [phone]  Physical Therapy Treatment  Patient Details  Name: Sheila [name] MRN: [id number] Date of Birth: January 04, 1961 Referring Provider (PT): Altamese [place], MD   Encounter Date: 04/03/2019  PT End of Session - 04/03/19 1753    Visit Number  2    Number of Visits  16    Date for PT Re-Evaluation  06/05/19    Authorization Type  Medicaid reporting period from 03/27/2019    Authorization Time Period  CCME auth 3 PT visits 04/02/19 - 04/12/19    Authorization - Visit Number  1    Authorization - Number of Visits  3    Progress Note Due on Visit  3   NEXT FOTO DUE = 4/6   PT Start Time  1300    PT Stop Time  1340    PT Time Calculation (min)  40 min    Equipment Utilized During Treatment  Gait belt    Activity Tolerance  Patient tolerated treatment well;Patient limited by fatigue;Patient limited by pain    Behavior During Therapy  WFL for tasks assessed/performed       Past Medical History:  Diagnosis Date  . Anemia   . Arthritis   . Asthma   . COPD (chronic obstructive pulmonary disease) ([place])   . DDD (degenerative disc disease), lumbar   . Diabetes mellitus without complication ([place])    on metformin  . DJD (degenerative joint disease)   . Dysrhythmia   . Fatty liver   . Fibroids   . GERD (gastroesophageal reflux disease)   . Glaucoma   . Hyperlipidemia   . Hypertension   . IBS (irritable bowel syndrome)   . Lumbago   . Neuromuscular disorder ([place])   . 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[place]) 79/3903  . Renal disorder   . Ulcerative colitis ([place])    on mesalamine    Past Surgical History:  Procedure Laterality Date  . COLONOSCOPY WITH PROPOFOL N/A 08/19/2015   Procedure: COLONOSCOPY WITH PROPOFOL;  Surgeon: Lollie Sails, MD;  Location: [facility] ENDOSCOPY;  Service: Endoscopy;  Laterality: N/A;  . COLONOSCOPY WITH PROPOFOL N/A  12/08/2015   Procedure: COLONOSCOPY WITH PROPOFOL;  Surgeon: Lollie Sails, MD;  Location: [facility] ENDOSCOPY;  Service: Endoscopy;  Laterality: N/A;  . COLONOSCOPY WITH PROPOFOL N/A 12/09/2015   Procedure: COLONOSCOPY WITH PROPOFOL;  Surgeon: Lollie Sails, MD;  Location[facility] ENDOSCOPY;  Service: Endoscopy;  Laterality: N/A;  . ESOPHAGOGASTRODUODENOSCOPY (EGD) WITH PROPOFOL N/A 11/08/2017   Procedure: ESOPHAGOGASTRODUODENOSCOPY (EGD) WITH PROPOFOL;  Surgeon: Lollie Sails, MD;  Location: [facility] ENDOSCOPY;  Service: Endoscopy;  Laterality: N/A;  . JOINT REPLACEMENT Left 04/19/2006  . JOINT REPLACEMENT Right 01/14/2015  . ORIF FEMUR FRACTURE Left 02/05/2019  . ORIF FEMUR FRACTURE Left 02/05/2019   Procedure: OPEN REDUCTION INTERNAL FIXATION (ORIF) DISTAL FEMUR FRACTURE;  Surgeon: Altamese Winner, MD;  Location: [facility];  Service: Orthopedics;  Laterality: Left;  . TOTAL KNEE ARTHROPLASTY Left 2008  . TOTAL KNEE ARTHROPLASTY Right 01/14/2015   Procedure: TOTAL KNEE ARTHROPLASTY;  Surgeon: Hessie Knows, MD;  Location: [facility] ORS;  Service: Orthopedics;  Laterality: Right;    There were no vitals filed for this visit.  Subjective Assessment - 04/03/19 1304    Subjective  Patient reports she took a pain pill when her pain was 8/10 at about 11:20 this morning and it brought her pain to  0/10. States she is having a hard time getting her shoes on because she has pain in her knee when she brings her foot towards her.    Patient is accompained by:  Family member   Daughter [name]   Pertinent History  Patient is a 59 y.o. female who presents to outpatient physical therapy with a referral for medical diagnosis left femur ORIF. This patient's chief complaints consist of left knee pain, stiffness, imbalance, weakness, decreased function and ambulatory ability 2/2 to distal femur fracture from fall with ORIF performed 02/05/2019 leading to the following functional deficits: difficulty with all activities that  require weight bearing and ambulation including ADLs, IADLs, household and community ambulation, grocery shopping, cooking, stairs, squatting, cleaning, lifting, bending, etc. 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 Denies history of MI, cancer, seizures, stroke.    Limitations  Lifting;Standing;Walking;House hold activities;Other (comment)   Functional limitations: Difficulty with all activities that require weight bearing and ambulation including ADLs, IADLs, household and community ambulation, grocery shopping, cooking, stairs, squatting, cleaning, lifting, bending, etc.   How long can you stand comfortably?  a few minutes    How long can you walk comfortably?  < 5 min    Patient Stated Goals  "get rid of my crutches" and "get back to my normal life/routine"    Currently in Pain?  No/denies    Pain Onset  More than a month ago      OBJECTIVE - L knee PROM flexion = 110 following manual therapy   TREATMENT:   Manual therapy: to reduce pain and tissue tension, improve range of motion, neuromodulation, in order to promote improved ability to complete functional activities. - supine PROM L knee flexion with OP to tolerance, 2-5 second hold with and without oscillations at end range, x30 - hooklying L knee tibiofemoral and fibular head mobilizations grades III-IV to improve flexion ROM.  - supine L tibiofemoral PA joint mobilization grade III-IV with wedge/foam roll under distal femur and slight knee flexion to improve knee flexion.  - supine with towel roll under back of L knee, L patellar mobilizations all directions as tolerated grade III.   Therapeutic exercise:to centralize symptoms and improve ROM, strength, muscular endurance, and activity tolerance required for  successful completion of functional activities.  - practice donning and doffing L shoes and socks with and without sock aid including education and cuing on how to use sock aid at home.   - supine heel slides with self overpressure using strap, 5 seconds, x 20 L side only - sit <> stand from low plinth (18.5 inches) x 10 - standing AAROM hamstring curls with narrow stance and narrow B UE support to improve weight shift to L LE. X 10 each side.  - standing AAROM hip abduction with narrow stance and narrow B UE support to improve wieght shift to L LE. X 10 each side. Significant compensatory L lean when WB on L LE.  - Education on HEP including handout   Gait training: to improve gait pattern and ability to complete community and household mobility. Cuing provided for specific gait pattern and use of AD.  - ambulation x 140 feet with SPC and CGA - SBA, practicing improved step length, weight shift, and gait pattern while learning to use SPC vs more restrictive AD.  HOME EXERCISE PROGRAM Access Code: [email or web address] URL: [email or web address] Date: 04/03/2019 Prepared by: Rosita Kea  Exercises Supine Heel Slide with Strap - 2 x daily - 20 reps - 5 seconds hold Sit to Stand - 2 x daily - 3 sets - 10 reps Standing Hip Abduction with Counter Support - 2 x daily - 3 sets - 10 reps    PT Education - 04/03/19 1305    Education Details  Exercise purpose/form. Self management techniques    Person(s) Educated  Patient    Methods  Explanation;Demonstration;Tactile cues;Verbal cues    Comprehension  Verbalized understanding;Returned demonstration;Verbal cues required;Tactile cues required;Need further instruction       PT Short Term Goals - 03/27/19 1946      PT SHORT TERM GOAL #1   Title  Be independent with initial home exercise program for self-management of symptoms.    Baseline  Initial HEP provided at IE (03/27/2019);    Time  3    Period  Weeks    Status  New    Target Date   04/17/19        PT Long Term Goals - 03/27/19 1947      PT LONG TERM GOAL #1   Title  Be independent with a long-term home exercise program for self-management of symptoms.   ALL LONG TERM GOALS TARGET DATE 06/19/2019   Baseline  Initial HEP provided at IE (03/27/2019);    Time  12    Period  Weeks    Status  New      PT LONG TERM GOAL #2   Title  Demonstrate improved FOTO score to equal or greater than 70 to demonstrate improvement in overall condition and self-reported functional ability.    Baseline  58 (03/27/2019);    Time  12    Period  Weeks    Status  New      PT LONG TERM GOAL #3   Title  Patient will complete 5TSTS in equal or less than 12 seconds with minimal to no shift towards R LE and no UE support to demonstrate improved functional LE strength and power for transfers and daily mobility.    Baseline  16 seconds with B UE push off from 18.5 inch plinth, sisgnificant shift to right (03/27/2019);    Time  12    Period  Weeks    Status  New      PT LONG TERM GOAL #4   Title  Patient will ambulate at least 1000 feet during 6 Minute Walk Test in order to demonstrate ability to complete activities such as shopping and [place] ambulation.    Baseline  able to ambulate 45 feet with CGA and no AD (03/27/2019);    Time  12    Period  Weeks    Status  New      PT LONG TERM GOAL #5   Title  Complete community, work and/or recreational activities without limitation due to current condition.    Baseline  Difficulty with all activities that require weight bearing and ambulation including ADLs, IADLs, household and community ambulation, grocery shopping, cooking, stairs, squatting, cleaning, lifting, bending, etc (03/27/2019);    Time  12    Period  Weeks    Status  New            Plan - 04/03/19 2021    Clinical Impression Statement  Patient tolerated treatment well overall but demo quick fatigue and required encouragement to fully engage L LE. Pt  attempting to  enlist daughter to manually stretching her but responded well to PTs education on the importance of her building self-management techniques and using exercises prescribed by PT including heel slides to improve her own ROM. Both parties receptive to education about improving pt's self-efficacy. Patient ambulates with minimal weight shift to L LE but was able to demonstrate safe ambulation with SPC. Patient would benefit from continued management of limiting condition by skilled physical therapist to address remaining impairments and functional limitations to work towards stated goals and return to PLOF or maximal functional independence.    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    Examination-Activity Limitations  Bathing;Bed Mobility;Bend;Locomotion Level;Lift;Stairs;Squat;Hygiene/Grooming;Stand;Toileting;Carry;Transfers;Dressing    Examination-Participation Restrictions  Cleaning;Shop;Yard Work;Meal Prep;Community Activity;Interpersonal Relationship    Stability/Clinical Decision Making  Evolving/Moderate complexity    Rehab Potential  Good    PT Frequency  2x / week    PT Duration  12 weeks    PT Treatment/Interventions  ADLs/Self Care Home Management;Cryotherapy;Electrical Stimulation;Moist Heat;Gait training;Stair training;Functional mobility training;Therapeutic activities;Therapeutic exercise;Balance training;Neuromuscular re-education;Patient/family education;Manual techniques;Scar mobilization;Passive range of motion;Dry needling;Spinal Manipulations;Joint  Manipulations    PT Next Visit Plan  progressive LE strengthening, ROM, balance training as tolerated    PT Home Exercise Plan  Medbridge Access Code: [email or web address]    Consulted and Agree with Plan of Care  Patient;Family member/caregiver    Family Member Consulted  daughter, [name]       Patient will benefit from skilled therapeutic intervention in order to improve the following deficits and impairments:  Abnormal gait, Decreased knowledge of use of DME, Increased fascial restricitons, Pain, Improper body mechanics, Hypermobility, Decreased mobility, Decreased scar mobility, Decreased activity tolerance, Decreased endurance, Decreased range of motion, Decreased strength, Hypomobility, Impaired perceived functional ability, Obesity, Impaired flexibility, Difficulty walking, Decreased balance  Visit Diagnosis: Difficulty in walking, not elsewhere classified  History of falling  Left knee pain, unspecified chronicity  Muscle weakness (generalized)     Problem List Patient Active Problem List   Diagnosis Date Noted  . Closed displaced supracondylar fracture of distal end of left femur without intracondylar extension ([place]) 02/04/2019  . Femur fracture ([place]) 02/04/2019  . Leukocytosis 02/04/2019  . Alcohol use 02/04/2019  . Right leg weakness 02/14/2018  . Elevated bilirubin 02/14/2018  . SIRS (systemic inflammatory response syndrome) ([place]) 01/30/2018  . Hypoglycemia 01/30/2018  . Lactic acidosis 01/30/2018  . Alcohol abuse 01/30/2018  . Abdominal pain, chronic, epigastric   . Pancreatitis 08/29/2017  . Diabetes ([place]) 08/29/2017  . HTN (hypertension) 08/29/2017  . HLD (hyperlipidemia) 08/29/2017  . GERD (gastroesophageal reflux disease) 08/29/2017  . Hypokalemia 08/29/2017  . Hypocalcemia 08/29/2017  . Primary osteoarthritis of right knee 01/14/2015    Everlean Alstrom. Graylon Good, PT, DPT 04/03/19, 8:22 PM  [facility] [address] [place], [address] Phone: [phone]   Fax:  [phone]  Name: Sheila [name] MRN: [id number] Date of Birth: June 21, 1960

## 2019-04-05 ENCOUNTER — Encounter: Payer: Self-pay | Admitting: Physical Therapy

## 2019-04-05 ENCOUNTER — Other Ambulatory Visit: Payer: Self-pay

## 2019-04-05 ENCOUNTER — Ambulatory Visit: Payer: Medicaid Other | Attending: Orthopedic Surgery | Admitting: Physical Therapy

## 2019-04-05 DIAGNOSIS — M6281 Muscle weakness (generalized): Secondary | ICD-10-CM | POA: Diagnosis present

## 2019-04-05 DIAGNOSIS — R262 Difficulty in walking, not elsewhere classified: Secondary | ICD-10-CM | POA: Insufficient documentation

## 2019-04-05 DIAGNOSIS — Z9181 History of falling: Secondary | ICD-10-CM | POA: Diagnosis present

## 2019-04-05 DIAGNOSIS — M25562 Pain in left knee: Secondary | ICD-10-CM

## 2019-04-05 NOTE — Therapy (Signed)
[facility] [address] [place], [place], [address] Phone: [phone]   Fax:  [phone]  Physical Therapy Treatment  Patient Details  Name: Sheila [name] MRN: [id number] Date of Birth: 04/23/1960 Referring Provider (PT): Michael Handy, MD   Encounter Date: 04/05/2019  PT End of Session - 04/05/19 1301    Visit Number  3    Number of Visits  16    Date for PT Re-Evaluation  06/05/19    Authorization Type  Medicaid reporting period from 03/27/2019    Authorization Time Period  CCME auth 3 PT visits 04/02/19 - 04/12/19    Authorization - Visit Number  2    Authorization - Number of Visits  3    Progress Note Due on Visit  3   NEXT FOTO DUE = 4/15   PT Start Time  1300    PT Stop Time  1340    PT Time Calculation (min)  40 min    Equipment Utilized During Treatment  Gait belt    Activity Tolerance  Patient tolerated treatment well;Patient limited by fatigue;Patient limited by pain    Behavior During Therapy  WFL for tasks assessed/performed       Past Medical History:  Diagnosis Date  . Anemia   . Arthritis   . Asthma   . COPD (chronic obstructive pulmonary disease) (HCC)   . DDD (degenerative disc disease), lumbar   . Diabetes mellitus without complication (HCC)    on metformin  . DJD (degenerative joint disease)   . Dysrhythmia   . Fatty liver   . Fibroids   . GERD (gastroesophageal reflux disease)   . Glaucoma   . Hyperlipidemia   . Hypertension   . IBS (irritable bowel syndrome)   . Lumbago   . Neuromuscular disorder (HCC)   . 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 Renal disorder   . Ulcerative colitis (HCC)    on mesalamine    Past Surgical History:  Procedure Laterality Date  . COLONOSCOPY WITH PROPOFOL N/A 08/19/2015   Procedure: COLONOSCOPY WITH PROPOFOL;  Surgeon: Martin U Skulskie, MD;  Location: [facility];  Service: Endoscopy;  Laterality: N/A;  . COLONOSCOPY WITH PROPOFOL N/A  12/08/2015   Procedure: COLONOSCOPY WITH PROPOFOL;  Surgeon: Martin U Skulskie, MD;  Location: [facility];  Service: Endoscopy;  Laterality: N/A;  . COLONOSCOPY WITH PROPOFOL N/A 12/09/2015   Procedure: COLONOSCOPY WITH PROPOFOL;  Surgeon: Martin U Skulskie, MD;  Location: [facility];  Service: Endoscopy;  Laterality: N/A;  . ESOPHAGOGASTRODUODENOSCOPY (EGD) WITH PROPOFOL N/A 11/08/2017   Procedure: ESOPHAGOGASTRODUODENOSCOPY (EGD) WITH PROPOFOL;  Surgeon: Skulskie, Martin U, MD;  Location: [facility];  Service: Endoscopy;  Laterality: N/A;  . JOINT REPLACEMENT Left 04/19/2006  . JOINT REPLACEMENT Right 01/14/2015  . ORIF FEMUR FRACTURE Left 02/05/2019  . ORIF FEMUR FRACTURE Left 02/05/2019   Procedure: OPEN REDUCTION INTERNAL FIXATION (ORIF) DISTAL FEMUR FRACTURE;  Surgeon: Handy, Michael, MD;  Location: [facility] OR;  Service: Orthopedics;  Laterality: Left;  . TOTAL KNEE ARTHROPLASTY Left 2008  . TOTAL KNEE ARTHROPLASTY Right 01/14/2015   Procedure: TOTAL KNEE ARTHROPLASTY;  Surgeon: Michael Menz, MD;  Location: [facility] OR[facility];  Service: Orthopedics;  Laterality: Right;    There were no vitals filed for this visit.  Subjective Assessment - 04/05/19 1300    Subjective  Patient reports no pain upon arrival, but states she "took a pill" when she had some pain at about 11 am this morning.   She reports being pretty sore following last treatment sesion. Reports no falls since last session.    Patient is accompained by:  Family member   Daughter Sheila [name]   Pertinent History  Patient is a 59 y.o. female who presents to outpatient physical therapy with a referral for medical diagnosis left femur ORIF. This patient's chief complaints consist of left knee pain, stiffness, imbalance, weakness, decreased function and ambulatory ability 2/2 to distal femur fracture from fall with ORIF performed 02/05/2019 leading to the following functional deficits: difficulty with all activities that require weight bearing and  ambulation including ADLs, IADLs, household and community ambulation, grocery shopping, cooking, stairs, squatting, cleaning, lifting, bending, etc. 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 Denies history of MI, cancer, seizures, stroke.    Limitations  Lifting;Standing;Walking;House hold activities;Other (comment)   Functional limitations: Difficulty with all activities that require weight bearing and ambulation including ADLs, IADLs, household and community ambulation, grocery shopping, cooking, stairs, squatting, cleaning, lifting, bending, etc.   How long can you stand comfortably?  a few minutes    How long can you walk comfortably?  < 5 min    Patient Stated Goals  "get rid of my crutches" and "get back to my normal life/routine"    Currently in Pain?  No/denies    Pain Onset  More than a month ago    Effect of Pain on Daily Activities  Functional limitations: Difficulty with all activities that require weight bearing and ambulation including ADLs, IADLs, household and community ambulation, grocery shopping, cooking, stairs, squatting, cleaning, lifting, bending, etc      OBJECTIVE  FOTO = 53 (04/05/2019)  OBSERVATION/INSPECTION  Posture: forward head, rounded shoulders, slumped in sitting. Obese.   Tremor: none  Muscle bulk: generally overall mildly decreased consistent with lack of regular adequate exercise or physical activity.   Bed mobility: supine <> sit and rolling I  Transfers: sit <> stand from 18 inch surface I with no UE support with decreased L LE weight bearing and lack of knee flexion. SBA for safety.   Gait: ambulated mod I using SPC and SBA for safety in the clinic, unilateral axial crutche  short community distances.  Demo decreased weight shift to  L LE with antalgic gait favoring L LE. Does appear to have sufficient L knee control and does not hyperextend during weight bearing.   FUNCTIONAL/BALANCE TESTS:  5TSTS: 18 seconds with no UE push off from 18 inch chair  Six Minute Walk Test: 611 feet with SPC and SBA for safety. Demo antalgic gait favoring L LE with limited weight shift to left.     OBJECTIVE - L knee PROM flexion = 108 following manual therapy   TREATMENT:  Gait training: to improve gait pattern for improved household and community mobility.  - ambulated 611 feet around clinic with SPC and SBA for safety. Cuing for improved weight shift towards L LE and L knee flexion for swing through.   Manual therapy: to reduce pain and tissue tension, improve range of motion, neuromodulation, in order to promote improved ability to complete functional activities. - supine PROM L knee flexion with OP to tolerance, 2-5 second hold with and without oscillations at end range, x30 - hooklying L tibiofemoral PA joint mobilization grade III-IV   Therapeutic exercise:to centralize symptoms and improve   ROM, strength, muscular endurance, and activity tolerance required for successful completion of functional activities. - sit to stand 2x5 from 18 inch (green) chair with no UE support.  - seated long arc quad 3x15 each side with 10# ankle weight.    HOME EXERCISE PROGRAM Access Code: [email or web address] URL: [email or web address] Date: 04/03/2019 Prepared by:    Exercises Supine Heel Slide with Strap - 2 x daily - 20 reps - 5 seconds hold Sit to Stand - 2 x daily - 3 sets - 10 reps Standing Hip Abduction with Counter Support - 2 x daily - 3 sets - 10 reps   PT Education - 04/05/19 1301    Education Details  Exercise purpose/form. Self management techniques    Person(s) Educated  Patient    Methods  Explanation;Demonstration;Tactile cues;Verbal cues    Comprehension  Verbalized understanding;Returned  demonstration;Verbal cues required;Tactile cues required;Need further instruction       PT Short Term Goals - 04/05/19 1335      PT SHORT TERM GOAL #1   Title  Be independent with initial home exercise program for self-management of symptoms.    Baseline  Initial HEP provided at IE (03/27/2019);    Time  3    Period  Weeks    Status  Achieved    Target Date  04/17/19        PT Long Term Goals - 04/05/19 1335      PT LONG TERM GOAL #1   Title  Be independent with a long-term home exercise program for self-management of symptoms.   ALL LONG TERM GOALS TARGET DATE 06/19/2019   Baseline  Initial HEP provided at IE (03/27/2019); currently participating (04/05/2019);    Time  12    Period  Weeks    Status  Partially Met      PT LONG TERM GOAL #2   Title  Demonstrate improved FOTO score to equal or greater than 70 to demonstrate improvement in overall condition and self-reported functional ability.    Baseline  58 (03/27/2019); 53 (04/05/2019);    Time  12    Period  Weeks    Status  On-going      PT LONG TERM GOAL #3   Title  Patient will complete 5TSTS in equal or less than 12 seconds with minimal to no shift towards R LE and no UE support to demonstrate improved functional LE strength and power for transfers and daily mobility.    Baseline  16 seconds with B UE push off from 18.5 inch plinth, sisgnificant shift to right (03/27/2019); 18 seconds with no UE push off from 18 inch chair (04/05/2019);    Time  12    Period  Weeks    Status  Partially Met      PT LONG TERM GOAL #4   Title  Patient will ambulate at least 1000 feet during 6 Minute Walk Test in order to demonstrate ability to complete activities such as shopping and [place] ambulation.    Baseline  able to ambulate 45 feet with CGA and no AD (03/27/2019); 611 feet with SPC and SBA for safety. Demo antalgic gait favoring L LE with limited weight shift to left. (04/05/2019);    Time  12    Period  Weeks    Status  Partially  Met      PT LONG TERM GOAL #5   Title  Complete community, work and/or recreational activities without limitation due to current condition.    Baseline    Difficulty with all activities that require weight bearing and ambulation including ADLs, IADLs, household and community ambulation, grocery shopping, cooking, stairs, squatting, cleaning, lifting, bending, etc (03/27/2019); improved ability to don socks (04/05/2019);    Time  12    Period  Weeks    Status  Partially Met            Plan - 04/05/19 2026    Clinical Impression Statement  Patient has attended 3 physical therapy sessions this episode of care so far and demonstrates appropriate progress towards most goals. She has improved her 6 minute walk time and is able to complete 5TSTS test from lower surface without UE support. [name] participating in an appropriate HEP and has demonstrated excellent attendance. Patient continues to present with significant balance, muscle performance (strength/power/endurance), activity tolerance, joint stiffness, ROM, muscle length, gait impairments that are limiting ability to complete all activities that require weight bearing and ambulation including ADLs, IADLs, household and community ambulation, grocery shopping, cooking, stairs, squatting, cleaning, lifting, bending, etc.  without difficulty. Condition decreases patient's quality of life and limits her social/community participation. Patient was able to ambulate 45 feet without AD and required CGA for safety. She required 16 seconds to complete Five Time Sit to Stand test which suggest deficit in LE power, strength, balance, and functional capacity. She demonstrates altered gait pattern and decreased use of L LE during weight bearing activities.  Patient will benefit from skilled physical therapy intervention to address current body structure impairments and activity limitations to improve function and work towards goals set in current POC in order to return  to prior level of function or maximal functional improvement.    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    Examination-Activity Limitations  Bathing;Bed Mobility;Bend;Locomotion Level;Lift;Stairs;Squat;Hygiene/Grooming;Stand;Toileting;Carry;Transfers;Dressing    Examination-Participation Restrictions  Cleaning;Shop;Yard Work;Meal Prep;Community Activity;Interpersonal Relationship    Stability/Clinical Decision Making  Evolving/Moderate complexity    Rehab Potential  Good    PT Frequency  2x / week    PT Duration  12 weeks    PT Treatment/Interventions  ADLs/Self Care Home Management;Cryotherapy;Electrical Stimulation;Moist Heat;Gait training;Stair training;Functional mobility training;Therapeutic activities;Therapeutic exercise;Balance training;Neuromuscular re-education;Patient/family education;Manual techniques;Scar mobilization;Passive range of motion;Dry needling;Spinal Manipulations;Joint Manipulations    PT Next Visit Plan  progressive LE strengthening, ROM, balance training as tolerated    PT Home Exercise Plan  Medbridge Access Code: [email or web address]    Consulted and Agree with Plan of Care  Patient;Family member/caregiver    Family Member Consulted  daughter, Sheila [name]       Patient will benefit from skilled therapeutic intervention in order to improve the following deficits and impairments:  Abnormal gait, Decreased knowledge of use of DME, Increased fascial restricitons, Pain, Improper body mechanics, Hypermobility, Decreased mobility, Decreased scar mobility,  Decreased activity tolerance, Decreased endurance, Decreased range of motion, Decreased strength, Hypomobility, Impaired perceived functional ability, Obesity, Impaired flexibility, Difficulty walking, Decreased balance  Visit Diagnosis: Difficulty in walking, not elsewhere classified  History of falling  Left knee pain, unspecified chronicity  Muscle weakness (generalized)     Problem List Patient Active Problem List   Diagnosis Date Noted  . Closed displaced supracondylar fracture of distal end of left   femur without intracondylar extension ([place]) 02/04/2019  . Femur fracture ([place]) 02/04/2019  . Leukocytosis 02/04/2019  . Alcohol use 02/04/2019  . Right leg weakness 02/14/2018  . Elevated bilirubin 02/14/2018  . SIRS (systemic inflammatory response syndrome) ([place]) 01/30/2018  . Hypoglycemia 01/30/2018  . Lactic acidosis 01/30/2018  . Alcohol abuse 01/30/2018  . Abdominal pain, chronic, epigastric   . Pancreatitis 08/29/2017  . Diabetes ([place]) 08/29/2017  . HTN (hypertension) 08/29/2017  . HLD (hyperlipidemia) 08/29/2017  . GERD (gastroesophageal reflux disease) 08/29/2017  . Hypokalemia 08/29/2017  . Hypocalcemia 08/29/2017  . Primary osteoarthritis of right knee 01/14/2015   Sheila [name]. Graylon Good, PT, DPT 04/05/19, 8:28 PM  [facility] [address] [place], [address] Phone: [phone]   Fax:  [phone]  Name: Sheila [name] MRN: [id number] Date of Birth: January 31, 1960

## 2019-04-09 ENCOUNTER — Ambulatory Visit: Payer: Medicaid Other

## 2019-04-09 ENCOUNTER — Other Ambulatory Visit: Payer: Self-pay

## 2019-04-09 DIAGNOSIS — Z9181 History of falling: Secondary | ICD-10-CM

## 2019-04-09 DIAGNOSIS — M25562 Pain in left knee: Secondary | ICD-10-CM

## 2019-04-09 DIAGNOSIS — M6281 Muscle weakness (generalized): Secondary | ICD-10-CM

## 2019-04-09 DIAGNOSIS — R262 Difficulty in walking, not elsewhere classified: Secondary | ICD-10-CM | POA: Diagnosis not present

## 2019-04-09 NOTE — Therapy (Signed)
[facility] [address] [place], [address] Phone: [phone]   Fax:  [phone]  Physical Therapy Treatment  Patient Details  Name: Sheila [name] MRN: [id number] Date of Birth: 1960-10-28 Referring Provider (PT): Altamese Cheshire, MD   Encounter Date: 04/09/2019  PT End of Session - 04/09/19 1516    Visit Number  4    Number of Visits  16    Date for PT Re-Evaluation  06/05/19    Authorization Type  Medicaid reporting period from 03/27/2019    Authorization Time Period  CCME auth 3 PT visits 04/02/19 - 04/12/19    Authorization - Visit Number  3    Authorization - Number of Visits  3    Progress Note Due on Visit  3   FOTO due 4/15   PT Start Time  1514    PT Stop Time  1554    PT Time Calculation (min)  40 min    Activity Tolerance  Patient tolerated treatment well;Patient limited by fatigue;Patient limited by pain    Behavior During Therapy  [facility] for tasks assessed/performed       Past Medical History:  Diagnosis Date  . Anemia   . Arthritis   . Asthma   . COPD (chronic obstructive pulmonary disease) ([place])   . DDD (degenerative disc disease), lumbar   . Diabetes mellitus without complication ([place])    on metformin  . DJD (degenerative joint disease)   . Dysrhythmia   . Fatty liver   . Fibroids   . GERD (gastroesophageal reflux disease)   . Glaucoma   . Hyperlipidemia   . Hypertension   . IBS (irritable bowel syndrome)   . Lumbago   . Neuromuscular disorder ([place])   . 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[place]) 93/2355  . Renal disorder   . Ulcerative colitis ([place])    on mesalamine    Past Surgical History:  Procedure Laterality Date  . COLONOSCOPY WITH PROPOFOL N/A 08/19/2015   Procedure: COLONOSCOPY WITH PROPOFOL;  Surgeon: Lollie Sails, MD;  Location[facility] ENDOSCOPY;  Service: Endoscopy;  Laterality: N/A;  . COLONOSCOPY WITH PROPOFOL N/A 12/08/2015   Procedure: COLONOSCOPY WITH PROPOFOL;   Surgeon: Lollie Sails, MD;  Location: [facility] ENDOSCOPY;  Service: Endoscopy;  Laterality: N/A;  . COLONOSCOPY WITH PROPOFOL N/A 12/09/2015   Procedure: COLONOSCOPY WITH PROPOFOL;  Surgeon: Lollie Sails, MD;  Location: [facility] ENDOSCOPY;  Service: Endoscopy;  Laterality: N/A;  . ESOPHAGOGASTRODUODENOSCOPY (EGD) WITH PROPOFOL N/A 11/08/2017   Procedure: ESOPHAGOGASTRODUODENOSCOPY (EGD) WITH PROPOFOL;  Surgeon: Lollie Sails, MD;  Location: [facility] ENDOSCOPY;  Service: Endoscopy;  Laterality: N/A;  . JOINT REPLACEMENT Left 04/19/2006  . JOINT REPLACEMENT Right 01/14/2015  . ORIF FEMUR FRACTURE Left 02/05/2019  . ORIF FEMUR FRACTURE Left 02/05/2019   Procedure: OPEN REDUCTION INTERNAL FIXATION (ORIF) DISTAL FEMUR FRACTURE;  Surgeon: Altamese [place], MD;  Location: [facility];  Service: Orthopedics;  Laterality: Left;  . TOTAL KNEE ARTHROPLASTY Left 2008  . TOTAL KNEE ARTHROPLASTY Right 01/14/2015   Procedure: TOTAL KNEE ARTHROPLASTY;  Surgeon: Hessie Knows, MD;  Location: [facility] ORS;  Service: Orthopedics;  Laterality: Right;    There were no vitals filed for this visit.  Subjective Assessment - 04/09/19 1514    Subjective  Pt reports she is doing well today, had pain earlier but now taken a pain pill and feeling better.    Patient is accompained by:  Family member    Pertinent History  Patient is a 59 y.o. female who presents to outpatient physical therapy with a referral for medical diagnosis left femur ORIF. This patient's chief complaints consist of left knee pain, stiffness, imbalance, weakness, decreased function and ambulatory ability 2/2 to distal femur fracture from fall with ORIF performed 02/05/2019 leading to the following functional deficits: difficulty with all activities that require weight bearing and ambulation including ADLs, IADLs, household and community ambulation, grocery shopping, cooking, stairs, squatting, cleaning, lifting, bending, etc. 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 Denies history of MI, cancer, seizures, stroke.    Currently in Pain?  No/denies       OBJECTIVE FOTO = 53 (04/05/2019)   OBSERVATION/INSPECTION  Posture: forward head, rounded shoulders, slumped in sitting. Obese.   Tremor: none  Muscle bulk: generally overall mildly decreased consistent with lack of regular adequate exercise or physical activity.   Bed mobility: supine <> sit and rolling I  Transfers: sit <> stand from 18 inch surface I with no UE support with decreased L LE weight bearing and lack of knee flexion. SBA for safety.   Gait: ambulated mod I using SPC and SBA for safety in the clinic, unilateral axial crutche  short community distances.  Demo decreased weight shift to L LE with antalgic gait favoring L LE. Does appear to have sufficient L knee control and does not hyperextend during weight bearing.    FUNCTIONAL/BALANCE TESTS:  5TSTS: 18 seconds with no UE push off from 18 inch chair  Six Minute Walk Test: 611 feet with SPC and SBA for safety. Demo antalgic gait favoring L LE with limited weight shift to left.    OBJECTIVE - L knee PROM flexion = 108 following manual therapy    TREATMENT:  Gait training: to improve gait pattern for improved household and community mobility.  - ambulated 611 feet around clinic with SPC and SBA for safety. Cuing for improved weight shift towards L LE and L knee flexion for swing through.    Manual therapy: to reduce pain and tissue tension, improve range of motion, neuromodulation, in order to promote improved ability to complete functional activities. - supine PROM L knee flexion with OP to tolerance, 2-5 second hold with and without oscillations at end range, x30 - hooklying L tibiofemoral PA joint  mobilization grade III-IV 3x30sec    Therapeutic exercise: to centralize symptoms and improve ROM, strength, muscular endurance, and activity tolerance required for successful completion of functional activities.  -seated heel slides x2 minutes, repeated flexion stretch x 2sec -seated knee rotation stretch (@ 90 degrees on slides) x 2 minutes on slider  -seated knee flexion stretch c contralateral facilitation 3x30secH under chair -seated Left hip flexion isometric 2x10x3secH  -hooklying bridge, wide stance 2x10 (difficult excursion)  - seated long arc quad 3x15 each side with 10# ankle weight (intermittent flexion distraction stretch 10lb)    - sit to stand 2x5 from 18 inch (green) chair with no UE support.  -SPC AMB on graded/banked soft mulch surface, minguard assist, 2-point gait pattern utilized without cue, SPC in RUE mirroring LLE. 171f, recovery interval between.    HOME EXERCISE PROGRAM Access Code: [email or web address]: [email or web address] Date: 04/03/2019 Prepared by: SRosita Kea  Exercises Supine Heel Slide with Strap - 2 x daily - 20 reps - 5 seconds hold  Sit to Stand - 2 x daily - 3 sets - 10 reps Standing Hip Abduction with Counter Support - 2 x daily - 3 sets - 10 reps      PT Short Term Goals - 04/05/19 1335      PT SHORT TERM GOAL #1   Title  Be independent with initial home exercise program for self-management of symptoms.    Baseline  Initial HEP provided at IE (03/27/2019);    Time  3    Period  Weeks    Status  Achieved    Target Date  04/17/19        PT Long Term Goals - 04/05/19 1335      PT LONG TERM GOAL #1   Title  Be independent with a long-term home exercise program for self-management of symptoms.   ALL LONG TERM GOALS TARGET DATE 06/19/2019   Baseline  Initial HEP provided at IE (03/27/2019); currently participating (04/05/2019);    Time  12    Period  Weeks    Status  Partially Met      PT LONG TERM GOAL #2   Title  Demonstrate  improved FOTO score to equal or greater than 70 to demonstrate improvement in overall condition and self-reported functional ability.    Baseline  58 (03/27/2019); 53 (04/05/2019);    Time  12    Period  Weeks    Status  On-going      PT LONG TERM GOAL #3   Title  Patient will complete 5TSTS in equal or less than 12 seconds with minimal to no shift towards R LE and no UE support to demonstrate improved functional LE strength and power for transfers and daily mobility.    Baseline  16 seconds with B UE push off from 18.5 inch plinth, sisgnificant shift to right (03/27/2019); 18 seconds with no UE push off from 18 inch chair (04/05/2019);    Time  12    Period  Weeks    Status  Partially Met      PT LONG TERM GOAL #4   Title  Patient will ambulate at least 1000 feet during 6 Minute Walk Test in order to demonstrate ability to complete activities such as shopping and [place] ambulation.    Baseline  able to ambulate 45 feet with CGA and no AD (03/27/2019); 611 feet with SPC and SBA for safety. Demo antalgic gait favoring L LE with limited weight shift to left. (04/05/2019);    Time  12    Period  Weeks    Status  Partially Met      PT LONG TERM GOAL #5   Title  Complete community, work and/or recreational activities without limitation due to current condition.    Baseline  Difficulty with all activities that require weight bearing and ambulation including ADLs, IADLs, household and community ambulation, grocery shopping, cooking, stairs, squatting, cleaning, lifting, bending, etc (03/27/2019); improved ability to don socks (04/05/2019);    Time  12    Period  Weeks    Status  Partially Met            Plan - 04/09/19 1519    Clinical Impression Statement Continued with current plan of care, gently progressing patient's program aimed at address deficits and limitations identified in evlauation. Pt continues to make steady progress toward treatment goals in general. Author provides extensive  verbal, visual, and tactile cues when needed to assure all interventions are performed with desired form and good  accuracy. Extensive communicaiton to assure pt is able to perform all activities without exacerbation of pain or other symptoms. ROM remains significantly limited in Left knee, but strength is improving. Will continue to work on gait training on more diverse surface types and grades to prepare for improved confidence and safety in AMB outside of home.    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    Examination-Activity Limitations  Bathing;Bed Mobility;Bend;Locomotion Level;Lift;Stairs;Squat;Hygiene/Grooming;Stand;Toileting;Carry;Transfers;Dressing    Examination-Participation Restrictions  Cleaning;Shop;Yard Work;Meal Prep;Community Activity;Interpersonal Relationship    Stability/Clinical Decision Making  Evolving/Moderate complexity    Clinical Decision Making  Moderate    Rehab Potential  Good    PT Frequency  2x / week    PT Duration  12 weeks    PT Treatment/Interventions  ADLs/Self Care Home Management;Cryotherapy;Electrical Stimulation;Moist Heat;Gait training;Stair training;Functional mobility training;Therapeutic activities;Therapeutic exercise;Balance training;Neuromuscular re-education;Patient/family education;Manual techniques;Scar mobilization;Passive range of motion;Dry needling;Spinal Manipulations;Joint Manipulations    PT Next Visit Plan  progressive LE strengthening, ROM, balance training as tolerated    PT Home Exercise Plan  Medbridge Access Code: [email or web address]     Consulted and Agree with Plan of Care  Patient;Family member/caregiver    Family Member Consulted  daughter, [name]       Patient will benefit from skilled therapeutic intervention in order to improve the following deficits and impairments:  Abnormal gait, Decreased knowledge of use of DME, Increased fascial restricitons, Pain, Improper body mechanics, Hypermobility, Decreased mobility, Decreased scar mobility, Decreased activity tolerance, Decreased endurance, Decreased range of motion, Decreased strength, Hypomobility, Impaired perceived functional ability, Obesity, Impaired flexibility, Difficulty walking, Decreased balance  Visit Diagnosis: Difficulty in walking, not elsewhere classified  History of falling  Left knee pain, unspecified chronicity  Muscle weakness (generalized)     Problem List Patient Active Problem List   Diagnosis Date Noted  . Closed displaced supracondylar fracture of distal end of left femur without intracondylar extension ([place]) 02/04/2019  . Femur fracture ([place]) 02/04/2019  . Leukocytosis 02/04/2019  . Alcohol use 02/04/2019  . Right leg weakness 02/14/2018  . Elevated bilirubin 02/14/2018  . SIRS (systemic inflammatory response syndrome) ([place]) 01/30/2018  . Hypoglycemia 01/30/2018  . Lactic acidosis 01/30/2018  . Alcohol abuse 01/30/2018  . Abdominal pain, chronic, epigastric   . Pancreatitis 08/29/2017  . Diabetes ([place]) 08/29/2017  . HTN (hypertension) 08/29/2017  . HLD (hyperlipidemia) 08/29/2017  . GERD (gastroesophageal reflux disease) 08/29/2017  . Hypokalemia 08/29/2017  . Hypocalcemia 08/29/2017  . Primary osteoarthritis of right knee 01/14/2015   3:54 PM, 04/09/19 Etta Grandchild, PT, DPT Physical Therapist - [place] [phone] (Office)    Azrielle Springsteen C 04/09/2019, 3:23 PM  [facility] [address] [place], [address] Phone: [phone]   Fax:   [phone]  Name: Sheila [name] MRN: [id number] Date of Birth: Jul 21, 1960

## 2019-04-11 ENCOUNTER — Encounter: Payer: Medicaid Other | Admitting: Physical Therapy

## 2019-04-16 ENCOUNTER — Ambulatory Visit: Payer: Medicaid Other | Admitting: Physical Therapy

## 2019-04-18 ENCOUNTER — Encounter: Payer: Medicaid Other | Admitting: Physical Therapy

## 2019-04-19 ENCOUNTER — Other Ambulatory Visit: Payer: Self-pay

## 2019-04-19 ENCOUNTER — Ambulatory Visit: Payer: Medicaid Other | Admitting: Physical Therapy

## 2019-04-19 ENCOUNTER — Encounter: Payer: Self-pay | Admitting: Physical Therapy

## 2019-04-19 DIAGNOSIS — R262 Difficulty in walking, not elsewhere classified: Secondary | ICD-10-CM

## 2019-04-19 DIAGNOSIS — Z9181 History of falling: Secondary | ICD-10-CM

## 2019-04-19 DIAGNOSIS — M25562 Pain in left knee: Secondary | ICD-10-CM

## 2019-04-19 DIAGNOSIS — M6281 Muscle weakness (generalized): Secondary | ICD-10-CM

## 2019-04-19 NOTE — Therapy (Signed)
[facility] [address], [place], [address] Phone: [phone]   Fax:  [phone]  Physical Therapy Treatment  Patient Details  Name: Sheila [name] MRN: [id number] Date of Birth: Feb 11, 1960 Referring Provider (PT): Altamese Elkton, MD   Encounter Date: 04/19/2019  PT End of Session - 04/19/19 1627    Visit Number  5    Number of Visits  16    Date for PT Re-Evaluation  06/05/19    Authorization Type  Medicaid reporting period from 03/27/2019    Authorization Time Period  CCME auth 3 PT visits 04/02/19 - 04/12/19    Authorization - Visit Number  1    Authorization - Number of Visits  12    Progress Note Due on Visit  10   FOTO due 4/29   PT Start Time  1600    PT Stop Time  1640    PT Time Calculation (min)  40 min    Activity Tolerance  Patient tolerated treatment well;Patient limited by fatigue    Behavior During Therapy  [facility] for tasks assessed/performed       Past Medical History:  Diagnosis Date  . Anemia   . Arthritis   . Asthma   . COPD (chronic obstructive pulmonary disease) ([place])   . DDD (degenerative disc disease), lumbar   . Diabetes mellitus without complication ([employer])    on metformin  . DJD (degenerative joint disease)   . Dysrhythmia   . Fatty liver   . Fibroids   . GERD (gastroesophageal reflux disease)   . Glaucoma   . Hyperlipidemia   . Hypertension   . IBS (irritable bowel syndrome)   . Lumbago   . Neuromuscular disorder ([place])   . 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[place]) 49/7026  . Renal disorder   . Ulcerative colitis ([place])    on mesalamine    Past Surgical History:  Procedure Laterality Date  . COLONOSCOPY WITH PROPOFOL N/A 08/19/2015   Procedure: COLONOSCOPY WITH PROPOFOL;  Surgeon: Lollie Sails, MD;  Location: [facility] ENDOSCOPY;  Service: Endoscopy;  Laterality: N/A;  . COLONOSCOPY WITH PROPOFOL N/A 12/08/2015   Procedure: COLONOSCOPY WITH PROPOFOL;  Surgeon: Lollie Sails,  MD;  Location: [facility] ENDOSCOPY;  Service: Endoscopy;  Laterality: N/A;  . COLONOSCOPY WITH PROPOFOL N/A 12/09/2015   Procedure: COLONOSCOPY WITH PROPOFOL;  Surgeon: Lollie Sails, MD;  Location: [facility] ENDOSCOPY;  Service: Endoscopy;  Laterality: N/A;  . ESOPHAGOGASTRODUODENOSCOPY (EGD) WITH PROPOFOL N/A 11/08/2017   Procedure: ESOPHAGOGASTRODUODENOSCOPY (EGD) WITH PROPOFOL;  Surgeon: Lollie Sails, MD;  Location: [facility] ENDOSCOPY;  Service: Endoscopy;  Laterality: N/A;  . JOINT REPLACEMENT Left 04/19/2006  . JOINT REPLACEMENT Right 01/14/2015  . ORIF FEMUR FRACTURE Left 02/05/2019  . ORIF FEMUR FRACTURE Left 02/05/2019   Procedure: OPEN REDUCTION INTERNAL FIXATION (ORIF) DISTAL FEMUR FRACTURE;  Surgeon: Altamese Fallston, MD;  Location: [place];  Service: Orthopedics;  Laterality: Left;  . TOTAL KNEE ARTHROPLASTY Left 2008  . TOTAL KNEE ARTHROPLASTY Right 01/14/2015   Procedure: TOTAL KNEE ARTHROPLASTY;  Surgeon: Hessie Knows, MD;  Location: [facility] ORS;  Service: Orthopedics;  Laterality: Right;    There were no vitals filed for this visit.  Subjective Assessment - 04/19/19 1559    Subjective  Patient reports she is feeling well today and has no pain upon arrival after taking a pill. Has been able to put her own shoes and socks on  and the swelling has gone down some. HEP is  going well. Her daughter is leaving tomorrow and pt feels ready for this. She has started walking around the house without her cane and feels ready to stop using it soon.    Patient is accompained by:  Family member    Pertinent History  Patient is a 59 y.o. female who presents to outpatient physical therapy with a referral for medical diagnosis left femur ORIF. This patient's chief complaints consist of left knee pain, stiffness, imbalance, weakness, decreased function and ambulatory ability 2/2 to distal femur fracture from fall with ORIF performed 02/05/2019 leading to the following functional deficits: difficulty with all  activities that require weight bearing and ambulation including ADLs, IADLs, household and community ambulation, grocery shopping, cooking, stairs, squatting, cleaning, lifting, bending, etc. 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 Denies history of MI, cancer, seizures, stroke.    Currently in Pain?  No/denies       OBJECTIVE - L knee PROM flexion = 13fllowing manual therapy   TREATMENT: Gait training: to improve gait pattern for improved household and community mobility.  - ambulated 611 feet around clinic with SPC and SBA for safety. Cuing for improved weight shift towards L LE and L knee flexion for swing through.  Manual therapy:to reduce pain and tissue tension, improve range of motion, neuromodulation, in order to promote improved ability to complete functional activities. -supine PROM L knee flexion with OP to tolerance, 2-5 second hold with and without oscillations at end range, x30 - hooklyingL tibiofemoral PA/AP joint mobilization grade III-IV 2x30sec each direction.    Therapeutic exercise:to centralize symptoms and improve ROM, strength, muscular endurance, and activity tolerance required for successful completion of functional activities. - supine L heel slides AAROM x2 minutes, repeated flexion stretch with strap x 2sec -hooklying bridge, wide stance 2x10, 3secH (difficult excursion)  -seated knee flexion stretch c contralateral facilitation 3x30secH under chair - seated long arc quad 2x15, 3secH each side with 15# ankle weight (intermittent flexion distraction stretch 15B) - sit to stand 2x10 from 18.5 inch plinth while holding 5# DB in each hand.   -SPC AMB on graded/banked soft mulch surface, minguard assist, 2-point gait pattern  utilized without cue, SPC in RUE mirroring LLE. 2063f - Amb flat surface 50 feet with CGA and no AD, trendelenburg compensation for L glute med weakness present.    HOME EXERCISE PROGRAM Access Code: [email or web address]: [email or web address] Date: 04/03/2019 Prepared by: SaRosita KeaExercises Supine Heel Slide with Strap - 2 x daily - 20 reps - 5 seconds hold Sit to Stand - 2 x daily - 3 sets - 10 reps Standing Hip Abduction with Counter Support - 2 x daily - 3 sets - 10 reps    PT Education - 04/19/19 1627    Education Details  Exercise purpose/form. Self management techniques    Person(s) Educated  Patient    Methods  Explanation;Demonstration;Tactile cues;Verbal cues    Comprehension  Verbalized understanding;Returned demonstration;Verbal cues required;Tactile cues required;Need further instruction       PT Short Term Goals - 04/05/19 1335      PT SHORT TERM GOAL #1   Title  Be independent with initial home exercise program for self-management of symptoms.    Baseline  Initial HEP provided at IE (03/27/2019);    Time  3  Period  Weeks    Status  Achieved    Target Date  04/17/19        PT Long Term Goals - 04/05/19 1335      PT LONG TERM GOAL #1   Title  Be independent with a long-term home exercise program for self-management of symptoms.   ALL LONG TERM GOALS TARGET DATE 06/19/2019   Baseline  Initial HEP provided at IE (03/27/2019); currently participating (04/05/2019);    Time  12    Period  Weeks    Status  Partially Met      PT LONG TERM GOAL #2   Title  Demonstrate improved FOTO score to equal or greater than 70 to demonstrate improvement in overall condition and self-reported functional ability.    Baseline  58 (03/27/2019); 53 (04/05/2019);    Time  12    Period  Weeks    Status  On-going      PT LONG TERM GOAL #3   Title  Patient will complete 5TSTS in equal or less than 12 seconds with minimal to no shift towards R LE and no UE support to  demonstrate improved functional LE strength and power for transfers and daily mobility.    Baseline  16 seconds with B UE push off from 18.5 inch plinth, sisgnificant shift to right (03/27/2019); 18 seconds with no UE push off from 18 inch chair (04/05/2019);    Time  12    Period  Weeks    Status  Partially Met      PT LONG TERM GOAL #4   Title  Patient will ambulate at least 1000 feet during 6 Minute Walk Test in order to demonstrate ability to complete activities such as shopping and [place] ambulation.    Baseline  able to ambulate 45 feet with CGA and no AD (03/27/2019); 611 feet with SPC and SBA for safety. Demo antalgic gait favoring L LE with limited weight shift to left. (04/05/2019);    Time  12    Period  Weeks    Status  Partially Met      PT LONG TERM GOAL #5   Title  Complete community, work and/or recreational activities without limitation due to current condition.    Baseline  Difficulty with all activities that require weight bearing and ambulation including ADLs, IADLs, household and community ambulation, grocery shopping, cooking, stairs, squatting, cleaning, lifting, bending, etc (03/27/2019); improved ability to don socks (04/05/2019);    Time  12    Period  Weeks    Status  Partially Met            Plan - 04/19/19 1632    Clinical Impression Statement  Patient tolerated treatment well overall with no lasting increase in pain. Progressed strengthening exercises slightly and continued to work on improving ambulation and balance on more challenging surfaces. Was able to improve L knee flexion by 8 degrees. Patient would benefit from continued management of limiting condition by skilled physical therapist to address remaining impairments and functional limitations to work towards stated goals and return to PLOF or maximal functional independence.    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    Examination-Activity Limitations  Bathing;Bed Mobility;Bend;Locomotion Level;Lift;Stairs;Squat;Hygiene/Grooming;Stand;Toileting;Carry;Transfers;Dressing    Examination-Participation Restrictions  Cleaning;Shop;Yard Work;Meal Prep;Community Activity;Interpersonal Relationship    Stability/Clinical Decision Making  Evolving/Moderate complexity    Rehab Potential  Good    PT Frequency  2x / week    PT Duration  12 weeks    PT Treatment/Interventions  ADLs/Self Care Home Management;Cryotherapy;Electrical Stimulation;Moist Heat;Gait training;Stair training;Functional mobility training;Therapeutic activities;Therapeutic exercise;Balance training;Neuromuscular re-education;Patient/family education;Manual techniques;Scar mobilization;Passive range of motion;Dry needling;Spinal Manipulations;Joint Manipulations    PT Next Visit Plan  progressive LE strengthening, ROM, balance training as tolerated    PT Home Exercise Plan  Medbridge Access Code: [email or web address]    Consulted and Agree with Plan of Care  Patient;Family member/caregiver    Family Member Consulted  daughter, [name]       Patient will benefit from skilled therapeutic intervention in order to improve the following deficits and impairments:  Abnormal gait, Decreased knowledge of use of DME, Increased fascial restricitons, Pain, Improper body mechanics, Hypermobility, Decreased mobility, Decreased scar mobility, Decreased activity tolerance, Decreased endurance, Decreased range of motion, Decreased strength, Hypomobility, Impaired perceived functional ability, Obesity, Impaired flexibility, Difficulty walking, Decreased  balance  Visit Diagnosis: Difficulty in walking, not elsewhere classified  History of falling  Left knee pain, unspecified chronicity  Muscle weakness (generalized)     Problem List Patient Active Problem List   Diagnosis Date Noted  . Closed displaced supracondylar fracture of distal end of left femur without intracondylar extension ([place]) 02/04/2019  . Femur fracture ([place]) 02/04/2019  . Leukocytosis 02/04/2019  . Alcohol use 02/04/2019  . Right leg weakness 02/14/2018  . Elevated bilirubin 02/14/2018  . SIRS (systemic inflammatory response syndrome) ([place]) 01/30/2018  . Hypoglycemia 01/30/2018  . Lactic acidosis 01/30/2018  . Alcohol abuse 01/30/2018  . Abdominal pain, chronic, epigastric   . Pancreatitis 08/29/2017  . Diabetes ([place]) 08/29/2017  . HTN (hypertension) 08/29/2017  . HLD (hyperlipidemia) 08/29/2017  . GERD (gastroesophageal reflux disease) 08/29/2017  . Hypokalemia 08/29/2017  . Hypocalcemia 08/29/2017  . Primary osteoarthritis of right knee 01/14/2015    Everlean Alstrom. Graylon Good, PT, DPT 04/19/19, 4:50 PM  [facility] [address] [place], [address] Phone: [phone]   Fax:  [phone]  Name: CHANON LONEY MRN: [id number] Date of Birth: 02-19-1960

## 2019-04-23 ENCOUNTER — Ambulatory Visit: Payer: Medicaid Other | Admitting: Physical Therapy

## 2019-04-23 ENCOUNTER — Encounter: Payer: Self-pay | Admitting: Physical Therapy

## 2019-04-23 ENCOUNTER — Other Ambulatory Visit: Payer: Self-pay

## 2019-04-23 DIAGNOSIS — Z9181 History of falling: Secondary | ICD-10-CM

## 2019-04-23 DIAGNOSIS — M6281 Muscle weakness (generalized): Secondary | ICD-10-CM

## 2019-04-23 DIAGNOSIS — R262 Difficulty in walking, not elsewhere classified: Secondary | ICD-10-CM | POA: Diagnosis not present

## 2019-04-23 DIAGNOSIS — M25562 Pain in left knee: Secondary | ICD-10-CM

## 2019-04-23 NOTE — Therapy (Signed)
[facility] [address], [place], [address] Phone: [phone]   Fax:  [phone]  Physical Therapy Treatment  Patient Details  Name: Sheila [name] MRN: [id number] Date of Birth: 1960-03-01 Referring Provider (PT): Altamese Kite, MD   Encounter Date: 04/23/2019  PT End of Session - 04/23/19 1923    Visit Number  6    Number of Visits  16    Date for PT Re-Evaluation  06/05/19    Authorization Type  Medicaid reporting period from 03/27/2019    Authorization Time Period  CCME auth 3 PT visits 04/02/19 - 04/12/19    Authorization - Visit Number  2    Authorization - Number of Visits  12    Progress Note Due on Visit  10   FOTO due 4/29   PT Start Time  1527   patient very early   PT Stop Time  1615    PT Time Calculation (min)  48 min    Activity Tolerance  Patient tolerated treatment well;Patient limited by fatigue    Behavior During Therapy  [facility] for tasks assessed/performed       Past Medical History:  Diagnosis Date  . Anemia   . Arthritis   . Asthma   . COPD (chronic obstructive pulmonary disease) ([place])   . DDD (degenerative disc disease), lumbar   . Diabetes mellitus without complication ([place])    on metformin  . DJD (degenerative joint disease)   . Dysrhythmia   . Fatty liver   . Fibroids   . GERD (gastroesophageal reflux disease)   . Glaucoma   . Hyperlipidemia   . Hypertension   . IBS (irritable bowel syndrome)   . Lumbago   . Neuromuscular disorder ([place])   . 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[place]) 54/9826  . Renal disorder   . Ulcerative colitis ([place])    on mesalamine    Past Surgical History:  Procedure Laterality Date  . COLONOSCOPY WITH PROPOFOL N/A 08/19/2015   Procedure: COLONOSCOPY WITH PROPOFOL;  Surgeon: Lollie Sails, MD;  Location[facility] END[facility]COPY;  Service: Endoscopy;  Laterality: N/A;  . COLONOSCOPY WITH PROPOFOL N/A 12/08/2015   Procedure: COLONOSCOPY WITH PROPOFOL;   Surgeon: Lollie Sails, MD;  Location: [facility] ENDOSCOPY;  Service: Endoscopy;  Laterality: N/A;  . COLONOSCOPY WITH PROPOFOL N/A 12/09/2015   Procedure: COLONOSCOPY WITH PROPOFOL;  Surgeon: Lollie Sails, MD;  Location: [facility] ENDOSCOPY;  Service: Endoscopy;  Laterality: N/A;  . ESOPHAGOGASTRODUODENOSCOPY (EGD) WITH PROPOFOL N/A 11/08/2017   Procedure: ESOPHAGOGASTRODUODENOSCOPY (EGD) WITH PROPOFOL;  Surgeon: Lollie Sails, MD;  Location: [facility] ENDOSCOPY;  Service: Endoscopy;  Laterality: N/A;  . JOINT REPLACEMENT Left 04/19/2006  . JOINT REPLACEMENT Right 01/14/2015  . ORIF FEMUR FRACTURE Left 02/05/2019  . ORIF FEMUR FRACTURE Left 02/05/2019   Procedure: OPEN REDUCTION INTERNAL FIXATION (ORIF) DISTAL FEMUR FRACTURE;  Surgeon: Altamese Hooper, MD;  Location: [place];  Service: Orthopedics;  Laterality: Left;  . TOTAL KNEE ARTHROPLASTY Left 2008  . TOTAL KNEE ARTHROPLASTY Right 01/14/2015   Procedure: TOTAL KNEE ARTHROPLASTY;  Surgeon: Hessie Knows, MD;  Location: [facility] ORS;  Service: Orthopedics;  Laterality: Right;    There were no vitals filed for this visit.  Subjective Assessment - 04/23/19 1920    Subjective  Patient reports she is feeling well and is eager to get rid of the cane and walk without an AD. She has slight discomfort in her L knee, but it is not currently bothering  her much. She states she is walking around her home without a cane and has been doing well since her daughter left. The most important thing to her is for the pain to go away. She also has not started using stairs much. She states she had a limp prior to her fracture at baseline.    Patient is accompained by:  --    Pertinent History  Patient is a 59 y.o. female who presents to outpatient physical therapy with a referral for medical diagnosis left femur ORIF. This patient's chief complaints consist of left knee pain, stiffness, imbalance, weakness, decreased function and ambulatory ability 2/2 to distal femur fracture  from fall with ORIF performed 02/05/2019 leading to the following functional deficits: difficulty with all activities that require weight bearing and ambulation including ADLs, IADLs, household and community ambulation, grocery shopping, cooking, stairs, squatting, cleaning, lifting, bending, etc. 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 Denies history of MI, cancer, seizures, stroke.    Currently in Pain?  Yes    Pain Score  1     Pain Location  Knee    Pain Orientation  Left       OBJECTIVE - L knee PROM flexion = 165fllowing manual therapy - L knee AAROM closed chain = 100   TREATMENT:  Therapeutic exercise:to centralize symptoms and improve ROM, strength, muscular endurance, and activity tolerance required for successful completion of functional activities. - L knee closed chain flexion AAROM on the step, 3 second hold x 20 - L runner's step up (knee high) at stairs to 6 inch step, B UE support as needed, 2x10 - standing L hip extension/aduction with yellow theraband around ankles x 10, no band x10  Neuromuscular Re-education: to improve, balance, postural strength, muscle activation patterns, and stabilization strength required for functional activities (CGA - MinA as needed): - standing alternating cone double taps 2x10 each side - standing heel taps over 6-inch hurdle, 2x10 each side  Gait training: to improve gait pattern for improved household and community mobility.  Ambulation practice with no AD and CGA min A as needed: - semi-tandem ambulation 3x15 feet, cuing for improved weight shift and foot placement. - cross marching x 50 feet  Manual therapy:to reduce pain and tissue tension, improve range of motion, neuromodulation, in order to promote  improved ability to complete functional activities. -supine PROM L knee flexion with OP to tolerance, 2-5 second hold with and without oscillations at end range, x10    HOME EXERCISE PROGRAM Access Code: [email or web address]: [email or web address] Date: 04/03/2019 Prepared by: SRosita Kea Exercises Supine Heel Slide with Strap - 2 x daily - 20 reps - 5 seconds hold Sit to Stand - 2 x daily - 3 sets - 10 reps Standing Hip Abduction with Counter Support - 2 x daily - 3 sets - 10 reps  PT Education - 04/23/19 1923    Education Details  Exercise purpose/form. Self management techniques    Person(s) Educated  Patient    Methods  Explanation;Demonstration;Tactile cues;Verbal cues    Comprehension  Verbalized understanding;Returned demonstration;Verbal cues required;Tactile cues required;Need further instruction       PT Short Term Goals - 04/05/19 1335      PT SHORT TERM GOAL #1   Title  Be independent with initial home exercise program for  self-management of symptoms.    Baseline  Initial HEP provided at IE (03/27/2019);    Time  3    Period  Weeks    Status  Achieved    Target Date  04/17/19        PT Long Term Goals - 04/05/19 1335      PT LONG TERM GOAL #1   Title  Be independent with a long-term home exercise program for self-management of symptoms.   ALL LONG TERM GOALS TARGET DATE 06/19/2019   Baseline  Initial HEP provided at IE (03/27/2019); currently participating (04/05/2019);    Time  12    Period  Weeks    Status  Partially Met      PT LONG TERM GOAL #2   Title  Demonstrate improved FOTO score to equal or greater than 70 to demonstrate improvement in overall condition and self-reported functional ability.    Baseline  58 (03/27/2019); 53 (04/05/2019);    Time  12    Period  Weeks    Status  On-going      PT LONG TERM GOAL #3   Title  Patient will complete 5TSTS in equal or less than 12 seconds with minimal to no shift towards R LE and no UE support to  demonstrate improved functional LE strength and power for transfers and daily mobility.    Baseline  16 seconds with B UE push off from 18.5 inch plinth, sisgnificant shift to right (03/27/2019); 18 seconds with no UE push off from 18 inch chair (04/05/2019);    Time  12    Period  Weeks    Status  Partially Met      PT LONG TERM GOAL #4   Title  Patient will ambulate at least 1000 feet during 6 Minute Walk Test in order to demonstrate ability to complete activities such as shopping and [place] ambulation.    Baseline  able to ambulate 45 feet with CGA and no AD (03/27/2019); 611 feet with SPC and SBA for safety. Demo antalgic gait favoring L LE with limited weight shift to left. (04/05/2019);    Time  12    Period  Weeks    Status  Partially Met      PT LONG TERM GOAL #5   Title  Complete community, work and/or recreational activities without limitation due to current condition.    Baseline  Difficulty with all activities that require weight bearing and ambulation including ADLs, IADLs, household and community ambulation, grocery shopping, cooking, stairs, squatting, cleaning, lifting, bending, etc (03/27/2019); improved ability to don socks (04/05/2019);    Time  12    Period  Weeks    Status  Partially Met            Plan - 04/23/19 1930    Clinical Impression Statement  Patient tolerated treatment well with tolerable intermittent discomfort in L knee. Continues to show improvements overall in knee ROM and strength. Demonstrates compensated L trendelenburg gait and wide stance with ambulation. Continued to work on stretching, strengthening, balance, and gait activities to improve L LE ROM and functional strength. Patient would benefit from continued management of limiting condition by skilled physical therapist to address remaining impairments and functional limitations to work towards stated goals and return to PLOF or maximal functional independence.    Personal Factors and  Comorbidities  Age;Comorbidity 3+;Other;Past/Current Experience;Social Background;Time since onset of injury/illness/exacerbation;Transportation;Fitness    Comorbidities  Relevant past medical history and comorbidities include HTN, COPD, DDD,  diabetes mellitus II, DJD, dysrhythmia, fatty liver, GERD, arthritis, recovering alcoholic (sober for last 4 years), renal disorder, ulcerative colitis, 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    Examination-Activity Limitations  Bathing;Bed Mobility;Bend;Locomotion Level;Lift;Stairs;Squat;Hygiene/Grooming;Stand;Toileting;Carry;Transfers;Dressing    Examination-Participation Restrictions  Cleaning;Shop;Yard Work;Meal Prep;Community Activity;Interpersonal Relationship    Stability/Clinical Decision Making  Evolving/Moderate complexity    Rehab Potential  Good    PT Frequency  2x / week    PT Duration  12 weeks    PT Treatment/Interventions  ADLs/Self Care Home Management;Cryotherapy;Electrical Stimulation;Moist Heat;Gait training;Stair training;Functional mobility training;Therapeutic activities;Therapeutic exercise;Balance training;Neuromuscular re-education;Patient/family education;Manual techniques;Scar mobilization;Passive range of motion;Dry needling;Spinal Manipulations;Joint Manipulations    PT Next Visit Plan  progressive LE strengthening, ROM, balance training as tolerated    PT Home Exercise Plan  Medbridge Access Code: [email or web address]    Consulted and Agree with Plan of Care  Patient       Patient will benefit from skilled therapeutic intervention in order to improve the following deficits and impairments:  Abnormal gait, Decreased knowledge of use of DME, Increased fascial restricitons, Pain, Improper body mechanics, Hypermobility, Decreased mobility, Decreased scar mobility, Decreased activity tolerance, Decreased endurance, Decreased range of motion, Decreased strength, Hypomobility, Impaired perceived functional ability, Obesity, Impaired flexibility,  Difficulty walking, Decreased balance  Visit Diagnosis: Difficulty in walking, not elsewhere classified  History of falling  Left knee pain, unspecified chronicity  Muscle weakness (generalized)     Problem List Patient Active Problem List   Diagnosis Date Noted  . Closed displaced supracondylar fracture of distal end of left femur without intracondylar extension ([place]) 02/04/2019  . Femur fracture ([place]) 02/04/2019  . Leukocytosis 02/04/2019  . Alcohol use 02/04/2019  . Right leg weakness 02/14/2018  . Elevated bilirubin 02/14/2018  . SIRS (systemic inflammatory response syndrome) ([place]) 01/30/2018  . Hypoglycemia 01/30/2018  . Lactic acidosis 01/30/2018  . Alcohol abuse 01/30/2018  . Abdominal pain, chronic, epigastric   . Pancreatitis 08/29/2017  . Diabetes ([place]) 08/29/2017  . HTN (hypertension) 08/29/2017  . HLD (hyperlipidemia) 08/29/2017  . GERD (gastroesophageal reflux disease) 08/29/2017  . Hypokalemia 08/29/2017  . Hypocalcemia 08/29/2017  . Primary osteoarthritis of right knee 01/14/2015    Everlean Alstrom. Graylon Good, PT, DPT 04/23/19, 7:32 PM  [facility] [address], [place], [address] Phone: [phone]   Fax:  [phone]  Name: Sheila [name] MRN: [id number] Date of Birth: 04/08/1960

## 2019-04-25 ENCOUNTER — Ambulatory Visit: Payer: Medicaid Other | Admitting: Physical Therapy

## 2019-04-25 ENCOUNTER — Other Ambulatory Visit: Payer: Self-pay

## 2019-04-25 DIAGNOSIS — R262 Difficulty in walking, not elsewhere classified: Secondary | ICD-10-CM

## 2019-04-25 DIAGNOSIS — Z9181 History of falling: Secondary | ICD-10-CM

## 2019-04-25 DIAGNOSIS — M6281 Muscle weakness (generalized): Secondary | ICD-10-CM

## 2019-04-25 DIAGNOSIS — M25562 Pain in left knee: Secondary | ICD-10-CM

## 2019-04-25 NOTE — Therapy (Signed)
[facility] [address] [place], [address] Phone: [phone]   Fax:  [phone]  Physical Therapy Treatment  Patient Details  Name: Sheila [name] MRN: [id number] Date of Birth: August 05, 1960 Referring Provider (PT): Altamese Seminole, MD   Encounter Date: 04/25/2019  PT End of Session - 04/25/19 1457    Visit Number  7    Number of Visits  16    Date for PT Re-Evaluation  06/05/19    Authorization Type  Medicaid reporting period from 03/27/2019    Authorization Time Period  CCME auth 3 PT visits 04/02/19 - 04/12/19    Authorization - Visit Number  3    Authorization - Number of Visits  12    Progress Note Due on Visit  10   FOTO due 4/29   PT Start Time  1454    PT Stop Time  1545    PT Time Calculation (min)  51 min    Activity Tolerance  Patient tolerated treatment well;Patient limited by fatigue    Behavior During Therapy  [facility] for tasks assessed/performed       Past Medical History:  Diagnosis Date  . Anemia   . Arthritis   . Asthma   . COPD (chronic obstructive pulmonary disease) ([place])   . DDD (degenerative disc disease), lumbar   . Diabetes mellitus without complication ([place])    on metformin  . DJD (degenerative joint disease)   . Dysrhythmia   . Fatty liver   . Fibroids   . GERD (gastroesophageal reflux disease)   . Glaucoma   . Hyperlipidemia   . Hypertension   . IBS (irritable bowel syndrome)   . Lumbago   . Neuromuscular disorder ([place])   . 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[place]) 11/9415  . Renal disorder   . Ulcerative colitis ([place])    on mesalamine    Past Surgical History:  Procedure Laterality Date  . COLONOSCOPY WITH PROPOFOL N/A 08/19/2015   Procedure: COLONOSCOPY WITH PROPOFOL;  Surgeon: Lollie Sails, MD;  Location: [facility] ENDOSCOPY;  Service: Endoscopy;  Laterality: N/A;  . COLONOSCOPY WITH PROPOFOL N/A 12/08/2015   Procedure: COLONOSCOPY WITH PROPOFOL;  Surgeon: Lollie Sails,  MD;  Location: [facility] ENDOSCOPY;  Service: Endoscopy;  Laterality: N/A;  . COLONOSCOPY WITH PROPOFOL N/A 12/09/2015   Procedure: COLONOSCOPY WITH PROPOFOL;  Surgeon: Lollie Sails, MD;  Location: [facility] ENDOSCOPY;  Service: Endoscopy;  Laterality: N/A;  . ESOPHAGOGASTRODUODENOSCOPY (EGD) WITH PROPOFOL N/A 11/08/2017   Procedure: ESOPHAGOGASTRODUODENOSCOPY (EGD) WITH PROPOFOL;  Surgeon: Lollie Sails, MD;  Location: [facility] ENDOSCOPY;  Service: Endoscopy;  Laterality: N/A;  . JOINT REPLACEMENT Left 04/19/2006  . JOINT REPLACEMENT Right 01/14/2015  . ORIF FEMUR FRACTURE Left 02/05/2019  . ORIF FEMUR FRACTURE Left 02/05/2019   Procedure: OPEN REDUCTION INTERNAL FIXATION (ORIF) DISTAL FEMUR FRACTURE;  Surgeon: Altamese Zapata, MD;  Location: [place];  Service: Orthopedics;  Laterality: Left;  . TOTAL KNEE ARTHROPLASTY Left 2008  . TOTAL KNEE ARTHROPLASTY Right 01/14/2015   Procedure: TOTAL KNEE ARTHROPLASTY;  Surgeon: Hessie Knows, MD;  Location: [facility] ORS;  Service: Orthopedics;  Laterality: Right;    There were no vitals filed for this visit.  Subjective Assessment - 04/25/19 1459    Subjective  Patient reports she has no pain upon arrival. Was sore for 1 hour following last treatment session. No falls. Still uses cane at home some but tries not to use it.    Pertinent History  Patient  is a 59 y.o. female who presents to outpatient physical therapy with a referral for medical diagnosis left femur ORIF. This patient's chief complaints consist of left knee pain, stiffness, imbalance, weakness, decreased function and ambulatory ability 2/2 to distal femur fracture from fall with ORIF performed 02/05/2019 leading to the following functional deficits: difficulty with all activities that require weight bearing and ambulation including ADLs, IADLs, household and community ambulation, grocery shopping, cooking, stairs, squatting, cleaning, lifting, bending, etc. 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 Denies history of MI, cancer, seizures, stroke.    Currently in Pain?  No/denies       OBJECTIVE - L knee PROM flexion = 150fllowing manual therapy - L knee AAROM closed chain   TREATMENT:  Therapeutic exercise:to centralize symptoms and improve ROM, strength, muscular endurance, and activity tolerance required for successful completion of functional activities. - L knee closed chain flexion AAROM on the step, 3 second hold x 20 - L runner's step up (knee high) at stairs to 6 inch step (first set) 8 inch step (2nd and 3rd sets), R UE support as needed, 3x10. CGA for safety.  - side stepping with yellow theraband around ankles, x21 feet each direction. CGA-minA for safety.  - standing L hip AROM extension/slight abduction x20 each side. BUE support. - heel taps off edge of 4 inch step, x10 each side, same side UE support and CGA for safety. Very difficult to tap R heel.  - sidelying L hip abduction x 5, cuing for improved form.   Neuromuscular Re-education: to improve, balance, postural strength, muscle activation patterns, and stabilization strength required for functional activities (CGA - MinA as needed): - standing alternating cone double taps x20 each side - standing heel taps over 6-inch hurdle, 2x10 each side  Gait training: to improve gait pattern for improved household and community mobility.  Ambulation practice with no AD and CGA min A as needed: - semi-tandem ambulation 45 feet, cuing for improved weight shift and foot placement. - cross marching x 100 feet   Manual therapy:to reduce pain and tissue tension, improve range of motion, neuromodulation, in order to promote improved ability to complete functional activities. -supine PROM L knee flexion  with OP to tolerance, 2-5 second hold with and without oscillations at end range, x10 - supine L patellar mobs, inferior and medial glides, grade III-IV  - hooklying L tibiofemoral PA , AP, and lateral rotational mobs, grade III-IV to improve motion.     HOME EXERCISE PROGRAM Access Code: [email or web address]: [email or web address] Date: 04/25/2019 Prepared by: SRosita Kea Exercises Supine Heel Slide with Strap - 2 x daily - 20 reps - 5 seconds hold Sit to Stand - 2 x daily - 3 sets - 10 reps Standing Hip Abduction with Counter Support - 2 x daily - 3 sets - 10 reps Sidelying Hip Abduction - 1 x daily - 3 sets - 10 reps   PT Education - 04/25/19 1458    Education Details  Exercise purpose/form. Self management techniques    Person(s) Educated  Patient    Methods  Explanation;Demonstration;Tactile cues;Verbal cues    Comprehension  Verbalized understanding;Returned demonstration;Verbal cues required;Tactile cues required;Need further instruction       PT Short Term Goals - 04/05/19 1335      PT SHORT TERM  GOAL #1   Title  Be independent with initial home exercise program for self-management of symptoms.    Baseline  Initial HEP provided at IE (03/27/2019);    Time  3    Period  Weeks    Status  Achieved    Target Date  04/17/19        PT Long Term Goals - 04/05/19 1335      PT LONG TERM GOAL #1   Title  Be independent with a long-term home exercise program for self-management of symptoms.   ALL LONG TERM GOALS TARGET DATE 06/19/2019   Baseline  Initial HEP provided at IE (03/27/2019); currently participating (04/05/2019);    Time  12    Period  Weeks    Status  Partially Met      PT LONG TERM GOAL #2   Title  Demonstrate improved FOTO score to equal or greater than 70 to demonstrate improvement in overall condition and self-reported functional ability.    Baseline  58 (03/27/2019); 53 (04/05/2019);    Time  12    Period  Weeks    Status  On-going      PT LONG  TERM GOAL #3   Title  Patient will complete 5TSTS in equal or less than 12 seconds with minimal to no shift towards R LE and no UE support to demonstrate improved functional LE strength and power for transfers and daily mobility.    Baseline  16 seconds with B UE push off from 18.5 inch plinth, sisgnificant shift to right (03/27/2019); 18 seconds with no UE push off from 18 inch chair (04/05/2019);    Time  12    Period  Weeks    Status  Partially Met      PT LONG TERM GOAL #4   Title  Patient will ambulate at least 1000 feet during 6 Minute Walk Test in order to demonstrate ability to complete activities such as shopping and  ambulation.    Baseline  able to ambulate 45 feet with CGA and no AD (03/27/2019); 611 feet with SPC and SBA for safety. Demo antalgic gait favoring L LE with limited weight shift to left. (04/05/2019);    Time  12    Period  Weeks    Status  Partially Met      PT LONG TERM GOAL #5   Title  Complete community, work and/or recreational activities without limitation due to current condition.    Baseline  Difficulty with all activities that require weight bearing and ambulation including ADLs, IADLs, household and community ambulation, grocery shopping, cooking, stairs, squatting, cleaning, lifting, bending, etc (03/27/2019); improved ability to don socks (04/05/2019);    Time  12    Period  Weeks    Status  Partially Met            Plan - 04/25/19 1552    Clinical Impression Statement  Patient tolerated treatment well overall with some increased discomfort by end of session due to high level of effort to complete exercises. Had high rate of breathing due to physical exertion throughout session and was provided standing rests. Did not sit down until manual at end of session. Gave excellent effort. Able to progress several exercises. Continues to show significant trendelenburg gait favoring L LE suggesting significantly weak L glute med and mild weakness in L  glute med. Patient would benefit from continued management of limiting condition by skilled physical therapist to address remaining impairments and functional limitations to  work towards stated goals and return to PLOF or maximal functional independence.    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    Examination-Activity Limitations  Bathing;Bed Mobility;Bend;Locomotion Level;Lift;Stairs;Squat;Hygiene/Grooming;Stand;Toileting;Carry;Transfers;Dressing    Examination-Participation Restrictions  Cleaning;Shop;Yard Work;Meal Prep;Community Activity;Interpersonal Relationship    Stability/Clinical Decision Making  Evolving/Moderate complexity    Rehab Potential  Good    PT Frequency  2x / week    PT Duration  12 weeks    PT Treatment/Interventions  ADLs/Self Care Home Management;Cryotherapy;Electrical Stimulation;Moist Heat;Gait training;Stair training;Functional mobility training;Therapeutic activities;Therapeutic exercise;Balance training;Neuromuscular re-education;Patient/family education;Manual techniques;Scar mobilization;Passive range of motion;Dry needling;Spinal Manipulations;Joint Manipulations    PT Next Visit Plan  progressive LE strengthening, ROM, balance training as tolerated    PT Home Exercise Plan  Medbridge Access Code: [email or web address]    Consulted and Agree with Plan of Care  Patient       Patient will benefit from skilled therapeutic intervention in order to improve the following deficits and impairments:  Abnormal gait, Decreased knowledge of use of DME, Increased fascial  restricitons, Pain, Improper body mechanics, Hypermobility, Decreased mobility, Decreased scar mobility, Decreased activity tolerance, Decreased endurance, Decreased range of motion, Decreased strength, Hypomobility, Impaired perceived functional ability, Obesity, Impaired flexibility, Difficulty walking, Decreased balance  Visit Diagnosis: Difficulty in walking, not elsewhere classified  History of falling  Left knee pain, unspecified chronicity  Muscle weakness (generalized)     Problem List Patient Active Problem List   Diagnosis Date Noted  . Closed displaced supracondylar fracture of distal end of left femur without intracondylar extension ([place]) 02/04/2019  . Femur fracture ([place]) 02/04/2019  . Leukocytosis 02/04/2019  . Alcohol use 02/04/2019  . Right leg weakness 02/14/2018  . Elevated bilirubin 02/14/2018  . SIRS (systemic inflammatory response syndrome) ([place]) 01/30/2018  . Hypoglycemia 01/30/2018  . Lactic acidosis 01/30/2018  . Alcohol abuse 01/30/2018  . Abdominal pain, chronic, epigastric   . Pancreatitis 08/29/2017  . Diabetes ([place]) 08/29/2017  . HTN (hypertension) 08/29/2017  . HLD (hyperlipidemia) 08/29/2017  . GERD (gastroesophageal reflux disease) 08/29/2017  . Hypokalemia 08/29/2017  . Hypocalcemia 08/29/2017  . Primary osteoarthritis of right knee 01/14/2015    Everlean Alstrom. Graylon Good, PT, DPT 04/25/19, 3:54 PM  [facility] [address], [place], [address] Phone: [phone]   Fax:  [phone]  Name: Sheila [name] MRN: [id number] Date of Birth: 11-28-1960

## 2019-04-30 ENCOUNTER — Ambulatory Visit: Payer: Medicaid Other | Admitting: Physical Therapy

## 2019-04-30 ENCOUNTER — Encounter: Payer: Self-pay | Admitting: Physical Therapy

## 2019-04-30 ENCOUNTER — Other Ambulatory Visit: Payer: Self-pay

## 2019-04-30 DIAGNOSIS — M25562 Pain in left knee: Secondary | ICD-10-CM

## 2019-04-30 DIAGNOSIS — R262 Difficulty in walking, not elsewhere classified: Secondary | ICD-10-CM | POA: Diagnosis not present

## 2019-04-30 DIAGNOSIS — Z9181 History of falling: Secondary | ICD-10-CM

## 2019-04-30 DIAGNOSIS — M6281 Muscle weakness (generalized): Secondary | ICD-10-CM

## 2019-04-30 NOTE — Therapy (Signed)
[facility] [address] [place], [address] Phone: [phone]   Fax:  [phone]  Physical Therapy Treatment  Patient Details  Name: Sheila [name] MRN: [id number] Date of Birth: 1960-03-09 Referring Provider (PT): Altamese [place], MD   Encounter Date: 04/30/2019  PT End of Session - 04/30/19 1717    Visit Number  8    Number of Visits  16    Date for PT Re-Evaluation  06/05/19    Authorization Type  Medicaid reporting period from 03/27/2019    Authorization Time Period  CCME auth 3 PT visits 04/02/19 - 04/12/19    Authorization - Visit Number  4    Authorization - Number of Visits  12    Progress Note Due on Visit  10   FOTO due 4/29   PT Start Time  3295    PT Stop Time  1884    PT Time Calculation (min)  40 min    Activity Tolerance  Patient tolerated treatment well;No increased pain;Patient limited by pain    Behavior During Therapy  [facility] for tasks assessed/performed       Past Medical History:  Diagnosis Date  . Anemia   . Arthritis   . Asthma   . COPD (chronic obstructive pulmonary disease) ([place])   . DDD (degenerative disc disease), lumbar   . Diabetes mellitus without complication ([place])    on metformin  . DJD (degenerative joint disease)   . Dysrhythmia   . Fatty liver   . Fibroids   . GERD (gastroesophageal reflux disease)   . Glaucoma   . Hyperlipidemia   . Hypertension   . IBS (irritable bowel syndrome)   . Lumbago   . Neuromuscular disorder ([place])   . 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[place]) 16/6063  . Renal disorder   . Ulcerative colitis ([place])    on mesalamine    Past Surgical History:  Procedure Laterality Date  . COLONOSCOPY WITH PROPOFOL N/A 08/19/2015   Procedure: COLONOSCOPY WITH PROPOFOL;  Surgeon: Lollie Sails, MD;  Location: [facility] ENDOSCOPY;  Service: Endoscopy;  Laterality: N/A;  . COLONOSCOPY WITH PROPOFOL N/A 12/08/2015   Procedure: COLONOSCOPY WITH PROPOFOL;  Surgeon:  Lollie Sails, MD;  Location: [facility] ENDOSCOPY;  Service: Endoscopy;  Laterality: N/A;  . COLONOSCOPY WITH PROPOFOL N/A 12/09/2015   Procedure: COLONOSCOPY WITH PROPOFOL;  Surgeon: Lollie Sails, MD;  Location: [facility] ENDOSCOPY;  Service: Endoscopy;  Laterality: N/A;  . ESOPHAGOGASTRODUODENOSCOPY (EGD) WITH PROPOFOL N/A 11/08/2017   Procedure: ESOPHAGOGASTRODUODENOSCOPY (EGD) WITH PROPOFOL;  Surgeon: Lollie Sails, MD;  Location: [facility] ENDOSCOPY;  Service: Endoscopy;  Laterality: N/A;  . JOINT REPLACEMENT Left 04/19/2006  . JOINT REPLACEMENT Right 01/14/2015  . ORIF FEMUR FRACTURE Left 02/05/2019  . ORIF FEMUR FRACTURE Left 02/05/2019   Procedure: OPEN REDUCTION INTERNAL FIXATION (ORIF) DISTAL FEMUR FRACTURE;  Surgeon: Altamese [place], MD;  Location: [facility];  Service: Orthopedics;  Laterality: Left;  . TOTAL KNEE ARTHROPLASTY Left 2008  . TOTAL KNEE ARTHROPLASTY Right 01/14/2015   Procedure: TOTAL KNEE ARTHROPLASTY;  Surgeon: Hessie Knows, MD;  Location: [facility] ORS;  Service: Orthopedics;  Laterality: Right;    There were no vitals filed for this visit.  Subjective Assessment - 04/30/19 1716    Subjective  Patient reports she had pain all weekend in her left knee following last treatment session. It is getting better but is still present at a low level in her lateral/anterior L knee. Feels that she did  a bit too much at last session.    Pertinent History  Patient is a 59 y.o. female who presents to outpatient physical therapy with a referral for medical diagnosis left femur ORIF. This patient's chief complaints consist of left knee pain, stiffness, imbalance, weakness, decreased function and ambulatory ability 2/2 to distal femur fracture from fall with ORIF performed 02/05/2019 leading to the following functional deficits: difficulty with all activities that require weight bearing and ambulation including ADLs, IADLs, household and community ambulation, grocery shopping, cooking, stairs, squatting,  cleaning, lifting, bending, etc. 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 Denies history of MI, cancer, seizures, stroke.    Currently in Pain?  Yes    Pain Score  1     Pain Location  Knee    Pain Orientation  Left;Anterior;Lateral        TREATMENT:  Therapeutic exercise:to centralize symptoms and improve ROM, strength, muscular endurance, and activity tolerance required for successful completion of functional activities. - standing L hip AROM extension/slight abduction 2x20 each side with yellow theraband looped around ankles. BUE support. - sidelying L hip abduction x 20, cuing for improved form.  - hooklying bridge x 20 (mild discomfort at knee, poor clearance on L compared to R).   Neuromuscular Re-education:to improve, balance, postural strength, muscle activation patterns, and stabilization strength required for functional activities (CGA - MinA as needed): - standing alternating cone double taps 2x20 each side  Gait training:to improve gait pattern for improved household and community mobility.  Ambulation practice with no AD and CGA min A as needed: - semi-tandem ambulation on line on floor10x10 feet, cuing for improved weight shift and foot placement. - cross marching x 100 feet   Manual therapy:to reduce pain and tissue tension, improve range of motion, neuromodulation, in order to promote improved ability to complete functional activities. - supine STM to left quadruceps with and without "the stick" roller, and scar massage over lateral incision region and peripatellar region.  - PROM L hip flexor stretch with leg off table in supine, 3x30 seconds    HOME EXERCISE PROGRAM Access Code: [email or web address] URL: [email or web address] Date:  04/25/2019 Prepared by: Rosita Kea  Exercises Supine Heel Slide with Strap - 2 x daily - 20 reps - 5 seconds hold Sit to Stand - 2 x daily - 3 sets - 10 reps Standing Hip Abduction with Counter Support - 2 x daily - 3 sets - 10 reps Sidelying Hip Abduction - 1 x daily - 3 sets - 10 reps    PT Education - 04/30/19 1717    Education Details  Exercise purpose/form. Self management techniques    Person(s) Educated  Patient    Methods  Explanation;Demonstration;Tactile cues;Verbal cues    Comprehension  Verbalized understanding;Returned demonstration;Verbal cues required;Tactile cues required;Need further instruction       PT Short Term Goals - 04/05/19 1335      PT SHORT TERM GOAL #1   Title  Be independent with initial home exercise program for self-management of symptoms.    Baseline  Initial HEP provided at IE (03/27/2019);    Time  3    Period  Weeks    Status  Achieved    Target Date  04/17/19        PT Long Term Goals - 04/05/19 1335  PT LONG TERM GOAL #1   Title  Be independent with a long-term home exercise program for self-management of symptoms.   ALL LONG TERM GOALS TARGET DATE 06/19/2019   Baseline  Initial HEP provided at IE (03/27/2019); currently participating (04/05/2019);    Time  12    Period  Weeks    Status  Partially Met      PT LONG TERM GOAL #2   Title  Demonstrate improved FOTO score to equal or greater than 70 to demonstrate improvement in overall condition and self-reported functional ability.    Baseline  58 (03/27/2019); 53 (04/05/2019);    Time  12    Period  Weeks    Status  On-going      PT LONG TERM GOAL #3   Title  Patient will complete 5TSTS in equal or less than 12 seconds with minimal to no shift towards R LE and no UE support to demonstrate improved functional LE strength and power for transfers and daily mobility.    Baseline  16 seconds with B UE push off from 18.5 inch plinth, sisgnificant shift to right (03/27/2019); 18 seconds with  no UE push off from 18 inch chair (04/05/2019);    Time  12    Period  Weeks    Status  Partially Met      PT LONG TERM GOAL #4   Title  Patient will ambulate at least 1000 feet during 6 Minute Walk Test in order to demonstrate ability to complete activities such as shopping and [place] ambulation.    Baseline  able to ambulate 45 feet with CGA and no AD (03/27/2019); 611 feet with SPC and SBA for safety. Demo antalgic gait favoring L LE with limited weight shift to left. (04/05/2019);    Time  12    Period  Weeks    Status  Partially Met      PT LONG TERM GOAL #5   Title  Complete community, work and/or recreational activities without limitation due to current condition.    Baseline  Difficulty with all activities that require weight bearing and ambulation including ADLs, IADLs, household and community ambulation, grocery shopping, cooking, stairs, squatting, cleaning, lifting, bending, etc (03/27/2019); improved ability to don socks (04/05/2019);    Time  12    Period  Weeks    Status  Partially Met            Plan - 04/30/19 1723    Clinical Impression Statement  Patient tolerated treatment well and reported feeling better following manual therapy and at end of session. Avoided some of the knee extension exercises this session due to remaining discomfort following increased intensity with these exercises last session. Patient continues to demonstrate altered gait pattern with B hip abduction weakness, L >R. Patient would benefit from continued management of limiting condition by skilled physical therapist to address remaining impairments and functional limitations to work towards stated goals and return to PLOF or maximal functional independence.    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    Examination-Activity Limitations  Bathing;Bed Mobility;Bend;Locomotion Level;Lift;Stairs;Squat;Hygiene/Grooming;Stand;Toileting;Carry;Transfers;Dressing    Examination-Participation Restrictions  Cleaning;Shop;Yard Work;Meal Prep;Community Activity;Interpersonal Relationship    Stability/Clinical Decision Making  Evolving/Moderate complexity    Rehab Potential  Good    PT Frequency  2x / week    PT Duration  12 weeks    PT Treatment/Interventions  ADLs/Self Care Home Management;Cryotherapy;Electrical Stimulation;Moist Heat;Gait training;Stair training;Functional mobility training;Therapeutic activities;Therapeutic exercise;Balance training;Neuromuscular re-education;Patient/family education;Manual techniques;Scar mobilization;Passive range of motion;Dry needling;Spinal Manipulations;Joint Manipulations    PT Next Visit Plan  progressive LE strengthening, ROM, balance training as tolerated    PT Home Exercise Plan  Medbridge Access Code: [email or web address]    Consulted and Agree with Plan of Care  Patient       Patient will benefit from skilled therapeutic intervention in order to improve the following deficits and impairments:  Abnormal gait, Decreased knowledge of use of DME, Increased fascial restricitons, Pain, Improper body mechanics, Hypermobility, Decreased mobility, Decreased scar mobility, Decreased activity tolerance, Decreased endurance, Decreased range of motion, Decreased strength, Hypomobility, Impaired perceived functional ability, Obesity, Impaired flexibility, Difficulty walking, Decreased balance  Visit Diagnosis: Difficulty in walking, not elsewhere classified  History of falling  Left knee pain, unspecified chronicity  Muscle weakness (generalized)     Problem  List Patient Active Problem List   Diagnosis Date Noted  . Closed displaced supracondylar fracture of distal end of left femur without intracondylar extension ([place]) 02/04/2019  . Femur fracture ([place]) 02/04/2019  . Leukocytosis 02/04/2019  . Alcohol use 02/04/2019  . Right leg weakness 02/14/2018  . Elevated bilirubin 02/14/2018  . SIRS (systemic inflammatory response syndrome) ([place]) 01/30/2018  . Hypoglycemia 01/30/2018  . Lactic acidosis 01/30/2018  . Alcohol abuse 01/30/2018  . Abdominal pain, chronic, epigastric   . Pancreatitis 08/29/2017  . Diabetes ([place]) 08/29/2017  . HTN (hypertension) 08/29/2017  . HLD (hyperlipidemia) 08/29/2017  . GERD (gastroesophageal reflux disease) 08/29/2017  . Hypokalemia 08/29/2017  . Hypocalcemia 08/29/2017  . Primary osteoarthritis of right knee 01/14/2015    Everlean Alstrom. Graylon Good, PT, DPT 04/30/19, 5:26 PM  [facility] [address] [place], [address] Phone: [phone]   Fax:  [phone]  Name: Sheila [name] MRN: [id number] Date of Birth: 11-Apr-1960

## 2019-05-02 ENCOUNTER — Encounter: Payer: Self-pay | Admitting: Physical Therapy

## 2019-05-02 ENCOUNTER — Other Ambulatory Visit: Payer: Self-pay

## 2019-05-02 ENCOUNTER — Ambulatory Visit: Payer: Medicaid Other | Admitting: Physical Therapy

## 2019-05-02 DIAGNOSIS — R262 Difficulty in walking, not elsewhere classified: Secondary | ICD-10-CM

## 2019-05-02 DIAGNOSIS — Z9181 History of falling: Secondary | ICD-10-CM

## 2019-05-02 DIAGNOSIS — M25562 Pain in left knee: Secondary | ICD-10-CM

## 2019-05-02 DIAGNOSIS — M6281 Muscle weakness (generalized): Secondary | ICD-10-CM

## 2019-05-02 NOTE — Therapy (Signed)
[facility] [address] [place], [address] Phone: [phone]   Fax:  [phone]  Physical Therapy Treatment  Patient Details  Name: Sheila [name] MRN: [id number] Date of Birth: October 30, 1960 Referring Provider (PT): Altamese Nason, MD   Encounter Date: 05/02/2019  PT End of Session - 05/02/19 1204    Visit Number  9    Number of Visits  16    Date for PT Re-Evaluation  06/05/19    Authorization Type  Medicaid reporting period from 03/27/2019    Authorization Time Period  CCME auth 3 PT visits 04/02/19 - 04/12/19    Authorization - Visit Number  5    Authorization - Number of Visits  12    Progress Note Due on Visit  10   FOTO due 4/29   PT Start Time  1115    PT Stop Time  1155    PT Time Calculation (min)  40 min    Activity Tolerance  Patient tolerated treatment well;No increased pain    Behavior During Therapy  WFL for tasks assessed/performed       Past Medical History:  Diagnosis Date  . Anemia   . Arthritis   . Asthma   . COPD (chronic obstructive pulmonary disease) ([place])   . DDD (degenerative disc disease), lumbar   . Diabetes mellitus without complication ([place])    on metformin  . DJD (degenerative joint disease)   . Dysrhythmia   . Fatty liver   . Fibroids   . GERD (gastroesophageal reflux disease)   . Glaucoma   . Hyperlipidemia   . Hypertension   . IBS (irritable bowel syndrome)   . Lumbago   . Neuromuscular disorder ([place])   . 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 Renal disorder   . Ulcerative colitis ([place])    on mesalamine    Past Surgical History:  Procedure Laterality Date  . COLONOSCOPY WITH PROPOFOL N/A 08/19/2015   Procedure: COLONOSCOPY WITH PROPOFOL;  Surgeon: Lollie Sails, MD;  Location: [facility] ENDOSCOPY;  Service: Endoscopy;  Laterality: N/A;  . COLONOSCOPY WITH PROPOFOL N/A 12/08/2015   Procedure: COLONOSCOPY WITH PROPOFOL;  Surgeon: Lollie Sails, MD;   Location: [facility] ENDOSCOPY;  Service: Endoscopy;  Laterality: N/A;  . COLONOSCOPY WITH PROPOFOL N/A 12/09/2015   Procedure: COLONOSCOPY WITH PROPOFOL;  Surgeon: Lollie Sails, MD;  Location: [facility] ENDOSCOPY;  Service: Endoscopy;  Laterality: N/A;  . ESOPHAGOGASTRODUODENOSCOPY (EGD) WITH PROPOFOL N/A 11/08/2017   Procedure: ESOPHAGOGASTRODUODENOSCOPY (EGD) WITH PROPOFOL;  Surgeon: Lollie Sails, MD;  Location: [facility] ENDOSCOPY;  Service: Endoscopy;  Laterality: N/A;  . JOINT REPLACEMENT Left 04/19/2006  . JOINT REPLACEMENT Right 01/14/2015  . ORIF FEMUR FRACTURE Left 02/05/2019  . ORIF FEMUR FRACTURE Left 02/05/2019   Procedure: OPEN REDUCTION INTERNAL FIXATION (ORIF) DISTAL FEMUR FRACTURE;  Surgeon: Altamese [place], MD;  Location: [facility];  Service: Orthopedics;  Laterality: Left;  . TOTAL KNEE ARTHROPLASTY Left 2008  . TOTAL KNEE ARTHROPLASTY Right 01/14/2015   Procedure: TOTAL KNEE ARTHROPLASTY;  Surgeon: Hessie Knows, MD;  Location: [facility] ORS;  Service: Orthopedics;  Laterality: Right;    There were no vitals filed for this visit.  Subjective Assessment - 05/02/19 1116    Subjective  Patient reports the pain at her lateral left knee is no longer present. She had a pulling sensation at her left lateral calf (rated it at 10/10) and it is now zero after she took pain medicine. Had no  problems after her last treatment session.    Pertinent History  Patient is a 58 y.o. female who presents to outpatient physical therapy with a referral for medical diagnosis left femur ORIF. This patient's chief complaints consist of left knee pain, stiffness, imbalance, weakness, decreased function and ambulatory ability 2/2 to distal femur fracture from fall with ORIF performed 02/05/2019 leading to the following functional deficits: difficulty with all activities that require weight bearing and ambulation including ADLs, IADLs, household and community ambulation, grocery shopping, cooking, stairs, squatting, cleaning,  lifting, bending, etc. 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 Denies history of MI, cancer, seizures, stroke.    Currently in Pain?  No/denies        OBJECTIVE Observation: no excessive tenderness, swelling, redness, or warmth at left calf. No tenderness to light touch.   - L knee AAROM closed chain flexion = 102  TREATMENT:  Therapeutic exercise:to centralize symptoms and improve ROM, strength, muscular endurance, and activity tolerance required for successful completion of functional activities. - L knee closed chain flexion AAROM on the step, 5 second hold x 20 - R runner's step up (knee high) at stairs to 6 inch step,  contralateral UE support as needed, 2x10 each side. CGA for safety.  - side stepping with yellow theraband around ankles, 2x21 feet each direction. CGA-minA for safety.  - sidelying hip abduction 2x 20 each direction, cuing for improved form.   Neuromuscular Re-education:to improve, balance, postural strength, muscle activation patterns, and stabilization strength required for functional activities (CGA - MinA as needed): - standing alternating cone double taps 2x20 each side  Gait training:to improve gait pattern for improved household and community mobility.  Ambulation practice with no AD and CGA min A as needed: - semi-tandem ambulation on line on [id number], cuing for improved weight shift and foot placement. - ambulation over uevent slanted ground with mulch outside, x100 feet, CGA for safety, no AD.    HOME EXERCISE PROGRAM Access Code: [email or web address]: [email or web address] Date: 04/25/2019 Prepared by: SRosita Kea Exercises Supine Heel Slide with Strap - 2 x daily - 20 reps - 5 seconds hold Sit to Stand  - 2 x daily - 3 sets - 10 reps Standing Hip Abduction with Counter Support - 2 x daily - 3 sets - 10 reps Sidelying Hip Abduction - 1 x daily - 3 sets - 10 reps    PT Education - 05/02/19 1204    Education Details  Exercise purpose/form. Self management techniques    Person(s) Educated  Patient    Methods  Explanation;Demonstration;Tactile cues;Verbal cues    Comprehension  Verbalized understanding;Returned demonstration;Verbal cues required;Tactile cues required;Need further instruction       PT Short Term Goals - 04/05/19 1335      PT SHORT TERM GOAL #1   Title  Be independent with initial home exercise program for self-management of symptoms.    Baseline  Initial HEP provided at IE (03/27/2019);    Time  3    Period  Weeks    Status  Achieved    Target Date  04/17/19        PT Long Term Goals - 04/05/19 1335      PT LONG TERM GOAL #1   Title  Be independent with a long-term home exercise program for self-management  of symptoms.   ALL LONG TERM GOALS TARGET DATE 06/19/2019   Baseline  Initial HEP provided at IE (03/27/2019); currently participating (04/05/2019);    Time  12    Period  Weeks    Status  Partially Met      PT LONG TERM GOAL #2   Title  Demonstrate improved FOTO score to equal or greater than 70 to demonstrate improvement in overall condition and self-reported functional ability.    Baseline  58 (03/27/2019); 53 (04/05/2019);    Time  12    Period  Weeks    Status  On-going      PT LONG TERM GOAL #3   Title  Patient will complete 5TSTS in equal or less than 12 seconds with minimal to no shift towards R LE and no UE support to demonstrate improved functional LE strength and power for transfers and daily mobility.    Baseline  16 seconds with B UE push off from 18.5 inch plinth, sisgnificant shift to right (03/27/2019); 18 seconds with no UE push off from 18 inch chair (04/05/2019);    Time  12    Period  Weeks    Status  Partially Met      PT LONG TERM GOAL #4    Title  Patient will ambulate at least 1000 feet during 6 Minute Walk Test in order to demonstrate ability to complete activities such as shopping and [place] ambulation.    Baseline  able to ambulate 45 feet with CGA and no AD (03/27/2019); 611 feet with SPC and SBA for safety. Demo antalgic gait favoring L LE with limited weight shift to left. (04/05/2019);    Time  12    Period  Weeks    Status  Partially Met      PT LONG TERM GOAL #5   Title  Complete community, work and/or recreational activities without limitation due to current condition.    Baseline  Difficulty with all activities that require weight bearing and ambulation including ADLs, IADLs, household and community ambulation, grocery shopping, cooking, stairs, squatting, cleaning, lifting, bending, etc (03/27/2019); improved ability to don socks (04/05/2019);    Time  12    Period  Weeks    Status  Partially Met            Plan - 05/02/19 1208    Clinical Impression Statement  Patient tolerated treatment well with standing rest breaks between sets of exercise. Minimal sitting rests. Does not have any signs of DVT in L calf. Continued to progress LE balance, functional strength, and motion. Patient complained of increased pain breifly in the lateral anterior L knee after stair flexion stretch but otherwise had no complaint of knee pain during session. Was able to return to more knee dominant exercises but re-introduced them gradually due to reduced tolerance to increasing load with these exercises last week. Reported no pain by end of session but expressed concern she will have pain over weekend. Patient would benefit from continued management of limiting condition by skilled physical therapist to address remaining impairments and functional limitations to work towards stated goals and return to PLOF or maximal functional independence.    Personal Factors and Comorbidities  Age;Comorbidity 3+;Other;Past/Current Experience;Social  Background;Time since onset of injury/illness/exacerbation;Transportation;Fitness    Comorbidities  Relevant past medical history and comorbidities include HTN, COPD, DDD, diabetes mellitus II, DJD, dysrhythmia, fatty liver, GERD, arthritis, recovering alcoholic (sober for last 4 years), renal disorder, ulcerative colitis, hx of bilateral TKA (2008  L and 2017 R).    Examination-Activity Limitations  Bathing;Bed Mobility;Bend;Locomotion Level;Lift;Stairs;Squat;Hygiene/Grooming;Stand;Toileting;Carry;Transfers;Dressing    Examination-Participation Restrictions  Cleaning;Shop;Yard Work;Meal Prep;Community Activity;Interpersonal Relationship    Stability/Clinical Decision Making  Evolving/Moderate complexity    Rehab Potential  Good    PT Frequency  2x / week    PT Duration  12 weeks    PT Treatment/Interventions  ADLs/Self Care Home Management;Cryotherapy;Electrical Stimulation;Moist Heat;Gait training;Stair training;Functional mobility training;Therapeutic activities;Therapeutic exercise;Balance training;Neuromuscular re-education;Patient/family education;Manual techniques;Scar mobilization;Passive range of motion;Dry needling;Spinal Manipulations;Joint Manipulations    PT Next Visit Plan  progressive LE strengthening, ROM, balance training as tolerated    PT Home Exercise Plan  Medbridge Access Code: [email or web address]    Consulted and Agree with Plan of Care  Patient       Patient will benefit from skilled therapeutic intervention in order to improve the following deficits and impairments:  Abnormal gait, Decreased knowledge of use of DME, Increased fascial restricitons, Pain, Improper body mechanics, Hypermobility, Decreased mobility, Decreased scar mobility, Decreased activity tolerance, Decreased endurance, Decreased range of motion, Decreased strength, Hypomobility, Impaired perceived functional ability, Obesity, Impaired flexibility, Difficulty walking, Decreased balance  Visit Diagnosis: Difficulty in  walking, not elsewhere classified  History of falling  Left knee pain, unspecified chronicity  Muscle weakness (generalized)     Problem List Patient Active Problem List   Diagnosis Date Noted  . Closed displaced supracondylar fracture of distal end of left femur without intracondylar extension ([place]) 02/04/2019  . Femur fracture ([place]) 02/04/2019  . Leukocytosis 02/04/2019  . Alcohol use 02/04/2019  . Right leg weakness 02/14/2018  . Elevated bilirubin 02/14/2018  . SIRS (systemic inflammatory response syndrome) ([employer]) 01/30/2018  . Hypoglycemia 01/30/2018  . Lactic acidosis 01/30/2018  . Alcohol abuse 01/30/2018  . Abdominal pain, chronic, epigastric   . Pancreatitis 08/29/2017  . Diabetes ([place]) 08/29/2017  . HTN (hypertension) 08/29/2017  . HLD (hyperlipidemia) 08/29/2017  . GERD (gastroesophageal reflux disease) 08/29/2017  . Hypokalemia 08/29/2017  . Hypocalcemia 08/29/2017  . Primary osteoarthritis of right knee 01/14/2015    Everlean Alstrom. Graylon Good, PT, DPT 05/02/19, 12:09 PM  [facility] [address] [place], [address] Phone: [phone]   Fax:  [phone]  Name: Sheila [name] MRN: [id number] Date of Birth: 07-29-1960

## 2019-05-07 ENCOUNTER — Ambulatory Visit: Payer: Medicaid Other | Admitting: Physical Therapy

## 2019-05-09 ENCOUNTER — Encounter: Payer: Self-pay | Admitting: Physical Therapy

## 2019-05-09 ENCOUNTER — Other Ambulatory Visit: Payer: Self-pay

## 2019-05-09 ENCOUNTER — Ambulatory Visit: Payer: Medicaid Other | Attending: Orthopedic Surgery | Admitting: Physical Therapy

## 2019-05-09 DIAGNOSIS — Z9181 History of falling: Secondary | ICD-10-CM | POA: Diagnosis present

## 2019-05-09 DIAGNOSIS — M6281 Muscle weakness (generalized): Secondary | ICD-10-CM

## 2019-05-09 DIAGNOSIS — M25562 Pain in left knee: Secondary | ICD-10-CM

## 2019-05-09 DIAGNOSIS — R262 Difficulty in walking, not elsewhere classified: Secondary | ICD-10-CM | POA: Diagnosis present

## 2019-05-09 NOTE — Therapy (Signed)
[facility] [address], [place], [address] Phone: [phone]   Fax:  [phone]  Physical Therapy Treatment / Progress Note Reporting Period: 03/27/2019 - 05/09/2019  Patient Details  Name: Sheila [name] MRN: [id number] Date of Birth: 1960/10/25 Referring Provider (PT): Altamese Goshen, MD   Encounter Date: 05/09/2019  PT End of Session - 05/09/19 1439    Visit Number  10    Number of Visits  16    Date for PT Re-Evaluation  06/05/19    Authorization Type  Medicaid reporting period from 03/27/2019    Authorization Time Period  CCME auth 12 PT visits 04/16/19 - 05/27/19    Authorization - Visit Number  6    Authorization - Number of Visits  12    Progress Note Due on Visit  10   FOTO due 05/23/2019   PT Start Time  1350    PT Stop Time  1433    PT Time Calculation (min)  43 min    Activity Tolerance  Patient tolerated treatment well    Behavior During Therapy  [facility] for tasks assessed/performed       Past Medical History:  Diagnosis Date  . Anemia   . Arthritis   . Asthma   . COPD (chronic obstructive pulmonary disease) ([place])   . DDD (degenerative disc disease), lumbar   . Diabetes mellitus without complication ([place])    on metformin  . DJD (degenerative joint disease)   . Dysrhythmia   . Fatty liver   . Fibroids   . GERD (gastroesophageal reflux disease)   . Glaucoma   . Hyperlipidemia   . Hypertension   . IBS (irritable bowel syndrome)   . Lumbago   . Neuromuscular disorder ([place])   . 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[place]) 40/8144  . Renal disorder   . Ulcerative colitis ([place])    on mesalamine    Past Surgical History:  Procedure Laterality Date  . COLONOSCOPY WITH PROPOFOL N/A 08/19/2015   Procedure: COLONOSCOPY WITH PROPOFOL;  Surgeon: Lollie Sails, MD;  Location: [facility] ENDOSCOPY;  Service: Endoscopy;  Laterality: N/A;  . COLONOSCOPY WITH PROPOFOL N/A 12/08/2015   Procedure: COLONOSCOPY WITH  PROPOFOL;  Surgeon: Lollie Sails, MD;  Location: [facility] ENDOSCOPY;  Service: Endoscopy;  Laterality: N/A;  . COLONOSCOPY WITH PROPOFOL N/A 12/09/2015   Procedure: COLONOSCOPY WITH PROPOFOL;  Surgeon: Lollie Sails, MD;  Location: [facility] ENDOSCOPY;  Service: Endoscopy;  Laterality: N/A;  . ESOPHAGOGASTRODUODENOSCOPY (EGD) WITH PROPOFOL N/A 11/08/2017   Procedure: ESOPHAGOGASTRODUODENOSCOPY (EGD) WITH PROPOFOL;  Surgeon: Lollie Sails, MD;  Location: [facility] ENDOSCOPY;  Service: Endoscopy;  Laterality: N/A;  . JOINT REPLACEMENT Left 04/19/2006  . JOINT REPLACEMENT Right 01/14/2015  . ORIF FEMUR FRACTURE Left 02/05/2019  . ORIF FEMUR FRACTURE Left 02/05/2019   Procedure: OPEN REDUCTION INTERNAL FIXATION (ORIF) DISTAL FEMUR FRACTURE;  Surgeon: Altamese [place], MD;  Location: [facility];  Service: Orthopedics;  Laterality: Left;  . TOTAL KNEE ARTHROPLASTY Left 2008  . TOTAL KNEE ARTHROPLASTY Right 01/14/2015   Procedure: TOTAL KNEE ARTHROPLASTY;  Surgeon: Hessie Knows, MD;  Location: [facility] ORS;  Service: Orthopedics;  Laterality: Right;    There were no vitals filed for this visit.  Subjective Assessment - 05/09/19 1353    Subjective  Patient reports she is feeling well today. Reports no pain upon arrival but that it was 8/10 earlier today before she took a pain pill. She continues to use her cane outside of  the house but walks in the house with no cane. reports no excessive pain or soreness following last treatment session. [place] reports her HEP is going well. Sit <> stands and stretching and stanidng one leg are getting  better. The think that still bothers her is really the soreness.  States swelling is going down.    Pertinent History  Patient is a 59 y.o. female who presents to outpatient physical therapy with a referral for medical diagnosis left femur ORIF. This patient's chief complaints consist of left knee pain, stiffness, imbalance, weakness, decreased function and ambulatory ability 2/2 to  distal femur fracture from fall with ORIF performed 02/05/2019 leading to the following functional deficits: difficulty with all activities that require weight bearing and ambulation including ADLs, IADLs, household and community ambulation, grocery shopping, cooking, stairs, squatting, cleaning, lifting, bending, etc. 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 Denies history of MI, cancer, seizures, stroke.    Currently in Pain?  No/denies    Effect of Pain on Daily Activities  no problems with grocery shopping, cooking, cleaning, lifting. Still has some diffiuclty with squatting, bending, stairs are still a bit difficult.      OBJECTIVE  FOTO = 63 (05/09/2019);   OBSERVATION/INSPECTION  Posture: forward head, rounded shoulders, slumped in sitting. Obese.   Tremor: none  Muscle bulk: generally overall mildly decreased consistent with lack of regular adequate exercise or physical activity.   Bed mobility: supine <> sit and rolling I  Transfers: sit <> stand I with mild discomfort  Gait: ambulates 1010 feet with no AD and supervision for safety (although no loss of balance). Demonstrates left trendelenburg gait.   Stairs: step over step ascending with BUE support, antalgic towards L side.    PERIPHERAL JOINT MOTION (in degrees) *Indicates pain, R/L 05/09/2019    Joint/Motion AROM PROM Comments  Knee     Flexion 120/105 125/1010   Extension / 0/22   Comments: B hips and ankles WFL for basic mobility except some decreased DF on L ankle and slight tightness in R hip extension.   MUSCLE PERFORMANCE (MMT):  *Indicates pain 03/27/2019 05/09/19 Date  Joint/Motion R/L R/L R/L  Hip     Flexion (knee flex) 4+/4 4+/4+ /  Extension (knee flex) 3+/4- 4+/2    Extension (knee ext) 4/3 5/3 /  Abduction 4/4 3+/5 /  Knee     Flexion 4+/4 4+/4 /  Extension 5/4 5/4 /  Ankle/Foot     Dorsiflexion 5/4+ 5/4+ /  Plantarflexion 4/4 / /  [place] 4/4- 4+/4+ /  Great toe extension 4/4- 4/4- /  Able to walk on heels and toes but less strong on left with toe walking than right.   PALPATION:  TTP at left lateral knee  FUNCTIONAL/BALANCE TESTS:  5TSTS: 14 seconds with no UE push off from 18 inch plinth  Six Minute Walk Test: 1010 with no AD (SBA for safety but no stumbles). Demo compensated trendelenburg gait suggesting L glute med weakness, mild pain in the left knee.   EDUCATION/COGNITION: Patient is alert and oriented X 4. able to complete FOTO independently today  Objective measurements completed on examination: See above findings.      TREATMENT:  Therapeutic exercise:to centralize symptoms and improve ROM, strength, muscular endurance, and activity tolerance required for successful completion of functional  activities. - L knee closed chain flexion AAROM on the step, 5 second hold x 20 - measurements to assess progress including 6MWT, 5TSTS x2, MMT, ROM (see above). - Education on diagnosis, prognosis, POC, anatomy and physiology of current condition.   Manual therapy: to reduce pain and tissue tension, improve range of motion, neuromodulation, in order to promote improved ability to complete functional activities. Hooklying position:  - L tibofemoral and fibular head joint mobilizations, PA, AP, rotational each way, grade III-IV to improve motion.  - L patellar mobs grade III medial and caudal - STM to soft tissue at lateral L knee, quads, and peripatellar tissue.   HOME EXERCISE PROGRAM Access Code: [email or web address] URL: [email or web address] Date: 04/25/2019 Prepared by: Rosita Kea  Exercises Supine Heel Slide with Strap - 2 x daily - 20 reps - 5 seconds hold Sit to Stand - 2 x daily - 3 sets - 10  reps Standing Hip Abduction with Counter Support - 2 x daily - 3 sets - 10 reps Sidelying Hip Abduction - 1 x daily - 3 sets - 10 reps  Patient response to treatment:  Pt tolerated treatment well. Pt was able to complete all exercises with minimal to no lasting increase in pain or discomfort. Pt required multimodal cuing for proper technique and to facilitate improved neuromuscular control, strength, and functional ability resulting in improved performance and form.    PT Education - 05/09/19 1448    Education Details  Exercise purpose/form. Self management techniques. Education on diagnosis, prognosis, POC, anatomy and physiology of current condition.    Person(s) Educated  Patient    Methods  Explanation;Demonstration;Tactile cues;Verbal cues    Comprehension  Verbalized understanding;Returned demonstration;Verbal cues required;Tactile cues required;Need further instruction       PT Short Term Goals - 04/05/19 1335      PT SHORT TERM GOAL #1   Title  Be independent with initial home exercise program for self-management of symptoms.    Baseline  Initial HEP provided at IE (03/27/2019);    Time  3    Period  Weeks    Status  Achieved    Target Date  04/17/19        PT Long Term Goals - 05/09/19 1449      PT LONG TERM GOAL #1   Title  Be independent with a long-term home exercise program for self-management of symptoms.   ALL LONG TERM GOALS TARGET DATE 06/19/2019   Baseline  Initial HEP provided at IE (03/27/2019); currently participating (04/05/2019; 05/09/2019);    Time  12    Period  Weeks    Status  Partially Met      PT LONG TERM GOAL #2   Title  Demonstrate improved FOTO score to equal or greater than 70 to demonstrate improvement in overall condition and self-reported functional ability.    Baseline  58 (03/27/2019); 53 (04/05/2019); 63 (05/09/2019);    Time  12    Period  Weeks    Status  Partially Met      PT LONG TERM GOAL #3   Title  Patient will complete 5TSTS in equal  or less than 12 seconds with minimal to no shift towards R LE and no UE support to demonstrate improved functional LE strength and power for transfers and daily mobility.    Baseline  16 seconds with B UE push off from 18.5 inch plinth, sisgnificant shift to right (03/27/2019); 18 seconds with no UE push off from 18 inch chair (04/05/2019);  14 seconds with no UE support from 18 inch chair (05/09/2019);    Time  12    Period  Weeks    Status  Partially Met      PT LONG TERM GOAL #4   Title  Patient will ambulate at least 1000 feet during 6 Minute Walk Test in order to demonstrate ability to complete activities such as shopping and [place] ambulation.    Baseline  able to ambulate 45 feet with CGA and no AD (03/27/2019); 611 feet with SPC and SBA for safety. Demo antalgic gait favoring L LE with limited weight shift to left. (04/05/2019);    Time  12    Period  Weeks    Status  Achieved      PT LONG TERM GOAL #5   Title  Complete community, work and/or recreational activities without limitation due to current condition.    Baseline  Difficulty with all activities that require weight bearing and ambulation including ADLs, IADLs, household and community ambulation, grocery shopping, cooking, stairs, squatting, cleaning, lifting, bending, etc (03/27/2019); improved ability to don socks (04/05/2019); improved but still has some difficulty with stairs, bending, and lifting (05/09/2019);    Time  12    Period  Weeks    Status  Partially Met            Plan - 05/09/19 1453    Clinical Impression Statement  Patient has attended 10 physical therapy treatment sessions and continues to make steady progress towards goals. She has improved FOTO score, 5TSTS test, 6MWT, ability to complete stairs, ambulates without AD in clinic, and improved MMT. She has not made any significant progress on left knee ROM and end range flexion has firm end feel, suggesting hard block to progress there. Patient continues to have  an altered gait pattern and still has difficulty with stairs, lifting, and bending. She has not yet returned to PLOF. Patient would benefit from continued management of limiting condition by skilled physical therapist to address remaining impairments and functional limitations to work towards stated goals and return to PLOF or maximal functional independence.    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    Examination-Activity Limitations  Bathing;Bed Mobility;Bend;Locomotion Level;Lift;Stairs;Squat;Hygiene/Grooming;Stand;Toileting;Carry;Transfers;Dressing    Examination-Participation Restrictions  Cleaning;Shop;Yard Work;Meal Prep;Community Activity;Interpersonal Relationship    Stability/Clinical Decision Making  Evolving/Moderate complexity    Rehab Potential  Good    PT Frequency  2x / week    PT Duration  12 weeks    PT Treatment/Interventions  ADLs/Self Care Home Management;Cryotherapy;Electrical Stimulation;Moist Heat;Gait training;Stair training;Functional mobility training;Therapeutic activities;Therapeutic exercise;Balance training;Neuromuscular re-education;Patient/family education;Manual techniques;Scar mobilization;Passive range of motion;Dry needling;Spinal Manipulations;Joint Manipulations    PT Next Visit Plan  progressive LE strengthening, ROM, balance training as tolerated    PT Home Exercise Plan  Medbridge Access Code: [email or web address]    Consulted and Agree with Plan of Care  Patient       Patient will benefit from skilled therapeutic intervention in order to improve the  following deficits and impairments:  Abnormal gait, Decreased knowledge of use of DME, Increased fascial restricitons, Pain, Improper body mechanics, Hypermobility, Decreased mobility, Decreased scar mobility, Decreased activity tolerance, Decreased endurance, Decreased range of motion, Decreased strength,  Hypomobility, Impaired perceived functional ability, Obesity, Impaired flexibility, Difficulty walking, Decreased balance  Visit Diagnosis: Difficulty in walking, not elsewhere classified  History of falling  Left knee pain, unspecified chronicity  Muscle weakness (generalized)     Problem List Patient Active Problem List   Diagnosis Date Noted  . Closed displaced supracondylar fracture of distal end of left femur without intracondylar extension ([place]) 02/04/2019  . Femur fracture ([place]) 02/04/2019  . Leukocytosis 02/04/2019  . Alcohol use 02/04/2019  . Right leg weakness 02/14/2018  . Elevated bilirubin 02/14/2018  . SIRS (systemic inflammatory response syndrome) ([place]) 01/30/2018  . Hypoglycemia 01/30/2018  . Lactic acidosis 01/30/2018  . Alcohol abuse 01/30/2018  . Abdominal pain, chronic, epigastric   . Pancreatitis 08/29/2017  . Diabetes ([place]) 08/29/2017  . HTN (hypertension) 08/29/2017  . HLD (hyperlipidemia) 08/29/2017  . GERD (gastroesophageal reflux disease) 08/29/2017  . Hypokalemia 08/29/2017  . Hypocalcemia 08/29/2017  . Primary osteoarthritis of right knee 01/14/2015    Everlean Alstrom. Graylon Good, PT, DPT 05/09/19, 2:54 PM  [facility] [address] [place], [address] Phone: [phone]   Fax:  [phone]  Name: Sheila [name] MRN: [id number] Date of Birth: 14-Aug-1960

## 2019-05-14 ENCOUNTER — Ambulatory Visit: Payer: Medicaid Other | Admitting: Physical Therapy

## 2019-05-14 ENCOUNTER — Encounter: Payer: Self-pay | Admitting: Physical Therapy

## 2019-05-14 ENCOUNTER — Other Ambulatory Visit: Payer: Self-pay

## 2019-05-14 ENCOUNTER — Ambulatory Visit: Payer: Medicaid Other

## 2019-05-14 DIAGNOSIS — R262 Difficulty in walking, not elsewhere classified: Secondary | ICD-10-CM | POA: Diagnosis not present

## 2019-05-14 DIAGNOSIS — Z9181 History of falling: Secondary | ICD-10-CM

## 2019-05-14 DIAGNOSIS — M25562 Pain in left knee: Secondary | ICD-10-CM

## 2019-05-14 DIAGNOSIS — M6281 Muscle weakness (generalized): Secondary | ICD-10-CM

## 2019-05-14 NOTE — Therapy (Signed)
[facility] [address] [place], [address] Phone: [phone]   Fax:  [phone]  Physical Therapy Treatment  Patient Details  Name: Sheila [name] MRN: [id number] Date of Birth: 06-09-1960 Referring Provider (PT): Altamese Belmont, MD   Encounter Date: 05/14/2019  PT End of Session - 05/14/19 1541    Visit Number  11    Number of Visits  16    Date for PT Re-Evaluation  06/05/19    Authorization Type  Medicaid reporting period from 03/27/2019    Authorization Time Period  CCME auth 12 PT visits 04/16/19 - 05/27/19    Authorization - Visit Number  7    Authorization - Number of Visits  12    Progress Note Due on Visit  10   FOTO due 05/23/2019   PT Start Time  1452    PT Stop Time  1530    PT Time Calculation (min)  38 min    Activity Tolerance  Patient tolerated treatment well    Behavior During Therapy  [facility] for tasks assessed/performed       Past Medical History:  Diagnosis Date  . Anemia   . Arthritis   . Asthma   . COPD (chronic obstructive pulmonary disease) ([place])   . DDD (degenerative disc disease), lumbar   . Diabetes mellitus without complication ([place])    on metformin  . DJD (degenerative joint disease)   . Dysrhythmia   . Fatty liver   . Fibroids   . GERD (gastroesophageal reflux disease)   . Glaucoma   . Hyperlipidemia   . Hypertension   . IBS (irritable bowel syndrome)   . Lumbago   . Neuromuscular disorder ([place])   . 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[place]) 03/8880  . Renal disorder   . Ulcerative colitis ([place])    on mesalamine    Past Surgical History:  Procedure Laterality Date  . COLONOSCOPY WITH PROPOFOL N/A 08/19/2015   Procedure: COLONOSCOPY WITH PROPOFOL;  Surgeon: Lollie Sails, MD;  Location: [facility] ENDOSCOPY;  Service: Endoscopy;  Laterality: N/A;  . COLONOSCOPY WITH PROPOFOL N/A 12/08/2015   Procedure: COLONOSCOPY WITH PROPOFOL;  Surgeon: Lollie Sails, MD;  Location: [facility]  ENDOSCOPY;  Service: Endoscopy;  Laterality: N/A;  . COLONOSCOPY WITH PROPOFOL N/A 12/09/2015   Procedure: COLONOSCOPY WITH PROPOFOL;  Surgeon: Lollie Sails, MD;  Location: [facility] ENDOSCOPY;  Service: Endoscopy;  Laterality: N/A;  . ESOPHAGOGASTRODUODENOSCOPY (EGD) WITH PROPOFOL N/A 11/08/2017   Procedure: ESOPHAGOGASTRODUODENOSCOPY (EGD) WITH PROPOFOL;  Surgeon: Lollie Sails, MD;  Location: [facility] ENDOSCOPY;  Service: Endoscopy;  Laterality: N/A;  . JOINT REPLACEMENT Left 04/19/2006  . JOINT REPLACEMENT Right 01/14/2015  . ORIF FEMUR FRACTURE Left 02/05/2019  . ORIF FEMUR FRACTURE Left 02/05/2019   Procedure: OPEN REDUCTION INTERNAL FIXATION (ORIF) DISTAL FEMUR FRACTURE;  Surgeon: Altamese Selden, MD;  Location: [facility];  Service: Orthopedics;  Laterality: Left;  . TOTAL KNEE ARTHROPLASTY Left 2008  . TOTAL KNEE ARTHROPLASTY Right 01/14/2015   Procedure: TOTAL KNEE ARTHROPLASTY;  Surgeon: Hessie Knows, MD;  Location: [facility] ORS;  Service: Orthopedics;  Laterality: Right;    There were no vitals filed for this visit.  Subjective Assessment - 05/14/19 1539    Subjective  Patient reports 4/10 pain at lateral L knee today. States it was higher earlier but has improved once she is up and moving around. Reports no excessive pain or soreness following last treatment sesession. Was able to attend two cook outs  this weekend successfully. Continues to use SPC outside.    Pertinent History  Patient is a 59 y.o. female who presents to outpatient physical therapy with a referral for medical diagnosis left femur ORIF. This patient's chief complaints consist of left knee pain, stiffness, imbalance, weakness, decreased function and ambulatory ability 2/2 to distal femur fracture from fall with ORIF performed 02/05/2019 leading to the following functional deficits: difficulty with all activities that require weight bearing and ambulation including ADLs, IADLs, household and community ambulation, grocery shopping,  cooking, stairs, squatting, cleaning, lifting, bending, etc. 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 Denies history of MI, cancer, seizures, stroke.    Currently in Pain?  Yes    Pain Score  4     Pain Location  Knee    Pain Orientation  Left       TREATMENT:  Therapeutic exercise:to centralize symptoms and improve ROM, strength, muscular endurance, and activity tolerance required for successful completion of functional activities. - L knee closed chain flexion AAROM on the step,5second hold x 20 - recumbent bike pedaling no resistance on seat setting as close as possible to stretch L knee flexion (seat setting 6 x 3 min, setting 5 x 1 min, setting 3 x 2 min). To improve ROM for L knee flexion.  - L runner's step up (R knee high) at stairs to 6 inch step, contralateral UE support as needed hand to 1 finger, 3x10 each side. SBA-CGA for safety. Last set with no UE support.  - semi-tandem ambulationon line on [id number], cuing for improved weight shift and foot placement. - side stepping with yellow theraband around ankles, 1x21 feet each direction. CGA-minA for safety and to maintain form.  - monster walks, 2x21 feet with yellow theraband around ankles and CGA for safety.    - Education on diagnosis, prognosis, POC, anatomy and physiology of current condition.   Manual therapy: to reduce pain and tissue tension, improve range of motion, neuromodulation, in order to promote improved ability to complete functional activities. Seated position:  - L tibofemoral distraction and external rotation, grade III-IV to improve motion. (pt report it feels good ) - STM to soft tissue at lateral L knee over incision, at lateral quads,    PT Education - 05/14/19 1541     Education Details  Exercise purpose/form. Self management techniques.    Person(s) Educated  Patient    Methods  Explanation;Demonstration;Tactile cues;Verbal cues    Comprehension  Verbalized understanding;Returned demonstration;Verbal cues required;Tactile cues required;Need further instruction       PT Short Term Goals - 04/05/19 1335      PT SHORT TERM GOAL #1   Title  Be independent with initial home exercise program for self-management of symptoms.    Baseline  Initial HEP provided at IE (03/27/2019);    Time  3    Period  Weeks    Status  Achieved    Target Date  04/17/19        PT Long Term Goals - 05/09/19 1449      PT LONG TERM GOAL #1   Title  Be independent with a long-term home exercise program for self-management of symptoms.   ALL LONG TERM GOALS TARGET DATE 06/19/2019   Baseline  Initial HEP provided at IE (03/27/2019); currently participating (04/05/2019; 05/09/2019);  Time  12    Period  Weeks    Status  Partially Met      PT LONG TERM GOAL #2   Title  Demonstrate improved FOTO score to equal or greater than 70 to demonstrate improvement in overall condition and self-reported functional ability.    Baseline  58 (03/27/2019); 53 (04/05/2019); 63 (05/09/2019);    Time  12    Period  Weeks    Status  Partially Met      PT LONG TERM GOAL #3   Title  Patient will complete 5TSTS in equal or less than 12 seconds with minimal to no shift towards R LE and no UE support to demonstrate improved functional LE strength and power for transfers and daily mobility.    Baseline  16 seconds with B UE push off from 18.5 inch plinth, sisgnificant shift to right (03/27/2019); 18 seconds with no UE push off from 18 inch chair (04/05/2019); 14 seconds with no UE support from 18 inch chair (05/09/2019);    Time  12    Period  Weeks    Status  Partially Met      PT LONG TERM GOAL #4   Title  Patient will ambulate at least 1000 feet during 6 Minute Walk Test in order to demonstrate ability to  complete activities such as shopping and [place] ambulation.    Baseline  able to ambulate 45 feet with CGA and no AD (03/27/2019); 611 feet with SPC and SBA for safety. Demo antalgic gait favoring L LE with limited weight shift to left. (04/05/2019);    Time  12    Period  Weeks    Status  Achieved      PT LONG TERM GOAL #5   Title  Complete community, work and/or recreational activities without limitation due to current condition.    Baseline  Difficulty with all activities that require weight bearing and ambulation including ADLs, IADLs, household and community ambulation, grocery shopping, cooking, stairs, squatting, cleaning, lifting, bending, etc (03/27/2019); improved ability to don socks (04/05/2019); improved but still has some difficulty with stairs, bending, and lifting (05/09/2019);    Time  12    Period  Weeks    Status  Partially Met            Plan - 05/14/19 1539    Clinical Impression Statement  Patient tolerated treatment well overall and reported decrease in pain from 4/10 to 0/10 by end of session. Continues to lack L knee flexion despite efforts to improve ROM. Pt continues to demo quick fatigue and lack of strength in L hip abductor and extensor that affects her gait and functional mobility. Patient would benefit from continued management of limiting condition by skilled physical therapist to address remaining impairments and functional limitations to work towards stated goals and return to PLOF or maximal functional independence.    Personal Factors and Comorbidities  Age;Comorbidity 3+;Other;Past/Current Experience;Social Background;Time since onset of injury/illness/exacerbation;Transportation;Fitness    Comorbidities  Relevant past medical history and comorbidities include HTN, COPD, DDD, diabetes mellitus II, DJD, dysrhythmia, fatty liver, GERD, arthritis, recovering alcoholic (sober for last 4 years), renal disorder, ulcerative colitis, hx of bilateral TKA (2008 L  and 2017 R).    Examination-Activity Limitations  Bathing;Bed Mobility;Bend;Locomotion Level;Lift;Stairs;Squat;Hygiene/Grooming;Stand;Toileting;Carry;Transfers;Dressing    Examination-Participation Restrictions  Cleaning;Shop;Yard Work;Meal Prep;Community Activity;Interpersonal Relationship    Stability/Clinical Decision Making  Evolving/Moderate complexity    Rehab Potential  Good    PT Frequency  2x / week  PT Duration  12 weeks    PT Treatment/Interventions  ADLs/Self Care Home Management;Cryotherapy;Electrical Stimulation;Moist Heat;Gait training;Stair training;Functional mobility training;Therapeutic activities;Therapeutic exercise;Balance training;Neuromuscular re-education;Patient/family education;Manual techniques;Scar mobilization;Passive range of motion;Dry needling;Spinal Manipulations;Joint Manipulations    PT Next Visit Plan  progressive LE strengthening, ROM, balance training as tolerated    PT Home Exercise Plan  Medbridge Access Code: [email or web address]    Consulted and Agree with Plan of Care  Patient       Patient will benefit from skilled therapeutic intervention in order to improve the following deficits and impairments:  Abnormal gait, Decreased knowledge of use of DME, Increased fascial restricitons, Pain, Improper body mechanics, Hypermobility, Decreased mobility, Decreased scar mobility, Decreased activity tolerance, Decreased endurance, Decreased range of motion, Decreased strength, Hypomobility, Impaired perceived functional ability, Obesity, Impaired flexibility, Difficulty walking, Decreased balance  Visit Diagnosis: Difficulty in walking, not elsewhere classified  History of falling  Left knee pain, unspecified chronicity  Muscle weakness (generalized)     Problem List Patient Active Problem List   Diagnosis Date Noted  . Closed displaced supracondylar fracture of distal end of left femur without intracondylar extension ([place]) 02/04/2019  . Femur fracture ([place])  02/04/2019  . Leukocytosis 02/04/2019  . Alcohol use 02/04/2019  . Right leg weakness 02/14/2018  . Elevated bilirubin 02/14/2018  . SIRS (systemic inflammatory response syndrome) ([place]) 01/30/2018  . Hypoglycemia 01/30/2018  . Lactic acidosis 01/30/2018  . Alcohol abuse 01/30/2018  . Abdominal pain, chronic, epigastric   . Pancreatitis 08/29/2017  . Diabetes ([place]) 08/29/2017  . HTN (hypertension) 08/29/2017  . HLD (hyperlipidemia) 08/29/2017  . GERD (gastroesophageal reflux disease) 08/29/2017  . Hypokalemia 08/29/2017  . Hypocalcemia 08/29/2017  . Primary osteoarthritis of right knee 01/14/2015    Everlean Alstrom. Graylon Good, PT, DPT 05/14/19, 3:43 PM  [facility] [address] [place], [address] Phone: [phone]   Fax:  [phone]  Name: LYNETTE TOPETE MRN: [id number] Date of Birth: 05-Jul-1960

## 2019-05-16 ENCOUNTER — Ambulatory Visit: Payer: Medicaid Other | Admitting: Physical Therapy

## 2019-05-21 ENCOUNTER — Ambulatory Visit: Payer: Medicaid Other | Admitting: Physical Therapy

## 2019-05-21 ENCOUNTER — Other Ambulatory Visit: Payer: Self-pay

## 2019-05-21 ENCOUNTER — Encounter: Payer: Self-pay | Admitting: Physical Therapy

## 2019-05-21 DIAGNOSIS — R262 Difficulty in walking, not elsewhere classified: Secondary | ICD-10-CM

## 2019-05-21 DIAGNOSIS — M6281 Muscle weakness (generalized): Secondary | ICD-10-CM

## 2019-05-21 DIAGNOSIS — Z9181 History of falling: Secondary | ICD-10-CM

## 2019-05-21 DIAGNOSIS — M25562 Pain in left knee: Secondary | ICD-10-CM

## 2019-05-21 NOTE — Therapy (Signed)
[facility] [address], [place], [address] Phone: [phone]   Fax:  [phone]  Physical Therapy Treatment  Patient Details  Name: Sheila [name] MRN: [id number] Date of Birth: 1960-04-10 Referring Provider (PT): Altamese Paint, MD   Encounter Date: 05/21/2019  PT End of Session - 05/21/19 1402    Visit Number  12    Number of Visits  16    Date for PT Re-Evaluation  06/05/19    Authorization Type  Medicaid reporting period from 03/27/2019    Authorization Time Period  CCME auth 12 PT visits 04/16/19 - 05/27/19    Authorization - Visit Number  8    Authorization - Number of Visits  12    Progress Note Due on Visit  10   FOTO due 05/23/2019   PT Start Time  1352    PT Stop Time  1430    PT Time Calculation (min)  38 min    Activity Tolerance  Patient tolerated treatment well    Behavior During Therapy  [facility] for tasks assessed/performed       Past Medical History:  Diagnosis Date  . Anemia   . Arthritis   . Asthma   . COPD (chronic obstructive pulmonary disease) ([place])   . DDD (degenerative disc disease), lumbar   . Diabetes mellitus without complication ([place])    on metformin  . DJD (degenerative joint disease)   . Dysrhythmia   . Fatty liver   . Fibroids   . GERD (gastroesophageal reflux disease)   . Glaucoma   . Hyperlipidemia   . Hypertension   . IBS (irritable bowel syndrome)   . Lumbago   . Neuromuscular disorder ([place])   . 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[place]) 56/4332  . Renal disorder   . Ulcerative colitis ([place])    on mesalamine    Past Surgical History:  Procedure Laterality Date  . COLONOSCOPY WITH PROPOFOL N/A 08/19/2015   Procedure: COLONOSCOPY WITH PROPOFOL;  Surgeon: Lollie Sails, MD;  Location: [facility] ENDOSCOPY;  Service: Endoscopy;  Laterality: N/A;  . COLONOSCOPY WITH PROPOFOL N/A 12/08/2015   Procedure: COLONOSCOPY WITH PROPOFOL;  Surgeon: Lollie Sails, MD;  Location: [facility]  ENDOSCOPY;  Service: Endoscopy;  Laterality: N/A;  . COLONOSCOPY WITH PROPOFOL N/A 12/09/2015   Procedure: COLONOSCOPY WITH PROPOFOL;  Surgeon: Lollie Sails, MD;  Location: [facility] ENDOSCOPY;  Service: Endoscopy;  Laterality: N/A;  . ESOPHAGOGASTRODUODENOSCOPY (EGD) WITH PROPOFOL N/A 11/08/2017   Procedure: ESOPHAGOGASTRODUODENOSCOPY (EGD) WITH PROPOFOL;  Surgeon: Lollie Sails, MD;  Location: [facility] ENDOSCOPY;  Service: Endoscopy;  Laterality: N/A;  . JOINT REPLACEMENT Left 04/19/2006  . JOINT REPLACEMENT Right 01/14/2015  . ORIF FEMUR FRACTURE Left 02/05/2019  . ORIF FEMUR FRACTURE Left 02/05/2019   Procedure: OPEN REDUCTION INTERNAL FIXATION (ORIF) DISTAL FEMUR FRACTURE;  Surgeon: Altamese Underwood, MD;  Location: [place];  Service: Orthopedics;  Laterality: Left;  . TOTAL KNEE ARTHROPLASTY Left 2008  . TOTAL KNEE ARTHROPLASTY Right 01/14/2015   Procedure: TOTAL KNEE ARTHROPLASTY;  Surgeon: Hessie Knows, MD;  Location: [facility] ORS;  Service: Orthopedics;  Laterality: Right;    There were no vitals filed for this visit.  Subjective Assessment - 05/21/19 1359    Subjective  Patient report she is feeling well today with no pain upon arrival. She reports a bit of soreness in the back of her left lower leg following last treatment session (better in 2 days). She had a cookout with her daughter  this weekend. She missed her last PT appointment because the [facility] never came to pick her up. She requests to use the recumbent bike or nustep this session.    Pertinent History  Patient is a 59 y.o. female who presents to outpatient physical therapy with a referral for medical diagnosis left femur ORIF. This patient's chief complaints consist of left knee pain, stiffness, imbalance, weakness, decreased function and ambulatory ability 2/2 to distal femur fracture from fall with ORIF performed 02/05/2019 leading to the following functional deficits: difficulty with all activities that require weight bearing and ambulation  including ADLs, IADLs, household and community ambulation, grocery shopping, cooking, stairs, squatting, cleaning, lifting, bending, etc. 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 Denies history of MI, cancer, seizures, stroke.    Currently in Pain?  No/denies      OBJECTIVE FOTO = 70 (05/21/2019)   TREATMENT:  Therapeutic exercise:to centralize symptoms and improve ROM, strength, muscular endurance, and activity tolerance required for successful completion of functional activities. - recumbent bike pedaling no resistance on seat setting as close as possible to stretch L knee flexion (seat setting 6 x 2 min, setting 8 x 3 min, setting 4 x 2 min, setting 2 x 2 min, setting 1 x2 min). To improve ROM for L knee flexion.  - L knee closed chain flexion AAROM on the step,5second hold x 20 -L runner's step up (R knee high) at stairs to 6 inch step, contralateral UEsupport as needed hand to 1 finger,3x10each side. SBA-CGA for safety. Last set with no UE support.  - side stepping with yellow theraband around ankles,1x21 feet each direction. CGA-minA for safety and to maintain form.  -Education on diagnosis, prognosis, POC, anatomy and physiology of current condition.  Manual therapy:to reduce pain and tissue tension, improve range of motion, neuromodulation, in order to promote improved ability to complete functional activities. Seated position:  - L tibofemoral distraction and external rotation, grade III-IV to improve motion. (pt report it feels good ) Hooklying position: - STM to soft tissue at lateral L knee over incision, at lateral quads, - L patellar joint mobilization medial and cephalic grade III - tibiofemoral AP and PA mobs grade III-IV - PROM flexion with  OP x 10    PT Education - 05/21/19 1402    Education Details  Exercise purpose/form. Self management techniques.    Person(s) Educated  Patient    Methods  Explanation;Demonstration;Tactile cues;Verbal cues    Comprehension  Verbalized understanding;Returned demonstration;Verbal cues required;Tactile cues required;Need further instruction       PT Short Term Goals - 04/05/19 1335      PT SHORT TERM GOAL #1   Title  Be independent with initial home exercise program for self-management of symptoms.    Baseline  Initial HEP provided at IE (03/27/2019);    Time  3    Period  Weeks    Status  Achieved    Target Date  04/17/19        PT Long Term Goals - 05/09/19 1449      PT LONG TERM GOAL #1   Title  Be independent with a long-term home exercise program for self-management of symptoms.   ALL LONG TERM GOALS TARGET DATE 06/19/2019   Baseline  Initial HEP provided at IE (03/27/2019); currently participating (04/05/2019;  05/09/2019);    Time  12    Period  Weeks    Status  Partially Met      PT LONG TERM GOAL #2   Title  Demonstrate improved FOTO score to equal or greater than 70 to demonstrate improvement in overall condition and self-reported functional ability.    Baseline  58 (03/27/2019); 53 (04/05/2019); 63 (05/09/2019);    Time  12    Period  Weeks    Status  Partially Met      PT LONG TERM GOAL #3   Title  Patient will complete 5TSTS in equal or less than 12 seconds with minimal to no shift towards R LE and no UE support to demonstrate improved functional LE strength and power for transfers and daily mobility.    Baseline  16 seconds with B UE push off from 18.5 inch plinth, sisgnificant shift to right (03/27/2019); 18 seconds with no UE push off from 18 inch chair (04/05/2019); 14 seconds with no UE support from 18 inch chair (05/09/2019);    Time  12    Period  Weeks    Status  Partially Met      PT LONG TERM GOAL #4   Title  Patient will ambulate at least 1000 feet during 6  Minute Walk Test in order to demonstrate ability to complete activities such as shopping and [place] ambulation.    Baseline  able to ambulate 45 feet with CGA and no AD (03/27/2019); 611 feet with SPC and SBA for safety. Demo antalgic gait favoring L LE with limited weight shift to left. (04/05/2019);    Time  12    Period  Weeks    Status  Achieved      PT LONG TERM GOAL #5   Title  Complete community, work and/or recreational activities without limitation due to current condition.    Baseline  Difficulty with all activities that require weight bearing and ambulation including ADLs, IADLs, household and community ambulation, grocery shopping, cooking, stairs, squatting, cleaning, lifting, bending, etc (03/27/2019); improved ability to don socks (04/05/2019); improved but still has some difficulty with stairs, bending, and lifting (05/09/2019);    Time  12    Period  Weeks    Status  Partially Met            Plan - 05/21/19 1406    Clinical Impression Statement  Patient tolerated treatment well overall this session with some discomfort at the posterior L knee with ROM exercise at the recumbent bike. Continued interventions for strengthening and ROM this session in preparation for planned discharge next session. Patient would benefit from continued management of limiting condition by skilled physical therapist to address remaining impairments and functional limitations to work towards stated goals and return to PLOF or maximal functional independence.    Personal Factors and Comorbidities  Age;Comorbidity 3+;Other;Past/Current Experience;Social Background;Time since onset of injury/illness/exacerbation;Transportation;Fitness    Comorbidities  Relevant past medical history and comorbidities include HTN, COPD, DDD, diabetes mellitus II, DJD, dysrhythmia, fatty liver, GERD, arthritis, recovering alcoholic (sober for last 4 years), renal disorder, ulcerative colitis, hx of bilateral TKA (2008 L and  2017 R).    Examination-Activity Limitations  Bathing;Bed Mobility;Bend;Locomotion Level;Lift;Stairs;Squat;Hygiene/Grooming;Stand;Toileting;Carry;Transfers;Dressing    Examination-Participation Restrictions  Cleaning;Shop;Yard Work;Meal Prep;Community Activity;Interpersonal Relationship    Stability/Clinical Decision Making  Evolving/Moderate complexity    Rehab Potential  Good    PT Frequency  2x / week    PT Duration  12 weeks    PT Treatment/Interventions  ADLs/Self Care Home Management;Cryotherapy;Electrical Stimulation;Moist Heat;Gait training;Stair training;Functional mobility training;Therapeutic activities;Therapeutic exercise;Balance training;Neuromuscular re-education;Patient/family education;Manual techniques;Scar mobilization;Passive range of motion;Dry needling;Spinal Manipulations;Joint Manipulations    PT Next Visit Plan  progressive LE strengthening, ROM, balance training as tolerated    PT Home Exercise Plan  Medbridge Access Code: [email or web address]    Consulted and Agree with Plan of Care  Patient       Patient will benefit from skilled therapeutic intervention in order to improve the following deficits and impairments:  Abnormal gait, Decreased knowledge of use of DME, Increased fascial restricitons, Pain, Improper body mechanics, Hypermobility, Decreased mobility, Decreased scar mobility, Decreased activity tolerance, Decreased endurance, Decreased range of motion, Decreased strength, Hypomobility, Impaired perceived functional ability, Obesity, Impaired flexibility, Difficulty walking, Decreased balance  Visit Diagnosis: Difficulty in walking, not elsewhere classified  History of falling  Left knee pain, unspecified chronicity  Muscle weakness (generalized)     Problem List Patient Active Problem List   Diagnosis Date Noted  . Closed displaced supracondylar fracture of distal end of left femur without intracondylar extension ([place]) 02/04/2019  . Femur fracture ([place])  02/04/2019  . Leukocytosis 02/04/2019  . Alcohol use 02/04/2019  . Right leg weakness 02/14/2018  . Elevated bilirubin 02/14/2018  . SIRS (systemic inflammatory response syndrome) ([place]) 01/30/2018  . Hypoglycemia 01/30/2018  . Lactic acidosis 01/30/2018  . Alcohol abuse 01/30/2018  . Abdominal pain, chronic, epigastric   . Pancreatitis 08/29/2017  . Diabetes ([place]) 08/29/2017  . HTN (hypertension) 08/29/2017  . HLD (hyperlipidemia) 08/29/2017  . GERD (gastroesophageal reflux disease) 08/29/2017  . Hypokalemia 08/29/2017  . Hypocalcemia 08/29/2017  . Primary osteoarthritis of right knee 01/14/2015    Nancy Nordmann 05/21/2019, 2:27 PM  [facility] [address], [place], [address] Phone: [phone]   Fax:  [phone]  Name: Sheila [name] MRN: [id number] Date of Birth: 11-Sep-1960

## 2019-05-23 ENCOUNTER — Encounter: Payer: Self-pay | Admitting: Physical Therapy

## 2019-05-23 ENCOUNTER — Ambulatory Visit: Payer: Medicaid Other | Admitting: Physical Therapy

## 2019-05-23 ENCOUNTER — Other Ambulatory Visit: Payer: Self-pay

## 2019-05-23 DIAGNOSIS — R262 Difficulty in walking, not elsewhere classified: Secondary | ICD-10-CM | POA: Diagnosis not present

## 2019-05-23 DIAGNOSIS — M25562 Pain in left knee: Secondary | ICD-10-CM

## 2019-05-23 DIAGNOSIS — Z9181 History of falling: Secondary | ICD-10-CM

## 2019-05-23 DIAGNOSIS — M6281 Muscle weakness (generalized): Secondary | ICD-10-CM

## 2019-05-23 NOTE — Therapy (Addendum)
[facility] [address], [place], [address] Phone: [phone]   Fax:  [phone]  Physical Therapy Treatment / Progress Note / Re-Certification Reporting Period: 03/27/2019 - 05/23/2019  Patient Details  Name: Sheila [name] MRN: [id number] Date of Birth: 1960/02/13 Referring Provider (PT): Altamese [place], MD   Encounter Date: 05/23/2019  PT End of Session - 05/23/19 1420    Visit Number  13    Number of Visits  16    Date for PT Re-Evaluation  06/05/19    Authorization Type  Medicaid reporting period from 03/27/2019    Authorization Time Period  CCME auth 12 PT visits 04/16/19 - 05/27/19    Authorization - Visit Number  9    Authorization - Number of Visits  12    Progress Note Due on Visit  10   FOTO due 05/23/2019   PT Start Time  1346    PT Stop Time  1426    PT Time Calculation (min)  40 min    Activity Tolerance  Patient tolerated treatment well    Behavior During Therapy  [facility] for tasks assessed/performed       Past Medical History:  Diagnosis Date  . Anemia   . Arthritis   . Asthma   . COPD (chronic obstructive pulmonary disease) ([place])   . DDD (degenerative disc disease), lumbar   . Diabetes mellitus without complication ([place])    on metformin  . DJD (degenerative joint disease)   . Dysrhythmia   . Fatty liver   . Fibroids   . GERD (gastroesophageal reflux disease)   . Glaucoma   . Hyperlipidemia   . Hypertension   . IBS (irritable bowel syndrome)   . Lumbago   . Neuromuscular disorder ([place])   . 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[place]) 09/6281  . Renal disorder   . Ulcerative colitis ([place])    on mesalamine    Past Surgical History:  Procedure Laterality Date  . COLONOSCOPY WITH PROPOFOL N/A 08/19/2015   Procedure: COLONOSCOPY WITH PROPOFOL;  Surgeon: Lollie Sails, MD;  Location: [facility] ENDOSCOPY;  Service: Endoscopy;  Laterality: N/A;  . COLONOSCOPY WITH PROPOFOL N/A 12/08/2015   Procedure:  COLONOSCOPY WITH PROPOFOL;  Surgeon: Lollie Sails, MD;  Location: [facility] ENDOSCOPY;  Service: Endoscopy;  Laterality: N/A;  . COLONOSCOPY WITH PROPOFOL N/A 12/09/2015   Procedure: COLONOSCOPY WITH PROPOFOL;  Surgeon: Lollie Sails, MD;  Location: [facility] ENDOSCOPY;  Service: Endoscopy;  Laterality: N/A;  . ESOPHAGOGASTRODUODENOSCOPY (EGD) WITH PROPOFOL N/A 11/08/2017   Procedure: ESOPHAGOGASTRODUODENOSCOPY (EGD) WITH PROPOFOL;  Surgeon: Lollie Sails, MD;  Location: [facility] ENDOSCOPY;  Service: Endoscopy;  Laterality: N/A;  . JOINT REPLACEMENT Left 04/19/2006  . JOINT REPLACEMENT Right 01/14/2015  . ORIF FEMUR FRACTURE Left 02/05/2019  . ORIF FEMUR FRACTURE Left 02/05/2019   Procedure: OPEN REDUCTION INTERNAL FIXATION (ORIF) DISTAL FEMUR FRACTURE;  Surgeon: Altamese , MD;  Location: [place];  Service: Orthopedics;  Laterality: Left;  . TOTAL KNEE ARTHROPLASTY Left 2008  . TOTAL KNEE ARTHROPLASTY Right 01/14/2015   Procedure: TOTAL KNEE ARTHROPLASTY;  Surgeon: Hessie Knows, MD;  Location: [facility] ORS;  Service: Orthopedics;  Laterality: Right;    There were no vitals filed for this visit.  Subjective Assessment - 05/23/19 1355    Subjective  Patient states she is feeling well. [name] unsure if she wants to continue physical therapy. She states she had the trendelenburg gait prior to her fall and fracture but she  would like to improve her function and gait to be able to ambulate without assistive device.    Pertinent History  Patient is a 59 y.o. female who presents to outpatient physical therapy with a referral for medical diagnosis left femur ORIF. This patient's chief complaints consist of left knee pain, stiffness, imbalance, weakness, decreased function and ambulatory ability 2/2 to distal femur fracture from fall with ORIF performed 02/05/2019 leading to the following functional deficits: difficulty with all activities that require weight bearing and ambulation including ADLs, IADLs, household  and community ambulation, grocery shopping, cooking, stairs, squatting, cleaning, lifting, bending, etc. 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 Denies history of MI, cancer, seizures, stroke.         [facility] PT Assessment - 05/23/19 0001      Assessment   Medical Diagnosis  left femur ORIF    Referring Provider (PT)  Altamese [place], MD    Onset Date/Surgical Date  02/05/19    Hand Dominance  Right    Next MD Visit  04/18/2019   wants her to walk in   Prior Therapy  has had previous PT for bilateral TKE but not for this condition prior to current episode of care      Precautions   Precautions  None      Restrictions   Weight Bearing Restrictions  Yes    LLE Weight Bearing  Weight bearing as tolerated      [place] residence    Living Arrangements  Alone    Available Help at Discharge  Family   [place]ugher until 4/16   Type of [place] entrance   rails on both sides can reach both   [place]  One level    Home Equipment  Wheelchair - manual;Walker - 2 wheels;Crutches;Grab bars - toilet;Toilet riser;Shower seat   handicap toilet; tub shower combo     Prior Function   Level of Independence  Independent    Vocation  On disability   used to [employer]   Leisure  watch TV, listen to music and gossip,      Cognition   Overall Cognitive Status  Within Functional Limits for tasks assessed   req. help from daughter to complete FOTO, slightly forgetful     Observation/Other Assessments   Focus on Therapeutic Outcomes (FOTO)   FOTO = 70       PT Education - 05/23/19 1420    Education Details  Exercise purpose/form. Self management techniques. POC    Person(s) Educated  Patient    Methods   Explanation;Demonstration;Tactile cues;Verbal cues    Comprehension  Verbalized understanding;Returned demonstration;Verbal cues required;Tactile cues required;Need further instruction       PT Short Term Goals - 04/05/19 1335      PT SHORT TERM GOAL #1   Title  Be independent with initial home exercise program for self-management of symptoms.    Baseline  Initial HEP provided at IE (03/27/2019);    Time  3    Period  Weeks    Status  Achieved    Target Date  04/17/19        PT Long Term Goals - 05/23/19 1429      PT LONG TERM  GOAL #1   Title  Be independent with a long-term home exercise program for self-management of symptoms.   ALL LONG TERM GOALS TARGET DATE 06/19/2019; updated to 07/18/2019   Baseline  Initial HEP provided at IE (03/27/2019); currently participating (04/05/2019; 05/09/2019; 05/23/2019);    Time  8    Period  Weeks    Status  Partially Met      PT LONG TERM GOAL #2   Title  Demonstrate improved FOTO score to equal or greater than 70 to demonstrate improvement in overall condition and self-reported functional ability.    Baseline  58 (03/27/2019); 53 (04/05/2019); 63 (05/09/2019); FOTO = 70 (05/21/2019);    Time  12    Period  Weeks    Status  Achieved      PT LONG TERM GOAL #3   Title  Patient will complete 5TSTS in equal or less than 12 seconds with minimal to no shift towards R LE and no UE support to demonstrate improved functional LE strength and power for transfers and daily mobility.    Baseline  16 seconds with B UE push off from 18.5 inch plinth, sisgnificant shift to right (03/27/2019); 18 seconds with no UE push off from 18 inch chair (04/05/2019); 14 seconds with no UE support from 18 inch chair (05/09/2019); 15 seconds with no UE support from 18 inch chair (05/23/2019);    Time  8    Period  Weeks    Status  Partially Met      PT LONG TERM GOAL #4   Title  Patient will ambulate at least 1000 feet during 6 Minute Walk Test in order to demonstrate ability to  complete activities such as shopping and [place] ambulation.    Baseline  able to ambulate 45 feet with CGA and no AD (03/27/2019); 611 feet with SPC and SBA for safety. Demo antalgic gait favoring L LE with limited weight shift to left. (04/05/2019); 1031 feet with no AD but antalgic gait favoring L LE (05/23/2019);    Time  12    Period  Weeks    Status  Achieved      PT LONG TERM GOAL #5   Title  Complete community, work and/or recreational activities without limitation due to current condition.    Baseline  Difficulty with all activities that require weight bearing and ambulation including ADLs, IADLs, household and community ambulation, grocery shopping, cooking, stairs, squatting, cleaning, lifting, bending, etc (03/27/2019); improved ability to don socks (04/05/2019); improved but still has some difficulty with stairs, bending, and lifting (05/09/2019); improved but has difficulty with stairs, bending, lifting, uneven ground (05/23/2019);    Time  8    Period  Weeks    Status  Partially Met       OBJECTIVE  FOTO = 70 (05/23/2019);   OBSERVATION/INSPECTION  Posture: forward head, rounded shoulders, slumped in sitting.Obese.  Tremor: none  Muscle bulk: generally overall mildly decreased consistent with lack of regular adequate exercise or physical activity.  Bed mobility: supine <> sit and rolling I  Transfers: sit <> stand I with mild discomfort  Gait:ambulates 1031 feet with no AD and supervision for safety (although no loss of balance). Demonstrates left trendelenburg gait.   Stairs: step over step ascending with BUE support, antalgic towards L side.    PERIPHERAL JOINT MOTION (in degrees) *Indicates pain, R/L 05/09/2019 05/23/19   Joint/Motion AROM PROM Comments  Knee     Flexion 120/105 125/110   Extension / 0/7  Comments:B hips and ankles WFL for basic mobility except some decreased DF on L ankle and slight tightness inRhip extension.   MUSCLE  PERFORMANCE (MMT):  *Indicates pain 03/27/2019 05/09/19 05/23/19  Joint/Motion R/L R/L R/L  Hip     Flexion (knee flex) 4+/4 4+/4+ /  Extension (knee flex) 3+/4- 4+/2 3/2  Extension(knee ext) 4/3 5/3 3/3  Abduction 4/4 3+/5 5/4+  Knee     Flexion 4+/4 4+/4 5/4+  Extension 5/4 5/4 5/4+  Ankle/Foot     Dorsiflexion 5/4+ 5/4+ /  Plantarflexion 4/4 / /  [place] 4/4- 4+/4+ /  Great toe extension 4/4- 4/4- /    PALPATION:  TTP at left lateral knee  FUNCTIONAL/BALANCE TESTS:  5TSTS: 15 seconds with no UE push offfrom 18 inch chair  Six Minute Walk Test: 1031 with no AD (SBA for safety but no stumbles). Demo compensated trendelenburg gait suggesting L glute med weakness, mild pain in the left knee.   EDUCATION/COGNITION: Patient is alert and oriented X 4.able to complete FOTO independently today  Objective measurements completed on examination: See above findings.    TREATMENT:  Therapeutic exercise:to centralize symptoms and improve ROM, strength, muscular endurance, and activity tolerance required for successful completion of functional activities. - measurements to assess progress including 6MWT, 5TSTS x2, MMT, ROM (see above). - bent over hip extension (leaning on elevated plinth), 2x10 each side, 4secH  - Education on diagnosis, prognosis, POC, anatomy and physiology of current condition.     Pt required multimodal cuing for proper technique and to facilitate improved neuromuscular control, strength, range of motion, and functional ability resulting in improved performance and form.       Plan - 05/23/19 1428    Clinical Impression Statement  Patient has attended 13 physical therapy treatment sessions and continues to make steady progress towards goals. She has improved FOTO score, 5TSTS test, 6MWT, ability to complete stairs, ambulates without AD in clinic, and improved MMT. She has not made any significant progress on left knee ROM and end range  flexion has firm end feel, suggesting hard block to progress there. Patient continues to have an altered gait pattern and still has difficulty with stairs, lifting, and bending. She has not yet returned to PLOF. Patient initially felt she was satisfied in her improvement and was planning to discharge today, but upon further consideration, she reports that she would like to continu improving her hip strength and gait pattern to improve her confidence and ability to complete ambulation and functional activities. Patient presents with significant balance, muscle performance (strength/power/endurance), activity tolerance, joint stiffness, ROM, muscle length, gait impairments that are limiting ability to complete all activities that require weight bearing and ambulation including ADLs, IADLs, household and community ambulation, grocery shopping, cooking, stairs, squatting, cleaning, lifting, bending, etc.  without difficulty. Condition decreases patient's quality of life and limits her social/community participation. Patient would benefit from continued management of limiting condition by skilled physical therapist to address remaining impairments and functional limitations to work towards stated goals and return to PLOF or maximal functional independence    Personal Factors and Comorbidities  Age;Comorbidity 3+;Other;Past/Current Experience;Social Background;Time since onset of injury/illness/exacerbation;Transportation;Fitness    Comorbidities  Relevant past medical history and comorbidities include HTN, COPD, DDD, diabetes mellitus II, DJD, dysrhythmia, fatty liver, GERD, arthritis, recovering alcoholic (sober for last 4 years), renal disorder, ulcerative colitis, 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    Examination-Activity Limitations  Bathing;Bed Mobility;Bend;Locomotion Level;Lift;Stairs;Squat;Hygiene/Grooming;Stand;Toileting;Carry;Transfers;Dressing    Examination-Participation Restrictions  Cleaning;Shop;Yard Work;Meal Prep;Community Activity;Interpersonal Relationship    Stability/Clinical Decision Making  Evolving/Moderate complexity    Rehab Potential  Good    PT Frequency  2x / week    PT Duration  8 weeks    PT Treatment/Interventions  ADLs/Self Care Home Management;Cryotherapy;Electrical Stimulation;Moist Heat;Gait training;Stair training;Functional mobility training;Therapeutic activities;Therapeutic exercise;Balance training;Neuromuscular re-education;Patient/family education;Manual techniques;Scar mobilization;Passive range of motion;Dry needling;Spinal Manipulations;Joint Manipulations    PT Next Visit Plan  progressive LE strengthening, ROM, balance training as tolerated    PT Home Exercise Plan  Medbridge Access Code: [email or web address]    Consulted and Agree with Plan of Care  Patient       Patient will benefit from skilled therapeutic intervention in order to improve the following deficits and impairments:  Abnormal gait, Decreased knowledge of use of DME, Increased fascial restricitons, Pain, Improper body mechanics, Hypermobility, Decreased mobility, Decreased scar mobility, Decreased activity tolerance, Decreased endurance, Decreased range of motion, Decreased strength, Hypomobility, Impaired perceived functional ability, Obesity, Impaired flexibility, Difficulty walking, Decreased balance  Visit Diagnosis: Difficulty in walking, not elsewhere classified - Plan: PT plan of care cert/re-cert  History of falling - Plan: PT plan of care cert/re-cert  Left knee pain, unspecified chronicity - Plan: PT plan of care cert/re-cert  Muscle weakness (generalized) - Plan: PT plan of care cert/re-cert     Problem List Patient Active Problem List   Diagnosis Date Noted  . Closed displaced supracondylar fracture of distal end of left femur without intracondylar extension ([place]) 02/04/2019  . Femur fracture ([place]) 02/04/2019  . Leukocytosis 02/04/2019  . Alcohol use 02/04/2019  .  Right leg weakness 02/14/2018  . Elevated bilirubin 02/14/2018  . SIRS (systemic inflammatory response syndrome) ([place]) 01/30/2018  . Hypoglycemia 01/30/2018  . Lactic acidosis 01/30/2018  . Alcohol abuse 01/30/2018  . Abdominal pain, chronic, epigastric   . Pancreatitis 08/29/2017  . Diabetes ([place]) 08/29/2017  . HTN (hypertension) 08/29/2017  . HLD (hyperlipidemia) 08/29/2017  . GERD (gastroesophageal reflux disease) 08/29/2017  . Hypokalemia 08/29/2017  . Hypocalcemia 08/29/2017  . Primary osteoarthritis of right knee 01/14/2015    Everlean Alstrom. Graylon Good, PT, DPT 05/23/19, 2:35 PM  [facility] [address] [place], [address] Phone: [phone]   Fax:  [phone]  Name: Sheila [name] MRN: [id number] Date of Birth: 1961/01/02

## 2019-05-24 ENCOUNTER — Other Ambulatory Visit: Payer: Self-pay

## 2019-05-24 ENCOUNTER — Ambulatory Visit
Admission: RE | Admit: 2019-05-24 | Discharge: 2019-05-24 | Disposition: A | Discharge status: Home / Self Care | Payer: Medicaid Other | Source: Ambulatory Visit | Attending: Gastroenterology | Admitting: Gastroenterology

## 2019-05-24 DIAGNOSIS — R7989 Other specified abnormal findings of blood chemistry: Secondary | ICD-10-CM | POA: Insufficient documentation

## 2019-05-24 DIAGNOSIS — R933 Abnormal findings on diagnostic imaging of other parts of digestive tract: Secondary | ICD-10-CM | POA: Diagnosis present

## 2019-05-24 MED ORDER — GADOBUTROL 1 MMOL/ML IV SOLN
7.5000 mL | Freq: Once | INTRAVENOUS | Status: AC | PRN
  Administered 2019-05-24: 7.5 mL via INTRAVENOUS

## 2019-05-31 ENCOUNTER — Other Ambulatory Visit: Payer: Self-pay

## 2019-05-31 ENCOUNTER — Encounter: Payer: Self-pay | Admitting: Physical Therapy

## 2019-05-31 ENCOUNTER — Ambulatory Visit: Payer: Medicaid Other | Admitting: Physical Therapy

## 2019-05-31 DIAGNOSIS — M6281 Muscle weakness (generalized): Secondary | ICD-10-CM

## 2019-05-31 DIAGNOSIS — Z9181 History of falling: Secondary | ICD-10-CM

## 2019-05-31 DIAGNOSIS — R262 Difficulty in walking, not elsewhere classified: Secondary | ICD-10-CM | POA: Diagnosis not present

## 2019-05-31 DIAGNOSIS — M25562 Pain in left knee: Secondary | ICD-10-CM

## 2019-05-31 NOTE — Therapy (Signed)
[facility] [address], [place], [address] Phone: [phone]   Fax:  [phone]  Physical Therapy Treatment  Patient Details  Name: Sheila [name] MRN: [id number] Date of Birth: [birth year] Referring Provider (PT): Altamese Biddle, MD   Encounter Date: 05/31/2019  PT End of Session - 05/31/19 0953    Visit Number  14    Number of Visits  27    Date for PT Re-Evaluation  07/18/19    Authorization Type  Medicaid reporting period from 03/27/2019    Authorization Time Period  CCME auth 12 PT visits 05/31/19 - 07/11/19    Authorization - Visit Number  1    Authorization - Number of Visits  12    Progress Note Due on Visit  10   FOTO due 05/23/2019   PT Start Time  0945    PT Stop Time  1025    PT Time Calculation (min)  40 min    Activity Tolerance  Patient tolerated treatment well    Behavior During Therapy  [facility] for tasks assessed/performed       Past Medical History:  Diagnosis Date  . Anemia   . Arthritis   . Asthma   . COPD (chronic obstructive pulmonary disease) ([place])   . DDD (degenerative disc disease), lumbar   . Diabetes mellitus without complication ([place])    on metformin  . DJD (degenerative joint disease)   . Dysrhythmia   . Fatty liver   . Fibroids   . GERD (gastroesophageal reflux disease)   . Glaucoma   . Hyperlipidemia   . Hypertension   . IBS (irritable bowel syndrome)   . Lumbago   . Neuromuscular disorder ([place])   . 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[place]) 23/5573  . Renal disorder   . Ulcerative colitis ([place])    on mesalamine    Past Surgical History:  Procedure Laterality Date  . COLONOSCOPY WITH PROPOFOL N/A 08/19/2015   Procedure: COLONOSCOPY WITH PROPOFOL;  Surgeon: Lollie Sails, MD;  Location: [facility] ENDOSCOPY;  Service: Endoscopy;  Laterality: N/A;  . COLONOSCOPY WITH PROPOFOL N/A 12/08/2015   Procedure: COLONOSCOPY WITH PROPOFOL;  Surgeon: Lollie Sails, MD;  Location: [facility]  ENDOSCOPY;  Service: Endoscopy;  Laterality: N/A;  . COLONOSCOPY WITH PROPOFOL N/A 12/09/2015   Procedure: COLONOSCOPY WITH PROPOFOL;  Surgeon: Lollie Sails, MD;  Location: [facility] ENDOSCOPY;  Service: Endoscopy;  Laterality: N/A;  . ESOPHAGOGASTRODUODENOSCOPY (EGD) WITH PROPOFOL N/A 11/08/2017   Procedure: ESOPHAGOGASTRODUODENOSCOPY (EGD) WITH PROPOFOL;  Surgeon: Lollie Sails, MD;  Location: [facility] ENDOSCOPY;  Service: Endoscopy;  Laterality: N/A;  . JOINT REPLACEMENT Left 04/19/2006  . JOINT REPLACEMENT Right 01/14/2015  . ORIF FEMUR FRACTURE Left 02/05/2019  . ORIF FEMUR FRACTURE Left 02/05/2019   Procedure: OPEN REDUCTION INTERNAL FIXATION (ORIF) DISTAL FEMUR FRACTURE;  Surgeon: Altamese [place], MD;  Location: [facility];  Service: Orthopedics;  Laterality: Left;  . TOTAL KNEE ARTHROPLASTY Left 2008  . TOTAL KNEE ARTHROPLASTY Right 01/14/2015   Procedure: TOTAL KNEE ARTHROPLASTY;  Surgeon: Hessie Knows, MD;  Location: [facility] ORS;  Service: Orthopedics;  Laterality: Right;    There were no vitals filed for this visit.  Subjective Assessment - 05/31/19 0950    Subjective  Patient report she is feeling a little "swimmy headed" since she got her first [place] 19 shot on Tuesday. Her arm was pretty sore but then it started to get better. No pain this morning. No falls since last session.  Pertinent History  Patient is a 59 y.o. female who presents to outpatient physical therapy with a referral for medical diagnosis left femur ORIF. This patient's chief complaints consist of left knee pain, stiffness, imbalance, weakness, decreased function and ambulatory ability 2/2 to distal femur fracture from fall with ORIF performed 02/05/2019 leading to the following functional deficits: difficulty with all activities that require weight bearing and ambulation including ADLs, IADLs, household and community ambulation, grocery shopping, cooking, stairs, squatting, cleaning, lifting, bending, etc. Relevant past medical  history and comorbidities include HTN, COPD, DDD, diabetes mellitus II, DJD, dysrhythmia, fatty liver, GERD, arthritis, recovering alcoholic (sober for last 4 years), renal disorder, ulcerative colitis, 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 Denies history of MI, cancer, seizures, stroke.    Currently in Pain?  No/denies       OBJECTIVE FOTO = 70 (05/21/2019)   TREATMENT:  Therapeutic exercise:to centralize symptoms and improve ROM, strength, muscular endurance, and activity tolerance required for successful completion of functional activities.(supervision - CGA for safety). -recumbent bike pedaling no resistance on seat setting as close as possible to stretch L knee flexion (seat setting 8 x 5 min, setting 5 x 1 min, setting 3 x 2 min, setting 2 x 1 min, setting 1 x1 min). To improve ROM for L knee flexion. - bent over hip extension (leaning on elevated plinth), 3x15 each side, 2# AW, 1secH -Runner's step up (contralateral knee high). 1x15each side at 6 inch step. 1x15 at 8 inch step. B UEsupport as neededin hallway on wall at side. SBA-CGAfor safety.  - side stepping with yellow theraband around ankles,1x21 feet each direction. CGA-minA for safetyand to maintain form.  - alternating mini lunge to ball side of BOSU x 10 each side unilateral UE support.  - leg press (single leg) L 1x10 at 20#, 1x10 at 25#. R 2x10 at 25#   PT Education - 05/31/19 0957    Education Details  Exercise purpose/form. Self management techniques.    Person(s) Educated  Patient    Methods  Explanation;Demonstration;Tactile cues;Verbal cues    Comprehension  Verbalized understanding;Returned demonstration;Verbal cues required;Tactile cues required;Need further instruction       PT Short Term Goals - 04/05/19 1335      PT SHORT TERM GOAL #1   Title  Be independent with initial home exercise program for self-management of symptoms.    Baseline  Initial HEP provided at IE (03/27/2019);    Time  3     Period  Weeks    Status  Achieved    Target Date  04/17/19        PT Long Term Goals - 05/23/19 1429      PT LONG TERM GOAL #1   Title  Be independent with a long-term home exercise program for self-management of symptoms.   ALL LONG TERM GOALS TARGET DATE 06/19/2019; updated to 07/18/2019   Baseline  Initial HEP provided at IE (03/27/2019); currently participating (04/05/2019; 05/09/2019; 05/23/2019);    Time  8    Period  Weeks    Status  Partially Met      PT LONG TERM GOAL #2   Title  Demonstrate improved FOTO score to equal or greater than 70 to demonstrate improvement in overall condition and self-reported functional ability.    Baseline  58 (03/27/2019); 53 (04/05/2019); 63 (05/09/2019); FOTO = 70 (05/21/2019);    Time  12    Period  Weeks    Status  Achieved  PT LONG TERM GOAL #3   Title  Patient will complete 5TSTS in equal or less than 12 seconds with minimal to no shift towards R LE and no UE support to demonstrate improved functional LE strength and power for transfers and daily mobility.    Baseline  16 seconds with B UE push off from 18.5 inch plinth, sisgnificant shift to right (03/27/2019); 18 seconds with no UE push off from 18 inch chair (04/05/2019); 14 seconds with no UE support from 18 inch chair (05/09/2019); 15 seconds with no UE support from 18 inch chair (05/23/2019);    Time  8    Period  Weeks    Status  Partially Met      PT LONG TERM GOAL #4   Title  Patient will ambulate at least 1000 feet during 6 Minute Walk Test in order to demonstrate ability to complete activities such as shopping and [place] ambulation.    Baseline  able to ambulate 45 feet with CGA and no AD (03/27/2019); 611 feet with SPC and SBA for safety. Demo antalgic gait favoring L LE with limited weight shift to left. (04/05/2019); 1031 feet with no AD but antalgic gait favoring L LE (05/23/2019);    Time  12    Period  Weeks    Status  Achieved      PT LONG TERM GOAL #5   Title  Complete  community, work and/or recreational activities without limitation due to current condition.    Baseline  Difficulty with all activities that require weight bearing and ambulation including ADLs, IADLs, household and community ambulation, grocery shopping, cooking, stairs, squatting, cleaning, lifting, bending, etc (03/27/2019); improved ability to don socks (04/05/2019); improved but still has some difficulty with stairs, bending, and lifting (05/09/2019); improved but has difficulty with stairs, bending, lifting, uneven ground (05/23/2019);    Time  8    Period  Weeks    Status  Partially Met            Plan - 05/31/19 1044    Clinical Impression Statement  Patient tolerated treatment well overall with no reports of pain except with maximum flexion stretch at bicycle that resolved when removed from position. Had strong muscular fatigue with 8 inch step up on the left side. Continues to show hip and quad weakness. Patient would benefit from continued management of limiting condition by skilled physical therapist to address remaining impairments and functional limitations to work towards stated goals and return to PLOF or maximal functional independence.    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    Examination-Activity Limitations  Bathing;Bed Mobility;Bend;Locomotion Level;Lift;Stairs;Squat;Hygiene/Grooming;Stand;Toileting;Carry;Transfers;Dressing    Examination-Participation Restrictions  Cleaning;Shop;Yard Work;Meal Prep;Community Activity;Interpersonal Relationship    Stability/Clinical  Decision Making  Evolving/Moderate complexity    Rehab Potential  Good    PT Frequency  2x / week    PT Duration  8 weeks    PT Treatment/Interventions  ADLs/Self Care Home Management;Cryotherapy;Electrical Stimulation;Moist Heat;Gait training;Stair training;Functional mobility training;Therapeutic activities;Therapeutic exercise;Balance training;Neuromuscular re-education;Patient/family education;Manual techniques;Scar mobilization;Passive range of motion;Dry needling;Spinal Manipulations;Joint Manipulations    PT Next Visit Plan  progressive LE strengthening, ROM, balance training as tolerated    PT [place]  Access Code: [email or web address]    Consulted and Agree with Plan of Care  Patient       Patient will benefit from skilled therapeutic intervention in order to improve the following deficits and impairments:  Abnormal gait, Decreased knowledge of use of DME, Increased fascial restricitons, Pain, Improper body mechanics, Hypermobility, Decreased mobility, Decreased scar mobility, Decreased activity tolerance, Decreased endurance, Decreased range of motion, Decreased strength, Hypomobility, Impaired perceived functional ability, Obesity, Impaired flexibility, Difficulty walking, Decreased balance  Visit Diagnosis: Difficulty in walking, not elsewhere classified  History of falling  Left knee pain, unspecified chronicity  Muscle weakness (generalized)     Problem List Patient Active Problem List   Diagnosis Date Noted  . Closed displaced supracondylar fracture of distal end of left femur without intracondylar extension ([place]) 02/04/2019  . Femur fracture ([place]) 02/04/2019  . Leukocytosis 02/04/2019  . Alcohol use 02/04/2019  . Right leg weakness 02/14/2018  . Elevated bilirubin 02/14/2018  . SIRS (systemic inflammatory response syndrome) ([place]) 01/30/2018  . Hypoglycemia 01/30/2018  . Lactic acidosis 01/30/2018  . Alcohol abuse 01/30/2018  . Abdominal pain, chronic,  epigastric   . Pancreatitis 08/29/2017  . Diabetes ([place]) 08/29/2017  . HTN (hypertension) 08/29/2017  . HLD (hyperlipidemia) 08/29/2017  . GERD (gastroesophageal reflux disease) 08/29/2017  . Hypokalemia 08/29/2017  . Hypocalcemia 08/29/2017  . Primary osteoarthritis of right knee 01/14/2015    Everlean Alstrom. Graylon Good, PT, DPT 05/31/19, 10:44 AM  [facility] [address] [place], [address] Phone: [phone]   Fax:  [phone]  Name: Sheila [name] MRN: [id number] Date of Birth: 07/11/1960

## 2019-06-05 ENCOUNTER — Encounter: Payer: Medicaid Other | Admitting: Physical Therapy

## 2019-06-06 ENCOUNTER — Ambulatory Visit: Payer: Medicaid Other | Attending: Orthopedic Surgery | Admitting: Physical Therapy

## 2019-06-06 ENCOUNTER — Other Ambulatory Visit: Payer: Self-pay

## 2019-06-06 ENCOUNTER — Encounter: Payer: Self-pay | Admitting: Physical Therapy

## 2019-06-06 DIAGNOSIS — R262 Difficulty in walking, not elsewhere classified: Secondary | ICD-10-CM | POA: Insufficient documentation

## 2019-06-06 DIAGNOSIS — M25562 Pain in left knee: Secondary | ICD-10-CM | POA: Diagnosis present

## 2019-06-06 DIAGNOSIS — M6281 Muscle weakness (generalized): Secondary | ICD-10-CM | POA: Insufficient documentation

## 2019-06-06 DIAGNOSIS — Z9181 History of falling: Secondary | ICD-10-CM | POA: Insufficient documentation

## 2019-06-06 NOTE — Therapy (Signed)
[facility] [address] [place], [address] Phone: [phone]   Fax:  [phone]  Physical Therapy Treatment  Patient Details  Name: Sheila [name] MRN: [id number] Date of Birth: 1960-03-27 Referring Provider (PT): Altamese [place]s, MD   Encounter Date: 06/06/2019  PT End of Session - 06/06/19 1853    Visit Number  15    Number of Visits  27    Date for PT Re-Evaluation  07/18/19    Authorization Type  Medicaid reporting period from 03/27/2019    Authorization Time Period  CCME auth 12 PT visits 05/31/19 - 07/11/19    Authorization - Visit Number  1    Authorization - Number of Visits  12    Progress Note Due on Visit  10    PT Start Time  4944    PT Stop Time  1535    PT Time Calculation (min)  40 min    Equipment Utilized During Treatment  Gait belt    Activity Tolerance  Patient tolerated treatment well;No increased pain    Behavior During Therapy  WFL for tasks assessed/performed       Past Medical History:  Diagnosis Date  . Anemia   . Arthritis   . Asthma   . COPD (chronic obstructive pulmonary disease) ([place])   . DDD (degenerative disc disease), lumbar   . Diabetes mellitus without complication ([place])    on metformin  . DJD (degenerative joint disease)   . Dysrhythmia   . Fatty liver   . Fibroids   . GERD (gastroesophageal reflux disease)   . Glaucoma   . Hyperlipidemia   . Hypertension   . IBS (irritable bowel syndrome)   . Lumbago   . Neuromuscular disorder ([place])   . 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[place]) 96/7591  . Renal disorder   . Ulcerative colitis ([place])    on mesalamine    Past Surgical History:  Procedure Laterality Date  . COLONOSCOPY WITH PROPOFOL N/A 08/19/2015   Procedure: COLONOSCOPY WITH PROPOFOL;  Surgeon: Lollie Sails, MD;  Location: [facility] ENDOSCOPY;  Service: Endoscopy;  Laterality: N/A;  . COLONOSCOPY WITH PROPOFOL N/A 12/08/2015   Procedure: COLONOSCOPY WITH PROPOFOL;   Surgeon: Lollie Sails, MD;  Location: [facility] ENDOSCOPY;  Service: Endoscopy;  Laterality: N/A;  . COLONOSCOPY WITH PROPOFOL N/A 12/09/2015   Procedure: COLONOSCOPY WITH PROPOFOL;  Surgeon: Lollie Sails, MD;  Location: [facility] ENDOSCOPY;  Service: Endoscopy;  Laterality: N/A;  . ESOPHAGOGASTRODUODENOSCOPY (EGD) WITH PROPOFOL N/A 11/08/2017   Procedure: ESOPHAGOGASTRODUODENOSCOPY (EGD) WITH PROPOFOL;  Surgeon: Lollie Sails, MD;  Location: [facility] ENDOSCOPY;  Service: Endoscopy;  Laterality: N/A;  . JOINT REPLACEMENT Left 04/19/2006  . JOINT REPLACEMENT Right 01/14/2015  . ORIF FEMUR FRACTURE Left 02/05/2019  . ORIF FEMUR FRACTURE Left 02/05/2019   Procedure: OPEN REDUCTION INTERNAL FIXATION (ORIF) DISTAL FEMUR FRACTURE;  Surgeon: Altamese Wiscon, MD;  Location: [place];  Service: Orthopedics;  Laterality: Left;  . TOTAL KNEE ARTHROPLASTY Left 2008  . TOTAL KNEE ARTHROPLASTY Right 01/14/2015   Procedure: TOTAL KNEE ARTHROPLASTY;  Surgeon: Hessie Knows, MD;  Location: [facility] ORS;  Service: Orthopedics;  Laterality: Right;    There were no vitals filed for this visit.  Subjective Assessment - 06/06/19 1500    Subjective  Patient reports she no longer has an symptoms of COVID 19 vaccine side effects. Rated L knee pain 0/10 upon arrival after taking a couple of alieve tablets. Reports it was 8/10 prior  ot that in the lateral anterior L knee. Reports no excessive pain or sorenes following last treatmnet session. No falls since last session.    Pertinent History  Patient is a 59 y.o. female who presents to outpatient physical therapy with a referral for medical diagnosis left femur ORIF. This patient's chief complaints consist of left knee pain, stiffness, imbalance, weakness, decreased function and ambulatory ability 2/2 to distal femur fracture from fall with ORIF performed 02/05/2019 leading to the following functional deficits: difficulty with all activities that require weight bearing and ambulation  including ADLs, IADLs, household and community ambulation, grocery shopping, cooking, stairs, squatting, cleaning, lifting, bending, etc. 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 Denies history of MI, cancer, seizures, stroke.    Currently in Pain?  No/denies       OBJECTIVE FOTO = 70 (05/21/2019)  TREATMENT:  Therapeutic exercise:to centralize symptoms and improve ROM, strength, muscular endurance, and activity tolerance required for successful completion of functional activities.(supervision - CGA for safety). - NuStep level 4 using bilateral lower extremities  only. Seat setting 6. For improved extremity mobility, muscular endurance, and activity tolerance; and to induce the analgesic effect of aerobic exercise, stimulate improved joint nutrition, and prepare body structures and systems for following interventions. x 5 minutes during subjective exam. Instructed to keep SPM above 70. Average SPM = 74. - bent over hip extension (leaning on elevated plinth), 1x10 each side with yellow theraband around ankles, 1x10 each side with no resistance, 2x15each side, 2# AW, 4 countH. Updated HEP with this exercise with no resistance.  -Runner's step up (contralateral knee high). 1x15each side at 6 inch step. 2x15 at 8 inch step. B UEsupport as neededin hallway on wall at side. CGAfor safety.  - alternating mini lunge to ball side of BOSU x 20 each side unilateral UE support progressing to no support on left side.   - leg press (single leg)   L x10, 1x4 at 25#, 1x6 at 29#,   R 1x10 at 25#, 2x10 at 29#.     HOME EXERCISE PROGRAM Access Code: [email or web address] URL: [email or web address] Date: 04/25/2019 Prepared by: Rosita Kea  Exercises Supine Heel Slide with  Strap - 2 x daily - 20 reps - 5 seconds hold Sit to Stand - 2 x daily - 3 sets - 10 reps Diagonal Hip Extension with Resistance - 2 x daily - 3 sets - 15 reps - 4 count hold   PT Education - 06/06/19 1502    Education Details  Exercise purpose/form. Self management techniques.    Person(s) Educated  Patient    Methods  Explanation;Demonstration;Tactile cues;Verbal cues    Comprehension  Verbalized understanding;Returned demonstration;Verbal cues required;Tactile cues required;Need further instruction       PT Short Term Goals - 04/05/19 1335      PT SHORT TERM GOAL #1   Title  Be independent with initial home exercise program for self-management of symptoms.    Baseline  Initial HEP provided at IE (03/27/2019);    Time  3    Period  Weeks    Status  Achieved    Target Date  04/17/19        PT Long Term Goals - 05/23/19 1429      PT LONG TERM GOAL #1   Title  Be independent with  a long-term home exercise program for self-management of symptoms.   ALL LONG TERM GOALS TARGET DATE 06/19/2019; updated to 07/18/2019   Baseline  Initial HEP provided at IE (03/27/2019); currently participating (04/05/2019; 05/09/2019; 05/23/2019);    Time  8    Period  Weeks    Status  Partially Met      PT LONG TERM GOAL #2   Title  Demonstrate improved FOTO score to equal or greater than 70 to demonstrate improvement in overall condition and self-reported functional ability.    Baseline  58 (03/27/2019); 53 (04/05/2019); 63 (05/09/2019); FOTO = 70 (05/21/2019);    Time  12    Period  Weeks    Status  Achieved      PT LONG TERM GOAL #3   Title  Patient will complete 5TSTS in equal or less than 12 seconds with minimal to no shift towards R LE and no UE support to demonstrate improved functional LE strength and power for transfers and daily mobility.    Baseline  16 seconds with B UE push off from 18.5 inch plinth, sisgnificant shift to right (03/27/2019); 18 seconds with no UE push off from 18 inch chair  (04/05/2019); 14 seconds with no UE support from 18 inch chair (05/09/2019); 15 seconds with no UE support from 18 inch chair (05/23/2019);    Time  8    Period  Weeks    Status  Partially Met      PT LONG TERM GOAL #4   Title  Patient will ambulate at least 1000 feet during 6 Minute Walk Test in order to demonstrate ability to complete activities such as shopping and [place] ambulation.    Baseline  able to ambulate 45 feet with CGA and no AD (03/27/2019); 611 feet with SPC and SBA for safety. Demo antalgic gait favoring L LE with limited weight shift to left. (04/05/2019); 1031 feet with no AD but antalgic gait favoring L LE (05/23/2019);    Time  12    Period  Weeks    Status  Achieved      PT LONG TERM GOAL #5   Title  Complete community, work and/or recreational activities without limitation due to current condition.    Baseline  Difficulty with all activities that require weight bearing and ambulation including ADLs, IADLs, household and community ambulation, grocery shopping, cooking, stairs, squatting, cleaning, lifting, bending, etc (03/27/2019); improved ability to don socks (04/05/2019); improved but still has some difficulty with stairs, bending, and lifting (05/09/2019); improved but has difficulty with stairs, bending, lifting, uneven ground (05/23/2019);    Time  8    Period  Weeks    Status  Partially Met            Plan - 06/06/19 1856    Clinical Impression Statement  Patient tolerated treatment well overall but was did require modulation of exercise due to L knee discomfort during leg press. Continues to show hip weakness that affects gait, L > R but was able to tolerate higher volume of exercise. Patient would benefit from continued management of limiting condition by skilled physical therapist to address remaining impairments and functional limitations to work towards stated goals and return to PLOF or maximal functional independence.    Personal Factors and Comorbidities   Age;Comorbidity 3+;Other;Past/Current Experience;Social Background;Time since onset of injury/illness/exacerbation;Transportation;Fitness    Comorbidities  Relevant past medical history and comorbidities include HTN, COPD, DDD, diabetes mellitus II, DJD, dysrhythmia, fatty liver, GERD, arthritis, recovering alcoholic (sober  for last 4 years), renal disorder, ulcerative colitis, 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    Examination-Activity Limitations  Bathing;Bed Mobility;Bend;Locomotion Level;Lift;Stairs;Squat;Hygiene/Grooming;Stand;Toileting;Carry;Transfers;Dressing    Examination-Participation Restrictions  Cleaning;Shop;Yard Work;Meal Prep;Community Activity;Interpersonal Relationship    Stability/Clinical Decision Making  Evolving/Moderate complexity    Rehab Potential  Good    PT Frequency  2x / week    PT Duration  8 weeks    PT Treatment/Interventions  ADLs/Self Care Home Management;Cryotherapy;Electrical Stimulation;Moist Heat;Gait training;Stair training;Functional mobility training;Therapeutic activities;Therapeutic exercise;Balance training;Neuromuscular re-education;Patient/family education;Manual techniques;Scar mobilization;Passive range of motion;Dry needling;Spinal Manipulations;Joint Manipulations    PT Next Visit Plan  progressive LE strengthening, ROM, balance training as tolerated    PT Home Exercise Plan  Medbridge Access Code: [email or web address]    Consulted and Agree with Plan of Care  Patient       Patient will benefit from skilled therapeutic intervention in order to improve the following deficits and impairments:  Abnormal gait, Decreased knowledge of use of DME, Increased fascial restricitons, Pain, Improper body mechanics, Hypermobility, Decreased mobility, Decreased scar mobility, Decreased activity tolerance, Decreased endurance, Decreased range of motion, Decreased strength, Hypomobility, Impaired perceived functional ability, Obesity, Impaired flexibility, Difficulty walking,  Decreased balance  Visit Diagnosis: Difficulty in walking, not elsewhere classified  History of falling  Left knee pain, unspecified chronicity  Muscle weakness (generalized)     Problem List Patient Active Problem List   Diagnosis Date Noted  . Closed displaced supracondylar fracture of distal end of left femur without intracondylar extension ([place]) 02/04/2019  . Femur fracture ([place]) 02/04/2019  . Leukocytosis 02/04/2019  . Alcohol use 02/04/2019  . Right leg weakness 02/14/2018  . Elevated bilirubin 02/14/2018  . SIRS (systemic inflammatory response syndrome) ([place]) 01/30/2018  . Hypoglycemia 01/30/2018  . Lactic acidosis 01/30/2018  . Alcohol abuse 01/30/2018  . Abdominal pain, chronic, epigastric   . Pancreatitis 08/29/2017  . Diabetes ([place]) 08/29/2017  . HTN (hypertension) 08/29/2017  . HLD (hyperlipidemia) 08/29/2017  . GERD (gastroesophageal reflux disease) 08/29/2017  . Hypokalemia 08/29/2017  . Hypocalcemia 08/29/2017  . Primary osteoarthritis of right knee 01/14/2015   Everlean Alstrom. Graylon Good, PT, DPT 06/06/19, 6:57 PM  [facility] [address] [place], [address] Phone: [phone]   Fax:  [phone]  Name: Sheila [name] MRN: [id number] Date of Birth: 04/24/1960

## 2019-06-11 ENCOUNTER — Encounter: Payer: Medicaid Other | Admitting: Physical Therapy

## 2019-06-11 ENCOUNTER — Ambulatory Visit: Payer: Medicaid Other | Admitting: Physical Therapy

## 2019-06-11 ENCOUNTER — Encounter: Payer: Self-pay | Admitting: Physical Therapy

## 2019-06-11 ENCOUNTER — Other Ambulatory Visit: Payer: Self-pay

## 2019-06-11 DIAGNOSIS — R262 Difficulty in walking, not elsewhere classified: Secondary | ICD-10-CM | POA: Diagnosis not present

## 2019-06-11 DIAGNOSIS — M6281 Muscle weakness (generalized): Secondary | ICD-10-CM

## 2019-06-11 DIAGNOSIS — Z9181 History of falling: Secondary | ICD-10-CM

## 2019-06-11 DIAGNOSIS — M25562 Pain in left knee: Secondary | ICD-10-CM

## 2019-06-11 NOTE — Therapy (Signed)
[facility] [address], [place], [address] Phone: [phone]   Fax:  [phone]  Physical Therapy Treatment  Patient Details  Name: Sheila [name] MRN: [id number] Date of Birth: Aug 03, 1960 Referring Provider (PT): Altamese Wilder, MD   Encounter Date: 06/11/2019  PT End of Session - 06/11/19 1153    Visit Number  16    Number of Visits  27    Date for PT Re-Evaluation  07/18/19    Authorization Type  Medicaid reporting period from 03/27/2019    Authorization Time Period  CCME auth 12 PT visits 05/31/19 - 07/11/19    Authorization - Visit Number  2    Authorization - Number of Visits  12    Progress Note Due on Visit  10    PT Start Time  1120    PT Stop Time  1200    PT Time Calculation (min)  40 min    Equipment Utilized During Treatment  Gait belt    Activity Tolerance  Patient tolerated treatment well;No increased pain    Behavior During Therapy  WFL for tasks assessed/performed       Past Medical History:  Diagnosis Date  . Anemia   . Arthritis   . Asthma   . COPD (chronic obstructive pulmonary disease) ([place])   . DDD (degenerative disc disease), lumbar   . Diabetes mellitus without complication ([place])    on metformin  . DJD (degenerative joint disease)   . Dysrhythmia   . Fatty liver   . Fibroids   . GERD (gastroesophageal reflux disease)   . Glaucoma   . Hyperlipidemia   . Hypertension   . IBS (irritable bowel syndrome)   . Lumbago   . Neuromuscular disorder ([place])   . 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[place]) 50/3546  . Renal disorder   . Ulcerative colitis ([place])    on mesalamine    Past Surgical History:  Procedure Laterality Date  . COLONOSCOPY WITH PROPOFOL N/A 08/19/2015   Procedure: COLONOSCOPY WITH PROPOFOL;  Surgeon: Lollie Sails, MD;  Location: [facility] ENDOSCOPY;  Service: Endoscopy;  Laterality: N/A;  . COLONOSCOPY WITH PROPOFOL N/A 12/08/2015   Procedure: COLONOSCOPY WITH PROPOFOL;   Surgeon: Lollie Sails, MD;  Location: [facility] ENDOSCOPY;  Service: Endoscopy;  Laterality: N/A;  . COLONOSCOPY WITH PROPOFOL N/A 12/09/2015   Procedure: COLONOSCOPY WITH PROPOFOL;  Surgeon: Lollie Sails, MD;  Location: [facility] ENDOSCOPY;  Service: Endoscopy;  Laterality: N/A;  . ESOPHAGOGASTRODUODENOSCOPY (EGD) WITH PROPOFOL N/A 11/08/2017   Procedure: ESOPHAGOGASTRODUODENOSCOPY (EGD) WITH PROPOFOL;  Surgeon: Lollie Sails, MD;  Location: [facility] ENDOSCOPY;  Service: Endoscopy;  Laterality: N/A;  . JOINT REPLACEMENT Left 04/19/2006  . JOINT REPLACEMENT Right 01/14/2015  . ORIF FEMUR FRACTURE Left 02/05/2019  . ORIF FEMUR FRACTURE Left 02/05/2019   Procedure: OPEN REDUCTION INTERNAL FIXATION (ORIF) DISTAL FEMUR FRACTURE;  Surgeon: Altamese [place], MD;  Location: [facility];  Service: Orthopedics;  Laterality: Left;  . TOTAL KNEE ARTHROPLASTY Left 2008  . TOTAL KNEE ARTHROPLASTY Right 01/14/2015   Procedure: TOTAL KNEE ARTHROPLASTY;  Surgeon: Hessie Knows, MD;  Location: [facility] ORS;  Service: Orthopedics;  Laterality: Right;    There were no vitals filed for this visit.  Subjective Assessment - 06/11/19 1127    Subjective  Patient got here almost 2 hours early due to her transportation van picking her up early. Sat in the waiting room for that time. States she as 8/10 familiar pain at the left  anterior left knee upon arrival. Was a little sore after last session. HEP is going good.    Pertinent History  Patient is a 59 y.o. female who presents to outpatient physical therapy with a referral for medical diagnosis left femur ORIF. This patient's chief complaints consist of left knee pain, stiffness, imbalance, weakness, decreased function and ambulatory ability 2/2 to distal femur fracture from fall with ORIF performed 02/05/2019 leading to the following functional deficits: difficulty with all activities that require weight bearing and ambulation including ADLs, IADLs, household and community ambulation,  grocery shopping, cooking, stairs, squatting, cleaning, lifting, bending, etc. 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 Denies history of MI, cancer, seizures, stroke.    Currently in Pain?  Yes    Pain Score  8         OBJECTIVE FOTO = 70 (05/21/2019)  TREATMENT:  Therapeutic exercise:to centralize symptoms and improve ROM, strength, muscular endurance, and activity tolerance required for successful completion of functional activities.(supervision - CGA for safety). -recumbent bike pedaling no resistance on seat setting as close as possible to stretch L knee flexion (seat setting 8 x42mn, setting 5x 145m, setting 4x 2 min, setting 2 x 2 min, setting 1 x1 min). To improve ROM for L knee flexion. - bent over hip extension (leaning on elevated surface), 2x15each side,2# AW, 4 countH. - leg press (single leg)              L 3x10 at 30#,              R 3x10 at 30#.   Manual therapy: to reduce pain and tissue tension, improve range of motion, neuromodulation, in order to promote improved ability to complete functional activities. - STM to left quadriceps muscles focusing on vastus lateralis and TFL to improve  Educated on how to use LAX/tennis ball for same purpose at home including HEP handout (see below). Pt reports decreased discomfort following.    HOME EXERCISE PROGRAM Access Code: [email or web address]: [email or web address] Date: 04/25/2019 Prepared by: SaRosita KeaExercises Supine Heel Slide with Strap - 2 x daily - 20 reps - 5 seconds hold Sit to Stand - 2 x daily - 3 sets - 10 reps Diagonal Hip Extension with Resistance - 2 x daily - 3 sets - 15 reps - 4 count hold      PT Education - 06/11/19 1158    Education Details  Exercise  purpose/form. Self management techniques.    Person(s) Educated  Patient    Methods  Explanation;Demonstration;Tactile cues;Verbal cues;Handout    Comprehension  Verbalized understanding;Returned demonstration;Verbal cues required;Tactile cues required;Need further instruction       PT Short Term Goals - 04/05/19 1335      PT SHORT TERM GOAL #1   Title  Be independent with initial home exercise program for self-management of symptoms.    Baseline  Initial HEP provided at IE (03/27/2019);    Time  3    Period  Weeks    Status  Achieved    Target Date  04/17/19        PT Long Term Goals - 05/23/19 1429      PT LONG TERM GOAL #1   Title  Be independent with a long-term home exercise program for self-management of symptoms.   ALL LONG  TERM GOALS TARGET DATE 06/19/2019; updated to 07/18/2019   Baseline  Initial HEP provided at IE (03/27/2019); currently participating (04/05/2019; 05/09/2019; 05/23/2019);    Time  8    Period  Weeks    Status  Partially Met      PT LONG TERM GOAL #2   Title  Demonstrate improved FOTO score to equal or greater than 70 to demonstrate improvement in overall condition and self-reported functional ability.    Baseline  58 (03/27/2019); 53 (04/05/2019); 63 (05/09/2019); FOTO = 70 (05/21/2019);    Time  12    Period  Weeks    Status  Achieved      PT LONG TERM GOAL #3   Title  Patient will complete 5TSTS in equal or less than 12 seconds with minimal to no shift towards R LE and no UE support to demonstrate improved functional LE strength and power for transfers and daily mobility.    Baseline  16 seconds with B UE push off from 18.5 inch plinth, sisgnificant shift to right (03/27/2019); 18 seconds with no UE push off from 18 inch chair (04/05/2019); 14 seconds with no UE support from 18 inch chair (05/09/2019); 15 seconds with no UE support from 18 inch chair (05/23/2019);    Time  8    Period  Weeks    Status  Partially Met      PT LONG TERM GOAL #4   Title  Patient will  ambulate at least 1000 feet during 6 Minute Walk Test in order to demonstrate ability to complete activities such as shopping and [place] ambulation.    Baseline  able to ambulate 45 feet with CGA and no AD (03/27/2019); 611 feet with SPC and SBA for safety. Demo antalgic gait favoring L LE with limited weight shift to left. (04/05/2019); 1031 feet with no AD but antalgic gait favoring L LE (05/23/2019);    Time  12    Period  Weeks    Status  Achieved      PT LONG TERM GOAL #5   Title  Complete community, work and/or recreational activities without limitation due to current condition.    Baseline  Difficulty with all activities that require weight bearing and ambulation including ADLs, IADLs, household and community ambulation, grocery shopping, cooking, stairs, squatting, cleaning, lifting, bending, etc (03/27/2019); improved ability to don socks (04/05/2019); improved but still has some difficulty with stairs, bending, and lifting (05/09/2019); improved but has difficulty with stairs, bending, lifting, uneven ground (05/23/2019);    Time  8    Period  Weeks    Status  Partially Met            Plan - 06/11/19 1152    Clinical Impression Statement  Patient tolerated treatment well overall. Continues to have limited L knee flexion with pain at lateral knee, improved discomfort with manual therapy. Continues to have strength deficits in hips and quads. Continued working on strengthening as tolerated. Patient would benefit from continued management of limiting condition by skilled physical therapist to address remaining impairments and functional limitations to work towards stated goals and return to PLOF or maximal functional independence.    Personal Factors and Comorbidities  Age;Comorbidity 3+;Other;Past/Current Experience;Social Background;Time since onset of injury/illness/exacerbation;Transportation;Fitness    Comorbidities  Relevant past medical history and comorbidities include HTN, COPD,  DDD, diabetes mellitus II, DJD, dysrhythmia, fatty liver, GERD, arthritis, recovering alcoholic (sober for last 4 years), renal disorder, ulcerative colitis, hx of bilateral TKA (2008 L and 2017  R).    Examination-Activity Limitations  Bathing;Bed Mobility;Bend;Locomotion Level;Lift;Stairs;Squat;Hygiene/Grooming;Stand;Toileting;Carry;Transfers;Dressing    Examination-Participation Restrictions  Cleaning;Shop;Yard Work;Meal Prep;Community Activity;Interpersonal Relationship    Stability/Clinical Decision Making  Evolving/Moderate complexity    Rehab Potential  Good    PT Frequency  2x / week    PT Duration  8 weeks    PT Treatment/Interventions  ADLs/Self Care Home Management;Cryotherapy;Electrical Stimulation;Moist Heat;Gait training;Stair training;Functional mobility training;Therapeutic activities;Therapeutic exercise;Balance training;Neuromuscular re-education;Patient/family education;Manual techniques;Scar mobilization;Passive range of motion;Dry needling;Spinal Manipulations;Joint Manipulations    PT Next Visit Plan  progressive LE strengthening, ROM, balance training as tolerated    PT Home Exercise Plan  Medbridge Access Code: [email or web address]    Consulted and Agree with Plan of Care  Patient       Patient will benefit from skilled therapeutic intervention in order to improve the following deficits and impairments:  Abnormal gait, Decreased knowledge of use of DME, Increased fascial restricitons, Pain, Improper body mechanics, Hypermobility, Decreased mobility, Decreased scar mobility, Decreased activity tolerance, Decreased endurance, Decreased range of motion, Decreased strength, Hypomobility, Impaired perceived functional ability, Obesity, Impaired flexibility, Difficulty walking, Decreased balance  Visit Diagnosis: Difficulty in walking, not elsewhere classified  History of falling  Left knee pain, unspecified chronicity  Muscle weakness (generalized)     Problem List Patient Active  Problem List   Diagnosis Date Noted  . Closed displaced supracondylar fracture of distal end of left femur without intracondylar extension ([place]) 02/04/2019  . Femur fracture ([place]) 02/04/2019  . Leukocytosis 02/04/2019  . Alcohol use 02/04/2019  . Right leg weakness 02/14/2018  . Elevated bilirubin 02/14/2018  . SIRS (systemic inflammatory response syndrome) ([place]) 01/30/2018  . Hypoglycemia 01/30/2018  . Lactic acidosis 01/30/2018  . Alcohol abuse 01/30/2018  . Abdominal pain, chronic, epigastric   . Pancreatitis 08/29/2017  . Diabetes ([place]) 08/29/2017  . HTN (hypertension) 08/29/2017  . HLD (hyperlipidemia) 08/29/2017  . GERD (gastroesophageal reflux disease) 08/29/2017  . Hypokalemia 08/29/2017  . Hypocalcemia 08/29/2017  . Primary osteoarthritis of right knee 01/14/2015    Everlean Alstrom. Graylon Good, PT, DPT 06/11/19, 12:00 PM  [facility] [address], [place], [address] Phone: [phone]   Fax:  [phone]  Name: Sheila [name] MRN: [id number] Date of Birth: [birth year]

## 2019-06-13 ENCOUNTER — Encounter: Payer: Medicaid Other | Admitting: Physical Therapy

## 2019-06-14 ENCOUNTER — Ambulatory Visit: Payer: Medicaid Other | Admitting: Physical Therapy

## 2019-06-18 ENCOUNTER — Ambulatory Visit: Payer: Medicaid Other | Admitting: Physical Therapy

## 2019-06-18 ENCOUNTER — Other Ambulatory Visit: Payer: Self-pay

## 2019-06-18 ENCOUNTER — Encounter: Payer: Self-pay | Admitting: Physical Therapy

## 2019-06-18 DIAGNOSIS — R262 Difficulty in walking, not elsewhere classified: Secondary | ICD-10-CM

## 2019-06-18 DIAGNOSIS — M25562 Pain in left knee: Secondary | ICD-10-CM

## 2019-06-18 DIAGNOSIS — Z9181 History of falling: Secondary | ICD-10-CM

## 2019-06-18 DIAGNOSIS — M6281 Muscle weakness (generalized): Secondary | ICD-10-CM

## 2019-06-18 NOTE — Therapy (Signed)
[facility] [address], [place], [address] Phone: [phone]   Fax:  [phone]  Physical Therapy Treatment  Patient Details  Name: Sheila [name] MRN: [id number] Date of Birth: 01-10-1960 Referring Provider (PT): Altamese [place], MD   Encounter Date: 06/18/2019   PT End of Session - 06/18/19 1126    Visit Number 17    Number of Visits 27    Date for PT Re-Evaluation 07/18/19    Authorization Type Medicaid reporting period from 03/27/2019    Authorization Time Period [employer] auth 12 PT visits 05/31/19 - 07/11/19    Authorization - Visit Number 3    Authorization - Number of Visits 12    Progress Note Due on Visit 10    PT Start Time 1120    PT Stop Time 1200    PT Time Calculation (min) 40 min    Equipment Utilized During Treatment Gait belt    Activity Tolerance Patient tolerated treatment well;No increased pain    Behavior During Therapy WFL for tasks assessed/performed           Past Medical History:  Diagnosis Date  . Anemia   . Arthritis   . Asthma   . COPD (chronic obstructive pulmonary disease) ([place])   . DDD (degenerative disc disease), lumbar   . Diabetes mellitus without complication ([place])    on metformin  . DJD (degenerative joint disease)   . Dysrhythmia   . Fatty liver   . Fibroids   . GERD (gastroesophageal reflux disease)   . Glaucoma   . Hyperlipidemia   . Hypertension   . IBS (irritable bowel syndrome)   . Lumbago   . Neuromuscular disorder ([place])   . 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[place]) 34/7425  . Renal disorder   . Ulcerative colitis ([place])    on mesalamine    Past Surgical History:  Procedure Laterality Date  . COLONOSCOPY WITH PROPOFOL N/A 08/19/2015   Procedure: COLONOSCOPY WITH PROPOFOL;  Surgeon: Lollie Sails, MD;  Location: [facility] ENDOSCOPY;  Service: Endoscopy;  Laterality: N/A;  . COLONOSCOPY WITH PROPOFOL N/A 12/08/2015   Procedure: COLONOSCOPY WITH PROPOFOL;   Surgeon: Lollie Sails, MD;  Location: [facility] ENDOSCOPY;  Service: Endoscopy;  Laterality: N/A;  . COLONOSCOPY WITH PROPOFOL N/A 12/09/2015   Procedure: COLONOSCOPY WITH PROPOFOL;  Surgeon: Lollie Sails, MD;  [facility]: [facility] END[facility]COPY;  Service: Endoscopy;  Laterality: N/A;  . ESOPHAGOGASTRODUODENOSCOPY (EGD) WITH PROPOFOL N/A 11/08/2017   Procedure: ESOPHAGOGASTRODUODENOSCOPY (EGD) WITH PROPOFOL;  Surgeon: Lollie Sails, MD;  Location: [facility] ENDOSCOPY;  Service: Endoscopy;  Laterality: N/A;  . JOINT REPLACEMENT Left 04/19/2006  . JOINT REPLACEMENT Right 01/14/2015  . ORIF FEMUR FRACTURE Left 02/05/2019  . ORIF FEMUR FRACTURE Left 02/05/2019   Procedure: OPEN REDUCTION INTERNAL FIXATION (ORIF) DISTAL FEMUR FRACTURE;  Surgeon: Altamese [place], MD;  Location: [facility];  Service: Orthopedics;  Laterality: Left;  . TOTAL KNEE ARTHROPLASTY Left 2008  . TOTAL KNEE ARTHROPLASTY Right 01/14/2015   Procedure: TOTAL KNEE ARTHROPLASTY;  Surgeon: Hessie Knows, MD;  Location: [facility] ORS;  Service: Orthopedics;  Laterality: Right;    There were no vitals filed for this visit.   Subjective Assessment - 06/18/19 1124    Subjective Patient reports she is feeling well today and has no pain upon arrival. States she had another cookout this weekend and has been getting around well. No excessive soreness or pain following last treatment session. Found a tennis ball and has been  using it for self-mobilization successfully.    Pertinent History Patient is a 59 y.o. female who presents to outpatient physical therapy with a referral for medical diagnosis left femur ORIF. This patient's chief complaints consist of left knee pain, stiffness, imbalance, weakness, decreased function and ambulatory ability 2/2 to distal femur fracture from fall with ORIF performed 02/05/2019 leading to the following functional deficits: difficulty with all activities that require weight bearing and ambulation including ADLs, IADLs, household and  community ambulation, grocery shopping, cooking, stairs, squatting, cleaning, lifting, bending, etc. 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 Denies history of MI, cancer, seizures, stroke.    Currently in Pain? No/denies            OBJECTIVE FOTO = 70 (05/21/2019)  TREATMENT:  Therapeutic exercise:to centralize symptoms and improve ROM, strength, muscular endurance, and activity tolerance required for successful completion of functional activities.(supervision - CGA for safety). -- NuStep level 4 using bilateral lower extremities  only. Seat setting 6. For improved extremity mobility, muscular endurance, and activity tolerance; and to induce the analgesic effect of aerobic exercise, stimulate improved joint nutrition, and prepare body structures and systems for following interventions. x 5 minutes during subjective exam. Instructed to keep SPM above 80. Average SPM = 76. - bent over hip extension (leaning on elevated surface), 2x15each side,3# AW,4 countH. - leg press (single leg, lots of fatigue, no pain) L3x15 at 35#,  R 3x15 at 35#.  - standing hip adduction on floor slider with yellow theraband loop, 2x10 each side. Barely acceptable form.  - standing hip adduction sliding bent knee on stool x10 each side. Struggled with form.  -Runner's step up (contralateralknee high). 1x10 at 8 inch step each side and 1x10 at 6 inch step each side. B UEsupport as neededin hallway on wall at side.CGAfor safety. Very fatigued from prior exericse.   HOME EXERCISE PROGRAM Access Code: [email or web address] URL: [email or web address] Date: 04/25/2019 Prepared by: Rosita Kea  Exercises Supine Heel Slide with Strap - 2 x daily - 20 reps -  5 seconds hold Sit to Stand - 2 x daily - 3 sets - 10 reps Diagonal Hip Extension with Resistance - 2 x daily - 3 sets - 15 reps - 4 count hold        PT Education - 06/18/19 1125    Education Details Exercise purpose/form. Self management techniques.    Person(s) Educated Patient    Methods Explanation;Demonstration;Tactile cues;Verbal cues    Comprehension Verbalized understanding;Returned demonstration;Verbal cues required;Tactile cues required;Need further instruction            PT Short Term Goals - 04/05/19 1335      PT SHORT TERM GOAL #1   Title Be independent with initial home exercise program for self-management of symptoms.    Baseline Initial HEP provided at [facility] (03/27/2019);    Time 3    Period Weeks    Status Achieved    Target Date 04/17/19             PT Long Term Goals - 05/23/19 1429      PT LONG TERM GOAL #1   Title Be independent with a long-term home exercise program for self-management of symptoms.   ALL LONG TERM GOALS TARGET DATE 06/19/2019; updated to 07/18/2019   Baseline Initial HEP provided at  IE (03/27/2019); currently participating (04/05/2019; 05/09/2019; 05/23/2019);    Time 8    Period Weeks    Status Partially Met      PT LONG TERM GOAL #2   Title Demonstrate improved FOTO score to equal or greater than 70 to demonstrate improvement in overall condition and self-reported functional ability.    Baseline 58 (03/27/2019); 53 (04/05/2019); 63 (05/09/2019); FOTO = 70 (05/21/2019);    Time 12    Period Weeks    Status Achieved      PT LONG TERM GOAL #3   Title Patient will complete 5TSTS in equal or less than 12 seconds with minimal to no shift towards R LE and no UE support to demonstrate improved functional LE strength and power for transfers and daily mobility.    Baseline 16 seconds with B UE push off from 18.5 inch plinth, sisgnificant shift to right (03/27/2019); 18 seconds with no UE push off from 18 inch chair (04/05/2019); 14 seconds with no UE  support from 18 inch chair (05/09/2019); 15 seconds with no UE support from 18 inch chair (05/23/2019);    Time 8    Period Weeks    Status Partially Met      PT LONG TERM GOAL #4   Title Patient will ambulate at least 1000 feet during 6 Minute Walk Test in order to demonstrate ability to complete activities such as shopping and [place] ambulation.    Baseline able to ambulate 45 feet with CGA and no AD (03/27/2019); 611 feet with SPC and SBA for safety. Demo antalgic gait favoring L LE with limited weight shift to left. (04/05/2019); 1031 feet with no AD but antalgic gait favoring L LE (05/23/2019);    Time 12    Period Weeks    Status Achieved      PT LONG TERM GOAL #5   Title Complete community, work and/or recreational activities without limitation due to current condition.    Baseline Difficulty with all activities that require weight bearing and ambulation including ADLs, IADLs, household and community ambulation, grocery shopping, cooking, stairs, squatting, cleaning, lifting, bending, etc (03/27/2019); improved ability to don socks (04/05/2019); improved but still has some difficulty with stairs, bending, and lifting (05/09/2019); improved but has difficulty with stairs, bending, lifting, uneven ground (05/23/2019);    Time 8    Period Weeks    Status Partially Met                 Plan - 06/18/19 1209    Clinical Impression Statement Patient tolerated treatment well overall and demonstrated significant fatigue by end of session. Able to increase reps and resistance on quad exercises and resistance on hip exercises. Continues to ambulate with limp that suggests L hip weakness.  Patient would benefit from continued management of limiting condition by skilled physical therapist to address remaining impairments and functional limitations to work towards stated goals and return to PLOF or maximal functional independence.    Personal Factors and Comorbidities Age;Comorbidity  3+;Other;Past/Current Experience;Social Background;Time since onset of injury/illness/exacerbation;Transportation;Fitness    Comorbidities Relevant past medical history and comorbidities include HTN, COPD, DDD, diabetes mellitus II, DJD, dysrhythmia, fatty liver, GERD, arthritis, recovering alcoholic (sober for last 4 years), renal disorder, ulcerative colitis, 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    Examination-Activity Limitations Bathing;Bed Mobility;Bend;Locomotion Level;Lift;Stairs;Squat;Hygiene/Grooming;Stand;Toileting;Carry;Transfers;Dressing    Examination-Participation Restrictions Cleaning;Shop;Yard Work;Meal Prep;Community Activity;Interpersonal Relationship    Stability/Clinical Decision Making Evolving/Moderate complexity    Rehab Potential Good  PT Frequency 2x / week    PT Duration 8 weeks    PT Treatment/Interventions ADLs/Self Care Home Management;Cryotherapy;Electrical Stimulation;Moist Heat;Gait training;Stair training;Functional mobility training;Therapeutic activities;Therapeutic exercise;Balance training;Neuromuscular re-education;Patient/family education;Manual techniques;Scar mobilization;Passive range of motion;Dry needling;Spinal Manipulations;Joint Manipulations    PT Next Visit Plan progressive LE strengthening, ROM, balance training as tolerated    PT Home Exercise Plan Medbridge Access Code: [email or web address]    Consulted and Agree with Plan of Care Patient           Patient will benefit from skilled therapeutic intervention in order to improve the following deficits and impairments:  Abnormal gait, Decreased knowledge of use of DME, Increased fascial restricitons, Pain, Improper body mechanics, Hypermobility, Decreased mobility, Decreased scar mobility, Decreased activity tolerance, Decreased endurance, Decreased range of motion, Decreased strength, Hypomobility, Impaired perceived functional ability, Obesity, Impaired flexibility, Difficulty walking, Decreased  balance  Visit Diagnosis: Difficulty in walking, not elsewhere classified  History of falling  Left knee pain, unspecified chronicity  Muscle weakness (generalized)     Problem List Patient Active Problem List   Diagnosis Date Noted  . Closed displaced supracondylar fracture of distal end of left femur without intracondylar extension ([place]) 02/04/2019  . Femur fracture ([place]) 02/04/2019  . Leukocytosis 02/04/2019  . Alcohol use 02/04/2019  . Right leg weakness 02/14/2018  . Elevated bilirubin 02/14/2018  . SIRS (systemic inflammatory response syndrome) ([place]) 01/30/2018  . Hypoglycemia 01/30/2018  . Lactic acidosis 01/30/2018  . Alcohol abuse 01/30/2018  . Abdominal pain, chronic, epigastric   . Pancreatitis 08/29/2017  . Diabetes ([place]) 08/29/2017  . HTN (hypertension) 08/29/2017  . HLD (hyperlipidemia) 08/29/2017  . GERD (gastroesophageal reflux disease) 08/29/2017  . Hypokalemia 08/29/2017  . Hypocalcemia 08/29/2017  . Primary osteoarthritis of right knee 01/14/2015    Everlean Alstrom. Graylon Good, PT, DPT 06/18/19, 12:10 PM  [facility] [address], [place], [address] Phone: [phone]   Fax:  [phone]  Name: DALMA PANCHAL MRN: [id number] Date of Birth: [birth year]

## 2019-06-19 ENCOUNTER — Ambulatory Visit: Payer: Medicaid Other | Admitting: Physical Therapy

## 2019-06-19 ENCOUNTER — Encounter: Payer: Self-pay | Admitting: Physical Therapy

## 2019-06-19 DIAGNOSIS — M25562 Pain in left knee: Secondary | ICD-10-CM

## 2019-06-19 DIAGNOSIS — Z9181 History of falling: Secondary | ICD-10-CM

## 2019-06-19 DIAGNOSIS — R262 Difficulty in walking, not elsewhere classified: Secondary | ICD-10-CM

## 2019-06-19 DIAGNOSIS — M6281 Muscle weakness (generalized): Secondary | ICD-10-CM

## 2019-06-19 NOTE — Therapy (Signed)
[facility] [address] [place], [address] Phone: [phone]   Fax:  [phone]  Physical Therapy Treatment  Patient Details  Name: Sheila [name] MRN: [id number] Date of Birth: 04-Dec-1960 Referring Provider (PT): Altamese Macon, MD   Encounter Date: 06/19/2019   PT End of Session - 06/19/19 1503    Visit Number 18    Number of Visits 27    Date for PT Re-Evaluation 07/18/19    Authorization Type Medicaid reporting period from 03/27/2019    Authorization Time Period [employer] auth 12 PT visits 05/31/19 - 07/11/19    Authorization - Visit Number 4    Authorization - Number of Visits 12    Progress Note Due on Visit 10    PT Start Time 1033    PT Stop Time 1111    PT Time Calculation (min) 38 min    Equipment Utilized During Treatment Gait belt    Activity Tolerance Patient tolerated treatment well;Patient limited by pain    Behavior During Therapy [facility] for tasks assessed/performed           Past Medical History:  Diagnosis Date  . Anemia   . Arthritis   . Asthma   . COPD (chronic obstructive pulmonary disease) ([place])   . DDD (degenerative disc disease), lumbar   . Diabetes mellitus without complication ([place])    on metformin  . DJD (degenerative joint disease)   . Dysrhythmia   . Fatty liver   . Fibroids   . GERD (gastroesophageal reflux disease)   . Glaucoma   . Hyperlipidemia   . Hypertension   . IBS (irritable bowel syndrome)   . Lumbago   . Neuromuscular disorder ([place])   . 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[place]) 33/0076  . Renal disorder   . Ulcerative colitis ([place])    on mesalamine    Past Surgical History:  Procedure Laterality Date  . COLONOSCOPY WITH PROPOFOL N/A 08/19/2015   Procedure: COLONOSCOPY WITH PROPOFOL;  Surgeon: Lollie Sails, MD;  Location: [facility] ENDOSCOPY;  Service: Endoscopy;  Laterality: N/A;  . COLONOSCOPY WITH PROPOFOL N/A 12/08/2015   Procedure: COLONOSCOPY WITH PROPOFOL;   Surgeon: Lollie Sails, MD;  Location: [facility] ENDOSCOPY;  Service: Endoscopy;  Laterality: N/A;  . COLONOSCOPY WITH PROPOFOL N/A 12/09/2015   Procedure: COLONOSCOPY WITH PROPOFOL;  Surgeon: Lollie Sails, MD;  Location: [facility] ENDOSCOPY;  Service: Endoscopy;  Laterality: N/A;  . ESOPHAGOGASTRODUODENOSCOPY (EGD) WITH PROPOFOL N/A 11/08/2017   Procedure: ESOPHAGOGASTRODUODENOSCOPY (EGD) WITH PROPOFOL;  Surgeon: Lollie Sails, MD;  Location: [facility] ENDOSCOPY;  Service: Endoscopy;  Laterality: N/A;  . JOINT REPLACEMENT Left 04/19/2006  . JOINT REPLACEMENT Right 01/14/2015  . ORIF FEMUR FRACTURE Left 02/05/2019  . ORIF FEMUR FRACTURE Left 02/05/2019   Procedure: OPEN REDUCTION INTERNAL FIXATION (ORIF) DISTAL FEMUR FRACTURE;  Surgeon: Altamese Putnam, MD;  Location: [place];  Service: Orthopedics;  Laterality: Left;  . TOTAL KNEE ARTHROPLASTY Left 2008  . TOTAL KNEE ARTHROPLASTY Right 01/14/2015   Procedure: TOTAL KNEE ARTHROPLASTY;  Surgeon: Hessie Knows, MD;  Location: [facility] ORS;  Service: Orthopedics;  Laterality: Right;    There were no vitals filed for this visit.   Subjective Assessment - 06/19/19 1501    Subjective Patient reports she has no pain upon arrival today. She states she had no excessive pain or soreness following last session except for a bit of discomfort in her posterior L leg that was common for her before her injury. She  states it is now gone. Was here yesterday.    Pertinent History Patient is a 59 y.o. female who presents to outpatient physical therapy with a referral for medical diagnosis left femur ORIF. This patient's chief complaints consist of left knee pain, stiffness, imbalance, weakness, decreased function and ambulatory ability 2/2 to distal femur fracture from fall with ORIF performed 02/05/2019 leading to the following functional deficits: difficulty with all activities that require weight bearing and ambulation including ADLs, IADLs, household and community ambulation,  grocery shopping, cooking, stairs, squatting, cleaning, lifting, bending, etc. 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 Denies history of MI, cancer, seizures, stroke.    Currently in Pain? No/denies             OBJECTIVE FOTO = 70 (05/21/2019)  TREATMENT:  Therapeutic exercise:to centralize symptoms and improve ROM, strength, muscular endurance, and activity tolerance required for successful completion of functional activities.(supervision - CGA for safety). -NuStep level4using bilateral lower extremities only. Seat setting 6. For improved extremity mobility, muscular endurance, and activity tolerance; and to induce the analgesic effect of aerobic exercise, stimulate improved joint nutrition, and prepare body structures and systems for following interventions. x8mnutes during subjective exam. Instructed to keep SPM above 80. Average SPM = 82. Very winded following and burning in quads that dissipated quickly. - bent over hip extension (leaning on elevatedsurface), 2x15each side,4# AW.  -Runner's step up (contralateralknee high).2x10 at 8 inch step each side. B UEsupport as neededin hallway on wall at side.CGAfor safety. Very fatigued from prior exericse.  - standing hip adduction on floor slider with red theraband loop, 2x15 each side. Produced pain at L quad. Braced in hallway for improved form.  - leg press (single leg, lots of effort, no pain) L3x15 at 35#,  R3x15 at35#.    HOME EXERCISE PROGRAM Access Code: [email or web address]: [email or web address] Date: 04/25/2019 Prepared by: SRosita Kea Exercises Supine Heel Slide with Strap - 2 x daily - 20 reps - 5 seconds hold Sit to Stand - 2 x daily - 3  sets - 10 reps Diagonal Hip Extension with Resistance - 2 x daily - 3 sets - 15 reps - 4 count hold     PT Education - 06/19/19 1502    Education Details Exercise purpose/form. Self management techniques.    Person(s) Educated Patient    Methods Explanation;Demonstration;Tactile cues;Verbal cues    Comprehension Verbalized understanding;Returned demonstration;Verbal cues required;Tactile cues required;Need further instruction            PT Short Term Goals - 04/05/19 1335      PT SHORT TERM GOAL #1   Title Be independent with initial home exercise program for self-management of symptoms.    Baseline Initial HEP provided at IE (03/27/2019);    Time 3    Period Weeks    Status Achieved    Target Date 04/17/19             PT Long Term Goals - 05/23/19 1429      PT LONG TERM GOAL #1   Title Be independent with a long-term home exercise program for self-management of symptoms.   ALL LONG TERM GOALS TARGET DATE 06/19/2019; updated to 07/18/2019   Baseline Initial HEP provided at IE (03/27/2019); currently participating (04/05/2019; 05/09/2019; 05/23/2019);    Time 8  Period Weeks    Status Partially Met      PT LONG TERM GOAL #2   Title Demonstrate improved FOTO score to equal or greater than 70 to demonstrate improvement in overall condition and self-reported functional ability.    Baseline 58 (03/27/2019); 53 (04/05/2019); 63 (05/09/2019); FOTO = 70 (05/21/2019);    Time 12    Period Weeks    Status Achieved      PT LONG TERM GOAL #3   Title Patient will complete 5TSTS in equal or less than 12 seconds with minimal to no shift towards R LE and no UE support to demonstrate improved functional LE strength and power for transfers and daily mobility.    Baseline 16 seconds with B UE push off from 18.5 inch plinth, sisgnificant shift to right (03/27/2019); 18 seconds with no UE push off from 18 inch chair (04/05/2019); 14 seconds with no UE support from 18 inch chair (05/09/2019); 15 seconds  with no UE support from 18 inch chair (05/23/2019);    Time 8    Period Weeks    Status Partially Met      PT LONG TERM GOAL #4   Title Patient will ambulate at least 1000 feet during 6 Minute Walk Test in order to demonstrate ability to complete activities such as shopping and [place] ambulation.    Baseline able to ambulate 45 feet with CGA and no AD (03/27/2019); 611 feet with SPC and SBA for safety. Demo antalgic gait favoring L LE with limited weight shift to left. (04/05/2019); 1031 feet with no AD but antalgic gait favoring L LE (05/23/2019);    Time 12    Period Weeks    Status Achieved      PT LONG TERM GOAL #5   Title Complete community, work and/or recreational activities without limitation due to current condition.    Baseline Difficulty with all activities that require weight bearing and ambulation including ADLs, IADLs, household and community ambulation, grocery shopping, cooking, stairs, squatting, cleaning, lifting, bending, etc (03/27/2019); improved ability to don socks (04/05/2019); improved but still has some difficulty with stairs, bending, and lifting (05/09/2019); improved but has difficulty with stairs, bending, lifting, uneven ground (05/23/2019);    Time 8    Period Weeks    Status Partially Met                 Plan - 06/19/19 1506    Clinical Impression Statement Patient tolerated treatment well overall except she did have some pain in the left knee and inner leg with hip adduction with L LE moving. Modified exercise to less sets and patient recovered with rest. Continues to ambulate with limp favoring L LE and struggles with step up to 8 inch step on left side. Patient would benefit from continued management of limiting condition by skilled physical therapist to address remaining impairments and functional limitations to work towards stated goals and return to PLOF or maximal functional independence.    Personal Factors and Comorbidities Age;Comorbidity  3+;Other;Past/Current Experience;Social Background;Time since onset of injury/illness/exacerbation;Transportation;Fitness    Comorbidities Relevant past medical history and comorbidities include HTN, COPD, DDD, diabetes mellitus II, DJD, dysrhythmia, fatty liver, GERD, arthritis, recovering alcoholic (sober for last 4 years), renal disorder, ulcerative colitis, 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    Examination-Activity Limitations Bathing;Bed Mobility;Bend;Locomotion Level;Lift;Stairs;Squat;Hygiene/Grooming;Stand;Toileting;Carry;Transfers;Dressing    Examination-Participation Restrictions Cleaning;Shop;Yard Work;Meal Prep;Community Activity;Interpersonal Relationship    Stability/Clinical Decision Making Evolving/Moderate complexity    Rehab Potential Good  PT Frequency 2x / week    PT Duration 8 weeks    PT Treatment/Interventions ADLs/Self Care Home Management;Cryotherapy;Electrical Stimulation;Moist Heat;Gait training;Stair training;Functional mobility training;Therapeutic activities;Therapeutic exercise;Balance training;Neuromuscular re-education;Patient/family education;Manual techniques;Scar mobilization;Passive range of motion;Dry needling;Spinal Manipulations;Joint Manipulations    PT Next Visit Plan progressive LE strengthening, ROM, balance training as tolerated    PT Home Exercise Plan Medbridge Access Code: [email or web address]    Consulted and Agree with Plan of Care Patient           Patient will benefit from skilled therapeutic intervention in order to improve the following deficits and impairments:  Abnormal gait, Decreased knowledge of use of DME, Increased fascial restricitons, Pain, Improper body mechanics, Hypermobility, Decreased mobility, Decreased scar mobility, Decreased activity tolerance, Decreased endurance, Decreased range of motion, Decreased strength, Hypomobility, Impaired perceived functional ability, Obesity, Impaired flexibility, Difficulty walking, Decreased  balance  Visit Diagnosis: Difficulty in walking, not elsewhere classified  History of falling  Left knee pain, unspecified chronicity  Muscle weakness (generalized)     Problem List Patient Active Problem List   Diagnosis Date Noted  . Closed displaced supracondylar fracture of distal end of left femur without intracondylar extension ([place]) 02/04/2019  . Femur fracture ([place]) 02/04/2019  . Leukocytosis 02/04/2019  . Alcohol use 02/04/2019  . Right leg weakness 02/14/2018  . Elevated bilirubin 02/14/2018  . SIRS (systemic inflammatory response syndrome) ([place]) 01/30/2018  . Hypoglycemia 01/30/2018  . Lactic acidosis 01/30/2018  . Alcohol abuse 01/30/2018  . Abdominal pain, chronic, epigastric   . Pancreatitis 08/29/2017  . Diabetes ([place]) 08/29/2017  . HTN (hypertension) 08/29/2017  . HLD (hyperlipidemia) 08/29/2017  . GERD (gastroesophageal reflux disease) 08/29/2017  . Hypokalemia 08/29/2017  . Hypocalcemia 08/29/2017  . Primary osteoarthritis of right knee 01/14/2015    Everlean Alstrom. Graylon Good, PT, DPT 06/19/19, 3:06 PM  [facility] [address] [place], [address] Phone: [phone]   Fax:  [phone]  Name: Sheila [name] MRN: [id number] Date of Birth: 1960/03/03

## 2019-06-20 ENCOUNTER — Encounter: Payer: Medicaid Other | Admitting: Physical Therapy

## 2019-06-21 ENCOUNTER — Encounter: Payer: Medicaid Other | Admitting: Physical Therapy

## 2019-06-25 ENCOUNTER — Ambulatory Visit: Payer: Medicaid Other | Admitting: Physical Therapy

## 2019-06-25 ENCOUNTER — Encounter: Payer: Self-pay | Admitting: Physical Therapy

## 2019-06-25 ENCOUNTER — Other Ambulatory Visit: Payer: Self-pay

## 2019-06-25 DIAGNOSIS — R262 Difficulty in walking, not elsewhere classified: Secondary | ICD-10-CM | POA: Diagnosis not present

## 2019-06-25 DIAGNOSIS — M6281 Muscle weakness (generalized): Secondary | ICD-10-CM

## 2019-06-25 DIAGNOSIS — Z9181 History of falling: Secondary | ICD-10-CM

## 2019-06-25 DIAGNOSIS — M25562 Pain in left knee: Secondary | ICD-10-CM

## 2019-06-25 NOTE — Therapy (Signed)
[facility] [address] [place], [address] Phone: [phone]   Fax:  [phone]  Physical Therapy Treatment / Discharge Summary Reporting Period: 03/27/2019 - 06/25/2019  Patient Details  Name: Sheila [name] MRN: [id number] Date of Birth: 1960-01-06 Referring Provider (PT): Altamese [place], MD   Encounter Date: 06/25/2019   PT End of Session - 06/25/19 1320    Visit Number 19    Number of Visits 27    Date for PT Re-Evaluation 07/18/19    Authorization Type Medicaid reporting period from 03/27/2019    Authorization Time Period [employer] auth 12 PT visits 05/31/19 - 07/11/19    Authorization - Visit Number 5    Authorization - Number of Visits 12    Progress Note Due on Visit 10    PT Start Time [place] brought her late   PT Stop Time 1355    PT Time Calculation (min) 40 min    Equipment Utilized During Treatment Gait belt    Activity Tolerance Patient tolerated treatment well    Behavior During Therapy [facility] for tasks assessed/performed           Past Medical History:  Diagnosis Date  . Anemia   . Arthritis   . Asthma   . COPD (chronic obstructive pulmonary disease) ([place])   . DDD (degenerative disc disease), lumbar   . Diabetes mellitus without complication ([place])    on metformin  . DJD (degenerative joint disease)   . Dysrhythmia   . Fatty liver   . Fibroids   . GERD (gastroesophageal reflux disease)   . Glaucoma   . Hyperlipidemia   . Hypertension   . IBS (irritable bowel syndrome)   . Lumbago   . Neuromuscular disorder ([place])   . 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[place]) 25/4270  . Renal disorder   . Ulcerative colitis ([place])    on mesalamine    Past Surgical History:  Procedure Laterality Date  . COLONOSCOPY WITH PROPOFOL N/A 08/19/2015   Procedure: COLONOSCOPY WITH PROPOFOL;  Surgeon: Lollie Sails, MD;  Location: [facility] ENDOSCOPY;  Service: Endoscopy;  Laterality: N/A;  . COLONOSCOPY WITH  PROPOFOL N/A 12/08/2015   Procedure: COLONOSCOPY WITH PROPOFOL;  Surgeon: Lollie Sails, MD;  Location: [facility] ENDOSCOPY;  Service: Endoscopy;  Laterality: N/A;  . COLONOSCOPY WITH PROPOFOL N/A 12/09/2015   Procedure: COLONOSCOPY WITH PROPOFOL;  Surgeon: Lollie Sails, MD;  Location: [facility] ENDOSCOPY;  Service: Endoscopy;  Laterality: N/A;  . ESOPHAGOGASTRODUODENOSCOPY (EGD) WITH PROPOFOL N/A 11/08/2017   Procedure: ESOPHAGOGASTRODUODENOSCOPY (EGD) WITH PROPOFOL;  Surgeon: Lollie Sails, MD;  Location: [facility] ENDOSCOPY;  Service: Endoscopy;  Laterality: N/A;  . JOINT REPLACEMENT Left 04/19/2006  . JOINT REPLACEMENT Right 01/14/2015  . ORIF FEMUR FRACTURE Left 02/05/2019  . ORIF FEMUR FRACTURE Left 02/05/2019   Procedure: OPEN REDUCTION INTERNAL FIXATION (ORIF) DISTAL FEMUR FRACTURE;  Surgeon: Altamese [place], MD;  Location: [facility];  Service: Orthopedics;  Laterality: Left;  . TOTAL KNEE ARTHROPLASTY Left 2008  . TOTAL KNEE ARTHROPLASTY Right 01/14/2015   Procedure: TOTAL KNEE ARTHROPLASTY;  Surgeon: Hessie Knows, MD;  Location: [facility] ORS;  Service: Orthopedics;  Laterality: Right;    There were no vitals filed for this visit.     Subjective Assessment - 06/25/19 1316    Subjective Patient reports she is feeling well today without pain upon arrival. She reports she saw Dr. Marcelino Scot since her last PT session. He was very please with her progress  and reccomended she discontinue using the [facility]. She states he though she could be done with physical therapy and although she feels like she has got a lot out of it, she feels ready to discharge today. No falls since last session.    Pertinent History Patient is a 59 y.o. female who presents to outpatient physical therapy with a referral for medical diagnosis left femur ORIF. This patient's chief complaints consist of left knee pain, stiffness, imbalance, weakness, decreased function and ambulatory ability 2/2 to distal femur fracture from fall with ORIF  performed 02/05/2019 leading to the following functional deficits: difficulty with all activities that require weight bearing and ambulation including ADLs, IADLs, household and community ambulation, grocery shopping, cooking, stairs, squatting, cleaning, lifting, bending, etc. 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 Denies history of MI, cancer, seizures, stroke.    Currently in Pain? No/denies           OBJECTIVE  FOTO = 69 (06/25/2019)  FUNCTIONAL/BALANCE TESTS:  5TSTS: 14 seconds with no UE push offfrom 18 inch plinth  Six Minute Walk Test: 1221 with no AD (SBA for safety but no stumbles). Demo mild compensated trendelenburg gait    TREATMENT:  Therapeutic exercise:to centralize symptoms and improve ROM, strength, muscular endurance, and activity tolerance required for successful completion of functional activities.(supervision - CGA for safety). -NuStep level5-4using bilateral lower extremities only. Seat setting 6. For improved extremity mobility, muscular endurance, and activity tolerance; and to induce the analgesic effect of aerobic exercise, stimulate improved joint nutrition, and prepare body structures and systems for following interventions. x38mnutes during subjective exam. Instructed to keep SPM above80. Average SPM = 73. Very winded following and burning in quads that dissipated quickly.  - measurements to assess progress including 6MWT, 5TSTS (see above).  - Education on diagnosis, prognosis, POC, anatomy and physiology of current condition.  - leg press (single leg, lots of effort, no pain) L3x15at 35#,  R3x15at35#.   - bent over hip extension (leaning on elevatedsurface), 3x15each side,5# AW.   Pt  required multimodal cuing for proper technique and to facilitate improved neuromuscular control, strength, range of motion, and functional ability resulting in improved performance and form.  HOME EXERCISE PROGRAM Access Code: [email or web address]: [email or web address] Date: 04/25/2019 Prepared by: SRosita Kea Exercises Supine Heel Slide with Strap - 2 x daily - 20 reps - 5 seconds hold Sit to Stand - 2 x daily - 3 sets - 10 reps Diagonal Hip Extension with Resistance - 2 x daily - 3 sets - 15 reps - 4 count hold    PT Education - 06/25/19 1319    Education Details Exercise purpose/form. Self management techniques. Discharge reccomendations.    Person(s) Educated Patient    Methods Explanation;Verbal cues    Comprehension Verbalized understanding;Returned demonstration;Verbal cues required            PT Short Term Goals - 04/05/19 1335      PT SHORT TERM GOAL #1   Title Be independent with initial home exercise program for self-management of symptoms.    Baseline Initial HEP provided at IE (03/27/2019);    Time 3    Period Weeks    Status Achieved    Target Date 04/17/19             PT Long Term  Goals - 06/25/19 1406      PT LONG TERM GOAL #1   Title Be independent with a long-term home exercise program for self-management of symptoms.   ALL LONG TERM GOALS TARGET DATE 06/19/2019; updated to 07/18/2019   Baseline Initial HEP provided at IE (03/27/2019); currently participating (04/05/2019; 05/09/2019; 05/23/2019);    Time 8    Period Weeks    Status Achieved      PT LONG TERM GOAL #2   Title Demonstrate improved FOTO score to equal or greater than 70 to demonstrate improvement in overall condition and self-reported functional ability.    Baseline 58 (03/27/2019); 53 (04/05/2019); 63 (05/09/2019); FOTO = 70 (05/21/2019); FOTO = 69 (06/25/2019);    Time 12    Period Weeks    Status Partially Met      PT LONG TERM GOAL #3   Title Patient will complete 5TSTS in equal or  less than 12 seconds with minimal to no shift towards R LE and no UE support to demonstrate improved functional LE strength and power for transfers and daily mobility.    Baseline 16 seconds with B UE push off from 18.5 inch plinth, sisgnificant shift to right (03/27/2019); 18 seconds with no UE push off from 18 inch chair (04/05/2019); 14 seconds with no UE support from 18 inch chair (05/09/2019); 15 seconds with no UE support from 18 inch chair (05/23/2019); 14 seconds with no UE support from 18 inch chair (06/25/2019);    Time 8    Period Weeks    Status Partially Met      PT LONG TERM GOAL #4   Title Patient will ambulate at least 1000 feet during 6 Minute Walk Test in order to demonstrate ability to complete activities such as shopping and [place] ambulation.    Baseline able to ambulate 45 feet with CGA and no AD (03/27/2019); 611 feet with SPC and SBA for safety. Demo antalgic gait favoring L LE with limited weight shift to left. (04/05/2019); 1031 feet with no AD but antalgic gait favoring L LE (05/23/2019); 1221 feet with no AD and mild gait deviation (06/25/2019);    Time 12    Period Weeks    Status Achieved      PT LONG TERM GOAL #5   Title Complete community, work and/or recreational activities without limitation due to current condition.    Baseline Difficulty with all activities that require weight bearing and ambulation including ADLs, IADLs, household and community ambulation, grocery shopping, cooking, stairs, squatting, cleaning, lifting, bending, etc (03/27/2019); improved ability to don socks (04/05/2019); improved but still has some difficulty with stairs, bending, and lifting (05/09/2019); improved but has difficulty with stairs, bending, lifting, uneven ground (05/23/2019);    Time 8    Period Weeks    Status Achieved                 Plan - 06/25/19 1409    Clinical Impression Statement Patient attended 19 physical therapy treatment sessions and make steady progress towards  goals. She has met or nearly met all goals except ROM which was resistant to treatment. Patient reports that she has returned to her PLOF and is confident in discharge with a long term HEP for independent prophylaxis. [name] now discharged from physical therapy due to achieving max improvement and returning to PLOF at this time    Personal Factors and Comorbidities Age;Comorbidity 3+;Other;Past/Current Experience;Social Background;Time since onset of injury/illness/exacerbation;Transportation;Fitness    Comorbidities Relevant past medical history  and comorbidities include HTN, COPD, DDD, diabetes mellitus II, DJD, dysrhythmia, fatty liver, GERD, arthritis, recovering alcoholic (sober for last 4 years), renal disorder, ulcerative colitis, 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    Examination-Activity Limitations Bathing;Bed Mobility;Bend;Locomotion Level;Lift;Stairs;Squat;Hygiene/Grooming;Stand;Toileting;Carry;Transfers;Dressing    Examination-Participation Restrictions Cleaning;Shop;Yard Work;Meal Prep;Community Activity;Interpersonal Relationship    Stability/Clinical Decision Making Evolving/Moderate complexity    Rehab Potential Good    PT Frequency 2x / week    PT Duration 8 weeks    PT Treatment/Interventions ADLs/Self Care Home Management;Cryotherapy;Electrical Stimulation;Moist Heat;Gait training;Stair training;Functional mobility training;Therapeutic activities;Therapeutic exercise;Balance training;Neuromuscular re-education;Patient/family education;Manual techniques;Scar mobilization;Passive range of motion;Dry needling;Spinal Manipulations;Joint Manipulations    PT Next Visit Plan Patient is now discharged due to reaching PLOF/maximal improvement    PT Home Exercise Plan Medbridge Access Code: [email or web address]    Consulted and Agree with Plan of Care Patient           Patient will benefit from skilled therapeutic intervention in order to improve the following deficits and impairments:  Abnormal  gait, Decreased knowledge of use of DME, Increased fascial restricitons, Pain, Improper body mechanics, Hypermobility, Decreased mobility, Decreased scar mobility, Decreased activity tolerance, Decreased endurance, Decreased range of motion, Decreased strength, Hypomobility, Impaired perceived functional ability, Obesity, Impaired flexibility, Difficulty walking, Decreased balance  Visit Diagnosis: Difficulty in walking, not elsewhere classified  History of falling  Left knee pain, unspecified chronicity  Muscle weakness (generalized)     Problem List Patient Active Problem List   Diagnosis Date Noted  . Closed displaced supracondylar fracture of distal end of left femur without intracondylar extension ([place]) 02/04/2019  . Femur fracture ([place]) 02/04/2019  . Leukocytosis 02/04/2019  . Alcohol use 02/04/2019  . Right leg weakness 02/14/2018  . Elevated bilirubin 02/14/2018  . SIRS (systemic inflammatory response syndrome) ([place]) 01/30/2018  . Hypoglycemia 01/30/2018  . Lactic acidosis 01/30/2018  . Alcohol abuse 01/30/2018  . Abdominal pain, chronic, epigastric   . Pancreatitis 08/29/2017  . Diabetes ([place]) 08/29/2017  . HTN (hypertension) 08/29/2017  . HLD (hyperlipidemia) 08/29/2017  . GERD (gastroesophageal reflux disease) 08/29/2017  . Hypokalemia 08/29/2017  . Hypocalcemia 08/29/2017  . Primary osteoarthritis of right knee 01/14/2015    Everlean Alstrom. Graylon Good, PT, DPT 06/25/19, 2:09 PM  [facility] [address] [place], [address] Phone: [phone]   Fax:  [phone]  Name: JESUSA STENERSON MRN: [id number] Date of Birth: [birth year]

## 2019-06-27 ENCOUNTER — Encounter: Payer: Medicaid Other | Admitting: Physical Therapy

## 2019-06-28 ENCOUNTER — Encounter: Payer: Medicaid Other | Admitting: Physical Therapy

## 2019-07-02 ENCOUNTER — Encounter: Payer: Medicaid Other | Admitting: Physical Therapy

## 2019-07-03 ENCOUNTER — Encounter: Payer: Medicaid Other | Admitting: Physical Therapy

## 2019-07-05 ENCOUNTER — Encounter: Payer: Medicaid Other | Admitting: Physical Therapy

## 2019-07-10 ENCOUNTER — Encounter: Payer: Medicaid Other | Admitting: Physical Therapy

## 2019-09-25 ENCOUNTER — Other Ambulatory Visit: Payer: Self-pay | Admitting: Internal Medicine

## 2019-09-25 DIAGNOSIS — Z1231 Encounter for screening mammogram for malignant neoplasm of breast: Secondary | ICD-10-CM

## 2019-11-01 ENCOUNTER — Ambulatory Visit
Admission: RE | Admit: 2019-11-01 | Discharge: 2019-11-01 | Disposition: A | Discharge status: Home / Self Care | Payer: Medicaid Other | Source: Ambulatory Visit | Attending: Internal Medicine | Admitting: Internal Medicine

## 2019-11-01 ENCOUNTER — Other Ambulatory Visit: Payer: Self-pay

## 2019-11-01 DIAGNOSIS — Z1231 Encounter for screening mammogram for malignant neoplasm of breast: Secondary | ICD-10-CM | POA: Diagnosis not present

## 2020-05-24 IMAGING — MR MR ABDOMEN WO/W CM MRCP
21 of 24 series · 43 of 48 positions shown · IV contrast (gadavist)
Comparison: CT scan 08/28/2017.

CLINICAL DATA: Abdominal pain with nausea and vomiting.

EXAM:
MRI ABDOMEN WITHOUT AND WITH CONTRAST (INCLUDING MRCP)
TECHNIQUE: Multiplanar multisequence MR imaging of the abdomen was performed
both before and after the administration of intravenous contrast.
Heavily T2-weighted images of the biliary and pancreatic ducts were
obtained, and three-dimensional MRCP images were rendered by post
processing.
CONTRAST:  6.5 cc Gadavist

[Series 3: bSSFP · coronal · 6.5mm · 0.74mm/px · 2 of 33 slices shown]
[im 1/33]
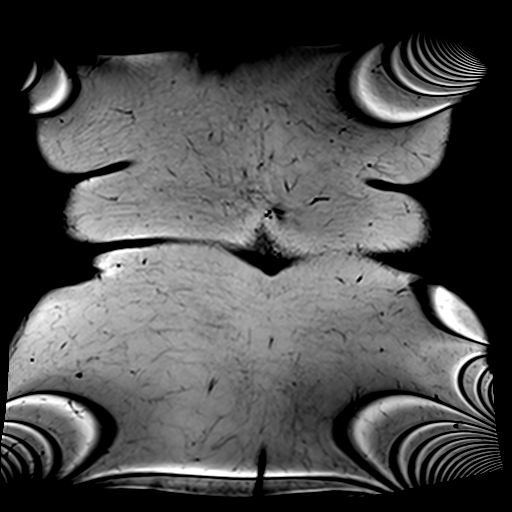
[im 33/33]
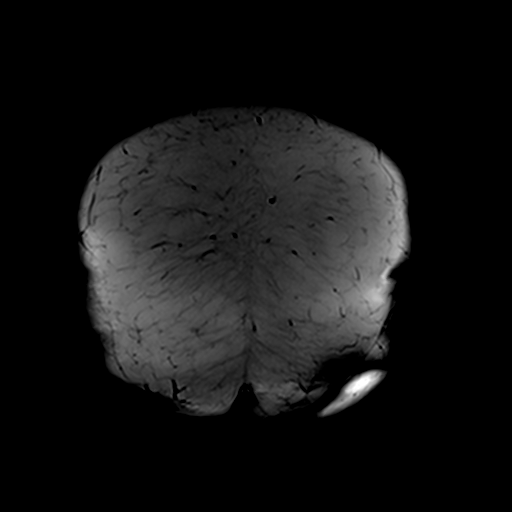

[Series 4: T2 · axial · 6.5mm · 1.19mm/px · z∈[-12,+230]mm · 2 of 32 slices shown]
[im 1/32]
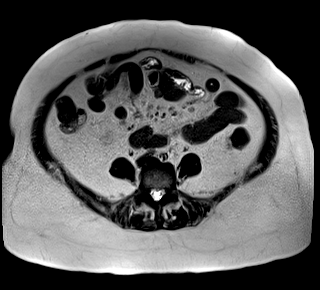
[im 32/32]
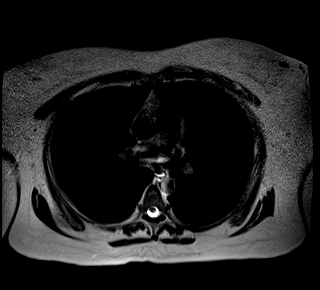

[Series 5: T1 · axial · 6.5mm · 0.74mm/px · 1 of 32 slices shown (1 of 2)]
[im 1/32]
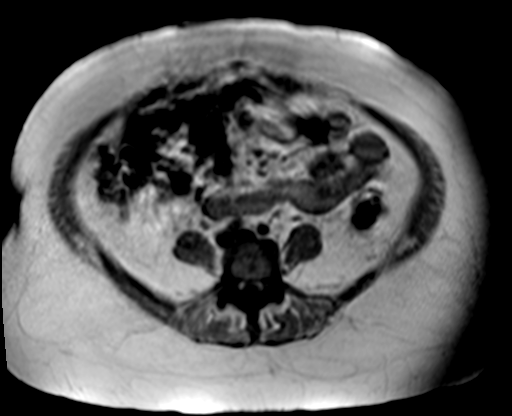

[Series 5: T1 · axial · 6.5mm · 0.74mm/px · 1 of 32 slices shown (2 of 2)]
[im 1/32]
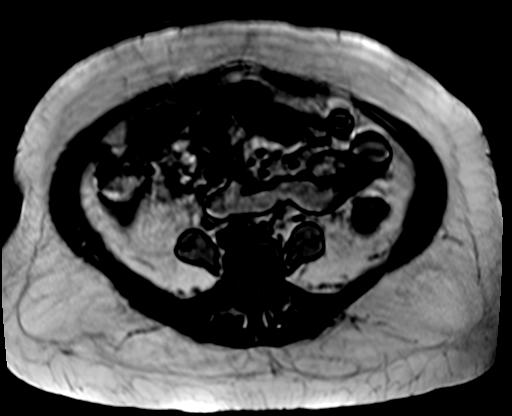

[Series 8: T2 fat-sat · axial · 6.5mm · 1.19mm/px · 1 of 33 slices shown]
[im 1/33]
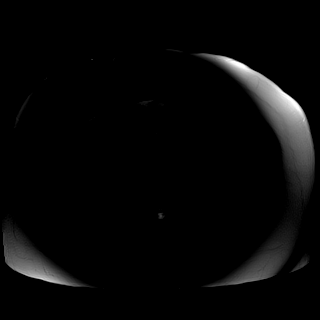

[Series 9: ax dwi_tracew · axial · 6.5mm · 1.42mm/px · 1 of 34 slices shown (1 of 3)]
[im 1/34]
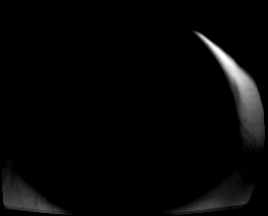

[Series 9: ax dwi_tracew · axial · 6.5mm · 1.42mm/px · 1 of 34 slices shown (2 of 3)]
[im 1/34]
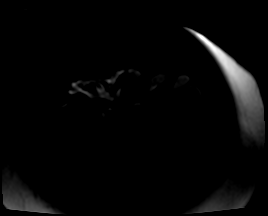

[Series 9: ax dwi_tracew · axial · 6.5mm · 1.42mm/px · 1 of 34 slices shown (3 of 3)]
[im 1/34]
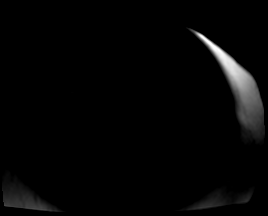

[Series 10: ax dwi_adc · axial · 6.5mm · 1.42mm/px · 1 of 34 slices shown]
[im 1/34]
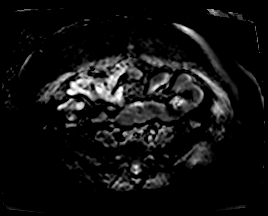

[Series 14: MRCP · coronal · 3.5mm · 1.12mm/px · 1 of 20 slices shown]
[im 1/20]
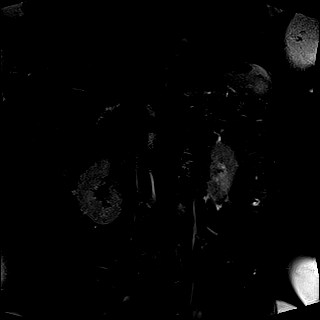

[Series 15: radials · sagittal · 50.0mm · 0.78mm/px · 1 of 5 slices shown]
[im 1/5]
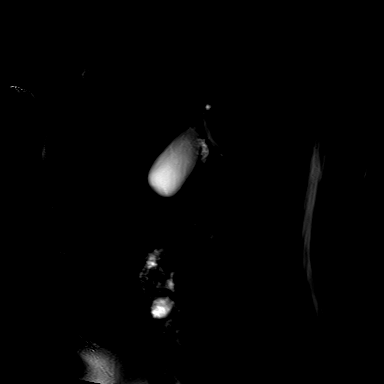

[Series 16: T1 dynamic fat-sat · axial · non-contrast · 3.2mm · 1.19mm/px · z∈[+2,+229]mm · 3 of 72 slices shown (1 of 5)]
[im 1/72]
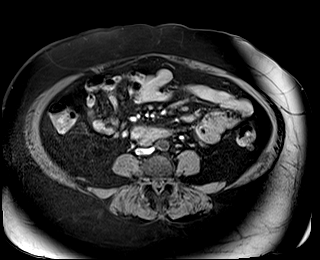
[im 36/72]
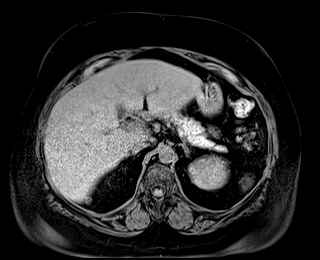
[im 72/72]
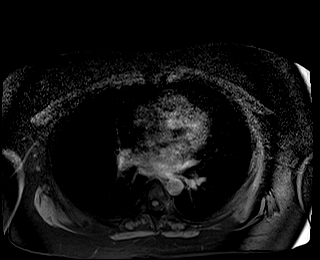

[Series 17: T1 dynamic fat-sat post-contrast · axial · 3.2mm · 1.19mm/px · z∈[+2,+229]mm · 3 of 72 slices shown (1 of 4)]
[im 1/72]
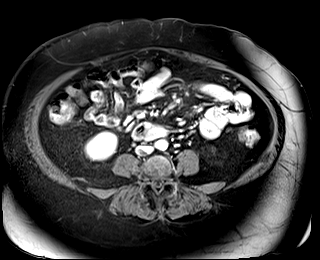
[im 36/72]
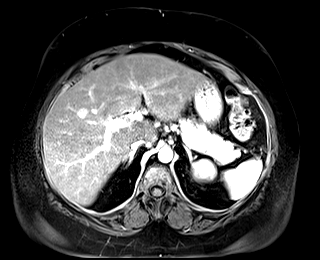
[im 72/72]
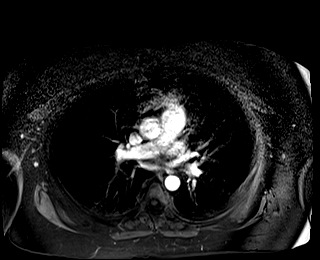

[Series 18: T1 dynamic fat-sat · axial · 3.2mm · 1.19mm/px · z∈[+2,+229]mm · 3 of 72 slices shown (2 of 5)]
[im 1/72]
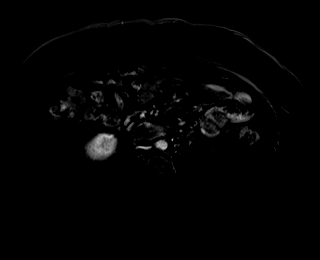
[im 36/72]
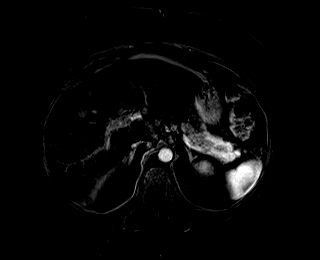
[im 72/72]
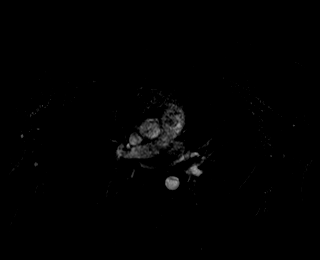

[Series 19: T1 dynamic fat-sat post-contrast · axial · 3.2mm · 1.19mm/px · z∈[+2,+229]mm · 3 of 72 slices shown (2 of 4)]
[im 1/72]
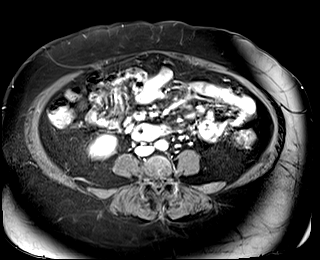
[im 36/72]
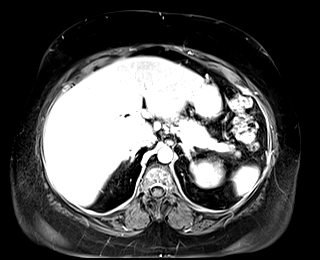
[im 72/72]
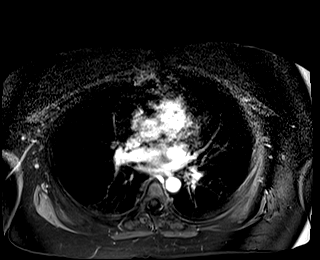

[Series 20: T1 dynamic fat-sat · axial · 3.2mm · 1.19mm/px · z∈[+2,+229]mm · 3 of 72 slices shown (3 of 5)]
[im 1/72]
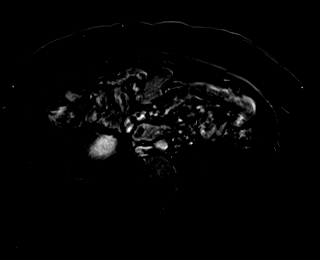
[im 36/72]
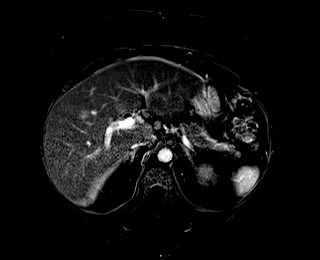
[im 72/72]
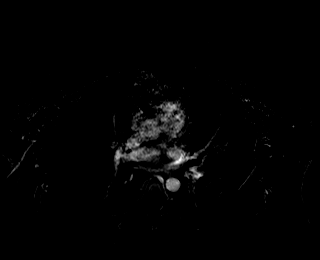

[Series 21: T1 dynamic fat-sat post-contrast · axial · 3.2mm · 1.19mm/px · z∈[+2,+229]mm · 3 of 72 slices shown (3 of 4)]
[im 1/72]
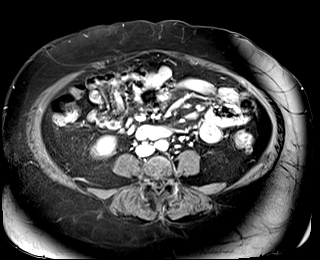
[im 36/72]
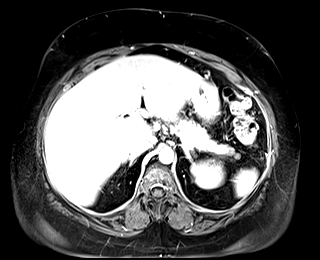
[im 72/72]
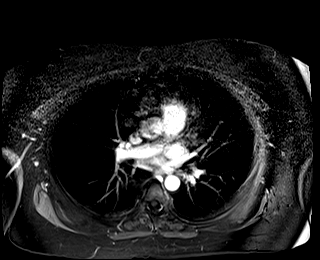

[Series 22: T1 dynamic fat-sat · axial · 3.2mm · 1.19mm/px · z∈[+2,+229]mm · 3 of 72 slices shown (4 of 5)]
[im 1/72]
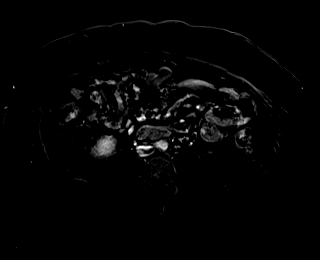
[im 36/72]
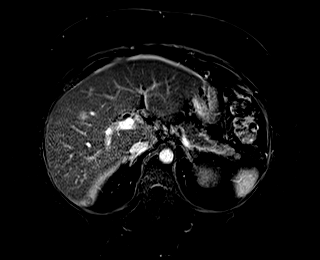
[im 72/72]
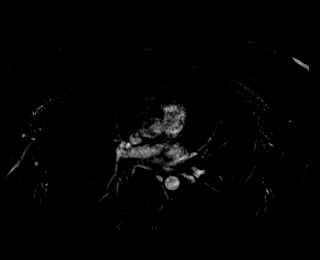

[Series 23: T1 dynamic post-contrast · coronal · 3.2mm · 1.31mm/px · 3 of 80 slices shown]
[im 1/80]
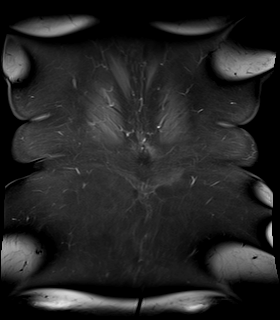
[im 40/80]
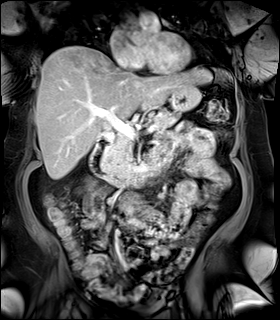
[im 80/80]
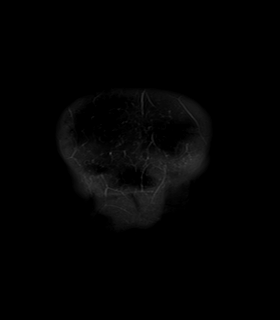

[Series 24: T1 dynamic fat-sat post-contrast · axial · 3.2mm · 1.19mm/px · z∈[+2,+229]mm · 3 of 72 slices shown (4 of 4)]
[im 1/72]
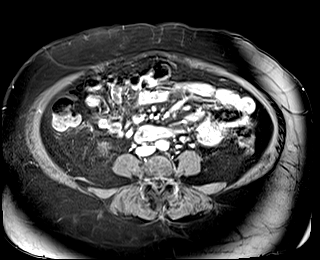
[im 36/72]
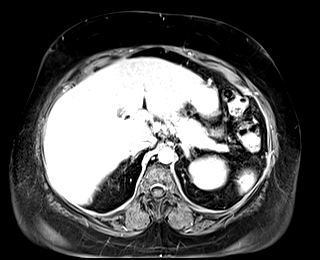
[im 72/72]
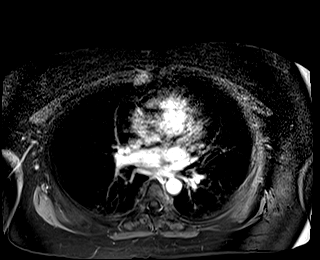

[Series 25: T1 dynamic fat-sat · axial · 3.2mm · 1.19mm/px · z∈[+2,+229]mm · 3 of 72 slices shown (5 of 5)]
[im 1/72]
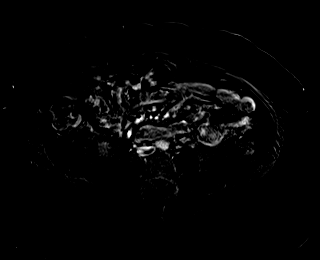
[im 36/72]
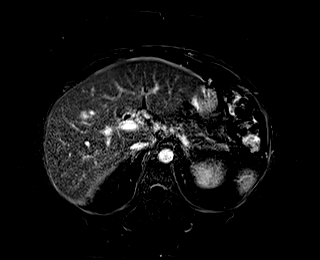
[im 72/72]
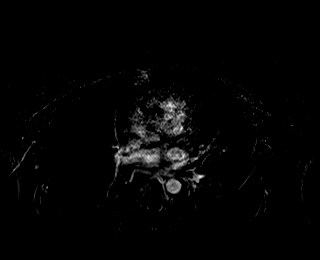

[43 of 48 positions shown; findings below may reference images not displayed]

FINDINGS: Lower chest: Unremarkable.

Hepatobiliary: 2 small hypervascular lesions are identified in the
right liver.

9 mm lesion with mild T2 hyperintensity is identified in the
anterior right liver (see image 37 of series 21). This flash fills
on early arterial phase imaging but was not seen on the previous CT
scan.

12 mm lesion towards the dome of the anterior right liver (24/21)
shows mild increased T2 signal and delayed enhancement. This lesion
was present on the study from 08/28/2017 (image 15/series 2) and is
stable in the interval.

There is no evidence for gallstones, gallbladder wall thickening, or
pericholecystic fluid. No intrahepatic or extrahepatic biliary
dilation.

Pancreas: No focal mass lesion. No dilatation of the main duct. No
intraparenchymal cyst. No peripancreatic edema.

Spleen:  No splenomegaly. No focal mass lesion.

Adrenals/Urinary Tract: No adrenal nodule or mass. Kidneys
unremarkable.

Stomach/Bowel: Stomach is nondistended. No gastric wall thickening.
No evidence of outlet obstruction. Duodenum is normally positioned
as is the ligament of Treitz. No small bowel or colonic dilatation
within the visualized abdomen.

Vascular/Lymphatic: No abdominal aortic aneurysm. Mildly enlarged
lymph nodes in the porta hepatis measure up to 12 mm short axis,
similar to prior CT scan and minimally increased since 07/11/2016..

Other:  No intraperitoneal free fluid.

Musculoskeletal: No abnormal marrow enhancement within the
visualized bony anatomy.
IMPRESSION: 1. No MR findings to explain the patient's history of abdominal pain
with nausea and vomiting.
2. Small hypervascular liver lesions, indeterminate on today's exam.
While likely benign, consider follow-up MRI in 6 months to ensure
stability.
3. No substantial change and borderline to mild lymphadenopathy in
the porta hepatis, likely reactive. These could also be assessed at
the time of follow-up.

## 2020-10-31 ENCOUNTER — Other Ambulatory Visit: Payer: Self-pay | Admitting: Family Medicine

## 2020-10-31 DIAGNOSIS — Z1231 Encounter for screening mammogram for malignant neoplasm of breast: Secondary | ICD-10-CM

## 2020-12-02 ENCOUNTER — Ambulatory Visit
Admission: RE | Admit: 2020-12-02 | Discharge: 2020-12-02 | Disposition: A | Discharge status: Home / Self Care | Payer: Medicaid Other | Source: Ambulatory Visit | Attending: Family Medicine | Admitting: Family Medicine

## 2020-12-02 ENCOUNTER — Other Ambulatory Visit: Payer: Self-pay

## 2020-12-02 DIAGNOSIS — Z1231 Encounter for screening mammogram for malignant neoplasm of breast: Secondary | ICD-10-CM | POA: Diagnosis not present

## 2021-02-25 ENCOUNTER — Ambulatory Visit: Admit: 2021-02-25 | Payer: Medicaid Other | Admitting: Internal Medicine

## 2021-02-25 SURGERY — COLONOSCOPY
Anesthesia: General

## 2021-04-28 ENCOUNTER — Encounter: Payer: Self-pay | Admitting: Internal Medicine

## 2021-04-29 ENCOUNTER — Ambulatory Visit: Payer: Medicaid Other | Admitting: Certified Registered Nurse Anesthetist

## 2021-04-29 ENCOUNTER — Encounter: Payer: Self-pay | Admitting: Internal Medicine

## 2021-04-29 ENCOUNTER — Ambulatory Visit
Admission: RE | Admit: 2021-04-29 | Discharge: 2021-04-29 | Disposition: A | Discharge status: Home / Self Care | Payer: Medicaid Other | Attending: Internal Medicine | Admitting: Internal Medicine

## 2021-04-29 ENCOUNTER — Encounter
Admission: RE | Disposition: A | Discharge status: Home / Self Care | Payer: Self-pay | Source: Home / Self Care | Attending: Internal Medicine

## 2021-04-29 DIAGNOSIS — J449 Chronic obstructive pulmonary disease, unspecified: Secondary | ICD-10-CM | POA: Insufficient documentation

## 2021-04-29 DIAGNOSIS — Z7984 Long term (current) use of oral hypoglycemic drugs: Secondary | ICD-10-CM | POA: Diagnosis not present

## 2021-04-29 DIAGNOSIS — K5090 Crohn's disease, unspecified, without complications: Secondary | ICD-10-CM | POA: Insufficient documentation

## 2021-04-29 DIAGNOSIS — Z1211 Encounter for screening for malignant neoplasm of colon: Secondary | ICD-10-CM | POA: Diagnosis not present

## 2021-04-29 DIAGNOSIS — K5730 Diverticulosis of large intestine without perforation or abscess without bleeding: Secondary | ICD-10-CM | POA: Diagnosis not present

## 2021-04-29 DIAGNOSIS — K760 Fatty (change of) liver, not elsewhere classified: Secondary | ICD-10-CM | POA: Diagnosis not present

## 2021-04-29 DIAGNOSIS — I10 Essential (primary) hypertension: Secondary | ICD-10-CM | POA: Diagnosis not present

## 2021-04-29 DIAGNOSIS — E785 Hyperlipidemia, unspecified: Secondary | ICD-10-CM | POA: Insufficient documentation

## 2021-04-29 DIAGNOSIS — K219 Gastro-esophageal reflux disease without esophagitis: Secondary | ICD-10-CM | POA: Diagnosis not present

## 2021-04-29 DIAGNOSIS — Z86010 Personal history of colonic polyps: Secondary | ICD-10-CM | POA: Insufficient documentation

## 2021-04-29 DIAGNOSIS — E119 Type 2 diabetes mellitus without complications: Secondary | ICD-10-CM | POA: Insufficient documentation

## 2021-04-29 DIAGNOSIS — Z79899 Other long term (current) drug therapy: Secondary | ICD-10-CM | POA: Diagnosis not present

## 2021-04-29 DIAGNOSIS — Z8711 Personal history of peptic ulcer disease: Secondary | ICD-10-CM | POA: Insufficient documentation

## 2021-04-29 DIAGNOSIS — Z87891 Personal history of nicotine dependence: Secondary | ICD-10-CM | POA: Insufficient documentation

## 2021-04-29 DIAGNOSIS — K640 First degree hemorrhoids: Secondary | ICD-10-CM | POA: Insufficient documentation

## 2021-04-29 DIAGNOSIS — M1990 Unspecified osteoarthritis, unspecified site: Secondary | ICD-10-CM | POA: Insufficient documentation

## 2021-04-29 HISTORY — PX: COLONOSCOPY WITH PROPOFOL: SHX5780

## 2021-04-29 SURGERY — COLONOSCOPY WITH PROPOFOL
Anesthesia: General

## 2021-04-29 MED ORDER — PROPOFOL 10 MG/ML IV BOLUS
INTRAVENOUS | Status: DC | PRN
  Administered 2021-04-29: 70 mg via INTRAVENOUS
  Administered 2021-04-29: 30 mg via INTRAVENOUS

## 2021-04-29 MED ORDER — GLYCOPYRROLATE 0.2 MG/ML IJ SOLN
INTRAMUSCULAR | Status: DC | PRN
Start: 2021-04-29 — End: 2021-04-29
  Administered 2021-04-29: .1 mg via INTRAVENOUS

## 2021-04-29 MED ORDER — GLYCOPYRROLATE 0.2 MG/ML IJ SOLN
INTRAMUSCULAR | Status: AC
  Filled 2021-04-29: qty 1

## 2021-04-29 MED ORDER — PROPOFOL 500 MG/50ML IV EMUL
INTRAVENOUS | Status: DC | PRN
  Administered 2021-04-29: 150 ug/kg/min via INTRAVENOUS

## 2021-04-29 MED ORDER — SODIUM CHLORIDE 0.9 % IV SOLN
INTRAVENOUS | Status: DC
  Administered 2021-04-29: 1000 mL via INTRAVENOUS

## 2021-04-29 MED ORDER — LIDOCAINE HCL (CARDIAC) PF 100 MG/5ML IV SOSY
PREFILLED_SYRINGE | INTRAVENOUS | Status: DC | PRN
  Administered 2021-04-29: 50 mg via INTRAVENOUS

## 2021-04-29 MED ORDER — PROPOFOL 500 MG/50ML IV EMUL
INTRAVENOUS | Status: AC
  Filled 2021-04-29: qty 50

## 2021-04-29 MED ORDER — LIDOCAINE HCL (PF) 2 % IJ SOLN
INTRAMUSCULAR | Status: AC
  Filled 2021-04-29: qty 5

## 2021-04-29 NOTE — Anesthesia Preprocedure Evaluation (Signed)
Anesthesia Evaluation  ?Patient identified by MRN, date of birth, ID band ?Patient awake ? ? ? ?Reviewed: ?Allergy & Precautions, NPO status , Patient's Chart, lab work & pertinent test results ? ?History of Anesthesia Complications ?Negative for: history of anesthetic complications ? ?Airway ?Mallampati: II ? ?TM Distance: >3 FB ?Neck ROM: Full ? ? ? Dental ?no notable dental hx. ?(+) Teeth Intact ?  ?Pulmonary ?asthma , neg sleep apnea, COPD, Current Smoker and Patient abstained from smoking., former smoker,  ?  ?Pulmonary exam normal ?breath sounds clear to auscultation ? ? ? ? ? ? Cardiovascular ?Exercise Tolerance: Good ?METShypertension, (-) CAD and (-) Past MI (-) dysrhythmias  ?Rhythm:Regular Rate:Normal ?- Systolic murmurs ? ?  ?Neuro/Psych ?negative neurological ROS ? negative psych ROS  ? GI/Hepatic ?PUD, GERD  ,(+)  ?  ? (-) substance abuse ? ,   ?Endo/Other  ?diabetes ? Renal/GU ?negative Renal ROS  ? ?  ?Musculoskeletal ? ? Abdominal ?  ?Peds ? Hematology ?  ?Anesthesia Other Findings ?Past Medical History: ?No date: Anemia ?No date: Arthritis ?No date: Asthma ?No date: COPD (chronic obstructive pulmonary disease) ([place]) ?No date: DDD (degenerative disc disease), lumbar ?No date: Diabetes mellitus without complication ([place]) ?    Comment:  on metformin ?No date: DJD (degenerative joint disease) ?No date: Dysrhythmia ?No date: Fatty liver ?No date: Fibroids ?No date: GERD (gastroesophageal reflux disease) ?No date: Glaucoma ?No date: Hyperlipidemia ?No date: Hypertension ?No date: IBS (irritable bowel syndrome) ?No date: Lumbago ?No date: Neuromuscular disorder [facility]) ?05/2014: 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[place]) ?No date: Renal disorder ?No date: Ulcerative colitis ([place]) ?    Comment:  on mesalamine ? Reproductive/Obstetrics ? ?  ? ? ? ? ? ? ? ? ? ? ? ? ? ?  ?  ? ? ? ? ? ? ? ? ?Anesthesia Physical ?Anesthesia Plan ? ?ASA: 2 ? ?Anesthesia Plan: General  ? ?Post-op Pain Management:  Minimal or no pain anticipated  ? ?Induction: Intravenous ? ?PONV Risk Score and Plan: 2 and Propofol infusion, TIVA and Ondansetron ? ?Airway Management Planned: Nasal Cannula ? ?Additional Equipment: None ? ?Intra-op Plan:  ? ?Post-operative Plan:  ? ?Informed Consent: I have reviewed the patients History and Physical, chart, labs and discussed the procedure including the risks, benefits and alternatives for the proposed anesthesia with the patient or authorized representative who has indicated his/her understanding and acceptance.  ? ? ? ?Dental advisory given ? ?Plan Discussed with: CRNA and Surgeon ? ?Anesthesia Plan Comments: (Discussed risks of anesthesia with patient, including possibility of difficulty with spontaneous ventilation under anesthesia necessitating airway intervention, PONV, and rare risks such as cardiac or respiratory or neurological events, and allergic reactions. Discussed the role of CRNA in patient's perioperative care. Patient understands.)  ? ? ? ? ? ? ?Anesthesia Quick Evaluation ? ?

## 2021-04-29 NOTE — Transfer of Care (Signed)
Immediate Anesthesia Transfer of Care Note ? ?Patient: Sheila [name] ? ?Procedure(s) Performed: COLONOSCOPY WITH PROPOFOL ? ?Patient Location: Endoscopy Unit ? ?Anesthesia Type:General ? ?Level of Consciousness: awake, alert  and oriented ? ?Airway & Oxygen Therapy: Patient Spontanous Breathing ? ?Post-op Assessment: Report given to RN and Post -op Vital signs reviewed and stable ? ?Post vital signs: Reviewed and stable ? ?Last Vitals:  ?Vitals Value Taken Time  ?BP 128/57 04/29/21 1025  ?Temp    ?Pulse 84 04/29/21 1026  ?Resp 22 04/29/21 1026  ?SpO2 97 % 04/29/21 1026  ?Vitals shown include unvalidated device data. ? ?Last Pain:  ?Vitals:  ? 04/29/21 0859  ?TempSrc: Temporal  ?PainSc: 0-No pain  ?   ? ?  ? ?Complications: No notable events documented. ?

## 2021-04-29 NOTE — H&P (Signed)
Outpatient short stay form Pre-procedure ?04/29/2021 9:15 AM ?Lorie Apley K. Alice Reichert, M.D. ? ?Primary Physician: Tomasa Hose, M.D. ? ?Reason for visit:  Chronic ulcerative colitis, hematochezia ? ?History of present illness:  Ms. Joffe presents to the [facility] for one episode of painless hematochezia which occurred three weeks ago. She called to get an appointment and the soonest she could get in was this morning. She reports about three weeks ago she had urge to have a bowel movement and when she went all she saw was dark red blood that was very liquidy/watery. There was no pain associated with this and there was no clots seen. There has been no recurrence of hematochezia. She feels like a lot of her symptoms were brought on by severe emotional distress. She reports about one month ago she got some devastating news that her life partner of 33 years had metastatic pancreatic cancer with mets to the liver. This has been a total shock to her life and she is still understandably grieving. The funeral is scheduled for this weekend. She has remained compliant with her Lialda 2.4 grams daily. She denies any overt diarrhea, tenesmus, abdominal pain/cramping, or rectal pain. [name] having 1-2 formed BMs daily. No nocturnal stooling. She denies any fevers, chills, nausea, or vomiting. She had colonoscopy originally scheduled for later this month, but rescheduled to April given all of the grief she is currently dealing with. She denies any fatigue, myalgias, arthralgias, or skin lesions. She does have 1-3 alcoholic beverages per night. No recent hospitalizations or ED visits.   ? ? ? ?Current Facility-Administered Medications:  ?  0.9 %  sodium chloride infusion, , Intravenous, Continuous, [place], Benay Pike, MD, Last Rate: 20 mL/hr at 04/29/21 0912, 1,000 mL at 04/29/21 0912 ? ?Medications Prior to Admission  ?Medication Sig Dispense Refill Last Dose  ? acetaminophen (TYLENOL) 500 MG tablet Take 1 tablet (500 mg total)  by mouth every 12 (twelve) hours. 30 tablet 0 Past Week  ? allopurinol (ZYLOPRIM) 100 MG tablet Take 100 mg by mouth daily.  3 04/28/2021  ? ascorbic acid (VITAMIN C) 500 MG tablet Take 1 tablet (500 mg total) by mouth daily. 30 tablet 1 Past Week  ? bimatoprost (LUMIGAN) 0.03 % ophthalmic solution Place 1 drop into both eyes at bedtime.   04/28/2021  ? brinzolamide (AZOPT) 1 % ophthalmic suspension Place 1 drop into both eyes 2 (two) times daily.   04/28/2021  ? chlorthalidone (HYGROTON) 25 MG tablet Take 25 mg by mouth daily.   3 04/29/2021 at 0545  ? [facility] VITAMIN B12 1000 MCG tablet Take 1,000 mcg by mouth daily.  3 Past Week  ? dorzolamide (TRUSOPT) 2 % ophthalmic solution 1 drop 3 (three) times daily.   04/28/2021  ? folic acid (FOLVITE) 1 MG tablet Take 1 tablet (1 mg total) by mouth daily.   Past Week  ? gabapentin (NEURONTIN) 300 MG capsule Take 1 capsule (300 mg total) by mouth 3 (three) times daily. (Patient taking differently: Take 300 mg by mouth 2 (two) times daily.) 90 capsule 0 04/28/2021  ? HYDROcodone-acetaminophen (NORCO) 10-325 MG tablet Take 1 tablet by mouth every 6 (six) hours as needed.   04/28/2021  ? KLOR-CON M20 20 MEQ tablet Take 2 tablets (40 mEq total) by mouth daily. 2 tablet 0 04/28/2021  ? latanoprost (XALATAN) 0.005 % ophthalmic solution Place 1 drop into both eyes at bedtime.   04/28/2021  ? linagliptin (TRADJENTA) 5 MG TABS tablet Take 1 tablet (5  mg total) by mouth daily. 30 tablet 0 04/28/2021  ? losartan (COZAAR) 25 MG tablet Take 25 mg by mouth daily.    04/28/2021  ? mesalamine (LIALDA) 1.2 g EC tablet Take 2.4 g by mouth daily with breakfast.   04/28/2021  ? metFORMIN (GLUCOPHAGE-XR) 500 MG 24 hr tablet Take 500 mg by mouth 2 (two) times daily.   04/28/2021  ? methocarbamol (ROBAXIN) 500 MG tablet Take 1-2 tablets (500-1,000 mg total) by mouth every 8 (eight) hours as needed for muscle spasms. 60 tablet 0 04/28/2021  ? metoprolol succinate (TOPROL-XL) 25 MG 24 hr tablet Take 25 mg by mouth  daily.   04/28/2021  ? metoprolol succinate (TOPROL-XL) 50 MG 24 hr tablet Take 50 mg by mouth at bedtime.   04/28/2021  ? Multiple Vitamin (MULTIVITAMIN WITH MINERALS) TABS tablet Take 1 tablet by mouth daily.   Past Week  ? pantoprazole (PROTONIX) 40 MG tablet Take 40 mg by mouth every morning.   04/28/2021  ? pravastatin (PRAVACHOL) 10 MG tablet Take 10 mg by mouth every morning.    04/28/2021  ? sucralfate (CARAFATE) 1 g tablet Take 1 g by mouth 2 (two) times daily.   04/28/2021  ? thiamine 100 MG tablet Take 1 tablet (100 mg total) by mouth daily.   Past Week  ? albuterol (PROVENTIL HFA;VENTOLIN HFA) 108 (90 BASE) MCG/ACT inhaler Inhale 2 puffs into the lungs every 4 (four) hours as needed for wheezing or shortness of breath.    at prn  ? docusate sodium (COLACE) 100 MG capsule Take 1 capsule (100 mg total) by mouth 2 (two) times daily. 10 capsule 0  at prn  ? enoxaparin (LOVENOX) 40 MG/0.4ML injection Inject 0.4 mLs (40 mg total) into the skin daily for 21 days. 8.4 mL 0   ? oxyCODONE-acetaminophen (PERCOCET/ROXICET) 5-325 MG tablet Take 1-2 tablets by mouth every 6 (six) hours as needed for moderate pain. (Patient not taking: Reported on 04/29/2021) 50 tablet 0 Not Taking  ? ? ? ?Allergies  ?Allergen Reactions  ? Ampicillin Anaphylaxis  ? Erythromycin Anaphylaxis  ? Omeprazole Anaphylaxis  ? Penicillins Anaphylaxis and Other (See Comments)  ?  Has patient had a PCN reaction causing immediate rash, facial/tongue/throat swelling, SOB or lightheadedness with hypotension: Yes ?Has patient had a PCN reaction causing severe rash involving mucus membranes or skin necrosis: No ?Has patient had a PCN reaction that required hospitalization: Unknown ?Has patient had a PCN reaction occurring within the last 10 years: Yes ?If all of the above answers are "NO", then may proceed with Cephalosporin use. ?  ? Triamterene-Hctz Shortness Of Breath and Swelling  ? Quinapril Cough  ? ? ? ?Past Medical History:  ?Diagnosis Date  ?  Anemia   ? Arthritis   ? Asthma   ? COPD (chronic obstructive pulmonary disease) ([place])   ? DDD (degenerative disc disease), lumbar   ? Diabetes mellitus without complication ([place])   ? on metformin  ? DJD (degenerative joint disease)   ? Dysrhythmia   ? Fatty liver   ? Fibroids   ? GERD (gastroesophageal reflux disease)   ? Glaucoma   ? Hyperlipidemia   ? Hypertension   ? IBS (irritable bowel syndrome)   ? Lumbago   ? Neuromuscular disorder ([place])   ? 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[place]) 85/8850  ? Renal disorder   ? Ulcerative colitis ([place])   ? on mesalamine  ? ? ?Review of systems:  Otherwise negative.  ? ? ?Physical Exam ? ?  Gen: Alert, oriented. Appears stated age.  ?HEENT: [place]/AT. PERRLA. ?Lungs: CTA, no wheezes. ?CV: RR nl S1, S2. ?Abd: soft, benign, no masses. BS+ ?Ext: No edema. Pulses 2+ ? ? ? ?Planned procedures: Proceed with colonoscopy. The patient understands the nature of the planned procedure, indications, risks, alternatives and potential complications including but not limited to bleeding, infection, perforation, damage to internal organs and possible oversedation/side effects from anesthesia. The patient agrees and gives consent to proceed.  ?Please refer to procedure notes for findings, recommendations and patient disposition/instructions.  ? ? ? ?Wei Newbrough K. Alice Reichert, M.D. ?Gastroenterology ?04/29/2021  9:15 AM ? ? ? ? ? ? ?

## 2021-04-29 NOTE — Anesthesia Postprocedure Evaluation (Signed)
Anesthesia Post Note ? ?Patient: Sheila [name] ? ?Procedure(s) Performed: COLONOSCOPY WITH PROPOFOL ? ?Patient location during evaluation: Endoscopy ?Anesthesia Type: General ?Level of consciousness: awake and alert ?Pain management: pain level controlled ?Vital Signs Assessment: post-procedure vital signs reviewed and stable ?Respiratory status: spontaneous breathing, nonlabored ventilation, respiratory function stable and patient connected to nasal cannula oxygen ?Cardiovascular status: blood pressure returned to baseline and stable ?Postop Assessment: no apparent nausea or vomiting ?Anesthetic complications: no ? ? ?No notable events documented. ? ? ?Last Vitals:  ?Vitals:  ? 04/29/21 0859 04/29/21 1021  ?BP: (!) 154/124 (!) 128/57  ?Pulse: 98   ?Resp: 18   ?Temp: (!) 36.1 ?C (!) 35.8 ?C  ?SpO2: 98%   ?  ?Last Pain:  ?Vitals:  ? 04/29/21 1041  ?TempSrc:   ?PainSc: 0-No pain  ? ? ?  ?  ?  ?  ?  ?  ? ?Arita Miss ? ? ? ? ?

## 2021-04-29 NOTE — Anesthesia Procedure Notes (Signed)
Procedure Name: [place] ?Date/Time: 04/29/2021 9:46 AM ?Performed by: Tollie Eth, CRNA ?Pre-anesthesia Checklist: Patient identified, Emergency Drugs available, Suction available and Patient being monitored ?Patient Re-evaluated:Patient Re-evaluated prior to induction ?Oxygen Delivery Method: Nasal cannula ?Induction Type: IV induction ?Placement Confirmation: positive ETCO2 ? ? ? ? ?

## 2021-04-29 NOTE — Interval H&P Note (Signed)
History and Physical Interval Note: ? ?04/29/2021 ?9:17 AM ? ?[name]  has presented today for surgery, with the diagnosis of K51.90- Chronic ulcerative colitis, without complications  ?Z86.010  - Hx of adenomatous colonic polyps.  The various methods of treatment have been discussed with the patient and family. After consideration of risks, benefits and other options for treatment, the patient has consented to  Procedure(s) with comments: ?COLONOSCOPY WITH PROPOFOL (N/A) - DM as a surgical intervention.  The patient's history has been reviewed, patient examined, no change in status, stable for surgery.  I have reviewed the patient's chart and labs.  Questions were answered to the patient's satisfaction.   ? ? ?Sheila [name], Sheila [name] ? ? ?

## 2021-04-29 NOTE — Interval H&P Note (Signed)
History and Physical Interval Note: ? ?04/29/2021 ?9:42 AM ? ?Sheila [name]  has presented today for surgery, with the diagnosis of K51.90- Chronic ulcerative colitis, without complications  ?Z86.010  - Hx of adenomatous colonic polyps.  The various methods of treatment have been discussed with the patient and family. After consideration of risks, benefits and other options for treatment, the patient has consented to  Procedure(s) with comments: ?COLONOSCOPY WITH PROPOFOL (N/A) - DM as a surgical intervention.  The patient's history has been reviewed, patient examined, no change in status, stable for surgery.  I have reviewed the patient's chart and labs.  Questions were answered to the patient's satisfaction.   ? ? ?[place], [place] ? ? ?

## 2021-04-29 NOTE — Op Note (Signed)
[facility] ?Gastroenterology ?Patient Name: Sheila [name] ?Procedure Date: 04/29/2021 9:42 AM ?MRN: [id number] ?Account #: [id number] ?Date of Birth: 1960/09/11 ?Admit Type: Outpatient ?Age: 61 ?Room: [facility] ENDO ROOM 2 ?Gender: Female ?Note Status: Finalized ?Instrument Name: Colonoscope [id number] ?Procedure:             Colonoscopy ?Indications:           Surveillance: Personal history of adenomatous polyps  ?                       on last colonoscopy > 5 years ago ?Providers:             Sheila [name]. Sheila Barthel MD, MD ?Medicines:             Propofol per Anesthesia ?Complications:         No immediate complications. Estimated blood loss:  ?                       Minimal. ?Procedure:             Pre-Anesthesia Assessment: ?                       - The risks and benefits of the procedure and the  ?                       sedation options and risks were discussed with the  ?                       patient. All questions were answered and informed  ?                       consent was obtained. ?                       - Patient identification and proposed procedure were  ?                       verified prior to the procedure by the nurse. The  ?                       procedure was verified in the procedure room. ?                       - ASA Grade Assessment: III - A patient with severe  ?                       systemic disease. ?                       - After reviewing the risks and benefits, the patient  ?                       was deemed in satisfactory condition to undergo the  ?                       procedure. ?                       After obtaining informed consent, the colonoscope was  ?  passed under direct vision. Throughout the procedure,  ?                       the patient's blood pressure, pulse, and oxygen  ?                       saturations were monitored continuously. The  ?                       Colonoscope was introduced through the anus and  ?                        advanced to the the cecum, identified by appendiceal  ?                       orifice and ileocecal valve. The colonoscopy was  ?                       performed without difficulty. The patient tolerated  ?                       the procedure well. The quality of the bowel  ?                       preparation was adequate. The ileocecal valve,  ?                       appendiceal orifice, and rectum were photographed. ?Findings: ?     The perianal and digital rectal examinations were normal. Pertinent  ?     negatives include normal sphincter tone and no palpable rectal lesions. ?     Non-bleeding internal hemorrhoids were found during retroflexion. The  ?     hemorrhoids were Grade I (internal hemorrhoids that do not prolapse). ?     Many small-mouthed diverticula were found in the entire [name]. There was  ?     no evidence of diverticular bleeding. ?     Normal mucosa was found in the entire [name]. Two biopsies were taken  ?     every 10 cm with a cold forceps from the right [name], left [name] and  ?     rectum for dysplasia surveillance. These biopsy specimens were sent to  ?     Pathology. Estimated blood loss was minimal. ?     There is no endoscopic evidence of inflammation, mass or polyps in the  ?     entire [name]. ?Impression:            - Non-bleeding internal hemorrhoids. ?                       - Mild diverticulosis in the entire examined [name].  ?                       There was no evidence of diverticular bleeding. ?                       - Normal mucosa in the entire examined [name]. Biopsied. ?Recommendation:        - Patient has a contact number available for  ?  emergencies. The signs and symptoms of potential  ?                       delayed complications were discussed with the patient.  ?                       Return to normal activities tomorrow. Written  ?                       discharge instructions were provided to the patient. ?                       - Resume previous  diet. ?                       - Continue present medications. ?                       - Await pathology results. ?                       - Repeat colonoscopy in 3 years for surveillance. ?                       - Return to GI office in 6 months. ?                       - The findings and recommendations were discussed with  ?                       the patient. ?Procedure Code(s):     --- Professional --- ?                       [phone], Colonoscopy, flexible; with biopsy, single or  ?                       multiple ?Diagnosis Code(s):     --- Professional --- ?                       K57.30, Diverticulosis of large intestine without  ?                       perforation or abscess without bleeding ?                       K64.0, First degree hemorrhoids ?                       Z86.010, Personal history of colonic polyps ?CPT copyright 2019 American Medical Association. All rights reserved. ?The codes documented in this report are preliminary and upon coder review may  ?be revised to meet current compliance requirements. ?Sheila Sella MD, MD ?04/29/2021 10:16:20 AM ?This report has been signed electronically. ?Number of Addenda: 0 ?Note Initiated On: 04/29/2021 9:42 AM ?Scope Withdrawal Time: 0 hours 8 minutes 29 seconds  ?Total Procedure Duration: 0 hours 13 minutes 10 seconds  ?Estimated Blood Loss:  Estimated blood loss was minimal. Estimated blood loss  ?                       was minimal. ?  [facility] ?

## 2021-04-30 ENCOUNTER — Encounter: Payer: Self-pay | Admitting: Internal Medicine

## 2021-04-30 LAB — SURGICAL PATHOLOGY

## 2021-08-06 ENCOUNTER — Other Ambulatory Visit: Payer: Self-pay | Admitting: Family Medicine

## 2021-08-06 DIAGNOSIS — R221 Localized swelling, mass and lump, neck: Secondary | ICD-10-CM

## 2021-08-17 ENCOUNTER — Other Ambulatory Visit: Payer: Medicaid Other

## 2021-08-24 ENCOUNTER — Ambulatory Visit: Payer: Medicaid Other

## 2021-09-01 ENCOUNTER — Ambulatory Visit
Admission: RE | Admit: 2021-09-01 | Discharge: 2021-09-01 | Disposition: A | Discharge status: Home / Self Care | Payer: Medicaid Other | Source: Ambulatory Visit | Attending: Family Medicine | Admitting: Family Medicine

## 2021-09-01 DIAGNOSIS — R221 Localized swelling, mass and lump, neck: Secondary | ICD-10-CM | POA: Diagnosis present

## 2021-09-24 ENCOUNTER — Emergency Department
Admission: EM | Admit: 2021-09-24 | Discharge: 2021-09-24 | Disposition: A | Discharge status: Home / Self Care | Payer: Medicaid Other | Attending: Emergency Medicine | Admitting: Emergency Medicine

## 2021-09-24 ENCOUNTER — Emergency Department: Payer: Medicaid Other

## 2021-09-24 ENCOUNTER — Ambulatory Visit: Payer: Medicaid Other | Admitting: Surgery

## 2021-09-24 ENCOUNTER — Other Ambulatory Visit: Payer: Self-pay

## 2021-09-24 DIAGNOSIS — M5431 Sciatica, right side: Secondary | ICD-10-CM

## 2021-09-24 DIAGNOSIS — I10 Essential (primary) hypertension: Secondary | ICD-10-CM | POA: Insufficient documentation

## 2021-09-24 DIAGNOSIS — M5441 Lumbago with sciatica, right side: Secondary | ICD-10-CM | POA: Diagnosis not present

## 2021-09-24 DIAGNOSIS — E119 Type 2 diabetes mellitus without complications: Secondary | ICD-10-CM | POA: Diagnosis not present

## 2021-09-24 DIAGNOSIS — M5137 Other intervertebral disc degeneration, lumbosacral region: Secondary | ICD-10-CM | POA: Diagnosis not present

## 2021-09-24 DIAGNOSIS — Z96651 Presence of right artificial knee joint: Secondary | ICD-10-CM | POA: Insufficient documentation

## 2021-09-24 DIAGNOSIS — M7989 Other specified soft tissue disorders: Secondary | ICD-10-CM | POA: Diagnosis present

## 2021-09-24 LAB — CBC WITH DIFFERENTIAL/PLATELET
Abs Immature Granulocytes: 0.05 10*3/uL (ref 0.00–0.07)
Basophils Absolute: 0.1 10*3/uL (ref 0.0–0.1)
Basophils Relative: 1 %
Eosinophils Absolute: 0.2 10*3/uL (ref 0.0–0.5)
Eosinophils Relative: 2 %
HCT: 44.4 % (ref 36.0–46.0)
Hemoglobin: 13.8 g/dL (ref 12.0–15.0)
Immature Granulocytes: 1 %
Lymphocytes Relative: 35 %
Lymphs Abs: 3.2 10*3/uL (ref 0.7–4.0)
MCH: 26.2 pg (ref 26.0–34.0)
MCHC: 31.1 g/dL (ref 30.0–36.0)
MCV: 84.3 fL (ref 80.0–100.0)
Monocytes Absolute: 0.9 10*3/uL (ref 0.1–1.0)
Monocytes Relative: 10 %
Neutro Abs: 4.8 10*3/uL (ref 1.7–7.7)
Neutrophils Relative %: 51 %
Platelets: 273 10*3/uL (ref 150–400)
RBC: 5.27 MIL/uL — ABNORMAL HIGH (ref 3.87–5.11)
RDW: 14.8 % (ref 11.5–15.5)
WBC: 9.3 10*3/uL (ref 4.0–10.5)
nRBC: 0 % (ref 0.0–0.2)

## 2021-09-24 LAB — COMPREHENSIVE METABOLIC PANEL
ALT: 21 U/L (ref 0–44)
AST: 26 U/L (ref 15–41)
Albumin: 4.2 g/dL (ref 3.5–5.0)
Alkaline Phosphatase: 57 U/L (ref 38–126)
Anion gap: 9 (ref 5–15)
BUN: 11 mg/dL (ref 6–20)
CO2: 28 mmol/L (ref 22–32)
Calcium: 9.2 mg/dL (ref 8.9–10.3)
Chloride: 104 mmol/L (ref 98–111)
Creatinine, Ser: 0.65 mg/dL (ref 0.44–1.00)
GFR, Estimated: >60 mL/min (ref >60)
Glucose, Bld: 143 mg/dL — ABNORMAL HIGH (ref 70–99)
Potassium: 3.7 mmol/L (ref 3.5–5.1)
Sodium: 141 mmol/L (ref 135–145)
Total Bilirubin: 0.9 mg/dL (ref 0.3–1.2)
Total Protein: 7.3 g/dL (ref 6.5–8.1)

## 2021-09-24 MED ORDER — ONDANSETRON 4 MG PO TBDP
4.0000 mg | ORAL_TABLET | Freq: Once | ORAL | Status: AC
  Administered 2021-09-24: 4 mg via ORAL
  Filled 2021-09-24: qty 1

## 2021-09-24 MED ORDER — CYCLOBENZAPRINE HCL 5 MG PO TABS: 5.0000 mg | ORAL_TABLET | Freq: Three times a day (TID) | ORAL | 0 refills | Status: DC | PRN

## 2021-09-24 MED ORDER — DICLOFENAC SODIUM 75 MG PO TBEC: 75.0000 mg | DELAYED_RELEASE_TABLET | Freq: Two times a day (BID) | ORAL | 0 refills | Status: AC

## 2021-09-24 MED ORDER — KETOROLAC TROMETHAMINE 60 MG/2ML IM SOLN
60.0000 mg | Freq: Once | INTRAMUSCULAR | Status: AC
  Administered 2021-09-24: 60 mg via INTRAMUSCULAR
  Filled 2021-09-24: qty 2

## 2021-09-24 MED ORDER — OXYCODONE-ACETAMINOPHEN 5-325 MG PO TABS
1.0000 | ORAL_TABLET | Freq: Once | ORAL | Status: AC
  Administered 2021-09-24: 1 via ORAL
  Filled 2021-09-24: qty 1

## 2021-09-24 NOTE — ED Provider Notes (Signed)
[facility] Emergency Department Provider Note     Event Date/Time   First MD Initiated Contact with Patient 09/24/21 1226     (approximate)   History   sciatic pain   HPI  Sheila [name] is a 61 y.o. female with a history of hypokalemia, diabetes, HTN, HLD, and osteoarthritis presents to the ED for swelling to her right leg and calf.  She denies any current swelling but does endorse pain from the right buttocks down the leg.  She denies any recent injury, trauma, fall.  She also denies any cough or shortness of breath but she presents to the ED from local urgent care for further evaluation of her symptoms.  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     Physical Exam   Triage Vital Signs: ED Triage Vitals  Enc Vitals Group     BP 09/24/21 1139 (!) 162/74     Pulse Rate 09/24/21 1139 (!) 57     Resp 09/24/21 1139 18     Temp 09/24/21 1139 98 F (36.7 C)     Temp Source 09/24/21 1139 Oral     SpO2 09/24/21 1139 95 %     Weight 09/24/21 1140 161 lb (73 kg)     Height 09/24/21 1140 5' (1.524 m)     Head Circumference --      Peak Flow --      Pain Score 09/24/21 1139 10     Pain Loc --      Pain Edu? --      Excl. in [place]? --     Most recent vital signs: Vitals:   09/24/21 1139 09/24/21 1530  BP: (!) 162/74 (!) 150/70  Pulse: (!) 57 60  Resp: 18 18  Temp: 98 F (36.7 C)   SpO2: 95% 95%    General Awake, no distress. NAD CV:  Good peripheral perfusion.  No CCE distally.  No peripheral edema noted. RESP:  Normal effort.  ABD:  No distention.  MSK:  Normal spinal alignment without midline tenderness, spasm, homely, or step-off.  Patient with full active range of motion of the right lower extremity on exam.   ED Results / Procedures / Treatments   Labs (all labs ordered are listed, but only abnormal results are displayed) Labs Reviewed  CBC WITH DIFFERENTIAL/PLATELET - Abnormal; Notable for the following  components:      Result Value   RBC 5.27 (*)    All other components within normal limits  COMPREHENSIVE METABOLIC PANEL - Abnormal; Notable for the following components:   Glucose, Bld 143 (*)    All other components within normal limits   EKG   RADIOLOGY  I personally viewed and evaluated these images as part of my medical decision making, as well as reviewing the written report by the radiologist.  ED Provider Interpretation: no acute findings  DG Lumbar Spine Complete  Result Date: 09/24/2021 CLINICAL DATA:  RLE radicular symptoms EXAM: LUMBAR SPINE - COMPLETE 4+ VIEW COMPARISON:  None Available. FINDINGS: There are 5 non-rib-bearing lumbar vertebrae. No evidence of acute lumbar spine fracture. There is moderate degenerative disc disease at L5-S1 with trace retrolisthesis at this level. Mild degenerative endplate changes elsewhere. There is moderate probable calcified uterine fibroid. Tubal ligation clips noted. Calcification overlies the right upper quadrant, potentially a gallstone. IMPRESSION: Moderate disc height loss at L5-S1 with trace retrolisthesis at this level. Moderate lower lumbar predominant facet arthropathy. No  evidence of lumbar spine fracture. Electronically Signed   By: Maurine Simmering M.D.   On: 09/24/2021 14:23   US Venous Img Lower Unilateral Right  Result Date: 09/24/2021 CLINICAL DATA:  Calf swelling, leg pain EXAM: RIGHT LOWER EXTREMITY VENOUS DOPPLER ULTRASOUND TECHNIQUE: Gray-scale sonography with compression, as well as color and duplex ultrasound, were performed to evaluate the deep venous system(s) from the level of the common femoral vein through the popliteal and proximal calf veins. COMPARISON:  02/15/2018 FINDINGS: VENOUS Normal compressibility of the common femoral, superficial femoral, and popliteal veins, as well as the visualized calf veins. Visualized portions of profunda femoral vein and great saphenous vein unremarkable. No filling defects to suggest DVT  on grayscale or color Doppler imaging. Doppler waveforms show normal direction of venous flow, normal respiratory plasticity and response to augmentation. Limited views of the contralateral common femoral vein are unremarkable. OTHER None. Limitations: none IMPRESSION: Negative. Electronically Signed   By: Lucrezia Europe M.D.   On: 09/24/2021 13:44     PROCEDURES:  Critical Care performed: No  Procedures   MEDICATIONS ORDERED IN ED: Medications  ondansetron (ZOFRAN-ODT) disintegrating tablet 4 mg (4 mg Oral Given 09/24/21 1247)  oxyCODONE-acetaminophen (PERCOCET/ROXICET) 5-325 MG per tablet 1 tablet (1 tablet Oral Given 09/24/21 1248)  ketorolac (TORADOL) injection 60 mg (60 mg Intramuscular Given 09/24/21 1506)     IMPRESSION / MDM / [place] / ED COURSE  I reviewed the triage vital signs and the nursing notes.                              Differential diagnosis includes, but is not limited to, lumbar radiculopathy, sciatica, DVT, myalgia  Patient's presentation is most consistent with acute complicated illness / injury requiring diagnostic workup.  Patient to the ED for evaluation of persistent right lower extremity pain, with initial concern for DVT.  She reports some intermittent lower extremity edema.  Symptoms today include pain and disability.  No edema on exam.  Patient with reassuring ultrasound without evidence of acute DVT.  X-ray imaging of the lumbar spine however, does reveal some significant DJD and spondylolisthesis at L5-S1.  This likely represents the patient symptomology which is likely consistent with sciatica.  Patient reports some improvement of her pain after medication administration in the ED.  Patient's diagnosis is consistent with sciatica. Patient will be discharged home with prescriptions for benzopyrene and diclofenac. Patient is to follow up with primary provider as needed or otherwise directed. Patient is given ED precautions to return to the ED for any  worsening or new symptoms.     FINAL CLINICAL IMPRESSION(S) / ED DIAGNOSES   Final diagnoses:  Sciatica of right side  DDD (degenerative disc disease), lumbosacral     Rx / DC Orders   ED Discharge Orders          Ordered    cyclobenzaprine (FLEXERIL) 5 MG tablet  3 times daily PRN        09/24/21 1502    diclofenac (VOLTAREN) 75 MG EC tablet  2 times daily        09/24/21 1502             Note:  This document was prepared using Dragon voice recognition software and may include unintentional dictation errors.    Melvenia Needles, PA-C 09/24/21 1949    Blake Divine, MD 09/25/21 [phone]

## 2021-09-24 NOTE — Discharge Instructions (Signed)
Your exam, x-ray, and ultrasound overall reassuring.  No sign of a DVT or blood clot in the right leg.  You do have evidence of arthritis in the lumbar spine.  This is probably causing some nerve irritation, giving you sciatica nerve pain on the right leg.  Take the prescription medicines as directed.  Follow-up with your primary provider for ongoing symptoms.

## 2021-09-24 NOTE — ED Triage Notes (Signed)
Pt sent from [place], pt states she has had swelling in her right leg, and there was tenderness when it was swollen, but now she does not have any swelling. Pt states her chief complaint is pain that runs down her buttocks on the right side and down to her right leg. No swelling noted on either leg at this time, pt denies tenderness on either leg. No SOB noted.

## 2021-10-01 ENCOUNTER — Encounter: Payer: Self-pay | Admitting: Surgery

## 2021-10-01 ENCOUNTER — Ambulatory Visit (INDEPENDENT_AMBULATORY_CARE_PROVIDER_SITE_OTHER): Payer: Medicaid Other | Admitting: Surgery

## 2021-10-01 VITALS — BP 148/84 | HR 80 | Temp 98.2°F | Ht 60.0 in | Wt 158.2 lb

## 2021-10-01 DIAGNOSIS — D171 Benign lipomatous neoplasm of skin and subcutaneous tissue of trunk: Secondary | ICD-10-CM

## 2021-10-01 NOTE — Progress Notes (Signed)
Patient ID: Sheila [name], female   DOB: June 22, 1960, 61 y.o.   MRN: [id number]  Chief Complaint: Lump over vertebral prominence  History of Present Illness Sheila [name] is a 61 y.o. female with a mass there noted about 2 to 3 months ago.  Suspicions that it is grown somewhat bigger in the interim.  Denies any drainage from the area.  No skin changes induration, or erythema.  She has some back/vertebral arthritic issues that may be the underlying source of the discomfort when she is attempting to find a good position of sleep.    Past Medical History Past Medical History:  Diagnosis Date   Anemia    Arthritis    Asthma    COPD (chronic obstructive pulmonary disease) (HCC)    DDD (degenerative disc disease), lumbar    Diabetes mellitus without complication (HCC)    on metformin   DJD (degenerative joint disease)    Dysrhythmia    Fatty liver    Fibroids    GERD (gastroesophageal reflux disease)    Glaucoma    Hyperlipidemia    Hypertension    IBS (irritable bowel syndrome)    Lumbago    Neuromuscular disorder ([place])    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[place]) 81/8563   Renal disorder    Ulcerative colitis ([place])    on mesalamine      Past Surgical History:  Procedure Laterality Date   COLONOSCOPY WITH PROPOFOL N/A 08/19/2015   Procedure: COLONOSCOPY WITH PROPOFOL;  Surgeon: Lollie Sails, MD;  Location: [facility] ENDOSCOPY;  Service: Endoscopy;  Laterality: N/A;   COLONOSCOPY WITH PROPOFOL N/A 12/08/2015   Procedure: COLONOSCOPY WITH PROPOFOL;  Surgeon: Lollie Sails, MD;  Location: [facility] ENDOSCOPY;  Service: Endoscopy;  Laterality: N/A;   COLONOSCOPY WITH PROPOFOL N/A 12/09/2015   Procedure: COLONOSCOPY WITH PROPOFOL;  Surgeon: Lollie Sails, MD;  Location: [facility] ENDOSCOPY;  Service: Endoscopy;  Laterality: N/A;   COLONOSCOPY WITH PROPOFOL N/A 04/29/2021   Procedure: COLONOSCOPY WITH PROPOFOL;  Surgeon: Toledo, Benay Pike, MD;  Location: [facility] ENDOSCOPY;  Service:  Gastroenterology;  Laterality: N/A;  DM   ESOPHAGOGASTRODUODENOSCOPY (EGD) WITH PROPOFOL N/A 11/08/2017   Procedure: ESOPHAGOGASTRODUODENOSCOPY (EGD) WITH PROPOFOL;  Surgeon: Lollie Sails, MD;  Location: [facility] ENDOSCOPY;  Service: Endoscopy;  Laterality: N/A;   FRACTURE SURGERY     JOINT REPLACEMENT Left 04/19/2006   JOINT REPLACEMENT Right 01/14/2015   ORIF FEMUR FRACTURE Left 02/05/2019   ORIF FEMUR FRACTURE Left 02/05/2019   Procedure: OPEN REDUCTION INTERNAL FIXATION (ORIF) DISTAL FEMUR FRACTURE;  Surgeon: Altamese , MD;  Location: [place];  Service: Orthopedics;  Laterality: Left;   TOTAL KNEE ARTHROPLASTY Left 2008   TOTAL KNEE ARTHROPLASTY Right 01/14/2015   Procedure: TOTAL KNEE ARTHROPLASTY;  Surgeon: Hessie Knows, MD;  Location: [facility] ORS;  Service: Orthopedics;  Laterality: Right;    Allergies  Allergen Reactions   Ampicillin Anaphylaxis   Erythromycin Anaphylaxis   Omeprazole Anaphylaxis   Penicillins Anaphylaxis and Other (See Comments)    Has patient had a PCN reaction causing immediate rash, facial/tongue/throat swelling, SOB or lightheadedness with hypotension: Yes Has patient had a PCN reaction causing severe rash involving mucus membranes or skin necrosis: No Has patient had a PCN reaction that required hospitalization: Unknown Has patient had a PCN reaction occurring within the last 10 years: Yes If all of the above answers are "NO", then may proceed with Cephalosporin use.    Triamterene-Hctz Shortness Of Breath and Swelling   Quinapril Cough  Current Outpatient Medications  Medication Sig Dispense Refill   acetaminophen (TYLENOL) 500 MG tablet Take 1 tablet (500 mg total) by mouth every 12 (twelve) hours. 30 tablet 0   albuterol (PROVENTIL HFA;VENTOLIN HFA) 108 (90 BASE) MCG/ACT inhaler Inhale 2 puffs into the lungs every 4 (four) hours as needed for wheezing or shortness of breath.     allopurinol (ZYLOPRIM) 100 MG tablet Take 100 mg by mouth daily.  3    ascorbic acid (VITAMIN C) 500 MG tablet Take 1 tablet (500 mg total) by mouth daily. 30 tablet 1   bimatoprost (LUMIGAN) 0.03 % ophthalmic solution Place 1 drop into both eyes at bedtime.     brinzolamide (AZOPT) 1 % ophthalmic suspension Place 1 drop into both eyes 2 (two) times daily.     chlorthalidone (HYGROTON) 25 MG tablet Take 25 mg by mouth daily.   3   [employer] VITAMIN B12 1000 MCG tablet Take 1,000 mcg by mouth daily.  3   cyclobenzaprine (FLEXERIL) 5 MG tablet Take 1 tablet (5 mg total) by mouth 3 (three) times daily as needed. 15 tablet 0   diclofenac (VOLTAREN) 75 MG EC tablet Take 1 tablet (75 mg total) by mouth 2 (two) times daily. 60 tablet 0   docusate sodium (COLACE) 100 MG capsule Take 1 capsule (100 mg total) by mouth 2 (two) times daily. 10 capsule 0   dorzolamide (TRUSOPT) 2 % ophthalmic solution 1 drop 3 (three) times daily.     folic acid (FOLVITE) 1 MG tablet Take 1 tablet (1 mg total) by mouth daily.     gabapentin (NEURONTIN) 300 MG capsule Take 1 capsule (300 mg total) by mouth 3 (three) times daily. (Patient taking differently: Take 300 mg by mouth 2 (two) times daily.) 90 capsule 0   HYDROcodone-acetaminophen (NORCO) 10-325 MG tablet Take 1 tablet by mouth every 6 (six) hours as needed.     KLOR-CON M20 20 MEQ tablet Take 2 tablets (40 mEq total) by mouth daily. 2 tablet 0   latanoprost (XALATAN) 0.005 % ophthalmic solution Place 1 drop into both eyes at bedtime.     linagliptin (TRADJENTA) 5 MG TABS tablet Take 1 tablet (5 mg total) by mouth daily. 30 tablet 0   losartan (COZAAR) 25 MG tablet Take 25 mg by mouth daily.      mesalamine (LIALDA) 1.2 g EC tablet Take 2.4 g by mouth daily with breakfast.     metFORMIN (GLUCOPHAGE-XR) 500 MG 24 hr tablet Take 500 mg by mouth 2 (two) times daily.     metoprolol succinate (TOPROL-XL) 25 MG 24 hr tablet Take 25 mg by mouth daily.     metoprolol succinate (TOPROL-XL) 50 MG 24 hr tablet Take 50 mg by mouth at bedtime.      Multiple Vitamin (MULTIVITAMIN WITH MINERALS) TABS tablet Take 1 tablet by mouth daily.     pantoprazole (PROTONIX) 40 MG tablet Take 40 mg by mouth every morning.     pravastatin (PRAVACHOL) 10 MG tablet Take 10 mg by mouth every morning.      sucralfate (CARAFATE) 1 g tablet Take 1 g by mouth 2 (two) times daily.     thiamine 100 MG tablet Take 1 tablet (100 mg total) by mouth daily.     enoxaparin (LOVENOX) 40 MG/0.4ML injection Inject 0.4 mLs (40 mg total) into the skin daily for 21 days. 8.4 mL 0   No current facility-administered medications for this visit.    Family History Family History  Problem Relation Age of Onset   Hypertension Mother    Diabetes Father    Hypertension Father    Diabetes Brother    Hypertension Brother    Breast cancer Neg Hx       Social History Social History   Tobacco Use   Smoking status: Former    Types: Cigarettes    Quit date: 01/04/1978    Years since quitting: 43.7   Smokeless tobacco: Never  Vaping Use   Vaping Use: Never used  Substance Use Topics   Alcohol use: Yes    Comment: Recovering alcoholic since 28/3662   Drug use: No    Comment: used cocaine, quit in 2003        Review of Systems  Constitutional: Negative.   HENT: Negative.    Eyes: Negative.   Respiratory: Negative.    Cardiovascular: Negative.   Gastrointestinal: Negative.   Genitourinary: Negative.   Skin: Negative.   Neurological: Negative.   Psychiatric/Behavioral: Negative.       Physical Exam Blood pressure (!) 148/84, pulse 80, temperature 98.2 F (36.8 C), temperature source Oral, height 5' (1.524 m), weight 158 lb 3.2 oz (71.8 kg), SpO2 96 %. Last Weight  Most recent update: 10/01/2021  1:34 PM    Weight  71.8 kg (158 lb 3.2 oz)             CONSTITUTIONAL: Well developed, and nourished, appropriately responsive and aware without distress.  Ambulates with a cane EYES: Sclera non-icteric.   EARS, NOSE, MOUTH AND THROAT:  The oropharynx is  clear. Oral mucosa is pink and moist.    Hearing is intact to voice.  NECK: Trachea is midline, and there is no jugular venous distension.  LYMPH NODES:  Lymph nodes in the neck are not enlarged. RESPIRATORY: Normal respiratory effort without pathologic use of accessory muscles. CARDIOVASCULAR: Heart is regular in rate. GI: The abdomen is soft, nontender, and nondistended.    SKIN: Skin turgor is normal. No pathologic skin lesions appreciated.   Smooth 3 cm subcutaneous thickening of the fat pad overlying the vertebral prominence noted.  No remarkable tenderness, no distinct perimeter.  Feels mobile from the underlying deep fascia when her neck is erect.  When she extends her neck, things tighten up in this area which is expected.  I do not believe the underlying fat pad is creating any additional pain. NEUROLOGIC:  Motor and sensation appear grossly normal.  Cranial nerves are grossly without defect. PSYCH:  Alert and oriented to person, place and time. Affect is appropriate for situation.  Data Reviewed I have personally reviewed what is currently available of the patient's imaging, recent labs and medical records.   Labs:     Latest Ref Rng & Units 09/24/2021   11:45 AM 02/07/2019    4:25 AM 02/06/2019    3:40 AM  CBC  WBC 4.0 - 10.5 K/uL 9.3  14.1  12.6   Hemoglobin 12.0 - 15.0 g/dL 13.8  8.3  9.2   Hematocrit 36.0 - 46.0 % 44.4  26.2  29.2   Platelets 150 - 400 K/uL 273  200  194       Latest Ref Rng & Units 09/24/2021   11:45 AM 02/07/2019    4:25 AM 02/06/2019    3:40 AM  CMP  Glucose 70 - 99 mg/dL 143  156  140   BUN 6 - 20 mg/dL 11  14  14    Creatinine 0.44 - 1.00 mg/dL 0.65  0.69  0.78   Sodium 135 - 145 mmol/L 141  140  137   Potassium 3.5 - 5.1 mmol/L 3.7  3.2  3.5   Chloride 98 - 111 mmol/L 104  100  98   CO2 22 - 32 mmol/L 28  30  29    Calcium 8.9 - 10.3 mg/dL 9.2  8.0  7.9   Total Protein 6.5 - 8.1 g/dL 7.3     Total Bilirubin 0.3 - 1.2 mg/dL 0.9     Alkaline Phos 38 -  126 U/L 57     AST 15 - 41 U/L 26     ALT 0 - 44 U/L 21         Imaging: Radiology review:  CLINICAL DATA:  Palpable mass at base of cervical spine   EXAM: ULTRASOUND OF HEAD/NECK SOFT TISSUES   TECHNIQUE: Ultrasound examination of the head and neck soft tissues was performed in the area of clinical concern.   COMPARISON:  None Available.   FINDINGS: Nondescript ovoid mass, horizontally oriented and showing mid level echoes in the subcutaneous fat at the symptomatic level. Dimensions measure 2.8 by 1.3 x 0.7 cm.   IMPRESSION: Palpable complaint correlates with a 3 x 1 cm subcutaneous mass. No diagnostic imaging features but appearance would be compatible with a lipoma. Recommend clinical follow-up for stability.     Electronically Signed   By: Jorje Guild M.D.   On: 09/02/2021 08:18 Within last 24 hrs: No results found.  Assessment    Lipomatous mass over vertebral prominence, doubt symptomatic. Patient Active Problem List   Diagnosis Date Noted   Closed displaced supracondylar fracture of distal end of left femur without intracondylar extension ([place]) 02/04/2019   Femur fracture ([place]) 02/04/2019   Leukocytosis 02/04/2019   Alcohol use 02/04/2019   Right leg weakness 02/14/2018   Elevated bilirubin 02/14/2018   SIRS (systemic inflammatory response syndrome) ([place]) 01/30/2018   Hypoglycemia 01/30/2018   Lactic acidosis 01/30/2018   Alcohol abuse 01/30/2018   Abdominal pain, chronic, epigastric    Pancreatitis 08/29/2017   Diabetes ([place]) 08/29/2017   HTN (hypertension) 08/29/2017   HLD (hyperlipidemia) 08/29/2017   GERD (gastroesophageal reflux disease) 08/29/2017   Hypokalemia 08/29/2017   Hypocalcemia 08/29/2017   Primary osteoarthritis of right knee 01/14/2015    Plan    We discussed her options of excision versus continued observation.  Both she and I believe her daughter accompanying her have opted to continue to watch the area, considering things  could be worse with scarring, infection or wound. Remain readily available to help this lady should she desire to pursue further evaluation/excision of this likely benign lipomatous lesion.  Face-to-face time spent with the patient and accompanying care providers(if present) was 20 minutes, with more than 50% of the time spent counseling, educating, and coordinating care of the patient.    These notes generated with voice recognition software. I apologize for typographical errors.  Ronny Bacon M.D., FACS 10/01/2021, 2:00 PM

## 2021-10-01 NOTE — Patient Instructions (Signed)
   Follow-up with our office as needed.  Please call and ask to speak with a nurse if you develop questions or concerns.  

## 2021-10-26 ENCOUNTER — Other Ambulatory Visit: Payer: Self-pay | Admitting: Family Medicine

## 2021-10-26 DIAGNOSIS — Z1231 Encounter for screening mammogram for malignant neoplasm of breast: Secondary | ICD-10-CM

## 2021-12-03 ENCOUNTER — Ambulatory Visit
Admission: RE | Admit: 2021-12-03 | Discharge: 2021-12-03 | Disposition: A | Discharge status: Home / Self Care | Payer: Medicaid Other | Source: Ambulatory Visit | Attending: Family Medicine | Admitting: Family Medicine

## 2021-12-03 DIAGNOSIS — Z1231 Encounter for screening mammogram for malignant neoplasm of breast: Secondary | ICD-10-CM | POA: Diagnosis not present

## 2022-03-11 ENCOUNTER — Other Ambulatory Visit: Payer: Self-pay | Admitting: *Deleted

## 2022-03-11 DIAGNOSIS — Z122 Encounter for screening for malignant neoplasm of respiratory organs: Secondary | ICD-10-CM

## 2022-03-11 DIAGNOSIS — Z87891 Personal history of nicotine dependence: Secondary | ICD-10-CM

## 2022-03-11 DIAGNOSIS — F17210 Nicotine dependence, cigarettes, uncomplicated: Secondary | ICD-10-CM

## 2022-04-07 ENCOUNTER — Ambulatory Visit (INDEPENDENT_AMBULATORY_CARE_PROVIDER_SITE_OTHER): Payer: Medicaid Other | Admitting: Acute Care

## 2022-04-07 ENCOUNTER — Encounter: Payer: Self-pay | Admitting: Acute Care

## 2022-04-07 DIAGNOSIS — F17210 Nicotine dependence, cigarettes, uncomplicated: Secondary | ICD-10-CM | POA: Diagnosis not present

## 2022-04-07 NOTE — Patient Instructions (Signed)
Thank you for participating in the [facility] Scre[facility]. It was our pleasure to meet you today. We will call you with the results of your scan within the next few days. Your scan will be assigned a Lung RADS category score by the physicians reading the scans.  This Lung RADS score determines follow up scanning.  See below for description of categories, and follow up screening recommendations. We will be in touch to schedule your follow up screening annually or based on recommendations of our providers. We will fax a copy of your scan results to your Primary Care Physician, or the physician who referred you to the program, to ensure they have the results. Please call the office if you have any questions or concerns regarding your scanning experience or results.  Our office number is [phone]. Please speak with Denise Phelps, RN. , or  Denise Buckner RN, They are  our Lung Cancer Screening RN.'s If They are unavailable when you call, Please leave a message on the voice mail. We will return your call at our earliest convenience.This voice mail is monitored several times a day.  Remember, if your scan is normal, we will scan you annually as long as you continue to meet the criteria for the program. (Age 50-80, Current smoker or smoker who has quit within the last 15 years). If you are a smoker, remember, quitting is the single most powerful action that you can take to decrease your risk of lung cancer and other pulmonary, breathing related problems. We know quitting is hard, and we are here to help.  Please let us know if there is anything we can do to help you meet your goal of quitting. If you are a former smoker, congratulations. We are proud of you! Remain smoke free! Remember you can refer friends or family members through the number above.  We will screen them to make sure they meet criteria for the program. Thank you for helping us take better care of you by  participating in Lung Screening.  You can receive free nicotine replacement therapy ( patches, gum or mints) by calling 1-800-QUIT NOW. Please call so we can get you on the path to becoming  a non-smoker. I know it is hard, but you can do this!  Lung RADS Categories:  Lung RADS 1: no nodules or definitely non-concerning nodules.  Recommendation is for a repeat annual scan in 12 months.  Lung RADS 2:  nodules that are non-concerning in appearance and behavior with a very low likelihood of becoming an active cancer. Recommendation is for a repeat annual scan in 12 months.  Lung RADS 3: nodules that are probably non-concerning , includes nodules with a low likelihood of becoming an active cancer.  Recommendation is for a 6-month repeat screening scan. Often noted after an upper respiratory illness. We will be in touch to make sure you have no questions, and to schedule your 6-month scan.  Lung RADS 4 A: nodules with concerning findings, recommendation is most often for a follow up scan in 3 months or additional testing based on our provider's assessment of the scan. We will be in touch to make sure you have no questions and to schedule the recommended 3 month follow up scan.  Lung RADS 4 B:  indicates findings that are concerning. We will be in touch with you to schedule additional diagnostic testing based on our provider's  assessment of the scan.  Other options for assistance in smoking cessation (   As covered by your insurance benefits)  Hypnosis for smoking cessation  [facility] [employer] [phone]  Acupuncture for smoking cessation  [facility] [phone]   

## 2022-04-07 NOTE — Progress Notes (Signed)
Virtual Visit via Telephone Note  I connected with Sheila [name] on 04/07/22 at  9:00 AM EDT by telephone and verified that I am speaking with the correct person using two identifiers.  Location: Patient:  At work Provider: [place], [place], [place], [address]    I discussed the limitations, risks, security and privacy concerns of performing an evaluation and management service by telephone and the availability of in person appointments. I also discussed with the patient that there may be a patient responsible charge related to this service. The patient expressed understanding and agreed to proceed.    Shared Decision Making Visit Lung Cancer Screening Program [phone])   Eligibility: Age 62 y.o. Pack Years Smoking History Calculation 21 pack years (# packs/per year x # years smoked) Recent History of coughing up blood  no Unexplained weight loss? no ( >Than 15 pounds within the last 6 months ) Prior History Lung / other cancer no (Diagnosis within the last 5 years already requiring surveillance chest CT Scans). Smoking Status Current Smoker Former Smokers: Years since quit:  NA  Quit Date:  NA  Visit Components: Discussion included one or more decision making aids. yes Discussion included risk/benefits of screening. yes Discussion included potential follow up diagnostic testing for abnormal scans. yes Discussion included meaning and risk of over diagnosis. yes Discussion included meaning and risk of False Positives. yes Discussion included meaning of total radiation exposure. yes  Counseling Included: Importance of adherence to annual lung cancer LDCT screening. yes Impact of comorbidities on ability to participate in the program. yes Ability and willingness to under diagnostic treatment. yes  Smoking Cessation Counseling: Current Smokers:  Discussed importance of smoking cessation. yes Information about tobacco cessation classes and interventions provided  to patient. yes Patient provided with "ticket" for LDCT Scan. yes Symptomatic Patient. no  Counseling NA Diagnosis Code: Tobacco Use Z72.0 Asymptomatic Patient yes  Counseling (Intermediate counseling: > three minutes counseling) [id number] Former Smokers:  Discussed the importance of maintaining cigarette abstinence. yes Diagnosis Code: Personal History of Nicotine Dependence. [id number] Information about tobacco cessation classes and interventions provided to patient. Yes Patient provided with "ticket" for LDCT Scan. yes Written Order for Lung Cancer Screening with LDCT placed in Epic. Yes (CT Chest Lung Cancer Screening Low Dose W/O CM) [id number] Z12.2-Screening of respiratory organs Z87.891-Personal history of nicotine dependence  I have spent 25 minutes of face to face/ virtual visit   time with Sheila [name] discussing the risks and benefits of lung cancer screening. We viewed / discussed a power point together that explained in detail the above noted topics. We paused at intervals to allow for questions to be asked and answered to ensure understanding.We discussed that the single most powerful action that she can take to decrease her risk of developing lung cancer is to quit smoking. We discussed whether or not she is ready to commit to setting a quit date. We discussed options for tools to aid in quitting smoking including nicotine replacement therapy, non-nicotine medications, support groups, Quit Smart classes, and behavior modification. We discussed that often times setting smaller, more achievable goals, such as eliminating 1 cigarette a day for a week and then 2 cigarettes a day for a week can be helpful in slowly decreasing the number of cigarettes smoked. This allows for a sense of accomplishment as well as providing a clinical benefit. I provided  her  with smoking cessation  information  with contact information for community resources, classes, free nicotine  replacement therapy, and access to  mobile apps, text messaging, and on-line smoking cessation help. I have also provided  her  the office contact information in the event she needs to contact me, or the screening staff. We discussed the time and location of the scan, and that either Doroteo Glassman RN, Joella Prince, RN  or I will call / send a letter with the results within 24-72 hours of receiving them. The patient verbalized understanding of all of  the above and had no further questions upon leaving the office. They have my contact information in the event they have any further questions.  I spent 3 minutes counseling on smoking cessation and the health risks of continued tobacco abuse.  I explained to the patient that there has been a high incidence of coronary artery disease noted on these exams. I explained that this is a non-gated exam therefore degree or severity cannot be determined. This patient is on statin therapy. I have asked the patient to follow-up with their PCP regarding any incidental finding of coronary artery disease and management with diet or medication as their PCP  feels is clinically indicated. The patient verbalized understanding of the above and had no further questions upon completion of the visit.      Magdalen Spatz, NP 04/07/2022

## 2022-04-09 ENCOUNTER — Ambulatory Visit
Admission: RE | Admit: 2022-04-09 | Discharge: 2022-04-09 | Disposition: A | Discharge status: Home / Self Care | Payer: Medicaid Other | Source: Ambulatory Visit | Attending: Acute Care | Admitting: Acute Care

## 2022-04-09 DIAGNOSIS — Z87891 Personal history of nicotine dependence: Secondary | ICD-10-CM | POA: Insufficient documentation

## 2022-04-09 DIAGNOSIS — Z122 Encounter for screening for malignant neoplasm of respiratory organs: Secondary | ICD-10-CM | POA: Insufficient documentation

## 2022-04-09 DIAGNOSIS — F17210 Nicotine dependence, cigarettes, uncomplicated: Secondary | ICD-10-CM | POA: Diagnosis not present

## 2022-04-12 ENCOUNTER — Other Ambulatory Visit: Payer: Self-pay

## 2022-04-12 DIAGNOSIS — Z122 Encounter for screening for malignant neoplasm of respiratory organs: Secondary | ICD-10-CM

## 2022-04-12 DIAGNOSIS — F17210 Nicotine dependence, cigarettes, uncomplicated: Secondary | ICD-10-CM

## 2022-04-12 DIAGNOSIS — Z87891 Personal history of nicotine dependence: Secondary | ICD-10-CM

## 2022-04-19 ENCOUNTER — Telehealth: Payer: Self-pay | Admitting: *Deleted

## 2022-04-19 NOTE — Telephone Encounter (Signed)
Wants CT results for Cancer Screening. Pls call PT @ [phone]

## 2022-04-19 NOTE — Telephone Encounter (Signed)
Patient is wanting CT results from cancer screening if ready  Thank you please advise

## 2022-04-20 NOTE — Telephone Encounter (Signed)
Spoke with patient by phone to review results of LDCT.  She had been sent a letter through [facility] and requested review of findings.  Lung Rads1 with no suspicious findings for lung cancer.  Atherosclerosis and emphysema noted.  Patient takes stain medication. Mild fatty liver noted.  Patient states was aware of this finding from previous abdominal imaging.  Results faxed to PCP.  Order placed for 1 year LDCT.  Patient had no questions.

## 2022-06-14 ENCOUNTER — Other Ambulatory Visit: Payer: Self-pay | Admitting: Family Medicine

## 2022-06-17 ENCOUNTER — Telehealth: Payer: Self-pay

## 2022-06-17 NOTE — Telephone Encounter (Signed)
PT is a pt of [facility] . Per pt  she thinks referral was sent to [place] in error . PT has appt in Aug with [facility] -GI

## 2022-07-13 ENCOUNTER — Ambulatory Visit: Payer: Medicaid Other | Admitting: Surgery

## 2022-08-09 NOTE — Progress Notes (Unsigned)
Patient ID: Sheila [name], female   DOB: October 13, 1960, 62 y.o.   MRN: [id number]  Chief Complaint: Feels lump over vertebral prominence is getting larger.   History of Present Illness *** Previously 10/01/2021 Sheila [name] is a 62 y.o. female with a mass there noted about 2 to 3 months ago.  Suspicions that it is grown somewhat bigger in the interim.  Denies any drainage from the area.  No skin changes induration, or erythema.  She has some back/vertebral arthritic issues that may be the underlying source of the discomfort when she is attempting to find a good position of sleep.    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   Renal disorder    Ulcerative colitis (HCC)    on mesalamine      Past Surgical History:  Procedure Laterality Date   COLONOSCOPY WITH PROPOFOL N/A 08/19/2015   Procedure: COLONOSCOPY WITH PROPOFOL;  Surgeon: Christena Deem, MD;  Location: [facility] ENDOSCOPY;  Service: Endoscopy;  Laterality: N/A;   COLONOSCOPY WITH PROPOFOL N/A 12/08/2015   Procedure: COLONOSCOPY WITH PROPOFOL;  Surgeon: Christena Deem, MD;  Location: [facility] ENDOSCOPY;  Service: Endoscopy;  Laterality: N/A;   COLONOSCOPY WITH PROPOFOL N/A 12/09/2015   Procedure: COLONOSCOPY WITH PROPOFOL;  Surgeon: Christena Deem, MD;  Location: [facility] ENDOSCOPY;  Service: Endoscopy;  Laterality: N/A;   COLONOSCOPY WITH PROPOFOL N/A 04/29/2021   Procedure: COLONOSCOPY WITH PROPOFOL;  Surgeon: Toledo, Boykin Nearing, MD;   Location: [facility] ENDOSCOPY;  Service: Gastroenterology;  Laterality: N/A;  DM   ESOPHAGOGASTRODUODENOSCOPY (EGD) WITH PROPOFOL N/A 11/08/2017   Procedure: ESOPHAGOGASTRODUODENOSCOPY (EGD) WITH PROPOFOL;  Surgeon: Christena Deem, MD;  Location: [facility] ENDOSCOPY;  Service: Endoscopy;  Laterality: N/A;   FRACTURE SURGERY     JOINT REPLACEMENT Left 04/19/2006   JOINT REPLACEMENT Right 01/14/2015   ORIF FEMUR FRACTURE Left 02/05/2019   ORIF FEMUR FRACTURE Left 02/05/2019   Procedure: OPEN REDUCTION INTERNAL FIXATION (ORIF) DISTAL FEMUR FRACTURE;  Surgeon: Myrene Galas, MD;  Location: [facility] OR;  Service: Orthopedics;  Laterality: Left;   TOTAL KNEE ARTHROPLASTY Left 2008   TOTAL KNEE ARTHROPLASTY Right 01/14/2015   Procedure: TOTAL KNEE ARTHROPLASTY;  Surgeon: Kennedy Bucker, MD;  Location: [facility] ORS;  Service: Orthopedics;  Laterality: Right;    Allergies  Allergen Reactions   Ampicillin Anaphylaxis   Erythromycin Anaphylaxis   Omeprazole Anaphylaxis   Penicillins Anaphylaxis and Other (See Comments)    Has patient had a PCN reaction causing immediate rash, facial/tongue/throat swelling, SOB or lightheadedness with hypotension: Yes Has patient had a PCN reaction causing severe rash involving mucus membranes or skin necrosis: No Has patient had a PCN reaction that required hospitalization: Unknown Has patient had a PCN reaction occurring within the last 10 years: Yes If all of the above answers are "NO", then may proceed with Cephalosporin use.    Triamterene-Hctz Shortness Of Breath and  Swelling   Quinapril Cough    Current Outpatient Medications  Medication Sig Dispense Refill   acetaminophen (TYLENOL) 500 MG tablet Take 1 tablet (500 mg total) by mouth every 12 (twelve) hours. 30 tablet 0   albuterol (PROVENTIL HFA;VENTOLIN HFA) 108 (90 BASE) MCG/ACT inhaler Inhale 2 puffs into the lungs every 4 (four) hours as needed for wheezing or shortness of breath.     allopurinol (ZYLOPRIM) 100 MG  tablet Take 100 mg by mouth daily.  3   ascorbic acid (VITAMIN C) 500 MG tablet Take 1 tablet (500 mg total) by mouth daily. 30 tablet 1   bimatoprost (LUMIGAN) 0.03 % ophthalmic solution Place 1 drop into both eyes at bedtime.     brinzolamide (AZOPT) 1 % ophthalmic suspension Place 1 drop into both eyes 2 (two) times daily.     chlorthalidone (HYGROTON) 25 MG tablet Take 25 mg by mouth daily.   3   [employer] VITAMIN B12 1000 MCG tablet Take 1,000 mcg by mouth daily.  3   cyclobenzaprine (FLEXERIL) 5 MG tablet Take 1 tablet (5 mg total) by mouth 3 (three) times daily as needed. 15 tablet 0   docusate sodium (COLACE) 100 MG capsule Take 1 capsule (100 mg total) by mouth 2 (two) times daily. 10 capsule 0   dorzolamide (TRUSOPT) 2 % ophthalmic solution 1 drop 3 (three) times daily.     enoxaparin (LOVENOX) 40 MG/0.4ML injection Inject 0.4 mLs (40 mg total) into the skin daily for 21 days. 8.4 mL 0   folic acid (FOLVITE) 1 MG tablet Take 1 tablet (1 mg total) by mouth daily.     gabapentin (NEURONTIN) 300 MG capsule Take 1 capsule (300 mg total) by mouth 3 (three) times daily. (Patient taking differently: Take 300 mg by mouth 2 (two) times daily.) 90 capsule 0   HYDROcodone-acetaminophen (NORCO) 10-325 MG tablet Take 1 tablet by mouth every 6 (six) hours as needed.     KLOR-CON M20 20 MEQ tablet Take 2 tablets (40 mEq total) by mouth daily. 2 tablet 0   latanoprost (XALATAN) 0.005 % ophthalmic solution Place 1 drop into both eyes at bedtime.     linagliptin (TRADJENTA) 5 MG TABS tablet Take 1 tablet (5 mg total) by mouth daily. 30 tablet 0   losartan (COZAAR) 25 MG tablet Take 25 mg by mouth daily.      mesalamine (LIALDA) 1.2 g EC tablet Take 2.4 g by mouth daily with breakfast.     metFORMIN (GLUCOPHAGE-XR) 500 MG 24 hr tablet Take 500 mg by mouth 2 (two) times daily.     metoprolol succinate (TOPROL-XL) 25 MG 24 hr tablet Take 25 mg by mouth daily.     metoprolol succinate (TOPROL-XL) 50 MG 24 hr  tablet Take 50 mg by mouth at bedtime.     Multiple Vitamin (MULTIVITAMIN WITH MINERALS) TABS tablet Take 1 tablet by mouth daily.     pantoprazole (PROTONIX) 40 MG tablet Take 40 mg by mouth every morning.     pravastatin (PRAVACHOL) 10 MG tablet Take 10 mg by mouth every morning.      sucralfate (CARAFATE) 1 g tablet Take 1 g by mouth 2 (two) times daily.     thiamine 100 MG tablet Take 1 tablet (100 mg total) by mouth daily.     No current facility-administered medications for this visit.    Family History Family History  Problem Relation Age of Onset   Hypertension Mother    Diabetes Father  Hypertension Father    Diabetes Brother    Hypertension Brother    Breast cancer Neg Hx       Social History Social History   Tobacco Use   Smoking status: Every Day    Current packs/day: 0.00    Average packs/day: 0.5 packs/day for 42.0 years (21.0 ttl pk-yrs)    Types: Cigarettes    Start date: 01/05/1936    Last attempt to quit: 01/04/1978    Years since quitting: 44.6   Smokeless tobacco: Never  Vaping Use   Vaping status: Never Used  Substance Use Topics   Alcohol use: Yes    Comment: Recovering alcoholic since 05/2014   Drug use: No    Comment: used cocaine, quit in 2003        Review of Systems  Constitutional: Negative.   HENT: Negative.    Eyes: Negative.   Respiratory: Negative.    Cardiovascular: Negative.   Gastrointestinal: Negative.   Genitourinary: Negative.   Skin: Negative.   Neurological: Negative.   Psychiatric/Behavioral: Negative.       Physical Exam There were no vitals taken for this visit.    CONSTITUTIONAL: Well developed, and nourished, appropriately responsive and aware without distress.  Ambulates with a cane EYES: Sclera non-icteric.   EARS, NOSE, MOUTH AND THROAT:  The oropharynx is clear. Oral mucosa is pink and moist.    Hearing is intact to voice.  NECK: Trachea is midline, and there is no jugular venous distension.  LYMPH  NODES:  Lymph nodes in the neck are not enlarged. RESPIRATORY: Normal respiratory effort without pathologic use of accessory muscles. CARDIOVASCULAR: Heart is regular in rate. GI: The abdomen is soft, nontender, and nondistended.     SKIN: Skin turgor is normal. No pathologic skin lesions appreciated.  *** Smooth 3 cm subcutaneous thickening of the fat pad overlying the vertebral prominence noted.  No remarkable tenderness, no distinct perimeter.  Feels mobile from the underlying deep fascia when her neck is erect.  When she extends her neck, things tighten up in this area which is expected.  I do not believe the underlying fat pad is creating any additional pain.  NEUROLOGIC:  Motor and sensation appear grossly normal.  Cranial nerves are grossly without defect. PSYCH:  Alert and oriented to person, place and time. Affect is appropriate for situation.  Data Reviewed I have personally reviewed what is currently available of the patient's imaging, recent labs and medical records.   Labs:     Latest Ref Rng & Units 09/24/2021   11:45 AM 02/07/2019    4:25 AM 02/06/2019    3:40 AM  CBC  WBC 4.0 - 10.5 K/uL 9.3  14.1  12.6   Hemoglobin 12.0 - 15.0 g/dL 16.1  8.3  9.2   Hematocrit 36.0 - 46.0 % 44.4  26.2  29.2   Platelets 150 - 400 K/uL 273  200  194       Latest Ref Rng & Units 09/24/2021   11:45 AM 02/07/2019    4:25 AM 02/06/2019    3:40 AM  CMP  Glucose 70 - 99 mg/dL 096  045  409   BUN 6 - 20 mg/dL 11  14  14    Creatinine 0.44 - 1.00 mg/dL 8.11  9.14  7.82   Sodium 135 - 145 mmol/L 141  140  137   Potassium 3.5 - 5.1 mmol/L 3.7  3.2  3.5   Chloride 98 - 111 mmol/L 104  100  98   CO2 22 - 32 mmol/L 28  30  29    Calcium 8.9 - 10.3 mg/dL 9.2  8.0  7.9   Total Protein 6.5 - 8.1 g/dL 7.3     Total Bilirubin 0.3 - 1.2 mg/dL 0.9     Alkaline Phos 38 - 126 U/L 57     AST 15 - 41 U/L 26     ALT 0 - 44 U/L 21         Imaging: Radiology review:  CLINICAL DATA:  Palpable mass at base of  cervical spine   EXAM: ULTRASOUND OF HEAD/NECK SOFT TISSUES   TECHNIQUE: Ultrasound examination of the head and neck soft tissues was performed in the area of clinical concern.   COMPARISON:  None Available.   FINDINGS: Nondescript ovoid mass, horizontally oriented and showing mid level echoes in the subcutaneous fat at the symptomatic level. Dimensions measure 2.8 by 1.3 x 0.7 cm.   IMPRESSION: Palpable complaint correlates with a 3 x 1 cm subcutaneous mass. No diagnostic imaging features but appearance would be compatible with a lipoma. Recommend clinical follow-up for stability.     Electronically Signed   By: Tiburcio Pea M.D.   On: 09/02/2021 08:18 Within last 24 hrs: No results found.  Assessment    Lipomatous mass over vertebral prominence, doubt symptomatic. Patient Active Problem List   Diagnosis Date Noted   Closed displaced supracondylar fracture of distal end of left femur without intracondylar extension (HCC) 02/04/2019   Femur fracture (HCC) 02/04/2019   Leukocytosis 02/04/2019   Alcohol use 02/04/2019   Right leg weakness 02/14/2018   Elevated bilirubin 02/14/2018   SIRS (systemic inflammatory response syndrome) (HCC) 01/30/2018   Hypoglycemia 01/30/2018   Lactic acidosis 01/30/2018   Alcohol abuse 01/30/2018   Abdominal pain, chronic, epigastric    Pancreatitis 08/29/2017   Diabetes (HCC) 08/29/2017   HTN (hypertension) 08/29/2017   HLD (hyperlipidemia) 08/29/2017   GERD (gastroesophageal reflux disease) 08/29/2017   Hypokalemia 08/29/2017   Hypocalcemia 08/29/2017   Primary osteoarthritis of right knee 01/14/2015    Plan    We discussed her options of excision versus continued observation.  Both she and I believe her daughter accompanying her have opted to continue to watch the area, considering things could be worse with scarring, infection or wound. Remain readily available to help this lady should she desire to pursue further  evaluation/excision of this likely benign lipomatous lesion.  Face-to-face time spent with the patient and accompanying care providers(if present) was 20 minutes, with more than 50% of the time spent counseling, educating, and coordinating care of the patient.    These notes generated with voice recognition software. I apologize for typographical errors.  Campbell Lerner M.D., FACS 08/09/2022, 11:15 AM

## 2022-08-10 ENCOUNTER — Encounter: Payer: Self-pay | Admitting: Surgery

## 2022-08-10 ENCOUNTER — Ambulatory Visit (INDEPENDENT_AMBULATORY_CARE_PROVIDER_SITE_OTHER): Payer: Medicaid Other | Admitting: Surgery

## 2022-08-10 VITALS — BP 115/64 | HR 84 | Temp 98.2°F | Ht 60.0 in | Wt 161.4 lb

## 2022-08-10 DIAGNOSIS — D171 Benign lipomatous neoplasm of skin and subcutaneous tissue of trunk: Secondary | ICD-10-CM

## 2022-08-10 NOTE — Patient Instructions (Signed)
If you have any concerns or questions, please feel free to call our office.   Lipoma A lipoma is a noncancerous (benign) tumor that is made up of fat cells. This is a very common type of soft-tissue growth. Lipomas are usually found under the skin (subcutaneous). They may occur in any tissue of the body that contains fat. Common areas for lipomas to appear include the back, arms, shoulders, buttocks, and thighs. Lipomas grow slowly, and they are usually painless. Most lipomas do not cause problems and do not require treatment. What are the causes? The cause of this condition is not known. What increases the risk? You are more likely to develop this condition if: You are 35-56 years old. You have a family history of lipomas. What are the signs or symptoms? A lipoma usually appears as a small, round bump under the skin. In most cases, the lump will: Feel soft or rubbery. Not cause pain or other symptoms. However, if a lipoma is located in an area where it pushes on nerves, it can become painful or cause other symptoms. How is this diagnosed? A lipoma can usually be diagnosed with a physical exam. You may also have tests to confirm the diagnosis and to rule out other conditions. Tests may include: Imaging tests, such as a CT scan or an MRI. Removal of a tissue sample to be looked at under a microscope (biopsy). How is this treated? Treatment for this condition depends on the size of the lipoma and whether it is causing any symptoms. For small lipomas that are not causing problems, no treatment is needed. If a lipoma is bigger or it causes problems, surgery may be done to remove the lipoma. Lipomas can also be removed to improve appearance. Most often, the procedure is done after applying a medicine that numbs the area (local anesthetic). Liposuction may be done to reduce the size of the lipoma before it is removed through surgery, or it may be done to remove the lipoma. Lipomas are removed with  this method to limit incision size and scarring. A liposuction tube is inserted through a small incision into the lipoma, and the contents of the lipoma are removed through the tube with suction. Follow these instructions at home: Watch your lipoma for any changes. Keep all follow-up visits. This is important. Where to find more information OrthoInfo: orthoinfo.aaos.org Contact a health care provider if: Your lipoma becomes larger or hard. Your lipoma becomes painful, red, or increasingly swollen. These could be signs of infection or a more serious condition. Get help right away if: You develop tingling or numbness in an area near the lipoma. This could indicate that the lipoma is causing nerve damage. Summary A lipoma is a noncancerous tumor that is made up of fat cells. Most lipomas do not cause problems and do not require treatment. If a lipoma is bigger or it causes problems, surgery may be done to remove the lipoma. Contact a health care provider if your lipoma becomes larger or hard, or if it becomes painful, red, or increasingly swollen. These could be signs of infection or a more serious condition. This information is not intended to replace advice given to you by your health care provider. Make sure you discuss any questions you have with your health care provider. Document Revised: 01/09/2021 Document Reviewed: 01/09/2021 Elsevier Patient Education  2024 [employer].

## 2022-11-04 ENCOUNTER — Other Ambulatory Visit: Payer: Self-pay | Admitting: Family Medicine

## 2022-11-04 ENCOUNTER — Encounter: Payer: Self-pay | Admitting: Family Medicine

## 2022-11-04 DIAGNOSIS — Z1231 Encounter for screening mammogram for malignant neoplasm of breast: Secondary | ICD-10-CM

## 2023-01-06 ENCOUNTER — Ambulatory Visit
Admission: RE | Admit: 2023-01-06 | Discharge: 2023-01-06 | Disposition: A | Discharge status: Home / Self Care | Payer: Medicaid Other | Source: Ambulatory Visit | Attending: Family Medicine | Admitting: Family Medicine

## 2023-01-06 DIAGNOSIS — Z1231 Encounter for screening mammogram for malignant neoplasm of breast: Secondary | ICD-10-CM | POA: Diagnosis present

## 2023-04-19 ENCOUNTER — Other Ambulatory Visit: Payer: Self-pay | Admitting: Acute Care

## 2023-04-19 DIAGNOSIS — F17210 Nicotine dependence, cigarettes, uncomplicated: Secondary | ICD-10-CM

## 2023-04-19 DIAGNOSIS — Z87891 Personal history of nicotine dependence: Secondary | ICD-10-CM

## 2023-04-19 DIAGNOSIS — Z122 Encounter for screening for malignant neoplasm of respiratory organs: Secondary | ICD-10-CM

## 2023-04-29 ENCOUNTER — Inpatient Hospital Stay
Admission: EM | Admit: 2023-04-29 | Discharge: 2023-05-06 | DRG: 897 | Disposition: A | Discharge status: Home Health | Attending: Internal Medicine | Admitting: Internal Medicine

## 2023-04-29 ENCOUNTER — Encounter: Payer: Self-pay | Admitting: Emergency Medicine

## 2023-04-29 ENCOUNTER — Other Ambulatory Visit: Payer: Self-pay

## 2023-04-29 DIAGNOSIS — E785 Hyperlipidemia, unspecified: Secondary | ICD-10-CM | POA: Diagnosis present

## 2023-04-29 DIAGNOSIS — Z79899 Other long term (current) drug therapy: Secondary | ICD-10-CM

## 2023-04-29 DIAGNOSIS — R531 Weakness: Secondary | ICD-10-CM

## 2023-04-29 DIAGNOSIS — F1090 Alcohol use, unspecified, uncomplicated: Secondary | ICD-10-CM | POA: Diagnosis present

## 2023-04-29 DIAGNOSIS — Z8249 Family history of ischemic heart disease and other diseases of the circulatory system: Secondary | ICD-10-CM

## 2023-04-29 DIAGNOSIS — Z9181 History of falling: Secondary | ICD-10-CM

## 2023-04-29 DIAGNOSIS — Z888 Allergy status to other drugs, medicaments and biological substances status: Secondary | ICD-10-CM

## 2023-04-29 DIAGNOSIS — E872 Acidosis, unspecified: Secondary | ICD-10-CM | POA: Diagnosis present

## 2023-04-29 DIAGNOSIS — Z6826 Body mass index (BMI) 26.0-26.9, adult: Secondary | ICD-10-CM

## 2023-04-29 DIAGNOSIS — E8729 Other acidosis: Secondary | ICD-10-CM

## 2023-04-29 DIAGNOSIS — G709 Myoneural disorder, unspecified: Secondary | ICD-10-CM

## 2023-04-29 DIAGNOSIS — R29898 Other symptoms and signs involving the musculoskeletal system: Principal | ICD-10-CM | POA: Diagnosis present

## 2023-04-29 DIAGNOSIS — K5190 Ulcerative colitis, unspecified, without complications: Secondary | ICD-10-CM

## 2023-04-29 DIAGNOSIS — E876 Hypokalemia: Secondary | ICD-10-CM | POA: Diagnosis present

## 2023-04-29 DIAGNOSIS — F10988 Alcohol use, unspecified with other alcohol-induced disorder: Principal | ICD-10-CM | POA: Diagnosis present

## 2023-04-29 DIAGNOSIS — D72829 Elevated white blood cell count, unspecified: Secondary | ICD-10-CM | POA: Diagnosis present

## 2023-04-29 DIAGNOSIS — I10 Essential (primary) hypertension: Secondary | ICD-10-CM | POA: Diagnosis present

## 2023-04-29 DIAGNOSIS — R634 Abnormal weight loss: Secondary | ICD-10-CM | POA: Insufficient documentation

## 2023-04-29 DIAGNOSIS — J4489 Other specified chronic obstructive pulmonary disease: Secondary | ICD-10-CM | POA: Diagnosis present

## 2023-04-29 DIAGNOSIS — E119 Type 2 diabetes mellitus without complications: Secondary | ICD-10-CM

## 2023-04-29 DIAGNOSIS — Z880 Allergy status to penicillin: Secondary | ICD-10-CM

## 2023-04-29 DIAGNOSIS — R262 Difficulty in walking, not elsewhere classified: Secondary | ICD-10-CM | POA: Diagnosis present

## 2023-04-29 DIAGNOSIS — Z96653 Presence of artificial knee joint, bilateral: Secondary | ICD-10-CM | POA: Diagnosis present

## 2023-04-29 DIAGNOSIS — Z833 Family history of diabetes mellitus: Secondary | ICD-10-CM

## 2023-04-29 DIAGNOSIS — F17210 Nicotine dependence, cigarettes, uncomplicated: Secondary | ICD-10-CM | POA: Diagnosis present

## 2023-04-29 DIAGNOSIS — J449 Chronic obstructive pulmonary disease, unspecified: Secondary | ICD-10-CM

## 2023-04-29 DIAGNOSIS — Z7984 Long term (current) use of oral hypoglycemic drugs: Secondary | ICD-10-CM

## 2023-04-29 DIAGNOSIS — D649 Anemia, unspecified: Secondary | ICD-10-CM | POA: Diagnosis present

## 2023-04-29 DIAGNOSIS — Z7141 Alcohol abuse counseling and surveillance of alcoholic: Secondary | ICD-10-CM

## 2023-04-29 DIAGNOSIS — R824 Acetonuria: Secondary | ICD-10-CM | POA: Diagnosis present

## 2023-04-29 DIAGNOSIS — Z881 Allergy status to other antibiotic agents status: Secondary | ICD-10-CM

## 2023-04-29 DIAGNOSIS — D75839 Thrombocytosis, unspecified: Secondary | ICD-10-CM | POA: Diagnosis present

## 2023-04-29 LAB — URINALYSIS, ROUTINE W REFLEX MICROSCOPIC
Bilirubin Urine: NEGATIVE
Glucose, UA: NEGATIVE mg/dL
Hgb urine dipstick: NEGATIVE
Ketones, ur: 5 mg/dL — AB
Leukocytes,Ua: NEGATIVE
Nitrite: NEGATIVE
Protein, ur: NEGATIVE mg/dL
Specific Gravity, Urine: 1.01 (ref 1.005–1.030)
pH: 5 (ref 5.0–8.0)

## 2023-04-29 LAB — HEPATIC FUNCTION PANEL
ALT: 39 U/L (ref 0–44)
AST: 61 U/L — ABNORMAL HIGH (ref 15–41)
Albumin: 3 g/dL — ABNORMAL LOW (ref 3.5–5.0)
Alkaline Phosphatase: 197 U/L — ABNORMAL HIGH (ref 38–126)
Bilirubin, Direct: 0.4 mg/dL — ABNORMAL HIGH (ref 0.0–0.2)
Indirect Bilirubin: 0.4 mg/dL (ref 0.3–0.9)
Total Bilirubin: 0.8 mg/dL (ref 0.0–1.2)
Total Protein: 6.7 g/dL (ref 6.5–8.1)

## 2023-04-29 LAB — BASIC METABOLIC PANEL WITH GFR
Anion gap: 26 — ABNORMAL HIGH (ref 5–15)
BUN: 7 mg/dL — ABNORMAL LOW (ref 8–23)
CO2: 13 mmol/L — ABNORMAL LOW (ref 22–32)
Calcium: 8.5 mg/dL — ABNORMAL LOW (ref 8.9–10.3)
Chloride: 106 mmol/L (ref 98–111)
Creatinine, Ser: 0.52 mg/dL (ref 0.44–1.00)
GFR, Estimated: >60 mL/min (ref >60)
Glucose, Bld: 130 mg/dL — ABNORMAL HIGH (ref 70–99)
Potassium: 2.7 mmol/L — CL (ref 3.5–5.1)
Sodium: 146 mmol/L — ABNORMAL HIGH (ref 135–145)

## 2023-04-29 LAB — CBC
HCT: 37.3 % (ref 36.0–46.0)
Hemoglobin: 11.3 g/dL — ABNORMAL LOW (ref 12.0–15.0)
MCH: 29.5 pg (ref 26.0–34.0)
MCHC: 30.3 g/dL (ref 30.0–36.0)
MCV: 97.4 fL (ref 80.0–100.0)
Platelets: 440 10*3/uL — ABNORMAL HIGH (ref 150–400)
RBC: 3.83 MIL/uL — ABNORMAL LOW (ref 3.87–5.11)
RDW: 17.4 % — ABNORMAL HIGH (ref 11.5–15.5)
WBC: 29.8 10*3/uL — ABNORMAL HIGH (ref 4.0–10.5)
nRBC: 0.1 % (ref 0.0–0.2)

## 2023-04-29 LAB — RESP PANEL BY RT-PCR (RSV, FLU A&B, COVID)  RVPGX2
Influenza A by PCR: NEGATIVE
Influenza B by PCR: NEGATIVE
Resp Syncytial Virus by PCR: NEGATIVE
SARS Coronavirus 2 by RT PCR: NEGATIVE

## 2023-04-29 LAB — MAGNESIUM: Magnesium: 1.1 mg/dL — ABNORMAL LOW (ref 1.7–2.4)

## 2023-04-29 LAB — CK: Total CK: 93 U/L (ref 38–234)

## 2023-04-29 LAB — TROPONIN I (HIGH SENSITIVITY): Troponin I (High Sensitivity): 12 ng/L (ref <18)

## 2023-04-29 MED ORDER — MAGNESIUM SULFATE 2 GM/50ML IV SOLN
2.0000 g | Freq: Once | INTRAVENOUS | Status: AC
  Administered 2023-04-30: 2 g via INTRAVENOUS
  Filled 2023-04-29: qty 50

## 2023-04-29 MED ORDER — POTASSIUM CHLORIDE 10 MEQ/100ML IV SOLN
10.0000 meq | INTRAVENOUS | Status: AC
  Administered 2023-04-30 (×2): 10 meq via INTRAVENOUS
  Filled 2023-04-29 (×2): qty 100

## 2023-04-29 MED ORDER — POTASSIUM CHLORIDE CRYS ER 20 MEQ PO TBCR
40.0000 meq | EXTENDED_RELEASE_TABLET | Freq: Once | ORAL | Status: AC
  Administered 2023-04-30: 40 meq via ORAL
  Filled 2023-04-29: qty 2

## 2023-04-29 MED ORDER — SODIUM CHLORIDE 0.9 % IV BOLUS
1000.0000 mL | Freq: Once | INTRAVENOUS | Status: AC
  Administered 2023-04-30: 1000 mL via INTRAVENOUS

## 2023-04-29 NOTE — ED Triage Notes (Addendum)
 Pt in from home via [facility] with generalized weakness and falls x 3 days. Pt states she has been getting up and her knees are giving out lately. Denies any injuries or pain at this time, no thinners or LOC. Does report some chills  VS en route: 128/65 99HR 98% CBG 156

## 2023-04-29 NOTE — ED Provider Notes (Signed)
 [facility] Provider Note    Event Date/Time   First MD Initiated Contact with Patient 04/29/23 2310     (approximate)   History   Fall and Weakness   HPI  Sheila [name] is a 63 y.o. female   Past medical history of COPD, diabetes, GERD, hypertension hyperlipidemia, ulcerative colitis on mesalamine  who presents to the Emergency Department with left greater than right leg weakness and inability to walk due to progressive weakness over this 1 week.  Symptoms started and were barely noticeable on Tuesday but have progressively worsened throughout the week now she is unable to get up and walk.  She had no significant trauma.  She denies any upper extremity weakness or any sensory deficits anywhere.  She lives alone.  She cares for her father.  She denies any chest pain, abdominal pain, respiratory infectious symptoms, GI or GU complaints.  She has had no incontinence.  She has no back pain.      Physical Exam   Triage Vital Signs: ED Triage Vitals  Encounter Vitals Group     BP 04/29/23 2157 122/63     Systolic BP Percentile --      Diastolic BP Percentile --      Pulse Rate 04/29/23 2157 99     Resp 04/29/23 2157 16     Temp 04/29/23 2157 98 F (36.7 C)     Temp Source 04/29/23 2157 Oral     SpO2 04/29/23 2157 97 %     Weight 04/29/23 2157 135 lb (61.2 kg)     Height 04/29/23 2157 5' (1.524 m)     Head Circumference --      Peak Flow --      Pain Score 04/29/23 2201 0     Pain Loc --      Pain Education --      Exclude from Growth Chart --     Most recent vital signs: Vitals:   04/29/23 2157  BP: 122/63  Pulse: 99  Resp: 16  Temp: 98 F (36.7 C)  SpO2: 97%    General: Awake, no distress.  CV:  Good peripheral perfusion.  Resp:  Normal effort.  Abd:  No distention.  Other:  Pleasant woman laying in stretcher no acute distress with normal vital signs.  Breathing comfortably with clear lungs, soft nontender abdomen, and upper  extremity motor sensor exam is normal.  No facial asymmetry or dysarthria.  She does have lower extremity weakness left greater than right.  But both lower extremities are weak and she can lift both against gravity but quickly tires and puts them back down.  Sensory intact.   ED Results / Procedures / Treatments   Labs (all labs ordered are listed, but only abnormal results are displayed) Labs Reviewed  BASIC METABOLIC PANEL WITH GFR - Abnormal; Notable for the following components:      Result Value   Sodium 146 (*)    Potassium 2.7 (*)    CO2 13 (*)    Glucose, Bld 130 (*)    BUN 7 (*)    Calcium  8.5 (*)    Anion gap 26 (*)    All other components within normal limits  CBC - Abnormal; Notable for the following components:   WBC 29.8 (*)    RBC 3.83 (*)    Hemoglobin 11.3 (*)    RDW 17.4 (*)    Platelets 440 (*)    All other components within normal  limits  URINALYSIS, ROUTINE W REFLEX MICROSCOPIC - Abnormal; Notable for the following components:   Color, Urine YELLOW (*)    APPearance CLEAR (*)    Ketones, ur 5 (*)    All other components within normal limits  MAGNESIUM  - Abnormal; Notable for the following components:   Magnesium  1.1 (*)    All other components within normal limits  HEPATIC FUNCTION PANEL - Abnormal; Notable for the following components:   Albumin  3.0 (*)    AST 61 (*)    Alkaline Phosphatase 197 (*)    Bilirubin, Direct 0.4 (*)    All other components within normal limits  RESP PANEL BY RT-PCR (RSV, FLU A&B, COVID)  RVPGX2  CK  URINE DRUG SCREEN, QUALITATIVE ([facility] ONLY)  CBG MONITORING, ED  TROPONIN I (HIGH SENSITIVITY)     I ordered and reviewed the above labs they are notable for white blood cell count is markedly elevated at 29.  Her potassium is very low in the twos.  EKG  ED ECG REPORT I, Buell Carmin, the attending physician, personally viewed and interpreted this ECG.   Date: 04/29/2023  EKG Time: 2207  Rate: 98  Rhythm: sinus  Axis:  nl  Intervals:nl  ST&T Change: no stemi   PROCEDURES:  Critical Care performed: Yes, see critical care procedure note(s)  .Critical Care  Performed by: Buell Carmin, MD Authorized by: Buell Carmin, MD   Critical care provider statement:    Critical care time (minutes):  30   Critical care was time spent personally by me on the following activities:  Development of treatment plan with patient or surrogate, discussions with consultants, evaluation of patient's response to treatment, examination of patient, ordering and review of laboratory studies, ordering and review of radiographic studies, ordering and performing treatments and interventions, pulse oximetry, re-evaluation of patient's condition and review of old charts    MEDICATIONS ORDERED IN ED: Medications  potassium chloride  SA (KLOR-CON  M) CR tablet 40 mEq (has no administration in time range)  potassium chloride  10 mEq in 100 mL IVPB (has no administration in time range)  sodium chloride  0.9 % bolus 1,000 mL (has no administration in time range)  magnesium  sulfate IVPB 2 g 50 mL (has no administration in time range)    External physician / consultants:  I spoke with hospital medicine for admission and regarding care plan for this patient.   IMPRESSION / MDM / ASSESSMENT AND PLAN / ED COURSE  I reviewed the triage vital signs and the nursing notes.                                Patient's presentation is most consistent with acute presentation with potential threat to life or bodily function.  Differential diagnosis includes, but is not limited to, stroke, metabolic derangements, rhabdomyolysis, Guillain-Barr syndrome   The patient is on the cardiac monitor to evaluate for evidence of arrhythmia and/or significant heart rate changes.  MDM:    I am concerned she had a stroke because she has unilateral leg weakness but she is outside the thrombolytic window given that this onset was almost 3 days ago now.  She has  no signs of significant trauma on my exam and reports no significant falls or trauma/pain.  She denies any other acute medical complaints on review of systems aside from her bilateral lower extremity weakness left greater than right and this correlates with her physical exam.  I doubt cord compression given no back pain, incontinence.  I am working her up for stroke with stroke labs, CT angiogram of the head and neck, will need an MRI, complete stroke workup as an inpatient as she is unable to walk given her profound weakness.  It comes like that her potassium is markedly low as well which could also account for her weakness.  Her magnesium  is low too- I will order for IV and oral repletion.  I considered Guillain-Barr but I think stroke or her potassium are more likely contributing factors at this time so will defer LP.  She has no respiratory complaints and is maintaining airway very robustly and her hemodynamics appropriate reassuring so I think this would be need to be worked up should her symptoms not get better with electrolyte repletion and if stroke workup does not find a cause.       FINAL CLINICAL IMPRESSION(S) / ED DIAGNOSES   Final diagnoses:  Left leg weakness  Leg weakness, bilateral  Hypokalemia  Hypomagnesemia     Rx / DC Orders   ED Discharge Orders     None        Note:  This document was prepared using Dragon voice recognition software and may include unintentional dictation errors.    Buell Carmin, MD 04/29/23 [phone]

## 2023-04-30 ENCOUNTER — Inpatient Hospital Stay

## 2023-04-30 ENCOUNTER — Emergency Department

## 2023-04-30 ENCOUNTER — Encounter: Payer: Self-pay | Admitting: Radiology

## 2023-04-30 DIAGNOSIS — J4489 Other specified chronic obstructive pulmonary disease: Secondary | ICD-10-CM | POA: Diagnosis present

## 2023-04-30 DIAGNOSIS — I10 Essential (primary) hypertension: Secondary | ICD-10-CM | POA: Diagnosis present

## 2023-04-30 DIAGNOSIS — E119 Type 2 diabetes mellitus without complications: Secondary | ICD-10-CM | POA: Diagnosis present

## 2023-04-30 DIAGNOSIS — Z9181 History of falling: Secondary | ICD-10-CM

## 2023-04-30 DIAGNOSIS — Z888 Allergy status to other drugs, medicaments and biological substances status: Secondary | ICD-10-CM | POA: Diagnosis not present

## 2023-04-30 DIAGNOSIS — E785 Hyperlipidemia, unspecified: Secondary | ICD-10-CM | POA: Diagnosis present

## 2023-04-30 DIAGNOSIS — F10988 Alcohol use, unspecified with other alcohol-induced disorder: Secondary | ICD-10-CM | POA: Diagnosis present

## 2023-04-30 DIAGNOSIS — K5190 Ulcerative colitis, unspecified, without complications: Secondary | ICD-10-CM | POA: Diagnosis present

## 2023-04-30 DIAGNOSIS — F17210 Nicotine dependence, cigarettes, uncomplicated: Secondary | ICD-10-CM | POA: Diagnosis present

## 2023-04-30 DIAGNOSIS — R29898 Other symptoms and signs involving the musculoskeletal system: Secondary | ICD-10-CM | POA: Diagnosis not present

## 2023-04-30 DIAGNOSIS — R824 Acetonuria: Secondary | ICD-10-CM | POA: Diagnosis present

## 2023-04-30 DIAGNOSIS — E876 Hypokalemia: Secondary | ICD-10-CM | POA: Diagnosis present

## 2023-04-30 DIAGNOSIS — R634 Abnormal weight loss: Secondary | ICD-10-CM | POA: Diagnosis present

## 2023-04-30 DIAGNOSIS — Z96653 Presence of artificial knee joint, bilateral: Secondary | ICD-10-CM | POA: Diagnosis present

## 2023-04-30 DIAGNOSIS — E8729 Other acidosis: Secondary | ICD-10-CM | POA: Diagnosis present

## 2023-04-30 DIAGNOSIS — R531 Weakness: Secondary | ICD-10-CM

## 2023-04-30 DIAGNOSIS — Z880 Allergy status to penicillin: Secondary | ICD-10-CM | POA: Diagnosis not present

## 2023-04-30 DIAGNOSIS — Z8249 Family history of ischemic heart disease and other diseases of the circulatory system: Secondary | ICD-10-CM | POA: Diagnosis not present

## 2023-04-30 DIAGNOSIS — Z881 Allergy status to other antibiotic agents status: Secondary | ICD-10-CM | POA: Diagnosis not present

## 2023-04-30 DIAGNOSIS — D75839 Thrombocytosis, unspecified: Secondary | ICD-10-CM | POA: Diagnosis present

## 2023-04-30 DIAGNOSIS — E872 Acidosis, unspecified: Secondary | ICD-10-CM | POA: Diagnosis not present

## 2023-04-30 DIAGNOSIS — Z7984 Long term (current) use of oral hypoglycemic drugs: Secondary | ICD-10-CM | POA: Diagnosis not present

## 2023-04-30 DIAGNOSIS — J449 Chronic obstructive pulmonary disease, unspecified: Secondary | ICD-10-CM

## 2023-04-30 DIAGNOSIS — G709 Myoneural disorder, unspecified: Secondary | ICD-10-CM

## 2023-04-30 DIAGNOSIS — Z6826 Body mass index (BMI) 26.0-26.9, adult: Secondary | ICD-10-CM | POA: Diagnosis not present

## 2023-04-30 DIAGNOSIS — Z7141 Alcohol abuse counseling and surveillance of alcoholic: Secondary | ICD-10-CM | POA: Diagnosis not present

## 2023-04-30 DIAGNOSIS — D72829 Elevated white blood cell count, unspecified: Secondary | ICD-10-CM | POA: Diagnosis present

## 2023-04-30 DIAGNOSIS — D649 Anemia, unspecified: Secondary | ICD-10-CM | POA: Diagnosis present

## 2023-04-30 LAB — BASIC METABOLIC PANEL WITH GFR
Anion gap: 11 (ref 5–15)
BUN: 6 mg/dL — ABNORMAL LOW (ref 8–23)
CO2: 25 mmol/L (ref 22–32)
Calcium: 7.8 mg/dL — ABNORMAL LOW (ref 8.9–10.3)
Chloride: 106 mmol/L (ref 98–111)
Creatinine, Ser: 0.45 mg/dL (ref 0.44–1.00)
GFR, Estimated: >60 mL/min (ref >60)
Glucose, Bld: 340 mg/dL — ABNORMAL HIGH (ref 70–99)
Potassium: 3.4 mmol/L — ABNORMAL LOW (ref 3.5–5.1)
Sodium: 142 mmol/L (ref 135–145)

## 2023-04-30 LAB — CBG MONITORING, ED
Glucose-Capillary: 125 mg/dL — ABNORMAL HIGH (ref 70–99)
Glucose-Capillary: 167 mg/dL — ABNORMAL HIGH (ref 70–99)
Glucose-Capillary: 122 mg/dL — ABNORMAL HIGH (ref 70–99)
Glucose-Capillary: 142 mg/dL — ABNORMAL HIGH (ref 70–99)

## 2023-04-30 LAB — CBC
HCT: 30 % — ABNORMAL LOW (ref 36.0–46.0)
Hemoglobin: 9.3 g/dL — ABNORMAL LOW (ref 12.0–15.0)
MCH: 29.4 pg (ref 26.0–34.0)
MCHC: 31 g/dL (ref 30.0–36.0)
MCV: 94.9 fL (ref 80.0–100.0)
Platelets: 337 10*3/uL (ref 150–400)
RBC: 3.16 MIL/uL — ABNORMAL LOW (ref 3.87–5.11)
RDW: 17 % — ABNORMAL HIGH (ref 11.5–15.5)
WBC: 19.4 10*3/uL — ABNORMAL HIGH (ref 4.0–10.5)
nRBC: 0 % (ref 0.0–0.2)

## 2023-04-30 LAB — PROCALCITONIN: Procalcitonin: 0.6 ng/mL

## 2023-04-30 LAB — LACTIC ACID, PLASMA
Lactic Acid, Venous: 4.9 mmol/L (ref 0.5–1.9)
Lactic Acid, Venous: 2.7 mmol/L (ref 0.5–1.9)

## 2023-04-30 LAB — MAGNESIUM: Magnesium: 1.2 mg/dL — ABNORMAL LOW (ref 1.7–2.4)

## 2023-04-30 LAB — BETA-HYDROXYBUTYRIC ACID: Beta-Hydroxybutyric Acid: 0.53 mmol/L — ABNORMAL HIGH (ref 0.05–0.27)

## 2023-04-30 LAB — ETHANOL: Alcohol, Ethyl (B): <15 mg/dL (ref <15)

## 2023-04-30 LAB — URINE DRUG SCREEN, QUALITATIVE (ARMC ONLY)
Amphetamines, Ur Screen: NOT DETECTED
Barbiturates, Ur Screen: NOT DETECTED
Benzodiazepine, Ur Scrn: NOT DETECTED
Cannabinoid 50 Ng, Ur ~~LOC~~: NOT DETECTED
Cocaine Metabolite,Ur ~~LOC~~: NOT DETECTED
MDMA (Ecstasy)Ur Screen: NOT DETECTED
Methadone Scn, Ur: NOT DETECTED
Opiate, Ur Screen: NOT DETECTED
Phencyclidine (PCP) Ur S: NOT DETECTED
Tricyclic, Ur Screen: NOT DETECTED

## 2023-04-30 LAB — IRON AND TIBC
Iron: 46 ug/dL (ref 28–170)
Saturation Ratios: 14 % (ref 10.4–31.8)
TIBC: 319 ug/dL (ref 250–450)
UIBC: 273 ug/dL

## 2023-04-30 LAB — FERRITIN: Ferritin: 95 ng/mL (ref 11–307)

## 2023-04-30 LAB — HIV ANTIBODY (ROUTINE TESTING W REFLEX): HIV Screen 4th Generation wRfx: NONREACTIVE

## 2023-04-30 LAB — RETICULOCYTES
Immature Retic Fract: 38.5 % — ABNORMAL HIGH (ref 2.3–15.9)
RBC.: 3.59 MIL/uL — ABNORMAL LOW (ref 3.87–5.11)
Retic Count, Absolute: 144.7 10*3/uL (ref 19.0–186.0)
Retic Ct Pct: 4 % — ABNORMAL HIGH (ref 0.4–3.1)

## 2023-04-30 LAB — VITAMIN B12: Vitamin B-12: 751 pg/mL (ref 180–914)

## 2023-04-30 LAB — HEMOGLOBIN A1C
Hgb A1c MFr Bld: 5 % (ref 4.8–5.6)
Mean Plasma Glucose: 96.8 mg/dL

## 2023-04-30 LAB — FOLATE: Folate: 6.2 ng/mL (ref >5.9)

## 2023-04-30 MED ORDER — LACTATED RINGERS IV SOLN
150.0000 mL/h | INTRAVENOUS | Status: DC
  Administered 2023-04-30: 150 mL/h via INTRAVENOUS

## 2023-04-30 MED ORDER — SODIUM CHLORIDE 0.9 % IV SOLN
2.0000 g | Freq: Two times a day (BID) | INTRAVENOUS | Status: DC
  Administered 2023-04-30: 2 g via INTRAVENOUS
  Filled 2023-04-30: qty 12.5

## 2023-04-30 MED ORDER — ADULT MULTIVITAMIN W/MINERALS CH
1.0000 | ORAL_TABLET | Freq: Every day | ORAL | Status: DC
  Administered 2023-04-30 – 2023-05-06 (×7): 1 via ORAL
  Filled 2023-04-30 (×7): qty 1

## 2023-04-30 MED ORDER — NICOTINE 14 MG/24HR TD PT24
14.0000 mg | MEDICATED_PATCH | Freq: Every day | TRANSDERMAL | Status: DC
Start: 2023-04-30 — End: 2023-05-06
  Administered 2023-04-30 – 2023-05-06 (×7): 14 mg via TRANSDERMAL
  Filled 2023-04-30 (×7): qty 1

## 2023-04-30 MED ORDER — KETOROLAC TROMETHAMINE 30 MG/ML IJ SOLN
30.0000 mg | Freq: Four times a day (QID) | INTRAMUSCULAR | Status: AC | PRN
  Administered 2023-04-30 – 2023-05-01 (×2): 30 mg via INTRAVENOUS
  Filled 2023-04-30 (×2): qty 1

## 2023-04-30 MED ORDER — INSULIN ASPART 100 UNIT/ML IJ SOLN: 0.0000 [IU] | Freq: Every day | INTRAMUSCULAR | Status: DC

## 2023-04-30 MED ORDER — THIAMINE HCL 100 MG/ML IJ SOLN: 100.0000 mg | Freq: Every day | INTRAMUSCULAR | Status: DC

## 2023-04-30 MED ORDER — VANCOMYCIN HCL 1500 MG/300ML IV SOLN
1500.0000 mg | Freq: Once | INTRAVENOUS | Status: AC
  Administered 2023-04-30: 1500 mg via INTRAVENOUS
  Filled 2023-04-30 (×2): qty 300

## 2023-04-30 MED ORDER — LORAZEPAM 2 MG/ML IJ SOLN: 1.0000 mg | INTRAMUSCULAR | Status: AC | PRN

## 2023-04-30 MED ORDER — ONDANSETRON HCL 4 MG PO TABS: 4.0000 mg | ORAL_TABLET | Freq: Four times a day (QID) | ORAL | Status: DC | PRN

## 2023-04-30 MED ORDER — SODIUM CHLORIDE 0.9 % IV SOLN: 2.0000 g | Freq: Once | INTRAVENOUS | Status: DC

## 2023-04-30 MED ORDER — SODIUM BICARBONATE 8.4 % IV SOLN
Freq: Once | INTRAVENOUS | Status: AC
  Filled 2023-04-30: qty 150

## 2023-04-30 MED ORDER — FOLIC ACID 1 MG PO TABS
1.0000 mg | ORAL_TABLET | Freq: Every day | ORAL | Status: DC
  Administered 2023-04-30 – 2023-05-06 (×7): 1 mg via ORAL
  Filled 2023-04-30 (×7): qty 1

## 2023-04-30 MED ORDER — OXYCODONE HCL 5 MG PO TABS
5.0000 mg | ORAL_TABLET | ORAL | Status: DC | PRN
  Administered 2023-05-02 – 2023-05-04 (×4): 5 mg via ORAL
  Filled 2023-04-30 (×4): qty 1

## 2023-04-30 MED ORDER — METRONIDAZOLE 500 MG/100ML IV SOLN
500.0000 mg | Freq: Two times a day (BID) | INTRAVENOUS | Status: DC
  Administered 2023-04-30: 500 mg via INTRAVENOUS
  Filled 2023-04-30: qty 100

## 2023-04-30 MED ORDER — ACETAMINOPHEN 650 MG RE SUPP: 650.0000 mg | Freq: Four times a day (QID) | RECTAL | Status: DC | PRN

## 2023-04-30 MED ORDER — ONDANSETRON HCL 4 MG/2ML IJ SOLN: 4.0000 mg | Freq: Four times a day (QID) | INTRAMUSCULAR | Status: DC | PRN

## 2023-04-30 MED ORDER — IOHEXOL 350 MG/ML SOLN
75.0000 mL | Freq: Once | INTRAVENOUS | Status: AC | PRN
  Administered 2023-04-30: 75 mL via INTRAVENOUS

## 2023-04-30 MED ORDER — DEXTROSE 5 % IN LACTATED RINGERS IV BOLUS
1000.0000 mL | Freq: Once | INTRAVENOUS | Status: AC
  Administered 2023-04-30: 1000 mL via INTRAVENOUS
  Filled 2023-04-30: qty 1000

## 2023-04-30 MED ORDER — INSULIN ASPART 100 UNIT/ML IJ SOLN
0.0000 [IU] | Freq: Three times a day (TID) | INTRAMUSCULAR | Status: DC
  Administered 2023-04-30: 2 [IU] via SUBCUTANEOUS
  Administered 2023-04-30 (×2): 1 [IU] via SUBCUTANEOUS
  Administered 2023-05-01: 3 [IU] via SUBCUTANEOUS
  Administered 2023-05-01: 1 [IU] via SUBCUTANEOUS
  Administered 2023-05-01 – 2023-05-02 (×2): 2 [IU] via SUBCUTANEOUS
  Administered 2023-05-02 – 2023-05-03 (×3): 1 [IU] via SUBCUTANEOUS
  Administered 2023-05-03: 2 [IU] via SUBCUTANEOUS
  Administered 2023-05-03 – 2023-05-04 (×2): 1 [IU] via SUBCUTANEOUS
  Administered 2023-05-04: 2 [IU] via SUBCUTANEOUS
  Filled 2023-04-30 (×14): qty 1

## 2023-04-30 MED ORDER — ENOXAPARIN SODIUM 40 MG/0.4ML IJ SOSY
40.0000 mg | PREFILLED_SYRINGE | INTRAMUSCULAR | Status: DC
  Administered 2023-04-30 – 2023-05-06 (×7): 40 mg via SUBCUTANEOUS
  Filled 2023-04-30 (×7): qty 0.4

## 2023-04-30 MED ORDER — MAGNESIUM SULFATE 2 GM/50ML IV SOLN
2.0000 g | INTRAVENOUS | Status: AC
  Administered 2023-04-30 (×2): 2 g via INTRAVENOUS
  Filled 2023-04-30 (×2): qty 50

## 2023-04-30 MED ORDER — LACTATED RINGERS IV SOLN: INTRAVENOUS | Status: AC

## 2023-04-30 MED ORDER — VANCOMYCIN HCL IN DEXTROSE 1-5 GM/200ML-% IV SOLN: 1000.0000 mg | INTRAVENOUS | Status: DC

## 2023-04-30 MED ORDER — SODIUM CHLORIDE 0.9 % IV SOLN
2.0000 g | INTRAVENOUS | Status: DC
  Administered 2023-04-30 – 2023-05-05 (×6): 2 g via INTRAVENOUS
  Filled 2023-04-30 (×7): qty 20

## 2023-04-30 MED ORDER — THIAMINE MONONITRATE 100 MG PO TABS
100.0000 mg | ORAL_TABLET | Freq: Every day | ORAL | Status: DC
  Administered 2023-04-30 – 2023-05-06 (×7): 100 mg via ORAL
  Filled 2023-04-30 (×7): qty 1

## 2023-04-30 MED ORDER — VANCOMYCIN HCL IN DEXTROSE 1-5 GM/200ML-% IV SOLN
1000.0000 mg | Freq: Once | INTRAVENOUS | Status: DC
Start: 2023-04-30 — End: 2023-04-30

## 2023-04-30 MED ORDER — POTASSIUM CHLORIDE CRYS ER 20 MEQ PO TBCR
40.0000 meq | EXTENDED_RELEASE_TABLET | ORAL | Status: AC
  Administered 2023-04-30 (×2): 40 meq via ORAL
  Filled 2023-04-30 (×2): qty 2

## 2023-04-30 MED ORDER — ACETAMINOPHEN 325 MG PO TABS
650.0000 mg | ORAL_TABLET | Freq: Four times a day (QID) | ORAL | Status: DC | PRN
  Administered 2023-04-30 – 2023-05-04 (×3): 650 mg via ORAL
  Filled 2023-04-30 (×3): qty 2

## 2023-04-30 MED ORDER — LORAZEPAM 1 MG PO TABS: 1.0000 mg | ORAL_TABLET | ORAL | Status: AC | PRN

## 2023-04-30 NOTE — ED Notes (Signed)
Pt assisted up to bedside commode.  

## 2023-04-30 NOTE — Assessment & Plan Note (Addendum)
 Infection versus alcohol/acidosis related, lower suspicion for infection quite meeting sepsis criteria Continue IV fluid resuscitation Will await procalcitonin--->0.60 Addendum: Possible sepsis --->start empiric antibiotic--cefepime  vancomycin  and Flagyl  with rapid de-escalation if otherwise not supported

## 2023-04-30 NOTE — H&P (Signed)
 History and Physical    Patient: Sheila [name] ZOX:[id number] DOB: [birth year] DOA: 04/29/2023 DOS: the patient was seen and examined on 04/30/2023 PCP: Almetta Armor, MD  Patient coming from: Home  Chief Complaint:  Chief Complaint  Patient presents with   Fall   Weakness    HPI: Sheila [name] is a 63 y.o. female with medical history significant for COPD, DM, alcohol use disorder, ulcerative colitis on mesalamine , "neuromuscular disorder" with history of falls, gout, hypertension presenting with generalized weakness and falls patient had a viral illness with cough and congestion a couple weeks ago and had been recovering well until 3 days ago when she became very weak resulting in falls.  Denies lightheadedness but feels like her legs just give out when she tries to walk.  She has been staying in bed due to the weakness.  She denies chest pain, shortness of breath, fever or chills, vomiting or diarrhea or abdominal pain or dysuria.  Has cut back on her drinking since the illness.  Drinking 3-4 drinks about 3 to 4 days a week.  Endorses weight loss over the past ED course and data review: Vitals within normal limits. Labs notable for  Hypokalemia of 2.7 and hypomagnesemia of 1.1.  Metabolic acidosis with anion gap of 26 and bicarb of 13,  WBC 30,000 with thrombocytosis of 440 and anemia of 11.3 LFTs notable for elevated AST/ALT ratio of 61/39 and elevated alk phos of 197 CK normal at 93 Troponin 12 Respiratory viral panel negative Urinalysis with mild ketonuria 5.  EKG, personally reviewed and interpreted showing NSR at 98 with no acute ST-T wave changes  CTA head and neck nonacute MRI brain ordered and pending  Patient started on oral and IV potassium and IV magnesium   Hospitalist consulted for admission.   Review of Systems: As mentioned in the history of present illness. All other systems reviewed and are negative.  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   Renal disorder    Ulcerative colitis (HCC)    on mesalamine    Past Surgical History:  Procedure Laterality Date   COLONOSCOPY WITH PROPOFOL  N/A 08/19/2015   Procedure: COLONOSCOPY WITH PROPOFOL ;  Surgeon: Deveron Fly, MD;  Location: [facility] ENDOSCOPY;  Service: Endoscopy;  Laterality: N/A;   COLONOSCOPY WITH PROPOFOL  N/A 12/08/2015   Procedure: COLONOSCOPY WITH PROPOFOL ;  Surgeon: Deveron Fly, MD;  Location: [facility] ENDOSCOPY;  Service: Endoscopy;  Laterality: N/A;   COLONOSCOPY WITH PROPOFOL  N/A 12/09/2015   Procedure: COLONOSCOPY WITH PROPOFOL ;  Surgeon: Deveron Fly, MD;  Location: [facility] ENDOSCOPY;  Service: Endoscopy;  Laterality: N/A;   COLONOSCOPY WITH PROPOFOL  N/A 04/29/2021   Procedure: COLONOSCOPY WITH PROPOFOL ;  Surgeon: Toledo, Alphonsus Jeans, MD;  Location: [facility] ENDOSCOPY;  Service: Gastroenterology;  Laterality: N/A;  DM   ESOPHAGOGASTRODUODENOSCOPY (EGD) WITH PROPOFOL  N/A 11/08/2017   Procedure: ESOPHAGOGASTRODUODENOSCOPY (EGD) WITH PROPOFOL ;  Surgeon: Peg Bouton,  Leonarda Rakers, MD;  Location: [facility] ENDOSCOPY;  Service: Endoscopy;  Laterality: N/A;   FRACTURE SURGERY     JOINT REPLACEMENT Left 04/19/2006   JOINT REPLACEMENT Right 01/14/2015   ORIF FEMUR FRACTURE Left 02/05/2019   ORIF FEMUR FRACTURE Left 02/05/2019   Procedure: OPEN REDUCTION INTERNAL FIXATION (ORIF) DISTAL FEMUR FRACTURE;  Surgeon: Hardy Lia, MD;  Location: [facility] OR;  Service: Orthopedics;  Laterality: Left;   TOTAL KNEE ARTHROPLASTY Left  2008   TOTAL KNEE ARTHROPLASTY Right 01/14/2015   Procedure: TOTAL KNEE ARTHROPLASTY;  Surgeon: Molli Angelucci, MD;  Location: [facility] ORS;  Service: Orthopedics;  Laterality: Right;   Social History:  reports that she has been smoking cigarettes. She started smoking about 87 years ago. She has a 21 pack-year smoking history. She has never used smokeless tobacco. She reports current alcohol use. She reports that she does not use drugs.  Allergies  Allergen Reactions   Ampicillin Anaphylaxis   Erythromycin Anaphylaxis   Omeprazole Anaphylaxis   Penicillins Anaphylaxis and Other (See Comments)    Has patient had a PCN reaction causing immediate rash, facial/tongue/throat swelling, SOB or lightheadedness with hypotension: Yes Has patient had a PCN reaction causing severe rash involving mucus membranes or skin necrosis: No Has patient had a PCN reaction that required hospitalization: Unknown Has patient had a PCN reaction occurring within the last 10 years: Yes If all of the above answers are "NO", then may proceed with Cephalosporin use.    Triamterene-Hctz Shortness Of Breath and Swelling   Quinapril Cough    Family History  Problem Relation Age of Onset   Hypertension Mother    Diabetes Father    Hypertension Father    Diabetes Brother    Hypertension Brother    Breast cancer Neg Hx     Prior to Admission medications   Medication Sig Start Date End Date Taking? Authorizing Provider  VITAMIN D -1000 MAX ST 25 MCG (1000 UT) tablet Take 1,000 Units by mouth daily. 02/18/23  Yes [provider]  acetaminophen  (TYLENOL ) 500 MG tablet Take 1 tablet (500 mg total) by mouth every 12 (twelve) hours. 02/07/19   Marisela Sicks, PA-C  albuterol  (PROVENTIL  HFA;VENTOLIN  HFA) 108 (90 BASE) MCG/ACT inhaler Inhale 2 puffs into the lungs every 4 (four) hours as needed for wheezing or shortness of breath.    [provider]  allopurinol  (ZYLOPRIM ) 100 MG tablet Take 100 mg by mouth daily.  08/04/17   [provider]  ascorbic acid  (VITAMIN C) 500 MG tablet Take 1 tablet (500 mg total) by mouth daily. 02/07/19   Marisela Sicks, PA-C  bimatoprost (LUMIGAN) 0.03 % ophthalmic solution Place 1 drop into both eyes at bedtime.    [provider]  brinzolamide  (AZOPT ) 1 % ophthalmic suspension Place 1 drop into both eyes 2 (two) times daily.    [provider]  chlorthalidone  (HYGROTON ) 25 MG tablet Take 25 mg by mouth daily.  01/04/15   [provider]  [facility] VITAMIN B12 1000 MCG tablet Take 1,000 mcg by mouth daily. 07/20/17   [provider]  cyclobenzaprine  (FLEXERIL ) 5 MG tablet Take 1 tablet (5 mg total) by mouth 3 (three) times daily as needed. 09/24/21   Menshew, Raye Cai, PA-C  docusate sodium  (COLACE) 100 MG capsule Take 1 capsule (100 mg total) by mouth 2 (two) times daily. 02/07/19   Marisela Sicks, PA-C  dorzolamide  (TRUSOPT ) 2 % ophthalmic solution 1 drop 3 (three) times daily.    [provider]  enoxaparin  (LOVENOX ) 40 MG/0.4ML injection Inject 0.4 mLs (40 mg total) into the skin daily for 21 days. 02/08/19 03/01/19  Marisela Sicks, PA-C  folic acid  (FOLVITE ) 1 MG tablet Take 1 tablet (1 mg total) by mouth daily. 02/17/18   Gouru, Aruna, MD  gabapentin  (NEURONTIN ) 300 MG capsule Take 1 capsule (300 mg total) by mouth 3 (three) times daily. Patient taking differently: Take 300 mg by mouth 2 (two) times daily. 02/16/18   Gouru, Aruna, MD  HYDROcodone -acetaminophen  (NORCO) 10-325 MG tablet Take 1 tablet by mouth every 6 (six) hours as needed.    [provider]  KLOR-CON  M20 20 MEQ tablet Take 2 tablets (40 mEq total) by mouth daily. 02/16/18   Gouru, Aruna, MD  latanoprost  (XALATAN ) 0.005 % ophthalmic solution Place 1 drop into both eyes at bedtime.    [provider]  linagliptin  (TRADJENTA ) 5 MG TABS tablet Take 1 tablet (5 mg total) by mouth daily. 02/01/18   Brenna Cam, MD  losartan  (COZAAR ) 25 MG tablet Take 25 mg by  mouth daily.     [provider]  mesalamine  (LIALDA ) 1.2 g EC tablet Take 2.4 g by mouth daily with breakfast.    [provider]  metFORMIN  (GLUCOPHAGE -XR) 500 MG 24 hr tablet Take 500 mg by mouth 2 (two) times daily.    [provider]  metoprolol  succinate (TOPROL -XL) 25 MG 24 hr tablet Take 25 mg by mouth daily.    [provider]  metoprolol  succinate (TOPROL -XL) 50 MG 24 hr tablet Take 50 mg by mouth at bedtime. 01/17/18   [provider]  Multiple Vitamin (MULTIVITAMIN WITH MINERALS) TABS tablet Take 1 tablet by mouth daily. 02/17/18   Gouru, Aruna, MD  pantoprazole  (PROTONIX ) 40 MG tablet Take 40 mg by mouth every morning.    [provider]  pravastatin  (PRAVACHOL ) 10 MG tablet Take 10 mg by mouth every morning.     [provider]  sucralfate  (CARAFATE ) 1 g tablet Take 1 g by mouth 2 (two) times daily.    [provider]  thiamine  100 MG tablet Take 1 tablet (100 mg total) by mouth daily. 02/17/18   Emilio Harder, MD    Physical Exam: Vitals:   04/29/23 2202 04/29/23 2330 04/30/23 0030 04/30/23 0100  BP:  (!) 144/67 118/70 (!) 114/59  Pulse:  95 96 99  Resp:  (!) 21 17 13   Temp:      TempSrc:      SpO2:  99% 97% 100%  Weight: 61.2 kg     Height:       Physical Exam  Labs on Admission: I have personally reviewed following labs and imaging studies  CBC: Recent Labs  Lab 04/29/23 2205  WBC 29.8*  HGB 11.3*  HCT 37.3  MCV 97.4  PLT 440*   Basic Metabolic Panel: Recent Labs  Lab 04/29/23 2205  NA 146*  K 2.7*  CL 106  CO2 13*  GLUCOSE 130*  BUN 7*  CREATININE 0.52  CALCIUM  8.5*  MG 1.1*   GFR: Estimated Creatinine Clearance: 59.6 mL/min (by C-G formula based on SCr of 0.52 mg/dL). Liver Function Tests: Recent Labs  Lab 04/29/23 2205  AST 61*  ALT 39  ALKPHOS 197*  BILITOT 0.8  PROT 6.7  ALBUMIN  3.0*   No results for input(s): "LIPASE", "AMYLASE" in the last 168 hours. No results  for input(s): "AMMONIA" in the last 168 hours. Coagulation Profile: No results for input(s): "INR", "PROTIME" in the  last 168 hours. Cardiac Enzymes: Recent Labs  Lab 04/29/23 2205  CKTOTAL 93   BNP (last 3 results) No results for input(s): "PROBNP" in the last 8760 hours. HbA1C: No results for input(s): "HGBA1C" in the last 72 hours. CBG: No results for input(s): "GLUCAP" in the last 168 hours. Lipid Profile: No results for input(s): "CHOL", "HDL", "LDLCALC", "TRIG", "CHOLHDL", "LDLDIRECT" in the last 72 hours. Thyroid  Function Tests: No results for input(s): "TSH", "T4TOTAL", "FREET4", "T3FREE", "THYROIDAB" in the last 72 hours. Anemia Panel: No results for input(s): "VITAMINB12", "FOLATE", "FERRITIN", "TIBC", "IRON", "RETICCTPCT" in the last 72 hours. Urine analysis:    Component Value Date/Time   COLORURINE YELLOW (A) 04/29/2023 2203   APPEARANCEUR CLEAR (A) 04/29/2023 2203   LABSPEC 1.010 04/29/2023 2203   PHURINE 5.0 04/29/2023 2203   GLUCOSEU NEGATIVE 04/29/2023 2203   HGBUR NEGATIVE 04/29/2023 2203   BILIRUBINUR NEGATIVE 04/29/2023 2203   KETONESUR 5 (A) 04/29/2023 2203   PROTEINUR NEGATIVE 04/29/2023 2203   NITRITE NEGATIVE 04/29/2023 2203   LEUKOCYTESUR NEGATIVE 04/29/2023 2203    Radiological Exams on Admission: CT Angio Head Neck W WO CM Result Date: 04/30/2023 CLINICAL DATA:  Initial evaluation for neuro deficit, stroke suspected. EXAM: CT ANGIOGRAPHY HEAD AND NECK WITH AND WITHOUT CONTRAST TECHNIQUE: Multidetector CT imaging of the head and neck was performed using the standard protocol during bolus administration of intravenous contrast. Multiplanar CT image reconstructions and MIPs were obtained to evaluate the vascular anatomy. Carotid stenosis measurements (when applicable) are obtained utilizing NASCET criteria, using the distal internal carotid diameter as the denominator. RADIATION DOSE REDUCTION: This exam was performed according to the departmental  dose-optimization program which includes automated exposure control, adjustment of the mA and/or kV according to patient size and/or use of iterative reconstruction technique. CONTRAST:  75mL OMNIPAQUE  IOHEXOL  350 MG/ML SOLN COMPARISON:  Prior study from 02/15/2018. FINDINGS: CT HEAD FINDINGS Brain: Age-related cerebral atrophy with moderate chronic microvascular ischemic disease. No acute intracranial hemorrhage. No acute large vessel territory infarct. No mass lesion or midline shift. No hydrocephalus or extra-axial fluid collection. Vascular: No hyperdense vessel. Skull: Scalp soft tissues within normal limits.  Calvarium intact. Sinuses/Orbits: Globes orbital soft tissues within normal limits. Visualized paranasal sinuses and mastoid air cells are largely clear. Other: None. Review of the MIP images confirms the above findings CTA NECK FINDINGS Aortic arch: Standard branching. Imaged portion shows no evidence of aneurysm or dissection. No significant stenosis of the major arch vessel origins. Mild aortic atherosclerosis. Right carotid system: No evidence of dissection, stenosis (50% or greater), or occlusion. Left carotid system: No evidence of dissection, stenosis (50% or greater), or occlusion. Vertebral arteries: No evidence of dissection, stenosis (50% or greater), or occlusion. Skeleton: No discrete or worrisome osseous lesions. Other neck: No other acute finding. Upper chest: No other acute finding. Review of the MIP images confirms the above findings CTA HEAD FINDINGS Anterior circulation: Both internal carotid arteries widely patent to the termini without stenosis. A1 segments widely patent. Normal anterior communicating artery complex. Both anterior cerebral arteries widely patent to their distal aspects without stenosis. No M1 stenosis or occlusion. Normal MCA bifurcations. Distal MCA branches well perfused and symmetric. Posterior circulation: Both V4 segments patent to the vertebrobasilar junction  without stenosis. Both PICA origins patent and normal. Basilar widely patent to its distal aspect without stenosis. Superior cerebellar arteries patent bilaterally. Both PCAs primarily supplied via the basilar and are well perfused to there distal aspects. Venous sinuses: Patent allowing for timing the contrast  bolus. Anatomic variants: None significant.  No aneurysm. Review of the MIP images confirms the above findings IMPRESSION: CT HEAD: 1. No acute intracranial abnormality. 2. Age-related cerebral atrophy with moderate chronic microvascular ischemic disease. CTA HEAD AND NECK: 1. Negative CTA of the head and neck. No large vessel occlusion, hemodynamically significant stenosis, or other acute vascular abnormality. 2.  Aortic Atherosclerosis (ICD10-I70.0). Electronically Signed   By: Virgia Griffins M.D.   On: 04/30/2023 01:15     Data Reviewed: Relevant notes from primary care and specialist visits, past discharge summaries as available in EHR, including Care Everywhere. Prior diagnostic testing as pertinent to current admission diagnoses Updated medications and problem lists for reconciliation ED course, including vitals, labs, imaging, treatment and response to treatment Triage notes, nursing and pharmacy notes and ED provider's notes Notable results as noted in HPI   Assessment and Plan: * Alcoholic ketoacidosis, suspected Alcohol use disorder Patient presents with generalized weakness and falls, hypokalemia, hypomagnesemia, anion gap metabolic acidosis, ketonuria, 2-1 AST/ALT ratio No GI symptoms of vomiting and diarrhea Suspecting alcoholic ketoacidosis, possibly triggered by recent cut back in drinking D5/LR bolus followed by bicarb in D5 IV thiamine  Replete sodium and magnesium  (outlined on the respective problem) EtOH, lactic acid ordered Serial BMP CIWA withdrawal protocol  Hypomagnesemia Suspect related to alcoholic ketoacidosis No increased diarrhea related to her  ulcerative colitis Urinalysis and chest x-ray unremarkable IV magnesium  repletion ordered in the ED Continue to monitor and replete as needed  Hypokalemia Suspect related to alcoholic ketoacidosis in combination with diuretic chlorthalidone  No increased diarrhea related to her ulcerative colitis Received oral and IV repletion in the ED Continue to monitor and replete as needed No EKG changes Cardiac monitoring  Leukocytosis Thrombocytosis Anemia WBC almost 30,000 with thrombocytosis Uncertain etiology, no localizing signs of infection.  UA unremarkable.  Chest x-ray clear ?leukemoid reaction Anemia panel Will get lactic acid, procalcitonin--->0.60 Follow blood cultures Consider hematology consult  Lactic acidosis Infection versus alcohol/acidosis related, lower suspicion for infection quite meeting sepsis criteria Continue IV fluid resuscitation Will await procalcitonin--->0.60 Addendum: Possible sepsis --->start empiric antibiotic--cefepime  vancomycin  and Flagyl  with rapid de-escalation if otherwise not supported  Generalized weakness History of falls Secondary to electrolyte derangements, possible occult infection CTA head and neck and MRI brain with no acute abnormalities Chart review reveals history of neuromuscular disorder however no specialist visits related to same, and patient denies knowledge of any such disorder Correct acute metabolic derangements PT OT consult  Ulcerative colitis (HCC) Not acutely flared Continue mesalamine   HTN (hypertension) Blood pressure on the soft side 114/59 so will hold home antihypertensives tonight of chlorthalidone , losartan , metoprolol   COPD (chronic obstructive pulmonary disease) (HCC) Not acutely exacerbated Albuterol  as needed  Alcohol use disorder For counseling on abstinence  Diabetes (HCC) Sliding scale insulin  coverage  Unintentional weight loss Patient endorses unintentional weight loss Recommend  age-appropriate screening Heme-onc consult as above     DVT prophylaxis: Lovenox   Consults: none  Advance Care Planning:   Code Status: Prior   Family Communication: none  Disposition Plan: Back to previous home environment  Severity of Illness: The appropriate patient status for this patient is OBSERVATION. Observation status is judged to be reasonable and necessary in order to provide the required intensity of service to ensure the patient's safety. The patient's presenting symptoms, physical exam findings, and initial radiographic and laboratory data in the context of their medical condition is felt to place them at decreased risk for further clinical deterioration. Furthermore, it is  anticipated that the patient will be medically stable for discharge from the hospital within 2 midnights of admission.   CRITICAL CARE Performed by: Lanetta Pion   Total critical care time: 80 minutes  Critical care time was exclusive of separately billable procedures and treating other patients.  Critical care was necessary to treat or prevent imminent or life-threatening deterioration.  Critical care was time spent personally by me on the following activities: development of treatment plan with patient and/or surrogate as well as nursing, discussions with consultants, evaluation of patient's response to treatment, examination of patient, obtaining history from patient or surrogate, ordering and performing treatments and interventions, ordering and review of laboratory studies, ordering and review of radiographic studies, pulse oximetry and re-evaluation of patient's condition.  Author: Lanetta Pion, MD 04/30/2023 2:28 AM  For on call review [email or web address].

## 2023-04-30 NOTE — ED Notes (Signed)
 This RN spoke to brenda from lab to draw labs and cultures. Lab aware. States they will be down when they can.

## 2023-04-30 NOTE — ED Notes (Signed)
 Pt asked if she can have a bath. Informed pt that she is in a room that does not have a shower but asked if she would like a bed bath. Pt assisted with bed bath. Pt placed into clean gown and adjusted in bed. Call light within reach. No other needs identified at this time.

## 2023-04-30 NOTE — ED Notes (Signed)
 Secretary called to page provider for critical result.

## 2023-04-30 NOTE — Assessment & Plan Note (Addendum)
 Thrombocytosis Anemia WBC almost 30,000 with thrombocytosis Uncertain etiology, no localizing signs of infection.  UA unremarkable.  Chest x-ray clear ?leukemoid reaction Anemia panel Will get lactic acid, procalcitonin--->0.60 Follow blood cultures Consider hematology consult

## 2023-04-30 NOTE — Assessment & Plan Note (Signed)
 Patient endorses unintentional weight loss Recommend age-appropriate screening Heme-onc consult as above

## 2023-04-30 NOTE — ED Notes (Signed)
 CCMD called to initiate cardiac monitoring

## 2023-04-30 NOTE — Progress Notes (Signed)
 Progress Note   Patient: Sheila [name] ZOX:[id number] DOB: 1960-08-05 DOA: 04/29/2023     0 DOS: the patient was seen and examined on 04/30/2023   Brief hospital course:  Sheila [name] is a 63 y.o. female with medical history significant for COPD, DM, alcohol use disorder, ulcerative colitis on mesalamine , "neuromuscular disorder" with history of falls, gout, hypertension presenting with generalized weakness and falls patient had a viral illness with cough and congestion a couple weeks ago and had been recovering well until 3 days ago when she became very weak resulting in falls.  Denies lightheadedness but feels like her legs just give out when she tries to walk.  She has been staying in bed due to the weakness.  She denies chest pain, shortness of breath, fever or chills, vomiting or diarrhea or abdominal pain or dysuria.  Has cut back on her drinking since the illness.  Drinking 3-4 drinks about 3 to 4 days a week.  Endorses weight loss over the past ED course and data review: Vitals within normal limits. Labs notable for  Hypokalemia of 2.7 and hypomagnesemia of 1.1.  Metabolic acidosis with anion gap of 26 and bicarb of 13,  WBC 30,000 with thrombocytosis of 440 and anemia of 11.3 LFTs notable for elevated AST/ALT ratio of 61/39 and elevated alk phos of 197 CK normal at 93 Troponin 12 Respiratory viral panel negative Urinalysis with mild ketonuria 5.   EKG, personally reviewed and interpreted showing NSR at 98 with no acute ST-T wave changes   CTA head and neck nonacute MRI brain ordered and pending   Patient started on oral and IV potassium and IV magnesium    Hospitalist consulted for admission.    Assessment and Plan:  Alcoholic ketoacidosis, suspected Alcohol use disorder Patient presents with generalized weakness and falls, hypokalemia, hypomagnesemia, anion gap metabolic acidosis, ketonuria, 2-1 AST/ALT ratio No GI symptoms of vomiting and diarrhea Suspecting  alcoholic ketoacidosis, possibly triggered by recent cut back in drinking Continue thiamine  as well as supplemental IV fluid Continue CIWA protocol Monitor electrolyte closely   Hypomagnesemia Continue repletion and monitoring   Hypokalemia Continue repletion and monitoring   Leukocytosis of unclear etiology-improved Thrombocytosis Anemia No signs of infection at this time Antibiotics have been de-escalated awaiting culture results   Lactic acidosis Infection versus alcohol/acidosis related, lower suspicion for infection quite meeting sepsis criteria Continue IV fluid resuscitation  Generalized weakness History of falls Secondary to electrolyte derangements, possible occult infection CTA head and neck and MRI brain with no acute abnormalities Chart review reveals history of neuromuscular disorder however no specialist visits related to same, and patient denies knowledge of any such disorder Correct acute metabolic derangements PT OT consult   Ulcerative colitis (HCC) Not acutely flared Continue mesalamine    HTN (hypertension) Blood pressure on the soft side 114/59 so will hold home antihypertensives tonight of chlorthalidone , losartan , metoprolol    COPD (chronic obstructive pulmonary disease) (HCC) Not acutely exacerbated Albuterol  as needed   Alcohol use disorder For counseling on abstinence   Diabetes (HCC) Sliding scale insulin  coverage   Unintentional weight loss Patient endorses unintentional weight loss Recommend age-appropriate screening Heme-onc consult as above    DVT prophylaxis: Lovenox    Consults: none   Advance Care Planning:   Code Status: Prior    Family Communication: none   Disposition Plan: Back to previous home environment   Subjective:  Patient seen and examined at bedside this morning She admits to some improvement Still complaining of bilateral lower  extremity weakness MRI did not show any acute pathology PT OT on  board  Physical Exam: General: Not in acute distress Cardiac: S1/S2, RRR, No murmurs, No gallops or rubs. Respiratory: No rales, wheezing, rhonchi or rubs. GI: Soft, nondistended GU: No hematuria Ext: No pitting leg edema bilaterally. 1+DP/PT pulse bilaterally. Musculoskeletal: No joint deformities, No joint redness or warmth, no limitation of ROM in spin. Skin: No rashes.  Neuro: Alert, oriented X3, cranial nerves II-XII grossly intact Psych: Patient is not psychotic, no suicidal or hemocidal ideation.     Vitals:   04/30/23 0900 04/30/23 1030 04/30/23 1223 04/30/23 1405  BP: (!) 106/91 138/64  (!) 146/89  Pulse: 97 95  93  Resp:  15  18  Temp:   97.7 F (36.5 C)   TempSrc:   Oral   SpO2: 99% 100%  99%  Weight:      Height:        Data Reviewed: I have reviewed patient's chest x-ray that did not show any acute pathology, MRI of the brain did not show any acute pathology    Latest Ref Rng & Units 04/30/2023    5:22 AM 04/29/2023   10:05 PM 09/24/2021   11:45 AM  CBC  WBC 4.0 - 10.5 K/uL 19.4  29.8  9.3   Hemoglobin 12.0 - 15.0 g/dL 9.3  16.1  09.6   Hematocrit 36.0 - 46.0 % 30.0  37.3  44.4   Platelets 150 - 400 K/uL 337  440  273        Latest Ref Rng & Units 04/30/2023    5:22 AM 04/29/2023   10:05 PM 09/24/2021   11:45 AM  BMP  Glucose 70 - 99 mg/dL 045  409  811   BUN 8 - 23 mg/dL 6  7  11    Creatinine 0.44 - 1.00 mg/dL 9.14  7.82  9.56   Sodium 135 - 145 mmol/L 142  146  141   Potassium 3.5 - 5.1 mmol/L 3.4  2.7  3.7   Chloride 98 - 111 mmol/L 106  106  104   CO2 22 - 32 mmol/L 25  13  28    Calcium  8.9 - 10.3 mg/dL 7.8  8.5  9.2        Time spent: 56 minutes  Author: Ezzard Holms, MD 04/30/2023 2:30 PM  For on call review [email or web address].

## 2023-04-30 NOTE — ED Notes (Signed)
 This RN spoke to MD Dr. Brion Cancel to clarify orders for dextrose  5% LR as a bolus. Dr. Vallarie Gauze states this order is correct to be given as a bolus. Will proceed with order per Dr. Vallarie Gauze.

## 2023-04-30 NOTE — Assessment & Plan Note (Addendum)
 Alcohol use disorder Patient presents with generalized weakness and falls, hypokalemia, hypomagnesemia, anion gap metabolic acidosis, ketonuria, 2-1 AST/ALT ratio No GI symptoms of vomiting and diarrhea Suspecting alcoholic ketoacidosis, possibly triggered by recent cut back in drinking D5/LR bolus followed by bicarb in D5 IV thiamine  Replete sodium and magnesium  (outlined on the respective problem) EtOH, lactic acid ordered Serial BMP CIWA withdrawal protocol

## 2023-04-30 NOTE — Assessment & Plan Note (Signed)
 For counseling on abstinence

## 2023-04-30 NOTE — Assessment & Plan Note (Addendum)
 Suspect related to alcoholic ketoacidosis No increased diarrhea related to her ulcerative colitis Urinalysis and chest x-ray unremarkable IV magnesium  repletion ordered in the ED Continue to monitor and replete as needed

## 2023-04-30 NOTE — Assessment & Plan Note (Addendum)
 Not acutely flared Continue mesalamine 

## 2023-04-30 NOTE — Assessment & Plan Note (Addendum)
 History of falls Secondary to electrolyte derangements, possible occult infection CTA head and neck and MRI brain with no acute abnormalities Chart review reveals history of neuromuscular disorder however no specialist visits related to same, and patient denies knowledge of any such disorder Correct acute metabolic derangements PT OT consult

## 2023-04-30 NOTE — Assessment & Plan Note (Addendum)
 Suspect related to alcoholic ketoacidosis in combination with diuretic chlorthalidone  No increased diarrhea related to her ulcerative colitis Received oral and IV repletion in the ED Continue to monitor and replete as needed No EKG changes Cardiac monitoring

## 2023-04-30 NOTE — ED Notes (Signed)
 X-ray at bedside

## 2023-04-30 NOTE — Assessment & Plan Note (Signed)
 Sliding scale insulin coverage

## 2023-04-30 NOTE — Assessment & Plan Note (Signed)
Not acutely exacerbated.  Albuterol as needed 

## 2023-04-30 NOTE — Assessment & Plan Note (Signed)
 Blood pressure on the soft side 114/59 so will hold home antihypertensives tonight of chlorthalidone , losartan , metoprolol 

## 2023-04-30 NOTE — ED Notes (Signed)
 Phlebotomy at bedside.

## 2023-04-30 NOTE — Progress Notes (Signed)
 Pharmacy Antibiotic Note  Sheila [name] is a 63 y.o. female admitted on 04/29/2023 with  infection of unknown source .  Pharmacy has been consulted for Vanc, Cefepime  dosing.  Plan: Cefepime  2 gm IV Q12H ordered to start on 4/26 @ 0600.  Vancomycin  1500 mg IV X 1 ordered for 4/26 @ ~ 0700. Vancomycin  1 gm IV Q24H ordered to start on 4/27 @ ~ 0700.  AUC = 474.1 Vanc trough = 11.0   Height: 5' (152.4 cm) Weight: 61.2 kg (135 lb) IBW/kg (Calculated) : 45.5  Temp (24hrs), Avg:98 F (36.7 C), Min:97.9 F (36.6 C), Max:98 F (36.7 C)  Recent Labs  Lab 04/29/23 2205 04/30/23 0320 04/30/23 0522  WBC 29.8*  --  19.4*  CREATININE 0.52  --  0.45  LATICACIDVEN  --  4.9*  --     Estimated Creatinine Clearance: 59.6 mL/min (by C-G formula based on SCr of 0.45 mg/dL).    Allergies  Allergen Reactions   Ampicillin Anaphylaxis   Erythromycin Anaphylaxis   Omeprazole Anaphylaxis   Penicillins Anaphylaxis and Other (See Comments)    Has patient had a PCN reaction causing immediate rash, facial/tongue/throat swelling, SOB or lightheadedness with hypotension: Yes Has patient had a PCN reaction causing severe rash involving mucus membranes or skin necrosis: No Has patient had a PCN reaction that required hospitalization: Unknown Has patient had a PCN reaction occurring within the last 10 years: Yes If all of the above answers are "NO", then may proceed with Cephalosporin use.    Triamterene-Hctz Shortness Of Breath and Swelling   Quinapril Cough    Antimicrobials this admission:   >>    >>   Dose adjustments this admission:   Microbiology results: BCx:   UCx:    Sputum:    MRSA PCR:   Thank you for allowing pharmacy to be a part of this patient's care.  Arria Naim D 04/30/2023 6:00 AM

## 2023-04-30 NOTE — Discharge Instructions (Addendum)
 Intensive Outpatient Programs   [facility] [address] [place],  [place], [place] [phone]  [facility] (Inpatient and outpatient)[phone] (Suboxone and Methadone) [address] [phone]  [facility] - Intensive Outpatient [address] [place], [place] [address]  [phone]  [facility] (Outpatient, Inpatient, [facility] (Groups and Residental) (insurance only) [phone] [place], [place] [phone]   [facility] (for caregivers and family) [address] Amy Kansky [address] [place], [place] [phone]  Residential Treatment Programs  [facility](2 years) Residential: 32 days)[facility] [address] [place], [place], [place] [phone] or [phone]  [facility] [address] [place], [place], [place] [phone]  [facility] ([facility] [address] [place], [place], [place] [phone] Admissions: 8am-3pm M-F  [facility] Program: Residential Program [phone] Days)             [facility]: [place]  [facility]  [place], [place], [place]  [phone] or [phone] in Hours over the weekend or by referral)  [facility] [address] [place], [place] [address] [phone] (Do virtual or phone assessment, offer transportation within 25 miles, have in patient and Outpatient options)   Mobil Crisis: [facility]:[phone] (for crisis  response 24 hours a day)    [facility]  [address] , [place], [place], [address]. [phone] They will call you to arrange when they can come to see you

## 2023-04-30 NOTE — Evaluation (Signed)
 Occupational Therapy Evaluation Patient Details Name: Sheila [name] MRN: [id number] DOB: [birth year] Today's Date: 04/30/2023   History of Present Illness   63 y.o. female with PMHx significant for COPD, DM, alcohol use disorder, ulcerative colitis on mesalamine , "neuromuscular disorder" with history of falls, gout, and HTN, presenting with generalized weakness and falls. Pt had a viral illness with cough and congestion a couple weeks ago and had been recovering well until 3 days ago. Denies lightheadedness but feels like her legs just give out when she tries to walk.  She has been staying in bed due to the weakness.  She denies chest pain, shortness of breath, fever or chills, vomiting or diarrhea or abdominal pain or dysuria.  Has cut back on her drinking since the illness.  Drinking 3-4 drinks about 3 to 4 days a week.  Endorses unintentional weight loss.     Clinical Impressions Pt was seen for OT evaluation this date. Prior to hospital admission, pt was living alone in a 1 story ramped entrance home by herself, amb with a SPC, and required transportation services and her daughter to deliver groceries. She reports caring her father who is in a w/c 8hrs/day, M-F while her brother is at work and notes he needs significant physical assist for toileting and meals. Pt presents to acute OT demonstrating impaired ADL performance and functional mobility 2/2 7/10 SOB (VSS), BLE weakness, impaired activity tolerance and balance (See OT problem list for additional functional deficits). Pt currently requires MIN A for bed mobility, MIN A for STS transfers, BUE handheld assist for steps to the [facility], CGA in standing for pericare after setup, and MOD A for clothing mgt. Pt sounds mildly winded when speaking after exertion. RN aware. Pt would benefit from skilled OT services to address noted impairments and functional limitations (see below for any additional details) in order to maximize safety and independence  while minimizing falls risk and caregiver burden.    If plan is discharge home, recommend the following:   A little help with walking and/or transfers;A lot of help with bathing/dressing/bathroom;Assistance with cooking/housework;Assist for transportation;Help with stairs or ramp for entrance     Functional Status Assessment   Patient has had a recent decline in their functional status and demonstrates the ability to make significant improvements in function in a reasonable and predictable amount of time.     Equipment Recommendations   None recommended by OT     Recommendations for Other Services         Precautions/Restrictions   Precautions Precautions: Fall Recall of Precautions/Restrictions: Intact Restrictions Weight Bearing Restrictions Per Provider Order: No     Mobility Bed Mobility Overal bed mobility: Needs Assistance Bed Mobility: Supine to Sit, Sit to Supine     Supine to sit: Supervision, HOB elevated, Used rails Sit to supine: Min assist   General bed mobility comments: MIN A for BLE mgt back to bed    Transfers Overall transfer level: Needs assistance Equipment used: 1 person hand held assist Transfers: Sit to/from Stand Sit to Stand: Min assist           General transfer comment: no RW available, would benefit from RW next session      Balance Overall balance assessment: Needs assistance Sitting-balance support: Feet supported, No upper extremity supported Sitting balance-Leahy Scale: Fair     Standing balance support: Single extremity supported, No upper extremity supported, During functional activity, Reliant on assistive device for balance Standing balance-Leahy Scale: Fair Standing balance  comment: able to briefly let go to complete pericare and clothing mgt in standing, but otherwise requires UE vs BUE support       ADL either performed or assessed with clinical judgement   ADL Overall ADL's : Needs assistance/impaired         Lower Body Dressing: Sitting/lateral leans;Sit to/from stand;Moderate assistance Lower Body Dressing Details (indicate cue type and reason): MAX A don socks seated EOB, MOD A for LB dressing/clothing mgt after toileting Toilet Transfer: Minimal assistance;BSC/3in1 Toilet Transfer Details (indicate cue type and reason): BUE handheld support  step pivot to [facility] Toileting- Clothing Manipulation and Hygiene: Sit to/from stand;Set up;Contact guard assist;Moderate assistance Toileting - Clothing Manipulation Details (indicate cue type and reason): set up and CGA in standing for pericare; MOD A for clothing mgt          Pertinent Vitals/Pain Pain Assessment Pain Assessment: No/denies pain     Extremity/Trunk Assessment Upper Extremity Assessment Upper Extremity Assessment: Generalized weakness   Lower Extremity Assessment Lower Extremity Assessment: Generalized weakness       Communication Communication Communication: No apparent difficulties   Cognition Arousal: Alert Behavior During Therapy: WFL for tasks assessed/performed Cognition: No apparent impairments        Following commands: Intact       Cueing  General Comments   Cueing Techniques: Verbal cues  VSS, pt endorsing 7/10 SOB and does sound a bit winded after toileting and bed mobility when speaking           Home Living Family/patient expects to be discharged to:: Private residence Living Arrangements: Alone Available Help at Discharge:  (pt denies assist available) Type of Home: House Home Access: Ramped entrance     Home Layout: One level     Bathroom Shower/Tub: Producer, television/film/video: Handicapped height (BSC frame over top)     Home Equipment: Agricultural consultant (2 wheels);Cane - single point;Crutches;BSC/3in1;Shower seat;Wheelchair - manual          Prior Functioning/Environment Prior Level of Function : Needs assist;History of Falls (last six months)      Mobility  Comments: SPC for mobility, endorses 20+ falls in past 62mo due to her knees "giving out/buckling" ADLs Comments: Pt endorses indep with ADL and light IADL. Schedules transportation and daughter has groceries delivered.    OT Problem List: Decreased strength;Impaired balance (sitting and/or standing);Decreased activity tolerance;Decreased safety awareness;Decreased knowledge of use of DME or AE   OT Treatment/Interventions: Self-care/ADL training;Therapeutic exercise;Therapeutic activities;DME and/or AE instruction;Patient/family education;Balance training;Energy conservation      OT Goals(Current goals can be found in the care plan section)   Acute Rehab OT Goals Patient Stated Goal: get better OT Goal Formulation: With patient Time For Goal Achievement: 05/14/23 Potential to Achieve Goals: Good ADL Goals Pt Will Perform Lower Body Dressing: with modified independence;sit to/from stand Pt Will Transfer to Toilet: with modified independence;ambulating (LRAD) Pt Will Perform Toileting - Clothing Manipulation and hygiene: with modified independence Additional ADL Goal #1: Pt will verbalize plan to implement at least 1 learned falls prevention/EC strategy to maximize safety at home.   OT Frequency:  Min 2X/week       AM-PAC OT "6 Clicks" Daily Activity     Outcome Measure Help from another person eating meals?: None Help from another person taking care of personal grooming?: None Help from another person toileting, which includes using toliet, bedpan, or urinal?: A Little Help from another person bathing (including washing, rinsing, drying)?: A Little Help from  another person to put on and taking off regular upper body clothing?: None Help from another person to put on and taking off regular lower body clothing?: A Little 6 Click Score: 21   End of Session Nurse Communication: Other (comment) (SOB)  Activity Tolerance: Patient tolerated treatment well Patient left: in bed;with  call bell/phone within reach;with nursing/sitter in room  OT Visit Diagnosis: Repeated falls (R29.6);Muscle weakness (generalized) (M62.81);Unsteadiness on feet (R26.81)                Time: 4034-7425 OT Time Calculation (min): 18 min Charges:  OT General Charges $OT Visit: 1 Visit OT Evaluation $OT Eval Low Complexity: 1 Low OT Treatments $Self Care/Home Management : 8-22 mins  Berenda Breaker., MPH, MS, OTR/L ascom [phone] 04/30/23, 4:24 PM

## 2023-05-01 ENCOUNTER — Encounter: Payer: Self-pay | Admitting: Internal Medicine

## 2023-05-01 DIAGNOSIS — E8729 Other acidosis: Secondary | ICD-10-CM | POA: Diagnosis not present

## 2023-05-01 LAB — BASIC METABOLIC PANEL WITH GFR
Anion gap: 11 (ref 5–15)
BUN: 9 mg/dL (ref 8–23)
CO2: 24 mmol/L (ref 22–32)
Calcium: 8.2 mg/dL — ABNORMAL LOW (ref 8.9–10.3)
Chloride: 103 mmol/L (ref 98–111)
Creatinine, Ser: 0.48 mg/dL (ref 0.44–1.00)
GFR, Estimated: >60 mL/min (ref >60)
Glucose, Bld: 138 mg/dL — ABNORMAL HIGH (ref 70–99)
Potassium: 3.7 mmol/L (ref 3.5–5.1)
Sodium: 138 mmol/L (ref 135–145)

## 2023-05-01 LAB — CBC WITH DIFFERENTIAL/PLATELET
Abs Immature Granulocytes: 0.21 10*3/uL — ABNORMAL HIGH (ref 0.00–0.07)
Basophils Absolute: 0.1 10*3/uL (ref 0.0–0.1)
Basophils Relative: 1 %
Eosinophils Absolute: 0.9 10*3/uL — ABNORMAL HIGH (ref 0.0–0.5)
Eosinophils Relative: 6 %
HCT: 28.2 % — ABNORMAL LOW (ref 36.0–46.0)
Hemoglobin: 8.9 g/dL — ABNORMAL LOW (ref 12.0–15.0)
Immature Granulocytes: 1 %
Lymphocytes Relative: 19 %
Lymphs Abs: 3.2 10*3/uL (ref 0.7–4.0)
MCH: 29.3 pg (ref 26.0–34.0)
MCHC: 31.6 g/dL (ref 30.0–36.0)
MCV: 92.8 fL (ref 80.0–100.0)
Monocytes Absolute: 1.1 10*3/uL — ABNORMAL HIGH (ref 0.1–1.0)
Monocytes Relative: 7 %
Neutro Abs: 11 10*3/uL — ABNORMAL HIGH (ref 1.7–7.7)
Neutrophils Relative %: 66 %
Platelets: 296 10*3/uL (ref 150–400)
RBC: 3.04 MIL/uL — ABNORMAL LOW (ref 3.87–5.11)
RDW: 17.2 % — ABNORMAL HIGH (ref 11.5–15.5)
WBC: 16.4 10*3/uL — ABNORMAL HIGH (ref 4.0–10.5)
nRBC: 0 % (ref 0.0–0.2)

## 2023-05-01 LAB — CBG MONITORING, ED: Glucose-Capillary: 177 mg/dL — ABNORMAL HIGH (ref 70–99)

## 2023-05-01 LAB — MAGNESIUM
Magnesium: 1.4 mg/dL — ABNORMAL LOW (ref 1.7–2.4)
Magnesium: 2.2 mg/dL (ref 1.7–2.4)

## 2023-05-01 LAB — GLUCOSE, CAPILLARY
Glucose-Capillary: 201 mg/dL — ABNORMAL HIGH (ref 70–99)
Glucose-Capillary: 123 mg/dL — ABNORMAL HIGH (ref 70–99)
Glucose-Capillary: 127 mg/dL — ABNORMAL HIGH (ref 70–99)

## 2023-05-01 MED ORDER — MAGNESIUM SULFATE 2 GM/50ML IV SOLN
2.0000 g | INTRAVENOUS | Status: AC
  Administered 2023-05-01 (×2): 2 g via INTRAVENOUS
  Filled 2023-05-01 (×2): qty 50

## 2023-05-01 NOTE — Progress Notes (Signed)
 Progress Note   Patient: Sheila [name] QIO:[id number] DOB: 01-06-1960 DOA: 04/29/2023     1 DOS: the patient was seen and examined on 05/01/2023      Brief hospital course:   Sheila [name] is a 63 y.o. female with medical history significant for COPD, DM, alcohol use disorder, ulcerative colitis on mesalamine , "neuromuscular disorder" with history of falls, gout, hypertension presenting with generalized weakness and falls patient had a viral illness with cough and congestion a couple weeks ago and had been recovering well until 3 days ago when she became very weak resulting in falls.  Denies lightheadedness but feels like her legs just give out when she tries to walk.  She has been staying in bed due to the weakness.  She denies chest pain, shortness of breath, fever or chills, vomiting or diarrhea or abdominal pain or dysuria.  Has cut back on her drinking since the illness.  Drinking 3-4 drinks about 3 to 4 days a week.  Endorses weight loss over the past ED course and data review: Vitals within normal limits. Labs notable for  Hypokalemia of 2.7 and hypomagnesemia of 1.1.  Metabolic acidosis with anion gap of 26 and bicarb of 13,  WBC 30,000 with thrombocytosis of 440 and anemia of 11.3 LFTs notable for elevated AST/ALT ratio of 61/39 and elevated alk phos of 197 CK normal at 93 Troponin 12 Respiratory viral panel negative Urinalysis with mild ketonuria 5.   EKG, personally reviewed and interpreted showing NSR at 98 with no acute ST-T wave changes   CTA head and neck nonacute MRI brain ordered and pending   Patient started on oral and IV potassium and IV magnesium    Hospitalist consulted for admission.      Assessment and Plan:   Alcoholic ketoacidosis, suspected Alcohol use disorder Patient presents with generalized weakness and falls, hypokalemia, hypomagnesemia, anion gap metabolic acidosis, ketonuria, 2-1 AST/ALT ratio No GI symptoms of vomiting and  diarrhea Suspecting alcoholic ketoacidosis, possibly triggered by recent cut back in drinking Continue thiamine  as well as supplemental IV fluid Continue CIWA protocol Monitor electrolyte closely   Hypomagnesemia Continue repletion and monitoring   Hypokalemia Continue repletion and monitoring   Leukocytosis of unclear etiology-improved Thrombocytosis Anemia No signs of infection at this time Antibiotics have been de-escalated awaiting culture results   Lactic acidosis Infection versus alcohol/acidosis related, lower suspicion for infection quite meeting sepsis criteria Continue IV fluid resuscitation   Generalized weakness History of falls Secondary to electrolyte derangements, possible occult infection CTA head and neck and MRI brain with no acute abnormalities Chart review reveals history of neuromuscular disorder however no specialist visits related to same, and patient denies knowledge of any such disorder PT OT recommending rehab   Ulcerative colitis (HCC) Not acutely flared Continue mesalamine    HTN (hypertension) Blood pressure on the soft side 114/59 so will hold home antihypertensives tonight of chlorthalidone , losartan , metoprolol    COPD (chronic obstructive pulmonary disease) (HCC) Not acutely exacerbated Albuterol  as needed   Alcohol use disorder For counseling on abstinence   Diabetes (HCC) Sliding scale insulin  coverage   Unintentional weight loss Patient endorses unintentional weight loss Recommend age-appropriate screening as an outpatient      DVT prophylaxis: Lovenox    Consults: none   Advance Care Planning:   Code Status: Prior    Family Communication: none   Disposition Plan: Back to previous home environment     Subjective:  Patient seen and examined at bedside this morning Denies nausea  vomiting abdominal pain chest pain or cough Still having lower extremity weakness TOC working on placement   Physical Exam: General: Not in  acute distress Cardiac: S1/S2, RRR, No murmurs, No gallops or rubs. Respiratory: No rales, wheezing, rhonchi or rubs. GI: Soft, nondistended GU: No hematuria Ext: No pitting leg edema bilaterally. 1+DP/PT pulse bilaterally. Musculoskeletal: No joint deformities, No joint redness or warmth, no limitation of ROM in spin. Skin: No rashes.  Neuro: Alert, oriented X3, cranial nerves II-XII grossly intact Psych: Patient is not psychotic, no suicidal or hemocidal ideation.      Vitals:   05/01/23 0700 05/01/23 0807 05/01/23 0828 05/01/23 1413  BP: (!) 145/64 (!) 153/78 (!) 151/74 (!) 146/81  Pulse: 92 89 87 92  Resp: (!) 24 20 20 18   Temp:  98 F (36.7 C) 98 F (36.7 C) 97.8 F (36.6 C)  TempSrc:  Oral    SpO2: 96% 100% 100% 100%  Weight:      Height:          Latest Ref Rng & Units 05/01/2023    4:22 AM 04/30/2023    5:22 AM 04/29/2023   10:05 PM  CBC  WBC 4.0 - 10.5 K/uL 16.4  19.4  29.8   Hemoglobin 12.0 - 15.0 g/dL 8.9  9.3  19.1   Hematocrit 36.0 - 46.0 % 28.2  30.0  37.3   Platelets 150 - 400 K/uL 296  337  440      Author: Ezzard Holms, MD 05/01/2023 6:08 PM  For on call review [email or web address].

## 2023-05-01 NOTE — ED Notes (Signed)
 Advised nurse that patient has ready bed

## 2023-05-01 NOTE — ED Notes (Signed)
Pt assisted to bedside commode with one assist.

## 2023-05-01 NOTE — Evaluation (Signed)
 Physical Therapy Evaluation Patient Details Name: Sheila [name] MRN: [id number] DOB: 03-30-1960 Today's Date: 05/01/2023  History of Present Illness  63 y.o. female with PMHx significant for COPD, DM, alcohol use disorder, ulcerative colitis on mesalamine , "neuromuscular disorder" with history of falls, gout, and HTN, presenting with generalized weakness and falls. Pt had a viral illness with cough and congestion a couple weeks ago and had been recovering well until 3 days ago. Denies lightheadedness but feels like her legs just give out when she tries to walk.  She has been staying in bed due to the weakness.  She denies chest pain, shortness of breath, fever or chills, vomiting or diarrhea or abdominal pain or dysuria.  Has cut back on her drinking since the illness.  Drinking 3-4 drinks about 3 to 4 days a week.  Endorses unintentional weight loss.   Clinical Impression  Pt admitted with above diagnosis. Pt currently with functional limitations due to the deficits listed below (see PT Problem List). Pt received upright in bed agreeable to PT. Reports multiple falls due to LE's giving out but is typically independent to mod-I with SPC. Relies on family and friend to support transportation and groceries.   To date, pt is able to transfer to EOB mod-I. Pt minA+1 for STS efforts to RW with VC's for hand placement with poor carryover and posterior tipping of RW. Completes 2x20' gait in room that is laborious and heavy UE support needed to complete at [facility]. Seated rest provided with mild SOB reported. Reports need for toileting and stands at [facility] to RW with improved carryover with form/technique. Ambulates 20' to bathroom and left in care of RN on commode. Pt displays deficits in safe use of DME, muscle weakness, and severe history of falls. Pt will benefit from skilled PT services < 3 hours/day to reduce falls risk and maximize return to PLOF.       If plan is discharge home, recommend the following: A  little help with walking and/or transfers;Assistance with cooking/housework;Assist for transportation;Help with stairs or ramp for entrance;A little help with bathing/dressing/bathroom   Can travel by private vehicle   Yes    Equipment Recommendations None recommended by PT  Recommendations for Other Services       Functional Status Assessment Patient has had a recent decline in their functional status and demonstrates the ability to make significant improvements in function in a reasonable and predictable amount of time.     Precautions / Restrictions Precautions Precautions: Fall Recall of Precautions/Restrictions: Intact Restrictions Weight Bearing Restrictions Per Provider Order: No      Mobility  Bed Mobility Overal bed mobility: Modified Independent Bed Mobility: Supine to Sit             Patient Response: Cooperative  Transfers Overall transfer level: Needs assistance Equipment used: Rolling walker (2 wheels) Transfers: Sit to/from Stand Sit to Stand: Contact guard assist, Min assist           General transfer comment: Ranges from minA to CGA for STS efforts and VC's for hand placement with RW    Ambulation/Gait Ambulation/Gait assistance: Contact guard assist Gait Distance (Feet): 60 Feet (2x20' with seated rest. 20' to bathroom.) Assistive device: Rolling walker (2 wheels) Gait Pattern/deviations: Step-through pattern, Decreased step length - right, Decreased step length - left, Trunk flexed       General Gait Details: heavy BUE support needed on [employer]  Mobility     Tilt Bed Tilt Bed Patient Response: Cooperative  Modified Rankin (Stroke Patients Only)       Balance Overall balance assessment: Needs assistance Sitting-balance support: Feet supported, No upper extremity supported Sitting balance-Leahy Scale: Fair     Standing balance support: Bilateral upper extremity supported, During functional  activity Standing balance-Leahy Scale: Fair                               Pertinent Vitals/Pain Pain Assessment Pain Assessment: Faces Faces Pain Scale: Hurts a little bit Pain Location: B plantar surface of feet Pain Descriptors / Indicators: Burning, Tingling, Numbness Pain Intervention(s): Limited activity within patient's tolerance, Monitored during session, Repositioned    Home Living Family/patient expects to be discharged to:: Private residence Living Arrangements: Alone   Type of Home: House Home Access: Ramped entrance       Home Layout: One level Home Equipment: Agricultural consultant (2 wheels);Cane - single point;Crutches;BSC/3in1;Shower seat;Wheelchair - manual      Prior Function Prior Level of Function : Needs assist;History of Falls (last six months)             Mobility Comments: SPC for mobility, endorses 20+ falls in past 49mo due to her knees "giving out/buckling" ADLs Comments: Pt endorses indep with ADL and light IADL. Schedules transportation and daughter has groceries delivered.     Extremity/Trunk Assessment   Upper Extremity Assessment Upper Extremity Assessment: Generalized weakness    Lower Extremity Assessment Lower Extremity Assessment: Generalized weakness       Communication   Communication Communication: No apparent difficulties    Cognition Arousal: Alert Behavior During Therapy: WFL for tasks assessed/performed   PT - Cognitive impairments: No apparent impairments                         Following commands: Intact       Cueing Cueing Techniques: Verbal cues     General Comments General comments (skin integrity, edema, etc.): HR 100-103 BPM at rest and with mobility. Mild SOB post gait.    Exercises     Assessment/Plan    PT Assessment Patient needs continued PT services  PT Problem List Decreased strength;Decreased safety awareness;Obesity;Decreased activity tolerance;Decreased balance;Decreased  knowledge of use of DME       PT Treatment Interventions DME instruction;Therapeutic exercise;Gait training;Balance training;Stair training;Neuromuscular re-education;Functional mobility training;Therapeutic activities;Patient/family education    PT Goals (Current goals can be found in the Care Plan section)  Acute Rehab PT Goals Patient Stated Goal: improve her LE strength PT Goal Formulation: With patient Time For Goal Achievement: 05/15/23 Potential to Achieve Goals: Good    Frequency Min 2X/week     Co-evaluation               AM-PAC PT "6 Clicks" Mobility  Outcome Measure Help needed turning from your back to your side while in a flat bed without using bedrails?: A Little Help needed moving from lying on your back to sitting on the side of a flat bed without using bedrails?: A Little Help needed moving to and from a bed to a chair (including a wheelchair)?: A Lot Help needed standing up from a chair using your arms (e.g., wheelchair or bedside chair)?: A Lot Help needed to walk in hospital room?: A Little Help needed climbing 3-5 steps with a railing? : A Lot 6 Click Score: 15  End of Session Equipment Utilized During Treatment: Gait belt Activity Tolerance: Patient tolerated treatment well Patient left: with nursing/sitter in room (on commode in bathroom) Nurse Communication: Mobility status PT Visit Diagnosis: Other abnormalities of gait and mobility (R26.89);Muscle weakness (generalized) (M62.81);History of falling (Z91.81);Difficulty in walking, not elsewhere classified (R26.2)    Time: 4098-1191 PT Time Calculation (min) (ACUTE ONLY): 23 min   Charges:   PT Evaluation $PT Eval Low Complexity: 1 Low PT Treatments $Gait Training: 8-22 mins PT General Charges $$ ACUTE PT VISIT: 1 Visit         Marc Senior. Fairly IV, PT, DPT Physical Therapist- [place]  [facility]  05/01/2023, 12:04 PM

## 2023-05-02 DIAGNOSIS — E8729 Other acidosis: Secondary | ICD-10-CM | POA: Diagnosis not present

## 2023-05-02 LAB — CBC WITH DIFFERENTIAL/PLATELET
Abs Immature Granulocytes: 0.14 10*3/uL — ABNORMAL HIGH (ref 0.00–0.07)
Basophils Absolute: 0.1 10*3/uL (ref 0.0–0.1)
Basophils Relative: 1 %
Eosinophils Absolute: 0.9 10*3/uL — ABNORMAL HIGH (ref 0.0–0.5)
Eosinophils Relative: 6 %
HCT: 28.5 % — ABNORMAL LOW (ref 36.0–46.0)
Hemoglobin: 9.3 g/dL — ABNORMAL LOW (ref 12.0–15.0)
Immature Granulocytes: 1 %
Lymphocytes Relative: 18 %
Lymphs Abs: 2.8 10*3/uL (ref 0.7–4.0)
MCH: 29.8 pg (ref 26.0–34.0)
MCHC: 32.6 g/dL (ref 30.0–36.0)
MCV: 91.3 fL (ref 80.0–100.0)
Monocytes Absolute: 1.1 10*3/uL — ABNORMAL HIGH (ref 0.1–1.0)
Monocytes Relative: 7 %
Neutro Abs: 11.1 10*3/uL — ABNORMAL HIGH (ref 1.7–7.7)
Neutrophils Relative %: 67 %
Platelets: 333 10*3/uL (ref 150–400)
RBC: 3.12 MIL/uL — ABNORMAL LOW (ref 3.87–5.11)
RDW: 16.9 % — ABNORMAL HIGH (ref 11.5–15.5)
WBC: 16.1 10*3/uL — ABNORMAL HIGH (ref 4.0–10.5)
nRBC: 0 % (ref 0.0–0.2)

## 2023-05-02 LAB — BASIC METABOLIC PANEL WITH GFR
Anion gap: 11 (ref 5–15)
BUN: 6 mg/dL — ABNORMAL LOW (ref 8–23)
CO2: 27 mmol/L (ref 22–32)
Calcium: 8.6 mg/dL — ABNORMAL LOW (ref 8.9–10.3)
Chloride: 104 mmol/L (ref 98–111)
Creatinine, Ser: 0.41 mg/dL — ABNORMAL LOW (ref 0.44–1.00)
GFR, Estimated: >60 mL/min (ref >60)
Glucose, Bld: 141 mg/dL — ABNORMAL HIGH (ref 70–99)
Potassium: 3.4 mmol/L — ABNORMAL LOW (ref 3.5–5.1)
Sodium: 142 mmol/L (ref 135–145)

## 2023-05-02 LAB — GLUCOSE, CAPILLARY
Glucose-Capillary: 146 mg/dL — ABNORMAL HIGH (ref 70–99)
Glucose-Capillary: 171 mg/dL — ABNORMAL HIGH (ref 70–99)
Glucose-Capillary: 128 mg/dL — ABNORMAL HIGH (ref 70–99)
Glucose-Capillary: 145 mg/dL — ABNORMAL HIGH (ref 70–99)

## 2023-05-02 MED ORDER — POTASSIUM CHLORIDE CRYS ER 20 MEQ PO TBCR
40.0000 meq | EXTENDED_RELEASE_TABLET | ORAL | Status: AC
  Administered 2023-05-02 (×2): 40 meq via ORAL
  Filled 2023-05-02 (×2): qty 2

## 2023-05-02 NOTE — Plan of Care (Signed)
   Problem: Coping: Goal: Ability to adjust to condition or change in health will improve Outcome: Progressing   Problem: Health Behavior/Discharge Planning: Goal: Ability to manage health-related needs will improve Outcome: Progressing

## 2023-05-02 NOTE — TOC Initial Note (Signed)
 Transition of Care [facility]) - Initial/Assessment Note    Patient Details  Name: Sheila [name] MRN: [id number] Date of Birth: 12/19/1960  [facility] CM/SW Contact:    Alexandra Ice, RN Phone Number: 05/02/2023, 12:57 PM  Clinical Narrative:                 Met with patient at bedside, agreeable to SNF for rehab after hospitalization.  PASRR, FL2 and bedsearch completed. Patient pending bed offers.   Expected Discharge Plan: Skilled Nursing Facility Barriers to Discharge: Continued Medical Work up   Patient Goals and CMS Choice Patient states their goals for this hospitalization and ongoing recovery are:: get stronger          Expected Discharge Plan and Services       Living arrangements for the past 2 months: Single Family Home                                      Prior Living Arrangements/Services Living arrangements for the past 2 months: Single Family Home Lives with:: Self Patient language and need for interpreter reviewed:: Yes Do you feel safe going back to the place where you live?: Yes      Need for Family Participation in Patient Care: No (Comment) Care giver support system in place?: No (comment) Current home services: DME (Rolling Walker (2 wheels);Cane - single point;Crutches;BSC/3in1;Shower seat;Wheelchair - manual) Criminal Activity/Legal Involvement Pertinent to Current Situation/Hospitalization: No - Comment as needed  Activities of Daily Living   ADL Screening (condition at time of admission) Independently performs ADLs?: Yes (appropriate for developmental age) Is the patient deaf or have difficulty hearing?: No Does the patient have difficulty seeing, even when wearing glasses/contacts?: No Does the patient have difficulty concentrating, remembering, or making decisions?: No  Permission Sought/Granted                  Emotional Assessment Appearance:: Appears stated age Attitude/Demeanor/Rapport:  Engaged Affect (typically observed): Accepting, Appropriate Orientation: : Oriented to Self, Oriented to Place, Oriented to  Time, Oriented to Situation   Psych Involvement: No (comment)  Admission diagnosis:  Hypokalemia [E87.6] Alcoholic ketoacidosis [E87.29] Hypomagnesemia [E83.42] Left leg weakness [R29.898] Leg weakness, bilateral [R29.898] Patient Active Problem List   Diagnosis Date Noted   Hypomagnesemia 04/30/2023   Alcoholic ketoacidosis, suspected 04/30/2023   Generalized weakness 04/30/2023   Personal history of falls 04/30/2023   Unintentional weight loss 04/30/2023   Ulcerative colitis (HCC)    COPD (chronic obstructive pulmonary disease) (HCC)    Neuromuscular disorder (HCC)    Closed displaced supracondylar fracture of distal end of left femur without intracondylar extension (HCC) 02/04/2019   Femur fracture (HCC) 02/04/2019   Leukocytosis 02/04/2019   Alcohol use 02/04/2019   Right leg weakness 02/14/2018   Elevated bilirubin 02/14/2018   SIRS (systemic inflammatory response syndrome) (HCC) 01/30/2018   Hypoglycemia 01/30/2018   Lactic acidosis 01/30/2018   Alcohol use disorder 01/30/2018   Abdominal pain, chronic, epigastric    Pancreatitis 08/29/2017   Diabetes (HCC) 08/29/2017   HTN (hypertension) 08/29/2017   HLD (hyperlipidemia) 08/29/2017   GERD (gastroesophageal reflux disease) 08/29/2017   Hypokalemia 08/29/2017   Hypocalcemia 08/29/2017   Primary osteoarthritis of right knee 01/14/2015   PCP:  Almetta Armor, MD Pharmacy:   [facility] [phone] - [address], [place] - [address] [place] [address] Phone: [phone] Fax:  [phone]  [facility] [address] [place] [address] Phone: [phone] Fax: [phone]     Social Drivers of Health (SDOH) Social History: SDOH Screenings   Food Insecurity: No Food Insecurity (04/30/2023)  Housing: Low Risk  (04/30/2023)   Transportation Needs: No Transportation Needs (04/30/2023)  Utilities: Not At Risk (04/30/2023)  Social Connections: Unknown (04/30/2023)  Tobacco Use: Medium Risk (05/01/2023)   SDOH Interventions:     Readmission Risk Interventions     No data to display

## 2023-05-02 NOTE — Progress Notes (Signed)
 Progress Note   Patient: Sheila [name]:[id number] DOB: [birth year] DOA: 04/29/2023     2 DOS: the patient was seen and examined on 05/02/2023     Brief hospital course:   Sheila [name] is a 64 y.o. female with medical history significant for COPD, DM, alcohol use disorder, ulcerative colitis on mesalamine , "neuromuscular disorder" with history of falls, gout, hypertension presenting with generalized weakness and falls patient had a viral illness with cough and congestion a couple weeks ago and had been recovering well until 3 days ago when she became very weak resulting in falls.  Denies lightheadedness but feels like her legs just give out when she tries to walk.  She has been staying in bed due to the weakness.  CT scan and MRI of the brain did not show any acute intracranial pathology.  Patient found to have severe electrolyte derangement including low potassium and hypomagnesemia which have improved.  Patient seen by PT OT with recommendation for rehab placement.    Assessment and Plan:   Alcoholic ketoacidosis, suspected Alcohol use disorder Patient presents with generalized weakness and falls, hypokalemia, hypomagnesemia, anion gap metabolic acidosis, ketonuria, 2-1 AST/ALT ratio No GI symptoms of vomiting and diarrhea Suspecting alcoholic ketoacidosis, possibly triggered by recent cut back in drinking Continue thiamine  as well as supplemental IV fluid Continue CIWA protocol Monitor electrolyte closely   Hypomagnesemia Continue repletion and monitoring   Hypokalemia Continue repletion and monitoring   Leukocytosis of unclear etiology-improved Thrombocytosis-improved Anemia No signs of infection at this time Antibiotics have been de-escalated awaiting culture results   Lactic acidosis-improved after rehydration   Generalized weakness History of falls Ambulatory dysfunction Secondary to electrolyte derangements, possible occult infection CTA head and neck and  MRI brain with no acute abnormalities Chart review reveals history of neuromuscular disorder however no specialist visits related to same, and patient denies knowledge of any such disorder PT OT recommending rehab Folic acid  and vitamin B12 levels within normal limit   Ulcerative colitis (HCC) Not acutely flared Continue mesalamine    HTN (hypertension) Blood pressure on the soft side 114/59 so will hold home antihypertensives tonight of chlorthalidone , losartan , metoprolol    COPD (chronic obstructive pulmonary disease) (HCC) Not acutely exacerbated Albuterol  as needed   Alcohol use disorder For counseling on abstinence   Diabetes (HCC) Sliding scale insulin  coverage   Unintentional weight loss Patient endorses unintentional weight loss Recommend age-appropriate screening as an outpatient       DVT prophylaxis: Lovenox    Consults: none   Advance Care Planning:   Code Status: Prior    Family Communication: none   Disposition Plan: Pending rehab placement, patient medically ready     Subjective:  Patient seen and examined at bedside this morning Denies any acute overnight complaint Currently awaiting placement   Physical Exam: General: Not in acute distress Cardiac: S1/S2, RRR, No murmurs, No gallops or rubs. Respiratory: No rales, wheezing, rhonchi or rubs. GI: Soft, nondistended GU: No hematuria Ext: No pitting leg edema bilaterally. 1+DP/PT pulse bilaterally. Musculoskeletal: No joint deformities, No joint redness or warmth, no limitation of ROM in spin.  Power of 3+ in bilateral lower extremities Skin: No rashes.  Neuro: Alert, oriented X3, cranial nerves II-XII grossly intact Psych: Patient is not psychotic, no suicidal or hemocidal ideation.        Latest Ref Rng & Units 05/02/2023    6:04 AM 05/01/2023    4:22 AM 04/30/2023    5:22 AM  BMP  Glucose 70 -  99 mg/dL 425  956  387   BUN 8 - 23 mg/dL 6  9  6    Creatinine 0.44 - 1.00 mg/dL 5.64  3.32  9.51    Sodium 135 - 145 mmol/L 142  138  142   Potassium 3.5 - 5.1 mmol/L 3.4  3.7  3.4   Chloride 98 - 111 mmol/L 104  103  106   CO2 22 - 32 mmol/L 27  24  25    Calcium  8.9 - 10.3 mg/dL 8.6  8.2  7.8      Vitals:   05/01/23 1944 05/02/23 0416 05/02/23 0825 05/02/23 1512  BP: (!) 141/74 (!) 144/86 (!) 154/79 (!) 153/96  Pulse: 93 89 89 91  Resp: 18 18 18 17   Temp: 98.1 F (36.7 C) 97.8 F (36.6 C) 98.5 F (36.9 C) 98.4 F (36.9 C)  TempSrc:  Oral    SpO2: 98% 97% 98% 98%  Weight:      Height:          Latest Ref Rng & Units 05/02/2023    6:04 AM 05/01/2023    4:22 AM 04/30/2023    5:22 AM  CBC  WBC 4.0 - 10.5 K/uL 16.1  16.4  19.4   Hemoglobin 12.0 - 15.0 g/dL 9.3  8.9  9.3   Hematocrit 36.0 - 46.0 % 28.5  28.2  30.0   Platelets 150 - 400 K/uL 333  296  337      Author: Ezzard Holms, MD 05/02/2023 5:36 PM  For on call review [email or web address].

## 2023-05-02 NOTE — [place] FL2 (Signed)
 [place]  MEDICAID FL2 LEVEL OF CARE FORM     IDENTIFICATION  Patient Name: Sheila [name] Birthdate: January 20, 1960 Sex: female Admission Date (Current Location): 04/29/2023  [facility] [place] Number:  Chiropodist and Address:  [facility], [address], [place], [place] [address]      Provider Number: [id number]  Attending Physician Name and Address:  Ezzard Holms, MD  Relative Name and Phone Number:  Emmylou Crecelius, daughter, [phone]    Current Level of Care: Hospital Recommended Level of Care: Skilled Nursing Facility Prior Approval Number:    Date Approved/Denied:   PASRR Number: [id number]  Discharge Plan: SNF    Current Diagnoses: Patient Active Problem List   Diagnosis Date Noted   Hypomagnesemia 04/30/2023   Alcoholic ketoacidosis, suspected 04/30/2023   Generalized weakness 04/30/2023   Personal history of falls 04/30/2023   Unintentional weight loss 04/30/2023   Ulcerative colitis (HCC)    COPD (chronic obstructive pulmonary disease) (HCC)    Neuromuscular disorder (HCC)    Closed displaced supracondylar fracture of distal end of left femur without intracondylar extension (HCC) 02/04/2019   Femur fracture (HCC) 02/04/2019   Leukocytosis 02/04/2019   Alcohol use 02/04/2019   Right leg weakness 02/14/2018   Elevated bilirubin 02/14/2018   SIRS (systemic inflammatory response syndrome) (HCC) 01/30/2018   Hypoglycemia 01/30/2018   Lactic acidosis 01/30/2018   Alcohol use disorder 01/30/2018   Abdominal pain, chronic, epigastric    Pancreatitis 08/29/2017   Diabetes (HCC) 08/29/2017   HTN (hypertension) 08/29/2017   HLD (hyperlipidemia) 08/29/2017   GERD (gastroesophageal reflux disease) 08/29/2017   Hypokalemia 08/29/2017   Hypocalcemia 08/29/2017   Primary osteoarthritis of right knee 01/14/2015    Orientation RESPIRATION BLADDER Height & Weight     Self, Time, Situation, Place  Normal Continent  Weight: 61.2 kg Height:  5' (152.4 cm)  BEHAVIORAL SYMPTOMS/MOOD NEUROLOGICAL BOWEL NUTRITION STATUS      Continent Diet (Heart healthy, carb modified)  AMBULATORY STATUS COMMUNICATION OF NEEDS Skin   Limited Assist Verbally Normal                       Personal Care Assistance Level of Assistance  Bathing, Dressing Bathing Assistance: Limited assistance   Dressing Assistance: Limited assistance     Functional Limitations Info             SPECIAL CARE FACTORS FREQUENCY  PT (By licensed PT), OT (By licensed OT)     PT Frequency: 5 times per week OT Frequency: 5 times per week            Contractures Contractures Info: Not present    Additional Factors Info  Code Status, Allergies Code Status Info: Full code Allergies Info: Omeprazole, Ampicillin, Erythromycin, Penicillin, Quinapril, Traimterene-HCTZ           Current Medications (05/02/2023):  This is the current hospital active medication list Current Facility-Administered Medications  Medication Dose Route Frequency Provider Last Rate Last Admin   acetaminophen  (TYLENOL ) tablet 650 mg  650 mg Oral Q6H PRN Duncan, Hazel V, MD   650 mg at 05/01/23 1702   Or   acetaminophen  (TYLENOL ) suppository 650 mg  650 mg Rectal Q6H PRN Duncan, Hazel V, MD       cefTRIAXone (ROCEPHIN) 2 g in sodium chloride  0.9 % 100 mL IVPB  2 g Intravenous Q24H Alvenia Aus T, MD 200 mL/hr at 05/01/23 1851 2 g at 05/01/23 1851   enoxaparin  (  LOVENOX ) injection 40 mg  40 mg Subcutaneous Q24H Brion Cancel V, MD   40 mg at 05/02/23 4696   folic acid  (FOLVITE ) tablet 1 mg  1 mg Oral Daily Duncan, Hazel V, MD   1 mg at 05/02/23 2952   insulin  aspart (novoLOG ) injection 0-5 Units  0-5 Units Subcutaneous QHS Duncan, Hazel V, MD       insulin  aspart (novoLOG ) injection 0-9 Units  0-9 Units Subcutaneous TID WC Duncan, Hazel V, MD   1 Units at 05/02/23 8413   ketorolac  (TORADOL ) 30 MG/ML injection 30 mg  30 mg Intravenous Q6H PRN Duncan, Hazel V,  MD   30 mg at 05/01/23 0236   LORazepam  (ATIVAN ) tablet 1-4 mg  1-4 mg Oral Q1H PRN Duncan, Hazel V, MD       Or   LORazepam  (ATIVAN ) injection 1-4 mg  1-4 mg Intravenous Q1H PRN Duncan, Hazel V, MD       multivitamin with minerals tablet 1 tablet  1 tablet Oral Daily Lanetta Pion, MD   1 tablet at 05/02/23 2440   nicotine (NICODERM CQ - dosed in mg/24 hours) patch 14 mg  14 mg Transdermal Daily Duncan, Hazel V, MD   14 mg at 05/02/23 1027   ondansetron  (ZOFRAN ) tablet 4 mg  4 mg Oral Q6H PRN Duncan, Hazel V, MD       Or   ondansetron  (ZOFRAN ) injection 4 mg  4 mg Intravenous Q6H PRN Duncan, Hazel V, MD       oxyCODONE  (Oxy IR/ROXICODONE ) immediate release tablet 5 mg  5 mg Oral Q4H PRN Duncan, Hazel V, MD       potassium chloride  SA (KLOR-CON  M) CR tablet 40 mEq  40 mEq Oral Q4H Djan, Prince T, MD   40 mEq at 05/02/23 2536   thiamine  (VITAMIN B1) tablet 100 mg  100 mg Oral Daily Duncan, Hazel V, MD   100 mg at 05/02/23 6440   Or   thiamine  (VITAMIN B1) injection 100 mg  100 mg Intravenous Daily Duncan, Hazel V, MD         Discharge Medications: Please see discharge summary for a list of discharge medications.  Relevant Imaging Results:  Relevant Lab Results:   Additional Information [id number]  Alexandra Ice, RN

## 2023-05-02 NOTE — Plan of Care (Signed)
  Problem: Coping: Goal: Ability to adjust to condition or change in health will improve Outcome: Progressing   Problem: Nutritional: Goal: Maintenance of adequate nutrition will improve Outcome: Progressing   Problem: Clinical Measurements: Goal: Diagnostic test results will improve Outcome: Progressing   Problem: Activity: Goal: Risk for activity intolerance will decrease Outcome: Progressing   Problem: Nutrition: Goal: Adequate nutrition will be maintained Outcome: Progressing   Problem: Coping: Goal: Level of anxiety will decrease Outcome: Progressing   Problem: Pain Managment: Goal: General experience of comfort will improve and/or be controlled Outcome: Progressing

## 2023-05-03 DIAGNOSIS — E8729 Other acidosis: Secondary | ICD-10-CM | POA: Diagnosis not present

## 2023-05-03 LAB — BASIC METABOLIC PANEL WITH GFR
Anion gap: 11 (ref 5–15)
BUN: <5 mg/dL — ABNORMAL LOW (ref 8–23)
CO2: 24 mmol/L (ref 22–32)
Calcium: 8.9 mg/dL (ref 8.9–10.3)
Chloride: 105 mmol/L (ref 98–111)
Creatinine, Ser: 0.31 mg/dL — ABNORMAL LOW (ref 0.44–1.00)
GFR, Estimated: >60 mL/min (ref >60)
Glucose, Bld: 137 mg/dL — ABNORMAL HIGH (ref 70–99)
Potassium: 3.7 mmol/L (ref 3.5–5.1)
Sodium: 140 mmol/L (ref 135–145)

## 2023-05-03 LAB — GLUCOSE, CAPILLARY
Glucose-Capillary: 153 mg/dL — ABNORMAL HIGH (ref 70–99)
Glucose-Capillary: 122 mg/dL — ABNORMAL HIGH (ref 70–99)
Glucose-Capillary: 141 mg/dL — ABNORMAL HIGH (ref 70–99)
Glucose-Capillary: 115 mg/dL — ABNORMAL HIGH (ref 70–99)

## 2023-05-03 LAB — CBC WITH DIFFERENTIAL/PLATELET
Abs Immature Granulocytes: 0.18 10*3/uL — ABNORMAL HIGH (ref 0.00–0.07)
Basophils Absolute: 0.1 10*3/uL (ref 0.0–0.1)
Basophils Relative: 1 %
Eosinophils Absolute: 1 10*3/uL — ABNORMAL HIGH (ref 0.0–0.5)
Eosinophils Relative: 6 %
HCT: 28.6 % — ABNORMAL LOW (ref 36.0–46.0)
Hemoglobin: 9.6 g/dL — ABNORMAL LOW (ref 12.0–15.0)
Immature Granulocytes: 1 %
Lymphocytes Relative: 20 %
Lymphs Abs: 3.2 10*3/uL (ref 0.7–4.0)
MCH: 30.1 pg (ref 26.0–34.0)
MCHC: 33.6 g/dL (ref 30.0–36.0)
MCV: 89.7 fL (ref 80.0–100.0)
Monocytes Absolute: 1.3 10*3/uL — ABNORMAL HIGH (ref 0.1–1.0)
Monocytes Relative: 8 %
Neutro Abs: 10.6 10*3/uL — ABNORMAL HIGH (ref 1.7–7.7)
Neutrophils Relative %: 64 %
Platelets: 366 10*3/uL (ref 150–400)
RBC: 3.19 MIL/uL — ABNORMAL LOW (ref 3.87–5.11)
RDW: 16.9 % — ABNORMAL HIGH (ref 11.5–15.5)
WBC: 16.4 10*3/uL — ABNORMAL HIGH (ref 4.0–10.5)
nRBC: 0 % (ref 0.0–0.2)

## 2023-05-03 LAB — CULTURE, BLOOD (ROUTINE X 2)

## 2023-05-03 NOTE — Progress Notes (Addendum)
 Progress Note   Patient: Sheila [name] FAO:[id number] DOB: 01/08/1960 DOA: 04/29/2023     3 DOS: the patient was seen and examined on 05/03/2023   Brief hospital course:   Sheila [name] is a 63 y.o. female with medical history significant for COPD, DM, alcohol use disorder, ulcerative colitis on mesalamine , "neuromuscular disorder" with history of falls, gout, hypertension presenting with generalized weakness and falls patient had a viral illness with cough and congestion a couple weeks ago and had been recovering well until 3 days ago when she became very weak resulting in falls.  Denies lightheadedness but feels like her legs just give out when she tries to walk.  She has been staying in bed due to the weakness.  CT scan and MRI of the brain did not show any acute intracranial pathology.  Patient found to have severe electrolyte derangement including low potassium and hypomagnesemia which have improved.  Patient seen by PT OT with recommendation for rehab placement.     Assessment and Plan:   Alcoholic ketoacidosis, suspected Alcohol use disorder Patient presents with generalized weakness and falls, hypokalemia, hypomagnesemia, anion gap metabolic acidosis, ketonuria, 2-1 AST/ALT ratio No GI symptoms of vomiting and diarrhea Suspecting alcoholic ketoacidosis, possibly triggered by recent cut back in drinking Continue thiamine   Continue CIWA protocol Monitor electrolyte closely   Hypomagnesemia-improved Monitor as needed   Hypokalemia-resolved Continue repletion and monitoring   Leukocytosis of unclear etiology-improved Thrombocytosis-improved Normocytic anemia No signs of infection at this time Antibiotics have been de-escalated awaiting final culture results Could potentially discontinue antibiotics after 5 days course if final culture results shows no growth   Lactic acidosis-improved after rehydration   Generalized weakness History of falls Ambulatory  dysfunction Secondary to electrolyte derangements, possible occult infection CTA head and neck and MRI brain with no acute abnormalities Chart review reveals history of neuromuscular disorder however no specialist visits related to same, and patient denies knowledge of any such disorder PT OT recommending rehab Folic acid  and vitamin B12 levels within normal limit   Ulcerative colitis (HCC) Not acutely flared Continue mesalamine    HTN (hypertension) Blood pressure on the soft side 114/59 so will hold home antihypertensives tonight of chlorthalidone , losartan , metoprolol    COPD (chronic obstructive pulmonary disease) (HCC) Not acutely exacerbated Albuterol  as needed   Alcohol use disorder For counseling on abstinence   Diabetes (HCC) Sliding scale insulin  coverage   Unintentional weight loss Patient endorses unintentional weight loss Recommend age-appropriate screening as an outpatient       DVT prophylaxis: Lovenox    Consults: none   Advance Care Planning:   Code Status: Prior    Family Communication: none   Disposition Plan: Pending rehab placement, patient medically ready     Subjective:  Patient seen and examined at bedside this morning Patient denies any acute overnight events Lower extremity weakness improving   Physical Exam: General: Not in acute distress Cardiac: S1/S2, RRR, No murmurs, No gallops or rubs. Respiratory: No rales, wheezing, rhonchi or rubs. GI: Soft, nondistended GU: No hematuria Ext: No pitting leg edema bilaterally. 1+DP/PT pulse bilaterally. Musculoskeletal: No joint deformities, No joint redness or warmth, no limitation of ROM in spin.  Power of 3+ in bilateral lower extremities Skin: No rashes.  Neuro: Alert, oriented X3, cranial nerves II-XII grossly intact Psych: Patient is not psychotic, no suicidal or hemocidal ideation.      Vitals:   05/03/23 0423 05/03/23 0825 05/03/23 1223 05/03/23 1518  BP: 139/82 (!) 143/104 (!)  144/71 Aaron Aas)  147/81  Pulse: 93 85 86 90  Resp: 18 18  18   Temp: 97.9 F (36.6 C) 98.1 F (36.7 C)  98 F (36.7 C)  TempSrc: Oral Oral  Oral  SpO2: 93% 100%  99%  Weight:      Height:        Data Reviewed:     Latest Ref Rng & Units 05/03/2023    4:41 AM 05/02/2023    6:04 AM 05/01/2023    4:22 AM  CBC  WBC 4.0 - 10.5 K/uL 16.4  16.1  16.4   Hemoglobin 12.0 - 15.0 g/dL 9.6  9.3  8.9   Hematocrit 36.0 - 46.0 % 28.6  28.5  28.2   Platelets 150 - 400 K/uL 366  333  296        Latest Ref Rng & Units 05/03/2023    4:41 AM 05/02/2023    6:04 AM 05/01/2023    4:22 AM  BMP  Glucose 70 - 99 mg/dL 413  244  010   BUN 8 - 23 mg/dL 5  6  9    Creatinine 0.44 - 1.00 mg/dL 2.72  5.36  6.44   Sodium 135 - 145 mmol/L 140  142  138   Potassium 3.5 - 5.1 mmol/L 3.7  3.4  3.7   Chloride 98 - 111 mmol/L 105  104  103   CO2 22 - 32 mmol/L 24  27  24    Calcium  8.9 - 10.3 mg/dL 8.9  8.6  8.2       Author: Ezzard Holms, MD 05/03/2023 5:49 PM  For on call review [email or web address].

## 2023-05-03 NOTE — Progress Notes (Signed)
 Physical Therapy Treatment Patient Details Name: Sheila [name] MRN: [id number] DOB: May 02, 1960 Today's Date: 05/03/2023   History of Present Illness 63 y.o. female with PMHx significant for COPD, DM, alcohol use disorder, ulcerative colitis on mesalamine , "neuromuscular disorder" with history of falls, gout, and HTN, presenting with generalized weakness and falls. Pt had a viral illness with cough and congestion a couple weeks ago and had been recovering well until 3 days ago. Denies lightheadedness but feels like her legs just give out when she tries to walk.  She has been staying in bed due to the weakness.  She denies chest pain, shortness of breath, fever or chills, vomiting or diarrhea or abdominal pain or dysuria.  Has cut back on her drinking since the illness.  Drinking 3-4 drinks about 3 to 4 days a week.  Endorses unintentional weight loss.    PT Comments  Patient agreeable to PT. She required CGA for transfers from various surfaces including bed and bed side commode. CGA provided for short distance ambulation with rolling walker with activity tolerance limited by fatigue and dizziness. Discharge recommendations remain appropriate at this time. PT will continue to follow to maximize independence and facilitate return to prior level of function.    If plan is discharge home, recommend the following: A little help with walking and/or transfers;Assistance with cooking/housework;Assist for transportation;Help with stairs or ramp for entrance;A little help with bathing/dressing/bathroom   Can travel by private vehicle     Yes  Equipment Recommendations  None recommended by PT    Recommendations for Other Services       Precautions / Restrictions Precautions Precautions: Fall Recall of Precautions/Restrictions: Intact Restrictions Weight Bearing Restrictions Per Provider Order: No     Mobility  Bed Mobility Overal bed mobility: Needs Assistance Bed Mobility: Supine to Sit      Supine to sit: Supervision, HOB elevated, Used rails Sit to supine: Min assist   General bed mobility comments: assistance for LE support to return to bed    Transfers Overall transfer level: Needs assistance Equipment used: Rolling walker (2 wheels) Transfers: Sit to/from Stand, Bed to chair/wheelchair/BSC Sit to Stand: Contact guard assist   Step pivot transfers: Contact guard assist       General transfer comment: step pivot with CGA without device. sit to stand from bed side commode with rolling walker    Ambulation/Gait Ambulation/Gait assistance: Contact guard assist Gait Distance (Feet): 12 Feet Assistive device: Rolling walker (2 wheels) Gait Pattern/deviations: Step-through pattern, Decreased stride length Gait velocity: decreased     General Gait Details: gait distance self limited by reported mild dizziness. patient is fatigued with activity. encouraged rolling walker for safety with walking rather than reaching out for furniture for support for safety and fall prevention   Stairs             Wheelchair Mobility     Tilt Bed    Modified Rankin (Stroke Patients Only)       Balance Overall balance assessment: Needs assistance Sitting-balance support: Feet supported, No upper extremity supported Sitting balance-Leahy Scale: Fair     Standing balance support: Bilateral upper extremity supported, During functional activity Standing balance-Leahy Scale: Fair                              Hotel manager: No apparent difficulties  Cognition Arousal: Alert Behavior During Therapy: WFL for tasks assessed/performed   PT - Cognitive impairments: No  apparent impairments                         Following commands: Intact      Cueing Cueing Techniques: Verbal cues  Exercises      General Comments        Pertinent Vitals/Pain Pain Assessment Pain Assessment: No/denies pain    Home Living                           Prior Function            PT Goals (current goals can now be found in the care plan section) Acute Rehab PT Goals Patient Stated Goal: improve her LE strength PT Goal Formulation: With patient Time For Goal Achievement: 05/15/23 Potential to Achieve Goals: Good Progress towards PT goals: Progressing toward goals    Frequency    Min 2X/week      PT Plan      Co-evaluation              AM-PAC PT "6 Clicks" Mobility   Outcome Measure  Help needed turning from your back to your side while in a flat bed without using bedrails?: A Little Help needed moving from lying on your back to sitting on the side of a flat bed without using bedrails?: A Little Help needed moving to and from a bed to a chair (including a wheelchair)?: A Lot Help needed standing up from a chair using your arms (e.g., wheelchair or bedside chair)?: A Lot Help needed to walk in hospital room?: A Little Help needed climbing 3-5 steps with a railing? : A Lot 6 Click Score: 15    End of Session   Activity Tolerance: Patient limited by fatigue Patient left: in bed;with call bell/phone within reach;with bed alarm set Nurse Communication: Mobility status PT Visit Diagnosis: Other abnormalities of gait and mobility (R26.89);Muscle weakness (generalized) (M62.81);History of falling (Z91.81);Difficulty in walking, not elsewhere classified (R26.2)     Time: 7829-5621 PT Time Calculation (min) (ACUTE ONLY): 8 min  Charges:    $Therapeutic Activity: 8-22 mins PT General Charges $$ ACUTE PT VISIT: 1 Visit                     Sheila [name], PT, MPT    Sheila [name] 05/03/2023, 1:53 PM

## 2023-05-03 NOTE — Progress Notes (Signed)
 Occupational Therapy Treatment Patient Details Name: Sheila [name] MRN: [id number] DOB: Dec 03, 1960 Today's Date: 05/03/2023   History of present illness 63 y.o. female with PMHx significant for COPD, DM, alcohol use disorder, ulcerative colitis on mesalamine , "neuromuscular disorder" with history of falls, gout, and HTN, presenting with generalized weakness and falls. Pt had a viral illness with cough and congestion a couple weeks ago and had been recovering well until 3 days ago. Denies lightheadedness but feels like her legs just give out when she tries to walk.  She has been staying in bed due to the weakness.  She denies chest pain, shortness of breath, fever or chills, vomiting or diarrhea or abdominal pain or dysuria.  Has cut back on her drinking since the illness.  Drinking 3-4 drinks about 3 to 4 days a week.  Endorses unintentional weight loss.   OT comments  Pt seen for OT tx. Pt completed bed mobility with supv and increased effort with use of bed rails, requiring VC for anterior weight shift prior to standing and cues for hand placement to improve transfer technique with RW. CGA for STS and step pivot to [facility] with RW. Pt required SBA for standing pericare and clothing mgt with and without UE support on the RW with no LOB noted. Pt then completed grooming tasks at the sink with supv with intermittent UE support on the counter for stability. Pt returned to bed, endorsing 7/10 rate of perceived exertion, politely declining additional ADL/mobility. Educated in importance of activity pacing to support ADL/mobility safety and guidance on use of RPE scale. Pt verbalized understanding. Pt progressing towards goals, continues to benefit from skilled OT services.       If plan is discharge home, recommend the following:  A little help with walking and/or transfers;Assistance with cooking/housework;Assist for transportation;Help with stairs or ramp for entrance;A little help with  bathing/dressing/bathroom   Equipment Recommendations  None recommended by OT        Precautions / Restrictions Precautions Precautions: Fall Recall of Precautions/Restrictions: Intact Restrictions Weight Bearing Restrictions Per Provider Order: No     Mobility Bed Mobility Overal bed mobility: Needs Assistance Bed Mobility: Sit to Supine, Supine to Sit     Supine to sit: Supervision, HOB elevated, Used rails     General bed mobility comments: increased effort, use of bed rail    Transfers Overall transfer level: Needs assistance Equipment used: Rolling walker (2 wheels) Transfers: Sit to/from Stand, Bed to chair/wheelchair/BSC Sit to Stand: Contact guard assist     Step pivot transfers: Contact guard assist     General transfer comment: VC for hand placement     Balance Overall balance assessment: Needs assistance Sitting-balance support: Feet supported, No upper extremity supported Sitting balance-Leahy Scale: Good     Standing balance support: Bilateral upper extremity supported, During functional activity, No upper extremity supported Standing balance-Leahy Scale: Fair Standing balance comment: able to briefly stand for clothing mgt after toileting without UE support       ADL either performed or assessed with clinical judgement   ADL Overall ADL's : Needs assistance/impaired     Grooming: Standing;Set up;Wash/dry face;Supervision/safety;Wash/dry hands;Oral care     Lower Body Dressing: Sitting/lateral leans;Set up Lower Body Dressing Details (indicate cue type and reason): set up to slide on sandals Toilet Transfer: Contact guard assist;Rolling walker (2 wheels)   Toileting- Clothing Manipulation and Hygiene: Sit to/from stand;Set up;Contact guard assist       Communication Communication Communication: No apparent difficulties  Cognition Arousal: Alert Behavior During Therapy: WFL for tasks assessed/performed Cognition: No apparent  impairments           Following commands: Intact        Cueing   Cueing Techniques: Verbal cues  Exercises Other Exercises Other Exercises: Pt educated in home/routines modifications and AE for LBA DL tasks to maximize safety Other Exercises: Pt endorsed 7/10 perceived rate of exertion with BSC use and standing grooming at sink, educated in importance of activity pacing to support ADL/mobility safety       General Comments HR on 100s with exertion    Pertinent Vitals/ Pain       Pain Assessment Pain Assessment: No/denies pain   Frequency  Min 2X/week        Progress Toward Goals  OT Goals(current goals can now be found in the care plan section)  Progress towards OT goals: Progressing toward goals  Acute Rehab OT Goals Patient Stated Goal: get better OT Goal Formulation: With patient Time For Goal Achievement: 05/14/23 Potential to Achieve Goals: Good  Plan         AM-PAC OT "6 Clicks" Daily Activity     Outcome Measure   Help from another person eating meals?: None Help from another person taking care of personal grooming?: A Little Help from another person toileting, which includes using toliet, bedpan, or urinal?: A Little Help from another person bathing (including washing, rinsing, drying)?: A Little Help from another person to put on and taking off regular upper body clothing?: None Help from another person to put on and taking off regular lower body clothing?: A Little 6 Click Score: 20    End of Session Equipment Utilized During Treatment: Rolling walker (2 wheels)  OT Visit Diagnosis: Repeated falls (R29.6);Muscle weakness (generalized) (M62.81);Unsteadiness on feet (R26.81)   Activity Tolerance Patient tolerated treatment well   Patient Left in bed;with call bell/phone within reach;with bed alarm set           Time: 1610-9604 OT Time Calculation (min): 20 min  Charges: OT General Charges $OT Visit: 1 Visit OT Treatments $Self  Care/Home Management : 8-22 mins  Berenda Breaker., MPH, MS, OTR/L ascom [phone] 05/03/23, 3:21 PM

## 2023-05-03 NOTE — Plan of Care (Signed)
  Problem: Coping: Goal: Ability to adjust to condition or change in health will improve Outcome: Progressing   Problem: Health Behavior/Discharge Planning: Goal: Ability to manage health-related needs will improve Outcome: Progressing   Problem: Clinical Measurements: Goal: Diagnostic test results will improve Outcome: Progressing   Problem: Activity: Goal: Risk for activity intolerance will decrease Outcome: Progressing   Problem: Nutrition: Goal: Adequate nutrition will be maintained Outcome: Progressing   Problem: Coping: Goal: Level of anxiety will decrease Outcome: Progressing   Problem: Pain Managment: Goal: General experience of comfort will improve and/or be controlled Outcome: Progressing

## 2023-05-04 DIAGNOSIS — E872 Acidosis, unspecified: Secondary | ICD-10-CM | POA: Diagnosis not present

## 2023-05-04 DIAGNOSIS — F1090 Alcohol use, unspecified, uncomplicated: Secondary | ICD-10-CM

## 2023-05-04 DIAGNOSIS — E8729 Other acidosis: Secondary | ICD-10-CM | POA: Diagnosis not present

## 2023-05-04 DIAGNOSIS — R531 Weakness: Secondary | ICD-10-CM

## 2023-05-04 DIAGNOSIS — E876 Hypokalemia: Secondary | ICD-10-CM

## 2023-05-04 DIAGNOSIS — I10 Essential (primary) hypertension: Secondary | ICD-10-CM

## 2023-05-04 DIAGNOSIS — K51919 Ulcerative colitis, unspecified with unspecified complications: Secondary | ICD-10-CM

## 2023-05-04 DIAGNOSIS — R634 Abnormal weight loss: Secondary | ICD-10-CM

## 2023-05-04 LAB — BASIC METABOLIC PANEL WITH GFR
Anion gap: 12 (ref 5–15)
BUN: <5 mg/dL — ABNORMAL LOW (ref 8–23)
CO2: 25 mmol/L (ref 22–32)
Calcium: 8.9 mg/dL (ref 8.9–10.3)
Chloride: 104 mmol/L (ref 98–111)
Creatinine, Ser: 0.38 mg/dL — ABNORMAL LOW (ref 0.44–1.00)
GFR, Estimated: >60 mL/min (ref >60)
Glucose, Bld: 136 mg/dL — ABNORMAL HIGH (ref 70–99)
Potassium: 3.4 mmol/L — ABNORMAL LOW (ref 3.5–5.1)
Sodium: 141 mmol/L (ref 135–145)

## 2023-05-04 LAB — CBC WITH DIFFERENTIAL/PLATELET
Abs Immature Granulocytes: 0.18 10*3/uL — ABNORMAL HIGH (ref 0.00–0.07)
Basophils Absolute: 0.1 10*3/uL (ref 0.0–0.1)
Basophils Relative: 1 %
Eosinophils Absolute: 1.1 10*3/uL — ABNORMAL HIGH (ref 0.0–0.5)
Eosinophils Relative: 7 %
HCT: 30.7 % — ABNORMAL LOW (ref 36.0–46.0)
Hemoglobin: 10.1 g/dL — ABNORMAL LOW (ref 12.0–15.0)
Immature Granulocytes: 1 %
Lymphocytes Relative: 19 %
Lymphs Abs: 3.2 10*3/uL (ref 0.7–4.0)
MCH: 29.7 pg (ref 26.0–34.0)
MCHC: 32.9 g/dL (ref 30.0–36.0)
MCV: 90.3 fL (ref 80.0–100.0)
Monocytes Absolute: 1.5 10*3/uL — ABNORMAL HIGH (ref 0.1–1.0)
Monocytes Relative: 9 %
Neutro Abs: 10.7 10*3/uL — ABNORMAL HIGH (ref 1.7–7.7)
Neutrophils Relative %: 63 %
Platelets: 379 10*3/uL (ref 150–400)
RBC: 3.4 MIL/uL — ABNORMAL LOW (ref 3.87–5.11)
RDW: 16.7 % — ABNORMAL HIGH (ref 11.5–15.5)
WBC: 16.7 10*3/uL — ABNORMAL HIGH (ref 4.0–10.5)
nRBC: 0 % (ref 0.0–0.2)

## 2023-05-04 LAB — GLUCOSE, CAPILLARY
Glucose-Capillary: 152 mg/dL — ABNORMAL HIGH (ref 70–99)
Glucose-Capillary: 111 mg/dL — ABNORMAL HIGH (ref 70–99)
Glucose-Capillary: 126 mg/dL — ABNORMAL HIGH (ref 70–99)
Glucose-Capillary: 128 mg/dL — ABNORMAL HIGH (ref 70–99)

## 2023-05-04 MED ORDER — LATANOPROST 0.005 % OP SOLN
1.0000 [drp] | Freq: Every day | OPHTHALMIC | Status: DC
  Administered 2023-05-04 – 2023-05-05 (×2): 1 [drp] via OPHTHALMIC
  Filled 2023-05-04: qty 2.5

## 2023-05-04 MED ORDER — METOPROLOL SUCCINATE ER 25 MG PO TB24
25.0000 mg | ORAL_TABLET | Freq: Every day | ORAL | Status: DC
  Administered 2023-05-04 – 2023-05-06 (×3): 25 mg via ORAL
  Filled 2023-05-04 (×3): qty 1

## 2023-05-04 MED ORDER — LOSARTAN POTASSIUM 25 MG PO TABS
25.0000 mg | ORAL_TABLET | Freq: Every day | ORAL | Status: DC
  Administered 2023-05-04 – 2023-05-06 (×3): 25 mg via ORAL
  Filled 2023-05-04 (×3): qty 1

## 2023-05-04 MED ORDER — BRINZOLAMIDE 1 % OP SUSP
1.0000 [drp] | Freq: Two times a day (BID) | OPHTHALMIC | Status: DC
  Administered 2023-05-04 – 2023-05-06 (×4): 1 [drp] via OPHTHALMIC
  Filled 2023-05-04: qty 10

## 2023-05-04 MED ORDER — ALLOPURINOL 100 MG PO TABS
100.0000 mg | ORAL_TABLET | Freq: Every day | ORAL | Status: DC
  Administered 2023-05-04 – 2023-05-06 (×3): 100 mg via ORAL
  Filled 2023-05-04 (×3): qty 1

## 2023-05-04 NOTE — Progress Notes (Signed)
 Progress Note   Patient: Sheila [name]:[id number] DOB: Dec 14, 1960 DOA: 04/29/2023     4 DOS: the patient was seen and examined on 05/04/2023   Brief hospital course:   Sheila [name] is a 64 y.o. female with medical history significant for COPD, DM, alcohol use disorder, ulcerative colitis on mesalamine , "neuromuscular disorder" with history of falls, gout, hypertension presenting with generalized weakness and falls patient had a viral illness with cough and congestion a couple weeks ago and had been recovering well until 3 days ago when she became very weak resulting in falls.  Denies lightheadedness but feels like her legs just give out when she tries to walk.  She has been staying in bed due to the weakness.  CT scan and MRI of the brain did not show any acute intracranial pathology.  Patient found to have severe electrolyte derangement including low potassium and hypomagnesemia which have improved.  Patient seen by PT OT with recommendation for rehab placement. 4/30: Hemodynamically stable, unable to get any SNF bed offer due to her insurance.,  Having difficulty finding home health.  Family and TOC is working for a safe discharge.   Assessment and Plan:   Alcoholic ketoacidosis, suspected Alcohol use disorder Patient presents with generalized weakness and falls, hypokalemia, hypomagnesemia, anion gap metabolic acidosis, ketonuria, 2-1 AST/ALT ratio No GI symptoms of vomiting and diarrhea Suspecting alcoholic ketoacidosis, possibly triggered by recent cut back in drinking Continue thiamine   Continue CIWA protocol Monitor electrolyte closely   Hypomagnesemia-improved Monitor as needed   Hypokalemia-potassium at 3.4 today with normal magnesium , Continue repletion and monitoring   Leukocytosis of unclear etiology-improved Thrombocytosis-improved Normocytic anemia No signs of infection at this time Antibiotics have been de-escalated awaiting final culture results Could  potentially discontinue antibiotics after 5 days course if final culture results shows no growth-still no growth   Lactic acidosis-improved after rehydration   Generalized weakness History of falls Ambulatory dysfunction Secondary to electrolyte derangements, possible occult infection CTA head and neck and MRI brain with no acute abnormalities Chart review reveals history of neuromuscular disorder however no specialist visits related to same, and patient denies knowledge of any such disorder PT OT recommending rehab-having difficulty finding a facility Folic acid  and vitamin B12 levels within normal limit   Ulcerative colitis (HCC) Not acutely flared Continue mesalamine    HTN (hypertension) Blood pressure started trending up -Restarting home losartan  and metoprolol  - Holding chlorthalidone -can raise start if needed   COPD (chronic obstructive pulmonary disease) (HCC) Not acutely exacerbated Albuterol  as needed   Alcohol use disorder For counseling on abstinence   Diabetes (HCC) Sliding scale insulin  coverage   Unintentional weight loss Patient endorses unintentional weight loss Recommend age-appropriate screening as an outpatient   DVT prophylaxis: Lovenox    Consults: none   Advance Care Planning:   Code Status: Full   Family Communication: Discussed with daughter on phone   Disposition Plan: Needs SNF but unfortunately none take her insurance-TOC and family working on safe discharge plan  Subjective:  Patient was seen and examined today.  No new concern.  She was little upset that she cannot go to rehab.   Physical Exam: General.  Well-developed lady, in no acute distress. Pulmonary.  Lungs clear bilaterally, normal respiratory effort. CV.  Regular rate and rhythm, no JVD, rub or murmur. Abdomen.  Soft, nontender, nondistended, BS positive. CNS.  Alert and oriented .  No focal neurologic deficit. Extremities.  No edema, no cyanosis, pulses intact and  symmetrical. Psychiatry.  Judgment and insight appears normal.    Vitals:   05/03/23 1518 05/03/23 2025 05/04/23 0327 05/04/23 0736  BP: (!) 147/81 139/76 (!) 147/85 (!) 149/95  Pulse: 90 91 90 98  Resp: 18 18 18 15   Temp: 98 F (36.7 C) 98.2 F (36.8 C) 98.2 F (36.8 C) 98.3 F (36.8 C)  TempSrc: Oral     SpO2: 99% 96% 97% 99%  Weight:      Height:        Data Reviewed:     Latest Ref Rng & Units 05/04/2023    3:48 AM 05/03/2023    4:41 AM 05/02/2023    6:04 AM  CBC  WBC 4.0 - 10.5 K/uL 16.7  16.4  16.1   Hemoglobin 12.0 - 15.0 g/dL 16.1  9.6  9.3   Hematocrit 36.0 - 46.0 % 30.7  28.6  28.5   Platelets 150 - 400 K/uL 379  366  333        Latest Ref Rng & Units 05/04/2023    3:48 AM 05/03/2023    4:41 AM 05/02/2023    6:04 AM  BMP  Glucose 70 - 99 mg/dL 096  045  409   BUN 8 - 23 mg/dL <5  <5  6   Creatinine 0.44 - 1.00 mg/dL 8.11  9.14  7.82   Sodium 135 - 145 mmol/L 141  140  142   Potassium 3.5 - 5.1 mmol/L 3.4  3.7  3.4   Chloride 98 - 111 mmol/L 104  105  104   CO2 22 - 32 mmol/L 25  24  27    Calcium  8.9 - 10.3 mg/dL 8.9  8.9  8.6       Author: Luna Salinas, MD 05/04/2023 3:05 PM  For on call review [email or web address].

## 2023-05-04 NOTE — Plan of Care (Signed)
  Problem: Coping: Goal: Ability to adjust to condition or change in health will improve Outcome: Progressing   Problem: Metabolic: Goal: Ability to maintain appropriate glucose levels will improve Outcome: Progressing   Problem: Nutritional: Goal: Maintenance of adequate nutrition will improve Outcome: Progressing   Problem: Clinical Measurements: Goal: Diagnostic test results will improve Outcome: Progressing   Problem: Clinical Measurements: Goal: Diagnostic test results will improve Outcome: Progressing   Problem: Pain Managment: Goal: General experience of comfort will improve and/or be controlled Outcome: Progressing   Problem: Safety: Goal: Ability to remain free from injury will improve Outcome: Progressing

## 2023-05-04 NOTE — TOC Progression Note (Signed)
 Transition of Care [facility]) - Progression Note    Patient Details  Name: Sheila [name] MRN: [id number] Date of Birth: 1960/02/02  Transition of [facility]) CM/SW Contact  Alexandra Ice, RN Phone Number: 05/04/2023, 11:40 AM  Clinical Narrative:     Met with patient and sister, Sheila [name], at bedside. Spoke with Sheila [name] over speaker phone. Discussed discharge plan; TOC explained patient does not have any accepting SNF facilities in [place] or [place] area. TOC can arrange for DME and has found a home health agency for her. Sheila [name] is concerned about patient being left alone as she lives in [place] and no one can come right away. Daughter is going to speak with her landlord regarding situation and see if patient can stay with her at discharge, she does live on 3rd floor of apartment complex in [place]. If she can't then she will stay with sister in [place]. TOC will follow up with them this afternoon with decision. Sent message to [facility], [facility], [facility] and [facility] all do not accept insurance. Sent referral to [place]  with [facility] she will process and let TOC know.   Expected Discharge Plan: Skilled Nursing Facility Barriers to Discharge: Continued Medical Work up  Expected Discharge Plan and Services       Living arrangements for the past 2 months: Single Family Home                                       Social Determinants of Health (SDOH) Interventions SDOH Screenings   Food Insecurity: No Food Insecurity (04/30/2023)  Housing: Low Risk  (04/30/2023)  Transportation Needs: No Transportation Needs (04/30/2023)  Utilities: Not At Risk (04/30/2023)  Social Connections: Unknown (04/30/2023)  Tobacco Use: Medium Risk (05/01/2023)    Readmission Risk Interventions     No data to display

## 2023-05-04 NOTE — Progress Notes (Signed)
 Physical Therapy Treatment Patient Details Name: JAKEIA STRUNK MRN: [id number] DOB: 1960/12/13 Today's Date: 05/04/2023   History of Present Illness 63 y.o. female with PMHx significant for COPD, DM, alcohol use disorder, ulcerative colitis on mesalamine , "neuromuscular disorder" with history of falls, gout, and HTN, presenting with generalized weakness and falls. Pt had a viral illness with cough and congestion a couple weeks ago and had been recovering well until 3 days ago. Denies lightheadedness but feels like her legs just give out when she tries to walk.  She has been staying in bed due to the weakness.  She denies chest pain, shortness of breath, fever or chills, vomiting or diarrhea or abdominal pain or dysuria.  Has cut back on her drinking since the illness.  Drinking 3-4 drinks about 3 to 4 days a week.  Endorses unintentional weight loss.    PT Comments  Patient received in bed, she requires some encouragement to participate and get going. [name] mod I for bed mobility and stands from low bed with supervision. Patient is able to increase gait distance this session to ~75 feet with RW and supervision. No lob, mild sob after activity. No reported pain or dizziness this session. She will continue to benefit from skilled PT to improve functional independence, strength and safety.      If plan is discharge home, recommend the following: A little help with walking and/or transfers;Assistance with cooking/housework;Assist for transportation;Help with stairs or ramp for entrance;A little help with bathing/dressing/bathroom   Can travel by private vehicle     Yes  Equipment Recommendations  Rolling walker (2 wheels)    Recommendations for Other Services       Precautions / Restrictions Precautions Precautions: Fall Recall of Precautions/Restrictions: Intact Restrictions Weight Bearing Restrictions Per Provider Order: No     Mobility  Bed Mobility Overal bed mobility: Modified  Independent Bed Mobility: Supine to Sit, Sit to Supine     Supine to sit: Modified independent (Device/Increase time), HOB elevated Sit to supine: Modified independent (Device/Increase time)   General bed mobility comments: increased effort    Transfers Overall transfer level: Needs assistance Equipment used: Rolling walker (2 wheels) Transfers: Sit to/from Stand Sit to Stand: Supervision                Ambulation/Gait Ambulation/Gait assistance: Contact guard assist Gait Distance (Feet): 75 Feet Assistive device: Rolling walker (2 wheels) Gait Pattern/deviations: Step-through pattern, Decreased step length - right, Decreased step length - left, Decreased stride length Gait velocity: decreased     General Gait Details: no reports of pain or dizziness this session. Mild SOB with activity. Improved ambulation and activity tolerance this session.   Stairs             Wheelchair Mobility     Tilt Bed    Modified Rankin (Stroke Patients Only)       Balance Overall balance assessment: Needs assistance Sitting-balance support: Feet supported Sitting balance-Leahy Scale: Good     Standing balance support: Bilateral upper extremity supported, During functional activity, Reliant on assistive device for balance Standing balance-Leahy Scale: Good Standing balance comment: no lob                            Communication Communication Communication: No apparent difficulties  Cognition Arousal: Alert Behavior During Therapy: WFL for tasks assessed/performed   PT - Cognitive impairments: No apparent impairments  Following commands: Intact      Cueing Cueing Techniques: Verbal cues  Exercises      General Comments        Pertinent Vitals/Pain Pain Assessment Pain Assessment: No/denies pain Pain Intervention(s): Monitored during session    Home Living                          Prior Function             PT Goals (current goals can now be found in the care plan section) Acute Rehab PT Goals Patient Stated Goal: improve her LE strength PT Goal Formulation: With patient Time For Goal Achievement: 05/15/23 Potential to Achieve Goals: Good Progress towards PT goals: Progressing toward goals    Frequency    Min 2X/week      PT Plan      Co-evaluation              AM-PAC PT "6 Clicks" Mobility   Outcome Measure  Help needed turning from your back to your side while in a flat bed without using bedrails?: None Help needed moving from lying on your back to sitting on the side of a flat bed without using bedrails?: None Help needed moving to and from a bed to a chair (including a wheelchair)?: A Little Help needed standing up from a chair using your arms (e.g., wheelchair or bedside chair)?: A Little Help needed to walk in hospital room?: A Little Help needed climbing 3-5 steps with a railing? : A Lot 6 Click Score: 19    End of Session Equipment Utilized During Treatment: Gait belt Activity Tolerance: Patient limited by fatigue Patient left: in bed;with call bell/phone within reach;with bed alarm set;with family/visitor present Nurse Communication: Mobility status PT Visit Diagnosis: Muscle weakness (generalized) (M62.81);History of falling (Z91.81);Difficulty in walking, not elsewhere classified (R26.2)     Time: 1610-9604 PT Time Calculation (min) (ACUTE ONLY): 16 min  Charges:    $Gait Training: 8-22 mins PT General Charges $$ ACUTE PT VISIT: 1 Visit                     Kabe Mckoy, PT, GCS 05/04/23,10:39 AM

## 2023-05-05 DIAGNOSIS — E876 Hypokalemia: Secondary | ICD-10-CM | POA: Diagnosis not present

## 2023-05-05 DIAGNOSIS — E8729 Other acidosis: Secondary | ICD-10-CM | POA: Diagnosis not present

## 2023-05-05 DIAGNOSIS — E872 Acidosis, unspecified: Secondary | ICD-10-CM | POA: Diagnosis not present

## 2023-05-05 LAB — GLUCOSE, CAPILLARY
Glucose-Capillary: 177 mg/dL — ABNORMAL HIGH (ref 70–99)
Glucose-Capillary: 124 mg/dL — ABNORMAL HIGH (ref 70–99)
Glucose-Capillary: 117 mg/dL — ABNORMAL HIGH (ref 70–99)
Glucose-Capillary: 133 mg/dL — ABNORMAL HIGH (ref 70–99)

## 2023-05-05 LAB — CULTURE, BLOOD (ROUTINE X 2)

## 2023-05-05 MED ORDER — INSULIN ASPART 100 UNIT/ML IJ SOLN
0.0000 [IU] | Freq: Three times a day (TID) | INTRAMUSCULAR | Status: DC
  Administered 2023-05-05: 3 [IU] via SUBCUTANEOUS
  Administered 2023-05-05: 2 [IU] via SUBCUTANEOUS
  Administered 2023-05-06: 3 [IU] via SUBCUTANEOUS
  Filled 2023-05-05 (×3): qty 1

## 2023-05-05 MED ORDER — INSULIN ASPART 100 UNIT/ML IJ SOLN: 0.0000 [IU] | Freq: Three times a day (TID) | INTRAMUSCULAR | Status: DC

## 2023-05-05 NOTE — Progress Notes (Signed)
 Occupational Therapy Treatment Patient Details Name: Sheila [name] MRN: [id number] DOB: [birth year] Today's Date: 05/05/2023   History of present illness 63 y.o. female with PMHx significant for COPD, DM, alcohol use disorder, ulcerative colitis on mesalamine , "neuromuscular disorder" with history of falls, gout, and HTN, presenting with generalized weakness and falls. Pt had a viral illness with cough and congestion a couple weeks ago and had been recovering well until 3 days ago. Denies lightheadedness but feels like her legs just give out when she tries to walk.  She has been staying in bed due to the weakness.  She denies chest pain, shortness of breath, fever or chills, vomiting or diarrhea or abdominal pain or dysuria.  Has cut back on her drinking since the illness.  Drinking 3-4 drinks about 3 to 4 days a week.  Endorses unintentional weight loss.   OT comments  Pt demonstrated improve this date; she was able to perform bed mobility, transfers, grooming, toileting, peri-care, LB dressing, ambulation with RW, all without physical assistance from therapist and with good balance. Per pt's request, conversed with pt's daughter re: DC recs and options. Pt is generally oriented although did demonstrate moments of confusion (e.g., stated that she thought she had been at [facility] 2 weeks, when she has actually been here 5 days). Discussed harm reduction, used motivational interviewing techniques to encourage pt to consider strategies for addressing her substance use disorder.       If plan is discharge home, recommend the following:  A little help with walking and/or transfers;Assistance with cooking/housework;Assist for transportation;Help with stairs or ramp for entrance;A little help with bathing/dressing/bathroom   Equipment Recommendations  None recommended by OT    Recommendations for Other Services      Precautions / Restrictions Precautions Precautions: Fall Recall of  Precautions/Restrictions: Intact Restrictions Weight Bearing Restrictions Per Provider Order: No       Mobility Bed Mobility Overal bed mobility: Modified Independent Bed Mobility: Supine to Sit, Sit to Supine     Supine to sit: Modified independent (Device/Increase time) Sit to supine: Modified independent (Device/Increase time)   General bed mobility comments: increased effort    Transfers Overall transfer level: Modified independent Equipment used: Rolling walker (2 wheels) Transfers: Sit to/from Stand Sit to Stand: Modified independent (Device/Increase time), From elevated surface                 Balance Overall balance assessment: Modified Independent Sitting-balance support: Feet supported Sitting balance-Leahy Scale: Normal     Standing balance support: Bilateral upper extremity supported, During functional activity, No upper extremity supported Standing balance-Leahy Scale: Good                 High Level Balance Comments: able to perform grooming tasks in standing, no UE support required           ADL either performed or assessed with clinical judgement   ADL Overall ADL's : Needs assistance/impaired     Grooming: Wash/dry hands;Wash/dry face;Supervision/safety;Standing               Lower Body Dressing: Sit to/from stand;Supervision/safety   Toilet Transfer: Rolling walker (2 wheels);Ambulation;Comfort height toilet;Supervision/safety   Toileting- Clothing Manipulation and Hygiene: Supervision/safety;Sit to/from stand              Extremity/Trunk Assessment Upper Extremity Assessment Upper Extremity Assessment: Generalized weakness   Lower Extremity Assessment Lower Extremity Assessment: Generalized weakness        Vision  Perception     Praxis     Communication Communication Communication: No apparent difficulties   Cognition Arousal: Alert Behavior During Therapy: WFL for tasks assessed/performed                                           Cueing      Exercises Other Exercises Other Exercises: Educ in health behavorial modifications; harm reduction re: alcohol consumption    Shoulder Instructions       General Comments      Pertinent Vitals/ Pain       Pain Assessment Pain Assessment: No/denies pain  Home Living                                          Prior Functioning/Environment              Frequency  Min 2X/week        Progress Toward Goals  OT Goals(current goals can now be found in the care plan section)        Plan      Co-evaluation                 AM-PAC OT "6 Clicks" Daily Activity     Outcome Measure   Help from another person eating meals?: None Help from another person taking care of personal grooming?: A Little Help from another person toileting, which includes using toliet, bedpan, or urinal?: A Little Help from another person bathing (including washing, rinsing, drying)?: A Little Help from another person to put on and taking off regular upper body clothing?: None Help from another person to put on and taking off regular lower body clothing?: A Little 6 Click Score: 20    End of Session Equipment Utilized During Treatment: Rolling walker (2 wheels)  OT Visit Diagnosis: Repeated falls (R29.6);Muscle weakness (generalized) (M62.81);Unsteadiness on feet (R26.81)   Activity Tolerance Patient tolerated treatment well   Patient Left in bed;with call bell/phone within reach;with bed alarm set   Nurse Communication          Time: 4034-7425 OT Time Calculation (min): 18 min  Charges: OT General Charges $OT Visit: 1 Visit OT Treatments $Self Care/Home Management : 8-22 mins  Basil Boston, PhD, MS, OTR/L 05/05/23, 4:43 PM

## 2023-05-05 NOTE — Progress Notes (Signed)
 Physical Therapy Treatment Patient Details Name: Sheila [name] MRN: [id number] DOB: 02/03/1960 Today's Date: 05/05/2023   History of Present Illness 63 y.o. female with PMHx significant for COPD, DM, alcohol use disorder, ulcerative colitis on mesalamine , "neuromuscular disorder" with history of falls, gout, and HTN, presenting with generalized weakness and falls. Pt had a viral illness with cough and congestion a couple weeks ago and had been recovering well until 3 days ago. Denies lightheadedness but feels like her legs just give out when she tries to walk.  She has been staying in bed due to the weakness.  She denies chest pain, shortness of breath, fever or chills, vomiting or diarrhea or abdominal pain or dysuria.  Has cut back on her drinking since the illness.  Drinking 3-4 drinks about 3 to 4 days a week.  Endorses unintentional weight loss.    PT Comments  Patient received getting out of bed with NT present in room. Patient transferred to [facility] with cga. After using bathroom patient is able to independently perform self care. [name] able to stand from [facility] with supervision and ambulated 100 feet with RW and cga. No lob. 2 brief standing rest breaks due to patient fatigue. She will continue to benefit from skilled PT to improve independence, endurance and strength.       If plan is discharge home, recommend the following: A little help with walking and/or transfers;Assistance with cooking/housework;Assist for transportation;Help with stairs or ramp for entrance;A little help with bathing/dressing/bathroom   Can travel by private vehicle     Yes  Equipment Recommendations  Rolling walker (2 wheels)    Recommendations for Other Services       Precautions / Restrictions Precautions Precautions: Fall Recall of Precautions/Restrictions: Intact Restrictions Weight Bearing Restrictions Per Provider Order: No     Mobility  Bed Mobility Overal bed mobility: Modified Independent Bed  Mobility: Supine to Sit, Sit to Supine     Supine to sit: Modified independent (Device/Increase time) Sit to supine: Modified independent (Device/Increase time)        Transfers Overall transfer level: Modified independent Equipment used: Rolling walker (2 wheels) Transfers: Sit to/from Stand Sit to Stand: Modified independent (Device/Increase time)   Step pivot transfers: Modified independent (Device/Increase time)            Ambulation/Gait Ambulation/Gait assistance: Contact guard assist Gait Distance (Feet): 100 Feet Assistive device: Rolling walker (2 wheels) Gait Pattern/deviations: Step-through pattern, Decreased step length - right, Decreased step length - left, Decreased stride length Gait velocity: decr     General Gait Details: patient requires a couple of brief standing rest breaks due to fatigue. No lob or significant difficulty noted.   Stairs             Wheelchair Mobility     Tilt Bed    Modified Rankin (Stroke Patients Only)       Balance Overall balance assessment: Modified Independent Sitting-balance support: Feet supported Sitting balance-Leahy Scale: Normal     Standing balance support: Bilateral upper extremity supported, During functional activity, Reliant on assistive device for balance Standing balance-Leahy Scale: Good Standing balance comment: no lob                            Communication Communication Communication: No apparent difficulties  Cognition Arousal: Alert Behavior During Therapy: WFL for tasks assessed/performed   PT - Cognitive impairments: No apparent impairments  Following commands: Intact      Cueing Cueing Techniques: Verbal cues  Exercises      General Comments        Pertinent Vitals/Pain Pain Assessment Pain Assessment: No/denies pain    Home Living                          Prior Function            PT Goals (current  goals can now be found in the care plan section) Acute Rehab PT Goals Patient Stated Goal: improve her LE strength PT Goal Formulation: With patient Time For Goal Achievement: 05/15/23 Potential to Achieve Goals: Good Progress towards PT goals: Progressing toward goals    Frequency    Min 2X/week      PT Plan      Co-evaluation              AM-PAC PT "6 Clicks" Mobility   Outcome Measure  Help needed turning from your back to your side while in a flat bed without using bedrails?: None Help needed moving from lying on your back to sitting on the side of a flat bed without using bedrails?: None Help needed moving to and from a bed to a chair (including a wheelchair)?: A Little Help needed standing up from a chair using your arms (e.g., wheelchair or bedside chair)?: A Little Help needed to walk in hospital room?: A Little Help needed climbing 3-5 steps with a railing? : A Little 6 Click Score: 20    End of Session Equipment Utilized During Treatment: Gait belt Activity Tolerance: Patient limited by fatigue Patient left: in bed;with call bell/phone within reach;with bed alarm set Nurse Communication: Mobility status PT Visit Diagnosis: Muscle weakness (generalized) (M62.81);History of falling (Z91.81)     Time: 1478-2956 PT Time Calculation (min) (ACUTE ONLY): 16 min  Charges:    $Gait Training: 8-22 mins PT General Charges $$ ACUTE PT VISIT: 1 Visit                     Lashe Oliveira, PT, GCS 05/05/23,1:18 PM

## 2023-05-05 NOTE — TOC Progression Note (Signed)
 Transition of Care [facility]) - Progression Note    Patient Details  Name: EDA PELLMAN MRN: [id number] Date of Birth: 1960-10-26  Transition of Care [facility]) CM/SW Contact  Alexandra Ice, RN Phone Number: 05/05/2023, 4:14 PM  Clinical Narrative:     Raymonde Calico, daughter, and stated facility she provided was Acute Rehab and that she was not recommended for acute rehab. Reached out to [facility], spoke with Estel Heir, stated they do not accept patient's insurance. Reached out to [facility] and they do not accept insurance. Called Juanita and provided her update, and stated she needs to speak with her aunt and discussed next steps. Options are she can stay with aunt in [place], or she can return home and receive her home health. Patient has neighbors who can provide assistance. Daughter or sister will need to provide transportation at discharge.  She verbalized understanding.   Expected Discharge Plan: Skilled Nursing Facility Barriers to Discharge: Continued Medical Work up  Expected Discharge Plan and Services       Living arrangements for the past 2 months: Single Family Home                                       Social Determinants of Health (SDOH) Interventions SDOH Screenings   Food Insecurity: No Food Insecurity (04/30/2023)  Housing: Low Risk  (04/30/2023)  Transportation Needs: No Transportation Needs (04/30/2023)  Utilities: Not At Risk (04/30/2023)  Social Connections: Unknown (04/30/2023)  Tobacco Use: Medium Risk (05/01/2023)    Readmission Risk Interventions     No data to display

## 2023-05-05 NOTE — Progress Notes (Signed)
 Progress Note   Patient: Sheila [name] GEX:[id number] DOB: 1960-06-20 DOA: 04/29/2023     5 DOS: the patient was seen and examined on 05/05/2023   Brief hospital course:   Sheila [name] is a 63 y.o. female with medical history significant for COPD, DM, alcohol use disorder, ulcerative colitis on mesalamine , "neuromuscular disorder" with history of falls, gout, hypertension presenting with generalized weakness and falls patient had a viral illness with cough and congestion a couple weeks ago and had been recovering well until 3 days ago when she became very weak resulting in falls.  Denies lightheadedness but feels like her legs just give out when she tries to walk.  She has been staying in bed due to the weakness.  CT scan and MRI of the brain did not show any acute intracranial pathology.  Patient found to have severe electrolyte derangement including low potassium and hypomagnesemia which have improved.  Patient seen by PT OT with recommendation for rehab placement. 4/30: Hemodynamically stable, unable to get any SNF bed offer due to her insurance.,  Having difficulty finding home health.  Family and TOC is working for a safe discharge. 5/1: Per daughter she found 1 facility in [place] who can take her insurance-TOC to confirm and then proceed.   Assessment and Plan:   Alcoholic ketoacidosis, suspected Alcohol use disorder Patient presents with generalized weakness and falls, hypokalemia, hypomagnesemia, anion gap metabolic acidosis, ketonuria, 2-1 AST/ALT ratio No GI symptoms of vomiting and diarrhea Suspecting alcoholic ketoacidosis, possibly triggered by recent cut back in drinking Continue thiamine   Continue CIWA protocol Monitor electrolyte closely   Hypomagnesemia-improved Monitor as needed   Hypokalemia-potassium at 3.4 with normal magnesium , Continue repletion and monitoring   Leukocytosis of unclear etiology-improved Thrombocytosis-improved Normocytic anemia No signs  of infection at this time Antibiotics have been de-escalated awaiting final culture results Could potentially discontinue antibiotics after 5 days course if final culture results shows no growth-still no growth   Lactic acidosis-improved after rehydration   Generalized weakness History of falls Ambulatory dysfunction Secondary to electrolyte derangements, possible occult infection CTA head and neck and MRI brain with no acute abnormalities Chart review reveals history of neuromuscular disorder however no specialist visits related to same, and patient denies knowledge of any such disorder PT OT recommending rehab-having difficulty finding a facility Folic acid  and vitamin B12 levels within normal limit   Ulcerative colitis (HCC) Not acutely flared Continue mesalamine    HTN (hypertension) Blood pressure started trending up -Restarting home losartan  and metoprolol  - Holding chlorthalidone -can raise start if needed   COPD (chronic obstructive pulmonary disease) (HCC) Not acutely exacerbated Albuterol  as needed   Alcohol use disorder For counseling on abstinence   Diabetes (HCC) Sliding scale insulin  coverage-changed to moderate   Unintentional weight loss Patient endorses unintentional weight loss Recommend age-appropriate screening as an outpatient   DVT prophylaxis: Lovenox    Consults: none   Advance Care Planning:   Code Status: Full   Family Communication: Discussed with daughter on phone   Disposition Plan: Needs SNF but unfortunately none take her insurance-TOC and family working on safe discharge plan  Subjective:  Patient was seen and examined today.  No new concern.  Per patient her daughter found a facility for her.   Physical Exam: General.  Well-developed lady, in no acute distress. Pulmonary.  Lungs clear bilaterally, normal respiratory effort. CV.  Regular rate and rhythm, no JVD, rub or murmur. Abdomen.  Soft, nontender, nondistended, BS  positive. CNS.  Alert and  oriented .  No focal neurologic deficit. Extremities.  No edema, no cyanosis, pulses intact and symmetrical. Psychiatry.  Judgment and insight appears normal.    Vitals:   05/04/23 1553 05/04/23 1921 05/05/23 0403 05/05/23 0744  BP: (!) 150/61 130/88 (!) 146/63 (!) 142/87  Pulse: 89 90 93 94  Resp: 16 16 17 15   Temp: 99 F (37.2 C) 98.7 F (37.1 C) 98.5 F (36.9 C) 98 F (36.7 C)  TempSrc:  Oral    SpO2: 98% 100% 97% 96%  Weight:      Height:        Data Reviewed:     Latest Ref Rng & Units 05/04/2023    3:48 AM 05/03/2023    4:41 AM 05/02/2023    6:04 AM  CBC  WBC 4.0 - 10.5 K/uL 16.7  16.4  16.1   Hemoglobin 12.0 - 15.0 g/dL 96.2  9.6  9.3   Hematocrit 36.0 - 46.0 % 30.7  28.6  28.5   Platelets 150 - 400 K/uL 379  366  333        Latest Ref Rng & Units 05/04/2023    3:48 AM 05/03/2023    4:41 AM 05/02/2023    6:04 AM  BMP  Glucose 70 - 99 mg/dL 952  841  324   BUN 8 - 23 mg/dL <5  <5  6   Creatinine 0.44 - 1.00 mg/dL 4.01  0.27  2.53   Sodium 135 - 145 mmol/L 141  140  142   Potassium 3.5 - 5.1 mmol/L 3.4  3.7  3.4   Chloride 98 - 111 mmol/L 104  105  104   CO2 22 - 32 mmol/L 25  24  27    Calcium  8.9 - 10.3 mg/dL 8.9  8.9  8.6       Author: Luna Salinas, MD 05/05/2023 2:03 PM  For on call review [email or web address].

## 2023-05-06 DIAGNOSIS — J449 Chronic obstructive pulmonary disease, unspecified: Secondary | ICD-10-CM

## 2023-05-06 DIAGNOSIS — E8729 Other acidosis: Secondary | ICD-10-CM | POA: Diagnosis not present

## 2023-05-06 DIAGNOSIS — R29898 Other symptoms and signs involving the musculoskeletal system: Secondary | ICD-10-CM

## 2023-05-06 DIAGNOSIS — G709 Myoneural disorder, unspecified: Secondary | ICD-10-CM

## 2023-05-06 DIAGNOSIS — E876 Hypokalemia: Secondary | ICD-10-CM | POA: Diagnosis not present

## 2023-05-06 LAB — BASIC METABOLIC PANEL WITH GFR
Anion gap: 10 (ref 5–15)
BUN: 9 mg/dL (ref 8–23)
CO2: 24 mmol/L (ref 22–32)
Calcium: 8.4 mg/dL — ABNORMAL LOW (ref 8.9–10.3)
Chloride: 101 mmol/L (ref 98–111)
Creatinine, Ser: 0.4 mg/dL — ABNORMAL LOW (ref 0.44–1.00)
GFR, Estimated: >60 mL/min (ref >60)
Glucose, Bld: 175 mg/dL — ABNORMAL HIGH (ref 70–99)
Potassium: 3.1 mmol/L — ABNORMAL LOW (ref 3.5–5.1)
Sodium: 135 mmol/L (ref 135–145)

## 2023-05-06 LAB — CBC
HCT: 34.4 % — ABNORMAL LOW (ref 36.0–46.0)
Hemoglobin: 10.9 g/dL — ABNORMAL LOW (ref 12.0–15.0)
MCH: 28.9 pg (ref 26.0–34.0)
MCHC: 31.7 g/dL (ref 30.0–36.0)
MCV: 91.2 fL (ref 80.0–100.0)
Platelets: 407 10*3/uL — ABNORMAL HIGH (ref 150–400)
RBC: 3.77 MIL/uL — ABNORMAL LOW (ref 3.87–5.11)
RDW: 16 % — ABNORMAL HIGH (ref 11.5–15.5)
WBC: 18.8 10*3/uL — ABNORMAL HIGH (ref 4.0–10.5)
nRBC: 0 % (ref 0.0–0.2)

## 2023-05-06 LAB — CULTURE, BLOOD (ROUTINE X 2)
Culture: NO GROWTH
Culture: NO GROWTH

## 2023-05-06 LAB — GLUCOSE, CAPILLARY
Glucose-Capillary: 161 mg/dL — ABNORMAL HIGH (ref 70–99)
Glucose-Capillary: 106 mg/dL — ABNORMAL HIGH (ref 70–99)

## 2023-05-06 MED ORDER — POTASSIUM CHLORIDE CRYS ER 20 MEQ PO TBCR
40.0000 meq | EXTENDED_RELEASE_TABLET | Freq: Once | ORAL | Status: AC
  Administered 2023-05-06: 40 meq via ORAL
  Filled 2023-05-06: qty 2

## 2023-05-06 NOTE — Discharge Summary (Signed)
 Physician Discharge Summary   Patient: Sheila [name] MRN: [id number] DOB: 05/12/1960  Admit date:     04/29/2023  Discharge date: 05/06/23  Discharge Physician: Luna Salinas   PCP: Almetta Armor, MD   Recommendations at discharge:  Please obtain CBC and BMP on follow-up Follow-up with primary care provider within a week  Discharge Diagnoses: Principal Problem:   Alcoholic ketoacidosis, suspected Active Problems:   Left leg weakness   Hypokalemia   Hypomagnesemia   Lactic acidosis   Leukocytosis   Generalized weakness   HTN (hypertension)   Ulcerative colitis (HCC)   Neuromuscular disorder (HCC)   Diabetes (HCC)   Alcohol use disorder   COPD (chronic obstructive pulmonary disease) (HCC)   Personal history of falls   Unintentional weight loss  Resolved Problems:   * No resolved hospital problems. *  Hospital Course: Sheila [name] is a 63 y.o. female with medical history significant for COPD, DM, alcohol use disorder, ulcerative colitis on mesalamine , "neuromuscular disorder" with history of falls, gout, hypertension presenting with generalized weakness and falls patient had a viral illness with cough and congestion a couple weeks ago and had been recovering well until 3 days ago when she became very weak resulting in falls.  Denies lightheadedness but feels like her legs just give out when she tries to walk.  She has been staying in bed due to the weakness.  CT scan and MRI of the brain did not show any acute intracranial pathology.  Patient found to have severe electrolyte derangement including low potassium and hypomagnesemia which have improved.  Patient seen by PT OT with recommendation for rehab placement.   Patient remained hemodynamically stable.  Unfortunately none of the rehab facilities accept her insurance.  Case was discussed with family and despite multiple attempts we could not find any rehab place for her.  Patient is being discharged home with home  health and family will provide rest of the assistance.  Patient will continue on current medications and follow-up with her primary care provider for further assistance.  Assessment and Plan: * Alcoholic ketoacidosis, suspected Alcohol use disorder Patient presents with generalized weakness and falls, hypokalemia, hypomagnesemia, anion gap metabolic acidosis, ketonuria, 2-1 AST/ALT ratio No GI symptoms of vomiting and diarrhea Suspecting alcoholic ketoacidosis, possibly triggered by recent cut back in drinking Counseling was provided and patient should continue with thiamine  supplement.  Hypomagnesemia Improved with replacement  Hypokalemia Potassium was replaced  Leukocytosis Thrombocytosis Anemia WBC almost 30,000 with thrombocytosis Uncertain etiology, no localizing signs of infection.  UA unremarkable.  Chest x-ray clear ?leukemoid reaction Anemia panel Will get lactic acid, procalcitonin--->0.60 Follow blood cultures Consider hematology consult  Lactic acidosis Patient completed the course of antibiotic.  No obvious source of infection was found.  Improving leukocytosis but continue to have elevated WBC.  Sepsis ruled out as there was no source of infection.  If her white cell counts remained elevated she might get benefit from seeing an hematologist, primary care provider can monitor and refer if needed.  Generalized weakness History of falls Secondary to electrolyte derangements, possible occult infection CTA head and neck and MRI brain with no acute abnormalities Chart review reveals history of neuromuscular disorder however no specialist visits related to same, and patient denies knowledge of any such disorder Physical therapist initially recommended short-term rehab but unfortunately we could not find any facility who takes her insurance or she is being discharged home with home health  Ulcerative colitis (HCC) Not acutely flared Continue  mesalamine   HTN  (hypertension) Resume home medications on discharge  COPD (chronic obstructive pulmonary disease) (HCC) Not acutely exacerbated Albuterol  as needed  Alcohol use disorder For counseling on abstinence  Diabetes (HCC) Sliding scale insulin  coverage  Unintentional weight loss Patient endorses unintentional weight loss Recommend age-appropriate screening Heme-onc consult as above  Consultants: None Procedures performed: None Disposition: Home health Diet recommendation:  Discharge Diet Orders (From admission, onward)     Start     Ordered   05/06/23 0000  Diet - low sodium heart healthy        05/06/23 1022           Cardiac and Carb modified diet DISCHARGE MEDICATION: Allergies as of 05/06/2023       Reactions   Ampicillin Anaphylaxis   Erythromycin Anaphylaxis   Omeprazole Anaphylaxis   Penicillins Anaphylaxis, Other (See Comments)   Has patient had a PCN reaction causing immediate rash, facial/tongue/throat swelling, SOB or lightheadedness with hypotension: Yes Has patient had a PCN reaction causing severe rash involving mucus membranes or skin necrosis: No Has patient had a PCN reaction that required hospitalization: Unknown Has patient had a PCN reaction occurring within the last 10 years: Yes If all of the above answers are "NO", then may proceed with Cephalosporin use.   Triamterene-hctz Shortness Of Breath, Swelling   Quinapril Cough        Medication List     STOP taking these medications    bimatoprost 0.03 % ophthalmic solution Commonly known as: LUMIGAN   cyclobenzaprine  5 MG tablet Commonly known as: FLEXERIL    enoxaparin  40 MG/0.4ML injection Commonly known as: LOVENOX    HYDROcodone -acetaminophen  10-325 MG tablet Commonly known as: NORCO       TAKE these medications    acetaminophen  500 MG tablet Commonly known as: TYLENOL  Take 1 tablet (500 mg total) by mouth every 12 (twelve) hours.   albuterol  108 (90 Base) MCG/ACT  inhaler Commonly known as: VENTOLIN  HFA Inhale 2 puffs into the lungs every 4 (four) hours as needed for wheezing or shortness of breath.   allopurinol  100 MG tablet Commonly known as: ZYLOPRIM  Take 100 mg by mouth daily.   ascorbic acid  500 MG tablet Commonly known as: VITAMIN C Take 1 tablet (500 mg total) by mouth daily.   brinzolamide  1 % ophthalmic suspension Commonly known as: AZOPT  Place 1 drop into both eyes 2 (two) times daily.   chlorthalidone  25 MG tablet Commonly known as: HYGROTON  Take 25 mg by mouth daily.   [employer] VITAMIN B12 1000 MCG tablet Generic drug: cyanocobalamin  Take 1,000 mcg by mouth daily.   folic acid  1 MG tablet Commonly known as: FOLVITE  Take 1 tablet (1 mg total) by mouth daily.   gabapentin  300 MG capsule Commonly known as: NEURONTIN  Take 1 capsule (300 mg total) by mouth 3 (three) times daily. What changed: when to take this   Klor-Con  M20 20 MEQ tablet Generic drug: potassium chloride  SA Take 2 tablets (40 mEq total) by mouth daily.   latanoprost  0.005 % ophthalmic solution Commonly known as: XALATAN  Place 1 drop into both eyes at bedtime.   losartan  25 MG tablet Commonly known as: COZAAR  Take 25 mg by mouth daily.   mesalamine  1.2 g EC tablet Commonly known as: LIALDA  Take 2.4 g by mouth daily with breakfast.   metFORMIN  500 MG 24 hr tablet Commonly known as: GLUCOPHAGE -XR Take 500 mg by mouth 2 (two) times daily.   metoprolol  succinate 25 MG 24 hr tablet Commonly  known as: TOPROL -XL Take 25 mg by mouth daily. What changed: Another medication with the same name was removed. Continue taking this medication, and follow the directions you see here.   multivitamin with minerals Tabs tablet Take 1 tablet by mouth daily.   pantoprazole  40 MG tablet Commonly known as: PROTONIX  Take 40 mg by mouth every morning.   pravastatin  10 MG tablet Commonly known as: PRAVACHOL  Take 10 mg by mouth every morning.   sucralfate  1 g  tablet Commonly known as: CARAFATE  Take 1 g by mouth 2 (two) times daily.   thiamine  100 MG tablet Commonly known as: VITAMIN B1 Take 1 tablet (100 mg total) by mouth daily.   Vitamin D -1000 Max St 25 MCG (1000 UT) tablet Generic drug: Cholecalciferol Take 1,000 Units by mouth daily.               Durable Medical Equipment  (From admission, onward)           Start     Ordered   05/04/23 1302  For home use only DME Walker rolling  Once       Question Answer Comment  Walker: With 5 Inch Wheels   Patient needs a walker to treat with the following condition Neuromuscular disorder (HCC)      05/04/23 1301            Follow-up Information     Almetta Armor, MD. Schedule an appointment as soon as possible for a visit in 1 week(s).   Specialty: Family Medicine Contact information: [address] [place] [address] [phone]                Discharge Exam: Cleavon Curls Weights   04/29/23 2157 04/29/23 2202  Weight: 61.2 kg 61.2 kg   General.  Well-developed lady, in no acute distress. Pulmonary.  Lungs clear bilaterally, normal respiratory effort. CV.  Regular rate and rhythm, no JVD, rub or murmur. Abdomen.  Soft, nontender, nondistended, BS positive. CNS.  Alert and oriented .  No focal neurologic deficit. Extremities.  No edema, no cyanosis, pulses intact and symmetrical. Psychiatry.  Judgment and insight appears normal.   Condition at discharge: stable  The results of significant diagnostics from this hospitalization (including imaging, microbiology, ancillary and laboratory) are listed below for reference.   Imaging Studies: DG Chest Port 1 View Result Date: 04/30/2023 CLINICAL DATA:  Weakness, fall EXAM: PORTABLE CHEST 1 VIEW COMPARISON:  CT chest dated 04/09/2022 FINDINGS: Lungs are clear.  No pleural effusion or pneumothorax. The heart is normal in size. Old left posterior rib fracture deformities. IMPRESSION: No acute cardiopulmonary  disease. Electronically Signed   By: Zadie Herter M.D.   On: 04/30/2023 03:19   MR BRAIN WO CONTRAST Result Date: 04/30/2023 CLINICAL DATA:  Initial evaluation for acute neuro deficit, stroke suspected. EXAM: MRI HEAD WITHOUT CONTRAST TECHNIQUE: Multiplanar, multiecho pulse sequences of the brain and surrounding structures were obtained without intravenous contrast. COMPARISON:  CT from earlier the same day. FINDINGS: Brain: Examination degraded by motion artifact. Mild age-related cerebral atrophy. Patchy T2/FLAIR hyperintensity involving the periventricular white matter, most characteristic of chronic microvascular ischemic disease, mild for age. No evidence for acute or subacute ischemia. No areas of chronic cortical infarction. No acute intracranial hemorrhage. Single punctate chronic microhemorrhage noted within the right occipital lobe, of doubtful significance in isolation. No mass lesion, midline shift or mass effect. No hydrocephalus or extra-axial fluid collection. Pituitary gland within normal limits. Vascular: Major intracranial vascular flow voids  are maintained. Skull and upper cervical spine: Craniocervical junction within normal limits. Decreased T1 signal intensity noted within the visualized bone marrow, nonspecific, but most commonly related to anemia, smoking or obesity. No scalp soft tissue abnormality. Sinuses/Orbits: Globes orbital soft tissues within normal limits. Mucosal thickening noted about the right greater than left maxillary sinuses. Paranasal sinuses are otherwise clear. No mastoid effusion. Other: None. IMPRESSION: 1. No acute intracranial abnormality. 2. Mild age-related cerebral atrophy with chronic small vessel ischemic disease. Electronically Signed   By: Virgia Griffins M.D.   On: 04/30/2023 02:56   CT Angio Head Neck W WO CM Result Date: 04/30/2023 CLINICAL DATA:  Initial evaluation for neuro deficit, stroke suspected. EXAM: CT ANGIOGRAPHY HEAD AND NECK WITH  AND WITHOUT CONTRAST TECHNIQUE: Multidetector CT imaging of the head and neck was performed using the standard protocol during bolus administration of intravenous contrast. Multiplanar CT image reconstructions and MIPs were obtained to evaluate the vascular anatomy. Carotid stenosis measurements (when applicable) are obtained utilizing NASCET criteria, using the distal internal carotid diameter as the denominator. RADIATION DOSE REDUCTION: This exam was performed according to the departmental dose-optimization program which includes automated exposure control, adjustment of the mA and/or kV according to patient size and/or use of iterative reconstruction technique. CONTRAST:  75mL OMNIPAQUE  IOHEXOL  350 MG/ML SOLN COMPARISON:  Prior study from 02/15/2018. FINDINGS: CT HEAD FINDINGS Brain: Age-related cerebral atrophy with moderate chronic microvascular ischemic disease. No acute intracranial hemorrhage. No acute large vessel territory infarct. No mass lesion or midline shift. No hydrocephalus or extra-axial fluid collection. Vascular: No hyperdense vessel. Skull: Scalp soft tissues within normal limits.  Calvarium intact. Sinuses/Orbits: Globes orbital soft tissues within normal limits. Visualized paranasal sinuses and mastoid air cells are largely clear. Other: None. Review of the MIP images confirms the above findings CTA NECK FINDINGS Aortic arch: Standard branching. Imaged portion shows no evidence of aneurysm or dissection. No significant stenosis of the major arch vessel origins. Mild aortic atherosclerosis. Right carotid system: No evidence of dissection, stenosis (50% or greater), or occlusion. Left carotid system: No evidence of dissection, stenosis (50% or greater), or occlusion. Vertebral arteries: No evidence of dissection, stenosis (50% or greater), or occlusion. Skeleton: No discrete or worrisome osseous lesions. Other neck: No other acute finding. Upper chest: No other acute finding. Review of the MIP  images confirms the above findings CTA HEAD FINDINGS Anterior circulation: Both internal carotid arteries widely patent to the termini without stenosis. A1 segments widely patent. Normal anterior communicating artery complex. Both anterior cerebral arteries widely patent to their distal aspects without stenosis. No M1 stenosis or occlusion. Normal MCA bifurcations. Distal MCA branches well perfused and symmetric. Posterior circulation: Both V4 segments patent to the vertebrobasilar junction without stenosis. Both PICA origins patent and normal. Basilar widely patent to its distal aspect without stenosis. Superior cerebellar arteries patent bilaterally. Both PCAs primarily supplied via the basilar and are well perfused to there distal aspects. Venous sinuses: Patent allowing for timing the contrast bolus. Anatomic variants: None significant.  No aneurysm. Review of the MIP images confirms the above findings IMPRESSION: CT HEAD: 1. No acute intracranial abnormality. 2. Age-related cerebral atrophy with moderate chronic microvascular ischemic disease. CTA HEAD AND NECK: 1. Negative CTA of the head and neck. No large vessel occlusion, hemodynamically significant stenosis, or other acute vascular abnormality. 2.  Aortic Atherosclerosis (ICD10-I70.0). Electronically Signed   By: Virgia Griffins M.D.   On: 04/30/2023 01:15    Microbiology: Results for orders placed or performed  during the hospital encounter of 04/29/23  Resp panel by RT-PCR (RSV, Flu A&B, Covid) Urine, Clean Catch     Status: None   Collection Time: 04/29/23 10:05 PM   Specimen: Urine, Clean Catch; Nasal Swab  Result Value Ref Range Status   SARS Coronavirus 2 by RT PCR NEGATIVE NEGATIVE Final    Comment: (NOTE) SARS-CoV-2 target nucleic acids are NOT DETECTED.  The SARS-CoV-2 RNA is generally detectable in upper respiratory specimens during the acute phase of infection. The lowest concentration of SARS-CoV-2 viral copies this assay can  detect is 138 copies/mL. A negative result does not preclude SARS-Cov-2 infection and should not be used as the sole basis for treatment or other patient management decisions. A negative result may occur with  improper specimen collection/handling, submission of specimen other than nasopharyngeal swab, presence of viral mutation(s) within the areas targeted by this assay, and inadequate number of viral copies(<138 copies/mL). A negative result must be combined with clinical observations, patient history, and epidemiological information. The expected result is Negative.  Fact Sheet for Patients:  BloggerCourse.com  Fact Sheet for Healthcare Providers:  SeriousBroker.it  This test is no t yet approved or cleared by the [place]  FDA and  has been authorized for detection and/or diagnosis of SARS-CoV-2 by FDA under an Emergency Use Authorization (EUA). This EUA will remain  in effect (meaning this test can be used) for the duration of the COVID-19 declaration under Section 564(b)(1) of the Act, 21 U.S.C.section 360bbb-3(b)(1), unless the authorization is terminated  or revoked sooner.       Influenza A by PCR NEGATIVE NEGATIVE Final   Influenza B by PCR NEGATIVE NEGATIVE Final    Comment: (NOTE) The Xpert Xpress SARS-CoV-2/FLU/RSV plus assay is intended as an aid in the diagnosis of influenza from Nasopharyngeal swab specimens and should not be used as a sole basis for treatment. Nasal washings and aspirates are unacceptable for Xpert Xpress SARS-CoV-2/FLU/RSV testing.  Fact Sheet for Patients: BloggerCourse.com  Fact Sheet for Healthcare Providers: SeriousBroker.it  This test is not yet approved or cleared by the [place]  FDA and has been authorized for detection and/or diagnosis of SARS-CoV-2 by FDA under an Emergency Use Authorization (EUA). This EUA will remain in  effect (meaning this test can be used) for the duration of the COVID-19 declaration under Section 564(b)(1) of the Act, 21 U.S.C. section 360bbb-3(b)(1), unless the authorization is terminated or revoked.     Resp Syncytial Virus by PCR NEGATIVE NEGATIVE Final    Comment: (NOTE) Fact Sheet for Patients: BloggerCourse.com  Fact Sheet for Healthcare Providers: SeriousBroker.it  This test is not yet approved or cleared by the [place]  FDA and has been authorized for detection and/or diagnosis of SARS-CoV-2 by FDA under an Emergency Use Authorization (EUA). This EUA will remain in effect (meaning this test can be used) for the duration of the COVID-19 declaration under Section 564(b)(1) of the Act, 21 U.S.C. section 360bbb-3(b)(1), unless the authorization is terminated or revoked.  Performed at [facility], [address] [place], [place] [address]   Culture, blood (Routine X 2) w Reflex to ID Panel     Status: None   Collection Time: 04/30/23  3:20 AM   Specimen: BLOOD  Result Value Ref Range Status   Specimen Description   Final    BLOOD BLOOD LEFT ARM Performed at [facility], [address], [place], [place] [address]    Special Requests   Final    BOTTLES  DRAWN AEROBIC AND ANAEROBIC Blood Culture results may not be optimal due to an excessive volume of blood received in culture bottles Performed at [facility], [address] [place], [place] [address]    Culture   Final    NO GROWTH 5 DAYS Performed at [facility], [address], [place], [place] [address]    Report Status 05/06/2023 FINAL  Final  Culture, blood (Routine X 2) w Reflex to ID Panel     Status: None   Collection Time: 04/30/23  3:21 AM   Specimen: BLOOD  Result Value Ref Range Status   Specimen Description   Final    BLOOD BLOOD RIGHT ARM Performed at [facility], [address]  [place], [place] [address]    Special Requests   Final    BOTTLES DRAWN AEROBIC AND ANAEROBIC Blood Culture results may not be optimal due to an excessive volume of blood received in culture bottles Performed at [facility], [address], [place], [place] [address]    Culture   Final    NO GROWTH 5 DAYS Performed at [facility], [address], [place], [place] [address]    Report Status 05/06/2023 FINAL  Final    Labs: CBC: Recent Labs  Lab 05/01/23 0422 05/02/23 0604 05/03/23 0441 05/04/23 0348 05/06/23 0448  WBC 16.4* 16.1* 16.4* 16.7* 18.8*  NEUTROABS 11.0* 11.1* 10.6* 10.7*  --   HGB 8.9* 9.3* 9.6* 10.1* 10.9*  HCT 28.2* 28.5* 28.6* 30.7* 34.4*  MCV 92.8 91.3 89.7 90.3 91.2  PLT 296 333 366 379 407*   Basic Metabolic Panel: Recent Labs  Lab 04/29/23 2205 04/30/23 0522 05/01/23 0422 05/01/23 0806 05/01/23 1625 05/02/23 0604 05/03/23 0441 05/04/23 0348 05/06/23 0448  NA 146* 142 138  --   --  142 140 141 135  K 2.7* 3.4* 3.7  --   --  3.4* 3.7 3.4* 3.1*  CL 106 106 103  --   --  104 105 104 101  CO2 13* 25 24  --   --  27 24 25 24   GLUCOSE 130* 340* 138*  --   --  141* 137* 136* 175*  BUN 7* 6* 9  --   --  6* <5* <5* 9  CREATININE 0.52 0.45 0.48  --   --  0.41* 0.31* 0.38* 0.40*  CALCIUM  8.5* 7.8* 8.2*  --   --  8.6* 8.9 8.9 8.4*  MG 1.1* 1.2*  --  1.4* 2.2  --   --   --   --    Liver Function Tests: Recent Labs  Lab 04/29/23 2205  AST 61*  ALT 39  ALKPHOS 197*  BILITOT 0.8  PROT 6.7  ALBUMIN  3.0*   CBG: Recent Labs  Lab 05/05/23 0742 05/05/23 1128 05/05/23 1648 05/05/23 2124 05/06/23 0735  GLUCAP 177* 124* 117* 133* 161*    Discharge time spent: greater than 30 minutes.  This record has been created using Conservation officer, historic buildings. Errors have been sought and corrected,but may not always be located. Such creation errors do not reflect on the standard of care.   Signed: Luna Salinas, MD [facility] 05/06/2023

## 2023-05-06 NOTE — TOC Transition Note (Signed)
 Transition of Care [facility]) - Discharge Note   Patient Details  Name: Sheila [name] MRN: [id number] Date of Birth: November 27, 1960  Transition of Care [facility]) CM/SW Contact:  Alexandra Ice, RN Phone Number: 05/06/2023, 11:07 AM   Clinical Narrative:    Patient to discharge today, home with home health services. RW was delivered by Sam Creighton with [employer].   Attempted to return Alexandria Angel, sister, call; received voicemail. Left message. Contacted Juanita, daughter, spoke to her regarding discharge plan. She stated she is able to pick patient up today, can be here between 1:30-2pm. Notified MD and bedside nurse. Was informed by bedside nurse patient stated her brother was picking her up. TOC stated if patient was arranging her transportation she needed to contact her daughter, because her daughter has an hour drive to come get her. TOC sent message to [place]  with [facility] notifying of patient discharge today. Added [facility] information onto AVS.    Final next level of care: Home w Home Health Services Barriers to Discharge: Barriers Resolved   Patient Goals and CMS Choice Patient states their goals for this hospitalization and ongoing recovery are:: go home CMS Medicare.gov Compare Post Acute Care list provided to:: Patient Choice offered to / list presented to : Patient      Discharge Placement                  Name of family member notified: Family Patient and family notified of of transfer: 05/06/23  Discharge Plan and Services Additional resources added to the After Visit Summary for                  DME Arranged: Walker rolling DME Agency: [employer] Date DME Agency Contacted: 05/06/23 Time DME Agency Contacted: 1106 Representative spoke with at DME Agency: Sam Creighton HH Arranged: PT, OT, RN HH Agency: [facility] Date [facility] Agency Contacted: 05/06/23 Time HH Agency Contacted: 1106 Representative spoke with at [facility] Agency: [place]   Social Drivers of Health  (SDOH) Interventions SDOH Screenings   Food Insecurity: No Food Insecurity (04/30/2023)  Housing: Low Risk  (04/30/2023)  Transportation Needs: No Transportation Needs (04/30/2023)  Utilities: Not At Risk (04/30/2023)  Social Connections: Unknown (04/30/2023)  Tobacco Use: Medium Risk (05/01/2023)     Readmission Risk Interventions     No data to display

## 2023-05-10 ENCOUNTER — Ambulatory Visit: Admission: RE | Admit: 2023-05-10 | Source: Ambulatory Visit

## 2023-05-18 ENCOUNTER — Ambulatory Visit
Admission: RE | Admit: 2023-05-18 | Discharge: 2023-05-18 | Disposition: A | Discharge status: Home / Self Care | Source: Ambulatory Visit | Attending: Acute Care | Admitting: Acute Care

## 2023-05-18 DIAGNOSIS — Z87891 Personal history of nicotine dependence: Secondary | ICD-10-CM | POA: Diagnosis present

## 2023-05-18 DIAGNOSIS — Z122 Encounter for screening for malignant neoplasm of respiratory organs: Secondary | ICD-10-CM | POA: Diagnosis present

## 2023-05-18 DIAGNOSIS — F17210 Nicotine dependence, cigarettes, uncomplicated: Secondary | ICD-10-CM | POA: Diagnosis present

## 2023-06-10 ENCOUNTER — Other Ambulatory Visit: Payer: Self-pay | Admitting: Acute Care

## 2023-06-10 DIAGNOSIS — Z122 Encounter for screening for malignant neoplasm of respiratory organs: Secondary | ICD-10-CM

## 2023-06-10 DIAGNOSIS — Z87891 Personal history of nicotine dependence: Secondary | ICD-10-CM

## 2023-06-10 DIAGNOSIS — F17210 Nicotine dependence, cigarettes, uncomplicated: Secondary | ICD-10-CM

## 2023-09-27 ENCOUNTER — Emergency Department

## 2023-09-27 ENCOUNTER — Other Ambulatory Visit: Payer: Self-pay

## 2023-09-27 ENCOUNTER — Observation Stay
Admission: EM | Admit: 2023-09-27 | Discharge: 2023-09-30 | Disposition: A | Discharge status: Home / Self Care | Attending: Internal Medicine | Admitting: Internal Medicine

## 2023-09-27 DIAGNOSIS — G629 Polyneuropathy, unspecified: Secondary | ICD-10-CM | POA: Insufficient documentation

## 2023-09-27 DIAGNOSIS — J449 Chronic obstructive pulmonary disease, unspecified: Secondary | ICD-10-CM | POA: Insufficient documentation

## 2023-09-27 DIAGNOSIS — D72829 Elevated white blood cell count, unspecified: Secondary | ICD-10-CM | POA: Diagnosis not present

## 2023-09-27 DIAGNOSIS — K5190 Ulcerative colitis, unspecified, without complications: Secondary | ICD-10-CM | POA: Insufficient documentation

## 2023-09-27 DIAGNOSIS — R197 Diarrhea, unspecified: Secondary | ICD-10-CM | POA: Insufficient documentation

## 2023-09-27 DIAGNOSIS — R55 Syncope and collapse: Secondary | ICD-10-CM | POA: Diagnosis not present

## 2023-09-27 DIAGNOSIS — J4489 Other specified chronic obstructive pulmonary disease: Secondary | ICD-10-CM | POA: Insufficient documentation

## 2023-09-27 DIAGNOSIS — R531 Weakness: Secondary | ICD-10-CM | POA: Insufficient documentation

## 2023-09-27 DIAGNOSIS — E1142 Type 2 diabetes mellitus with diabetic polyneuropathy: Secondary | ICD-10-CM | POA: Diagnosis not present

## 2023-09-27 DIAGNOSIS — R112 Nausea with vomiting, unspecified: Secondary | ICD-10-CM | POA: Insufficient documentation

## 2023-09-27 DIAGNOSIS — F1090 Alcohol use, unspecified, uncomplicated: Secondary | ICD-10-CM | POA: Insufficient documentation

## 2023-09-27 DIAGNOSIS — R42 Dizziness and giddiness: Secondary | ICD-10-CM | POA: Diagnosis present

## 2023-09-27 DIAGNOSIS — E876 Hypokalemia: Secondary | ICD-10-CM | POA: Diagnosis not present

## 2023-09-27 DIAGNOSIS — R109 Unspecified abdominal pain: Secondary | ICD-10-CM | POA: Diagnosis present

## 2023-09-27 DIAGNOSIS — I10 Essential (primary) hypertension: Secondary | ICD-10-CM | POA: Diagnosis not present

## 2023-09-27 DIAGNOSIS — Z79899 Other long term (current) drug therapy: Secondary | ICD-10-CM | POA: Insufficient documentation

## 2023-09-27 DIAGNOSIS — E860 Dehydration: Secondary | ICD-10-CM

## 2023-09-27 LAB — CBC WITH DIFFERENTIAL/PLATELET
Abs Immature Granulocytes: 0.26 K/uL — ABNORMAL HIGH (ref 0.00–0.07)
Basophils Absolute: 0.1 K/uL (ref 0.0–0.1)
Basophils Relative: 1 %
Eosinophils Absolute: 0.4 K/uL (ref 0.0–0.5)
Eosinophils Relative: 2 %
HCT: 32.3 % — ABNORMAL LOW (ref 36.0–46.0)
Hemoglobin: 10.6 g/dL — ABNORMAL LOW (ref 12.0–15.0)
Immature Granulocytes: 1 %
Lymphocytes Relative: 20 %
Lymphs Abs: 3.7 K/uL (ref 0.7–4.0)
MCH: 28.9 pg (ref 26.0–34.0)
MCHC: 32.8 g/dL (ref 30.0–36.0)
MCV: 88 fL (ref 80.0–100.0)
Monocytes Absolute: 1.4 K/uL — ABNORMAL HIGH (ref 0.1–1.0)
Monocytes Relative: 8 %
Neutro Abs: 12.6 K/uL — ABNORMAL HIGH (ref 1.7–7.7)
Neutrophils Relative %: 68 %
Platelets: 415 K/uL — ABNORMAL HIGH (ref 150–400)
RBC: 3.67 MIL/uL — ABNORMAL LOW (ref 3.87–5.11)
RDW: 15.6 % — ABNORMAL HIGH (ref 11.5–15.5)
WBC: 18.5 K/uL — ABNORMAL HIGH (ref 4.0–10.5)
nRBC: 0.3 % — ABNORMAL HIGH (ref 0.0–0.2)

## 2023-09-27 LAB — LACTIC ACID, PLASMA
Lactic Acid, Venous: 3.1 mmol/L (ref 0.5–1.9)
Lactic Acid, Venous: 2.6 mmol/L (ref 0.5–1.9)

## 2023-09-27 LAB — COMPREHENSIVE METABOLIC PANEL WITH GFR
ALT: 18 U/L (ref 0–44)
AST: 45 U/L — ABNORMAL HIGH (ref 15–41)
Albumin: 3 g/dL — ABNORMAL LOW (ref 3.5–5.0)
Alkaline Phosphatase: 114 U/L (ref 38–126)
Anion gap: 13 (ref 5–15)
BUN: 22 mg/dL (ref 8–23)
CO2: 19 mmol/L — ABNORMAL LOW (ref 22–32)
Calcium: 8.9 mg/dL (ref 8.9–10.3)
Chloride: 109 mmol/L (ref 98–111)
Creatinine, Ser: 0.96 mg/dL (ref 0.44–1.00)
GFR, Estimated: >60 mL/min (ref >60)
Glucose, Bld: 133 mg/dL — ABNORMAL HIGH (ref 70–99)
Potassium: 2.9 mmol/L — ABNORMAL LOW (ref 3.5–5.1)
Sodium: 141 mmol/L (ref 135–145)
Total Bilirubin: 1.3 mg/dL — ABNORMAL HIGH (ref 0.0–1.2)
Total Protein: 6.5 g/dL (ref 6.5–8.1)

## 2023-09-27 LAB — URINALYSIS, W/ REFLEX TO CULTURE (INFECTION SUSPECTED)
Bilirubin Urine: NEGATIVE
Glucose, UA: NEGATIVE mg/dL
Hgb urine dipstick: NEGATIVE
Ketones, ur: NEGATIVE mg/dL
Leukocytes,Ua: NEGATIVE
Nitrite: NEGATIVE
Protein, ur: NEGATIVE mg/dL
Specific Gravity, Urine: 1.01 (ref 1.005–1.030)
pH: 5 (ref 5.0–8.0)

## 2023-09-27 LAB — SEDIMENTATION RATE: Sed Rate: 8 mm/h (ref 0–30)

## 2023-09-27 LAB — MAGNESIUM
Magnesium: 0.6 mg/dL — CL (ref 1.7–2.4)
Magnesium: 1.9 mg/dL (ref 1.7–2.4)

## 2023-09-27 LAB — PROTIME-INR
INR: 1 (ref 0.8–1.2)
Prothrombin Time: 13.8 s (ref 11.4–15.2)

## 2023-09-27 LAB — PHOSPHORUS: Phosphorus: 1.2 mg/dL — ABNORMAL LOW (ref 2.5–4.6)

## 2023-09-27 LAB — C-REACTIVE PROTEIN: CRP: <0.5 mg/dL (ref <1.0)

## 2023-09-27 LAB — ETHANOL: Alcohol, Ethyl (B): <15 mg/dL (ref <15)

## 2023-09-27 MED ORDER — INFLUENZA VIRUS VACC SPLIT PF (FLUZONE) 0.5 ML IM SUSY: 0.5000 mL | PREFILLED_SYRINGE | INTRAMUSCULAR | Status: DC

## 2023-09-27 MED ORDER — GABAPENTIN 300 MG PO CAPS
300.0000 mg | ORAL_CAPSULE | Freq: Two times a day (BID) | ORAL | Status: DC
Start: 2023-09-27 — End: 2023-09-27

## 2023-09-27 MED ORDER — PANTOPRAZOLE SODIUM 40 MG PO TBEC
40.0000 mg | DELAYED_RELEASE_TABLET | ORAL | Status: DC
Start: 2023-09-28 — End: 2023-09-30
  Administered 2023-09-28 – 2023-09-30 (×3): 40 mg via ORAL
  Filled 2023-09-27 (×3): qty 1

## 2023-09-27 MED ORDER — HYDRALAZINE HCL 20 MG/ML IJ SOLN: 5.0000 mg | Freq: Four times a day (QID) | INTRAMUSCULAR | Status: DC | PRN

## 2023-09-27 MED ORDER — MAGNESIUM SULFATE IN D5W 1-5 GM/100ML-% IV SOLN: 1.0000 g | Freq: Once | INTRAVENOUS | Status: DC

## 2023-09-27 MED ORDER — SUCRALFATE 1 G PO TABS
1.0000 g | ORAL_TABLET | Freq: Two times a day (BID) | ORAL | Status: DC
  Administered 2023-09-27 – 2023-09-30 (×6): 1 g via ORAL
  Filled 2023-09-27 (×6): qty 1

## 2023-09-27 MED ORDER — POTASSIUM CHLORIDE 20 MEQ PO PACK
40.0000 meq | PACK | Freq: Once | ORAL | Status: AC
  Administered 2023-09-27: 40 meq via ORAL
  Filled 2023-09-27: qty 2

## 2023-09-27 MED ORDER — SODIUM CHLORIDE 0.9 % IV BOLUS
1000.0000 mL | Freq: Once | INTRAVENOUS | Status: AC
  Administered 2023-09-27: 1000 mL via INTRAVENOUS

## 2023-09-27 MED ORDER — ACETAMINOPHEN 500 MG PO TABS
500.0000 mg | ORAL_TABLET | Freq: Two times a day (BID) | ORAL | Status: DC
Start: 2023-09-27 — End: 2023-09-27

## 2023-09-27 MED ORDER — PRAVASTATIN SODIUM 20 MG PO TABS
10.0000 mg | ORAL_TABLET | ORAL | Status: DC
  Administered 2023-09-28 – 2023-09-30 (×3): 10 mg via ORAL
  Filled 2023-09-27 (×3): qty 1

## 2023-09-27 MED ORDER — ONDANSETRON HCL 4 MG/2ML IJ SOLN: 4.0000 mg | Freq: Four times a day (QID) | INTRAMUSCULAR | Status: DC | PRN

## 2023-09-27 MED ORDER — ENOXAPARIN SODIUM 40 MG/0.4ML IJ SOSY
40.0000 mg | PREFILLED_SYRINGE | INTRAMUSCULAR | Status: DC
  Administered 2023-09-27 – 2023-09-30 (×4): 40 mg via SUBCUTANEOUS
  Filled 2023-09-27 (×4): qty 0.4

## 2023-09-27 MED ORDER — MAGNESIUM SULFATE 4 GM/100ML IV SOLN
4.0000 g | Freq: Once | INTRAVENOUS | Status: AC
  Administered 2023-09-27: 4 g via INTRAVENOUS
  Filled 2023-09-27: qty 100

## 2023-09-27 MED ORDER — HYDROMORPHONE HCL 1 MG/ML IJ SOLN
0.5000 mg | INTRAMUSCULAR | Status: DC | PRN
  Administered 2023-09-27: 0.5 mg via INTRAVENOUS
  Filled 2023-09-27: qty 0.5

## 2023-09-27 MED ORDER — LATANOPROST 0.005 % OP SOLN
1.0000 [drp] | Freq: Every day | OPHTHALMIC | Status: DC
  Administered 2023-09-27 – 2023-09-29 (×3): 1 [drp] via OPHTHALMIC
  Filled 2023-09-27: qty 2.5

## 2023-09-27 MED ORDER — K PHOS MONO-SOD PHOS DI & MONO 155-852-130 MG PO TABS
500.0000 mg | ORAL_TABLET | Freq: Two times a day (BID) | ORAL | Status: AC
Start: 2023-09-27 — End: 2023-09-28
  Administered 2023-09-27 – 2023-09-28 (×2): 500 mg via ORAL
  Filled 2023-09-27 (×3): qty 2

## 2023-09-27 MED ORDER — POTASSIUM PHOSPHATES 15 MMOLE/5ML IV SOLN
30.0000 mmol | Freq: Once | INTRAVENOUS | Status: AC
  Administered 2023-09-27: 30 mmol via INTRAVENOUS
  Filled 2023-09-27: qty 10

## 2023-09-27 MED ORDER — ALLOPURINOL 100 MG PO TABS
100.0000 mg | ORAL_TABLET | Freq: Every day | ORAL | Status: DC
Start: 2023-09-27 — End: 2023-09-27

## 2023-09-27 MED ORDER — SODIUM CHLORIDE 0.9 % IV BOLUS
1000.0000 mL | Freq: Once | INTRAVENOUS | Status: AC
Start: 2023-09-27 — End: 2023-09-27
  Administered 2023-09-27: 1000 mL via INTRAVENOUS

## 2023-09-27 MED ORDER — ALLOPURINOL 100 MG PO TABS
100.0000 mg | ORAL_TABLET | Freq: Every day | ORAL | Status: DC
  Administered 2023-09-28 – 2023-09-30 (×3): 100 mg via ORAL
  Filled 2023-09-27 (×3): qty 1

## 2023-09-27 MED ORDER — ALBUTEROL SULFATE (2.5 MG/3ML) 0.083% IN NEBU: 2.5000 mg | INHALATION_SOLUTION | RESPIRATORY_TRACT | Status: DC | PRN

## 2023-09-27 MED ORDER — SUCRALFATE 1 G PO TABS: 1.0000 g | ORAL_TABLET | Freq: Two times a day (BID) | ORAL | Status: DC

## 2023-09-27 MED ORDER — ONDANSETRON HCL 4 MG PO TABS: 4.0000 mg | ORAL_TABLET | Freq: Four times a day (QID) | ORAL | Status: DC | PRN

## 2023-09-27 MED ORDER — MESALAMINE 1.2 G PO TBEC
2.4000 g | DELAYED_RELEASE_TABLET | Freq: Every day | ORAL | Status: DC
  Administered 2023-09-28 – 2023-09-30 (×3): 2.4 g via ORAL
  Filled 2023-09-27 (×3): qty 2

## 2023-09-27 MED ORDER — ACETAMINOPHEN 500 MG PO TABS
500.0000 mg | ORAL_TABLET | Freq: Two times a day (BID) | ORAL | Status: DC
  Administered 2023-09-27 – 2023-09-30 (×4): 500 mg via ORAL
  Filled 2023-09-27 (×5): qty 1

## 2023-09-27 MED ORDER — GABAPENTIN 300 MG PO CAPS
300.0000 mg | ORAL_CAPSULE | Freq: Two times a day (BID) | ORAL | Status: DC
  Administered 2023-09-27 – 2023-09-30 (×6): 300 mg via ORAL
  Filled 2023-09-27 (×6): qty 1

## 2023-09-27 MED ORDER — BRINZOLAMIDE 1 % OP SUSP
1.0000 [drp] | Freq: Two times a day (BID) | OPHTHALMIC | Status: DC
  Administered 2023-09-27 – 2023-09-30 (×6): 1 [drp] via OPHTHALMIC
  Filled 2023-09-27: qty 10

## 2023-09-27 NOTE — ED Provider Notes (Signed)
 [facility] Provider Note    Event Date/Time   First MD Initiated Contact with Patient 09/27/23 1237     (approximate)   History   Near Syncope   HPI  Sheila [name] is a 63 y.o. female past medical history significant for COPD, diabetes, alcohol use disorder, ulcerative colitis, presents to the emergency department following an episode of syncope.  Patient states that she has not been feeling well for the Past couple of days.  Complaining of significant diarrhea.  Denies any blood in her stool or melanotic stool.  States that she is having more than 6 episodes of diarrhea a day.  Denies any abdominal pain at this time.  States that she has been having some nausea but no episodes of vomiting.  States that she had a fall yesterday and today secondary to feeling lightheaded with standing.  Did hit her head but states that she does not have a headache at this time.  No chest pain or shortness of breath.  No dizziness or room spinning.  Denies dysuria, urinary urgency or frequency.  When EMS arrived patient had low systolic blood pressure in the 80s.       Physical Exam   Triage Vital Signs: ED Triage Vitals [09/27/23 1236]  Encounter Vitals Group     BP (!) 74/49     Girls Systolic BP Percentile      Girls Diastolic BP Percentile      Boys Systolic BP Percentile      Boys Diastolic BP Percentile      Pulse Rate 83     Resp 17     Temp 97.9 F (36.6 C)     Temp Source Oral     SpO2 98 %     Weight 145 lb (65.8 kg)     Height      Head Circumference      Peak Flow      Pain Score 0     Pain Loc      Pain Education      Exclude from Growth Chart     Most recent vital signs: Vitals:   09/27/23 1400 09/27/23 1505  BP: (!) 125/103 (!) 100/51  Pulse: 72 77  Resp: 17 13  Temp:    SpO2: 100% 100%    Physical Exam Constitutional:      Appearance: [name] well-developed.  HENT:     Head:     Comments: Ecchymosis of the right cheek Eyes:      Extraocular Movements: Extraocular movements intact.     Conjunctiva/sclera: Conjunctivae normal.     Pupils: Pupils are equal, round, and reactive to light.     Comments: No nystagmus  Cardiovascular:     Rate and Rhythm: Regular rhythm.     Pulses: Normal pulses.  Pulmonary:     Effort: No respiratory distress.  Abdominal:     General: There is no distension.     Tenderness: There is no abdominal tenderness. There is no guarding or rebound.  Musculoskeletal:        General: Normal range of motion.     Cervical back: Normal range of motion.     Right lower leg: No edema.     Left lower leg: No edema.  Skin:    General: Skin is warm.     Capillary Refill: Capillary refill takes 2 to 3 seconds.  Neurological:     Mental Status: [name] alert. Mental status  is at baseline.  Psychiatric:        Mood and Affect: Mood normal.     IMPRESSION / MDM / ASSESSMENT AND PLAN / ED COURSE  I reviewed the triage vital signs and the nursing notes.  Differential diagnosis including intracranial hemorrhage, dehydration, C. difficile, viral enteritis, flare of ulcerative colitis, electrolyte abnormality, ACS, anemia  Felt to 30 cc/kg of IV fluids may be detrimental to the patient given that her blood pressure improved following a 500 bolus.  Order 1 L of IV fluids and reevaluate.   EKG  I, Clotilda Punter, the attending physician, personally viewed and interpreted this ECG.  EKG with significant underlying artifact.  Nonspecific ST changes.  Heart rate of 80.  QTc reading is 371  No tachycardic or bradycardic dysrhythmias while on cardiac telemetry.  RADIOLOGY I independently reviewed imaging, my interpretation of imaging: CT scan of the head with no signs of intracranial hemorrhage.  Ventriculomegaly which is stable.  Chest x-ray with no signs of pneumonia with old left clavicle fracture and rib fractures  LABS (all labs ordered are listed, but only abnormal results are  displayed) Labs interpreted as -    Labs Reviewed  LACTIC ACID, PLASMA - Abnormal; Notable for the following components:      Result Value   Lactic Acid, Venous 3.1 (*)    All other components within normal limits  COMPREHENSIVE METABOLIC PANEL WITH GFR - Abnormal; Notable for the following components:   Potassium 2.9 (*)    CO2 19 (*)    Glucose, Bld 133 (*)    Albumin  3.0 (*)    AST 45 (*)    Total Bilirubin 1.3 (*)    All other components within normal limits  CBC WITH DIFFERENTIAL/PLATELET - Abnormal; Notable for the following components:   WBC 18.5 (*)    RBC 3.67 (*)    Hemoglobin 10.6 (*)    HCT 32.3 (*)    RDW 15.6 (*)    Platelets 415 (*)    nRBC 0.3 (*)    Neutro Abs 12.6 (*)    Monocytes Absolute 1.4 (*)    Abs Immature Granulocytes 0.26 (*)    All other components within normal limits  MAGNESIUM  - Abnormal; Notable for the following components:   Magnesium  0.6 (*)    All other components within normal limits  PHOSPHORUS - Abnormal; Notable for the following components:   Phosphorus 1.2 (*)    All other components within normal limits  CULTURE, BLOOD (ROUTINE X 2)  CULTURE, BLOOD (ROUTINE X 2)  GASTROINTESTINAL PANEL BY PCR, STOOL (REPLACES STOOL CULTURE)  C DIFFICILE QUICK SCREEN W PCR REFLEX    PROTIME-INR  SEDIMENTATION RATE  LACTIC ACID, PLASMA  URINALYSIS, W/ REFLEX TO CULTURE (INFECTION SUSPECTED)  C-REACTIVE PROTEIN  MAGNESIUM   ETHANOL     MDM  Patient with chronic leukocytosis, white count today is 18.5.  Anemia but hemoglobin is stable and at her baseline.  Creatinine appears to be at her baseline.  Multiple electrolyte abnormalities including a potassium of 2.9 and a magnesium  level of 0.6.  Does have an elevated lactic acid of 3.1.  Given IV magnesium  replacement and p.o. potassium replacement.  Received a total of 2 L of IV fluids.  Repeat exam abdomen continues to be nontender to palpation.  Low suspicion for intra-abdominal  abscess.  After discussion with the hospitalist given her diarrhea and signs of dehydration we will hold on antibiotics at this time, felt that it was  secondary to dehydration from her diarrhea  Adding on ESR and CRP given her history of ulcerative colitis.  Patient admitted for dehydration, multiple electrolyte abnormality.   3:35 PM On reevaluation significant improvement of her perfusion.  Blood pressure normalized.     PROCEDURES:  Critical Care performed: yes  .Critical Care  Performed by: Suzanne Kirsch, MD Authorized by: Suzanne Kirsch, MD   Critical care provider statement:    Critical care time (minutes):  45   Critical care time was exclusive of:  Separately billable procedures and treating other patients   Critical care was necessary to treat or prevent imminent or life-threatening deterioration of the following conditions:  Circulatory failure and dehydration   Critical care was time spent personally by me on the following activities:  Development of treatment plan with patient or surrogate, discussions with consultants, evaluation of patient's response to treatment, examination of patient, ordering and review of laboratory studies, ordering and review of radiographic studies, ordering and performing treatments and interventions, pulse oximetry, re-evaluation of patient's condition and review of old charts   Care discussed with: admitting provider     Patient's presentation is most consistent with acute presentation with potential threat to life or bodily function.   MEDICATIONS ORDERED IN ED: Medications  magnesium  sulfate IVPB 4 g 100 mL (4 g Intravenous New Bag/Given 09/27/23 1412)  sodium chloride  0.9 % bolus 1,000 mL (1,000 mLs Intravenous New Bag/Given 09/27/23 1306)  potassium chloride  (KLOR-CON ) packet 40 mEq (40 mEq Oral Given 09/27/23 1401)  sodium chloride  0.9 % bolus 1,000 mL (1,000 mLs Intravenous New Bag/Given 09/27/23 1404)    FINAL CLINICAL  IMPRESSION(S) / ED DIAGNOSES   Final diagnoses:  Near syncope  Dehydration  Hypokalemia  Diarrhea, unspecified type  Hypomagnesemia     Rx / DC Orders   ED Discharge Orders     None        Note:  This document was prepared using Dragon voice recognition software and may include unintentional dictation errors.   Suzanne Kirsch, MD 09/27/23 1535

## 2023-09-27 NOTE — ED Triage Notes (Addendum)
 Pt arrives from home via EMS. Pt was transfering from her bedside commode back to her bed when she became very dizzy and lightheaded. Per EMS pt systolic bp was in the 80s. Pt was given a fluid bolus via 22g in left wrist. Pt is unsure if she took to much of her BP meds today.

## 2023-09-27 NOTE — H&P (Signed)
 History and Physical    Sheila [name] FMW:[id number] DOB: [birth year] DOA: 09/27/2023  PCP: Ricard Tawni KIDD, MD (Confirm with patient/family/NH records and if not entered, this has to be entered at [facility] point of entry) Patient coming from: Home  I have personally briefly reviewed patient's old medical records in [facility]  Chief Complaint: Abdominal pain, watery diarrhea  HPI: Sheila [name] is a 63 y.o. female with medical history significant of ulcerative colitis on mesalamine  daily, HTN, gout, peripheral neuropathy, GERD, presented with persistent abdominal pain and diarrhea.  Symptoms started about 1 week ago, patient started develop intermittent cramping-like abdominal pain and watery diarrhea, as severe as 5-6 episodes a day, with strong tenesmus and occasional chills but no fever.  Denied any nauseous vomiting her appetite has significant decreased over the last 5 to 6 days.  Last 2 days she has been feeling significant generalized weakness and started to feel lightheadedness this morning and decided to come to the hospital. ED Course: Blood pressure 70/49, nontachycardic, O2 saturation 100% room air.  Blood work showed K2.9, magnesium  0.8, lactic acid 1.1, phosphorus 1.2.  Patient was given IV fluid potassium and magnesium  and Phos replacement  Review of Systems: As per HPI otherwise 14 point review of systems negative.    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   Renal disorder     Ulcerative colitis (HCC)    on mesalamine     Past Surgical History:  Procedure Laterality Date   COLONOSCOPY WITH PROPOFOL  N/A 08/19/2015   Procedure: COLONOSCOPY WITH PROPOFOL ;  Surgeon: Gladis RAYMOND Mariner, MD;  Location: [facility] ENDOSCOPY;  Service: Endoscopy;  Laterality: N/A;   COLONOSCOPY WITH PROPOFOL  N/A 12/08/2015   Procedure: COLONOSCOPY WITH PROPOFOL ;  Surgeon: Gladis RAYMOND Mariner, MD;  Location: [facility] ENDOSCOPY;  Service: Endoscopy;  Laterality: N/A;   COLONOSCOPY WITH PROPOFOL  N/A 12/09/2015   Procedure: COLONOSCOPY WITH PROPOFOL ;  Surgeon: Gladis RAYMOND Mariner, MD;  Location: [facility] ENDOSCOPY;  Service: Endoscopy;  Laterality: N/A;   COLONOSCOPY WITH PROPOFOL  N/A 04/29/2021   Procedure: COLONOSCOPY WITH PROPOFOL ;  Surgeon: Toledo, Ladell POUR, MD;  Location: [facility] ENDOSCOPY;  Service: Gastroenterology;  Laterality: N/A;  DM   ESOPHAGOGASTRODUODENOSCOPY (EGD) WITH PROPOFOL  N/A 11/08/2017   Procedure: ESOPHAGOGASTRODUODENOSCOPY (EGD) WITH PROPOFOL ;  Surgeon: Mariner Gladis RAYMOND, MD;  Location: [facility] ENDOSCOPY;  Service: Endoscopy;  Laterality: N/A;   FRACTURE SURGERY     JOINT REPLACEMENT Left 04/19/2006   JOINT REPLACEMENT Right 01/14/2015   ORIF FEMUR FRACTURE Left 02/05/2019   ORIF FEMUR FRACTURE Left 02/05/2019   Procedure: OPEN REDUCTION INTERNAL FIXATION (ORIF) DISTAL FEMUR FRACTURE;  Surgeon: Celena Sharper, MD;  Location: [facility] OR;  Service: Orthopedics;  Laterality: Left;   TOTAL KNEE ARTHROPLASTY Left 2008   TOTAL KNEE ARTHROPLASTY Right 01/14/2015   Procedure: TOTAL KNEE ARTHROPLASTY;  Surgeon: Sharper Flake, MD;  Location: [facility] ORS;  Service: Orthopedics;  Laterality: Right;  reports that she quit smoking about 45 years ago. Her smoking use included cigarettes. She has never used smokeless tobacco. She reports current alcohol use. She reports that she does not use drugs.  Allergies  Allergen Reactions   Ampicillin Anaphylaxis   Erythromycin Anaphylaxis   Omeprazole Anaphylaxis    Penicillins Anaphylaxis and Other (See Comments)    Has patient had a PCN reaction causing immediate rash, facial/tongue/throat swelling, SOB or lightheadedness with hypotension: Yes Has patient had a PCN reaction causing severe rash involving mucus membranes or skin necrosis: No Has patient had a PCN reaction that required hospitalization: Unknown Has patient had a PCN reaction occurring within the last 10 years: Yes If all of the above answers are NO, then may proceed with Cephalosporin use.    Triamterene-Hctz Shortness Of Breath and Swelling   Quinapril Cough    Family History  Problem Relation Age of Onset   Hypertension Mother    Diabetes Father    Hypertension Father    Diabetes Brother    Hypertension Brother    Breast cancer Neg Hx      Prior to Admission medications   Medication Sig Start Date End Date Taking? Authorizing Provider  acetaminophen  (TYLENOL ) 500 MG tablet Take 1 tablet (500 mg total) by mouth every 12 (twelve) hours. 02/07/19   Deward Eck, PA-C  albuterol  (PROVENTIL  HFA;VENTOLIN  HFA) 108 (90 BASE) MCG/ACT inhaler Inhale 2 puffs into the lungs every 4 (four) hours as needed for wheezing or shortness of breath.    [provider]  allopurinol  (ZYLOPRIM ) 100 MG tablet Take 100 mg by mouth daily. 08/04/17   [provider]  ascorbic acid  (VITAMIN C) 500 MG tablet Take 1 tablet (500 mg total) by mouth daily. 02/07/19   Deward Eck, PA-C  brinzolamide  (AZOPT ) 1 % ophthalmic suspension Place 1 drop into both eyes 2 (two) times daily.    [provider]  chlorthalidone  (HYGROTON ) 25 MG tablet Take 25 mg by mouth daily.  01/04/15   [provider]  [employer] VITAMIN B12 1000 MCG tablet Take 1,000 mcg by mouth daily. 07/20/17   [provider]  folic acid  (FOLVITE ) 1 MG tablet Take 1 tablet (1 mg total) by mouth daily. 02/17/18   Gouru, Aruna, MD  gabapentin  (NEURONTIN ) 300 MG capsule Take 1 capsule (300 mg total) by mouth 3 (three)  times daily. Patient taking differently: Take 300 mg by mouth 2 (two) times daily. 02/16/18   Gouru, Aruna, MD  KLOR-CON  M20 20 MEQ tablet Take 2 tablets (40 mEq total) by mouth daily. 02/16/18   Gouru, Aruna, MD  latanoprost  (XALATAN ) 0.005 % ophthalmic solution Place 1 drop into both eyes at bedtime.    [provider]  losartan  (COZAAR ) 25 MG tablet Take 25 mg by mouth daily.     [provider]  mesalamine  (LIALDA ) 1.2 g EC tablet Take 2.4 g by mouth daily with breakfast.    [provider]  metFORMIN  (GLUCOPHAGE -XR) 500 MG 24 hr tablet Take 500 mg by mouth 2 (two) times daily.    [provider]  metoprolol  succinate (TOPROL -XL) 25 MG 24 hr tablet Take 25 mg by mouth daily.    [provider]  Multiple Vitamin (MULTIVITAMIN WITH MINERALS) TABS tablet Take 1 tablet by mouth daily. Patient not taking: Reported on 04/30/2023 02/17/18   Gouru, Aruna, MD  pantoprazole  (PROTONIX ) 40 MG tablet Take 40 mg by mouth every morning.    [provider]  pravastatin  (PRAVACHOL )  10 MG tablet Take 10 mg by mouth every morning.     [provider]  sucralfate  (CARAFATE ) 1 g tablet Take 1 g by mouth 2 (two) times daily.    [provider]  thiamine  100 MG tablet Take 1 tablet (100 mg total) by mouth daily. 02/17/18   Gouru, Aruna, MD  VITAMIN D -1000 MAX ST 25 MCG (1000 UT) tablet Take 1,000 Units by mouth daily. 02/18/23   [provider]    Physical Exam: Vitals:   09/27/23 1245 09/27/23 1315 09/27/23 1400 09/27/23 1505  BP:  (!) 100/52 (!) 125/103 (!) 100/51  Pulse: 82 75 72 77  Resp: 13 13 17 13   Temp:      TempSrc:      SpO2: 100% 100% 100% 100%  Weight:        Constitutional: NAD, calm, comfortable Vitals:   09/27/23 1245 09/27/23 1315 09/27/23 1400 09/27/23 1505  BP:  (!) 100/52 (!) 125/103 (!) 100/51  Pulse: 82 75 72 77  Resp: 13 13 17 13   Temp:      TempSrc:      SpO2: 100% 100% 100% 100%  Weight:        Eyes: PERRL, lids and conjunctivae normal ENMT: Mucous membranes are moist. Posterior pharynx clear of any exudate or lesions.Normal dentition.  Neck: normal, supple, no masses, no thyromegaly Respiratory: clear to auscultation bilaterally, no wheezing, no crackles. Normal respiratory effort. No accessory muscle use.  Cardiovascular: Regular rate and rhythm, no murmurs / rubs / gallops. No extremity edema. 2+ pedal pulses. No carotid bruits.  Abdomen: Mild tenderness on periumbilical area, no rebound no guarding, no masses palpated. No hepatosplenomegaly. Bowel sounds positive.  Musculoskeletal: no clubbing / cyanosis. No joint deformity upper and lower extremities. Good ROM, no contractures. Normal muscle tone.  Skin: no rashes, lesions, ulcers. No induration Neurologic: CN 2-12 grossly intact. Sensation intact, DTR normal. Strength 5/5 in all 4.  Psychiatric: Normal judgment and insight. Alert and oriented x 3. Normal mood.     Labs on Admission: I have personally reviewed following labs and imaging studies  CBC: Recent Labs  Lab 09/27/23 1300  WBC 18.5*  NEUTROABS 12.6*  HGB 10.6*  HCT 32.3*  MCV 88.0  PLT 415*   Basic Metabolic Panel: Recent Labs  Lab 09/27/23 1300  NA 141  K 2.9*  CL 109  CO2 19*  GLUCOSE 133*  BUN 22  CREATININE 0.96  CALCIUM  8.9  MG 0.6*  PHOS 1.2*   GFR: Estimated Creatinine Clearance: 51.4 mL/min (by C-G formula based on SCr of 0.96 mg/dL). Liver Function Tests: Recent Labs  Lab 09/27/23 1300  AST 45*  ALT 18  ALKPHOS 114  BILITOT 1.3*  PROT 6.5  ALBUMIN  3.0*   No results for input(s): LIPASE, AMYLASE in the last 168 hours. No results for input(s): AMMONIA in the last 168 hours. Coagulation Profile: Recent Labs  Lab 09/27/23 1300  INR 1.0   Cardiac Enzymes: No results for input(s): CKTOTAL, CKMB, CKMBINDEX, TROPONINI in the last 168 hours. BNP (last 3 results) No results for input(s): PROBNP in the last  8760 hours. HbA1C: No results for input(s): HGBA1C in the last 72 hours. CBG: No results for input(s): GLUCAP in the last 168 hours. Lipid Profile: No results for input(s): CHOL, HDL, LDLCALC, TRIG, CHOLHDL, LDLDIRECT in the last 72 hours. Thyroid  Function Tests: No results for input(s): TSH, T4TOTAL, FREET4, T3FREE, THYROIDAB in the last 72 hours. Anemia Panel: No results for  input(s): VITAMINB12, FOLATE, FERRITIN, TIBC, IRON, RETICCTPCT in the last 72 hours. Urine analysis:    Component Value Date/Time   COLORURINE YELLOW (A) 04/29/2023 2203   APPEARANCEUR CLEAR (A) 04/29/2023 2203   LABSPEC 1.010 04/29/2023 2203   PHURINE 5.0 04/29/2023 2203   GLUCOSEU NEGATIVE 04/29/2023 2203   HGBUR NEGATIVE 04/29/2023 2203   BILIRUBINUR NEGATIVE 04/29/2023 2203   KETONESUR 5 (A) 04/29/2023 2203   PROTEINUR NEGATIVE 04/29/2023 2203   NITRITE NEGATIVE 04/29/2023 2203   LEUKOCYTESUR NEGATIVE 04/29/2023 2203    Radiological Exams on Admission: CT Head Wo Contrast Result Date: 09/27/2023 CLINICAL DATA:  Fall from bedside commode, head injury EXAM: CT HEAD WITHOUT CONTRAST TECHNIQUE: Contiguous axial images were obtained from the base of the skull through the vertex without intravenous contrast. RADIATION DOSE REDUCTION: This exam was performed according to the departmental dose-optimization program which includes automated exposure control, adjustment of the mA and/or kV according to patient size and/or use of iterative reconstruction technique. COMPARISON:  Prior MRI head 04/30/2023; CT scan of the head 04/30/2023 FINDINGS: Brain: No evidence of acute infarction, hemorrhage, hydrocephalus, extra-axial collection or mass lesion/mass effect. Periventricular white matter hypoattenuation is stable compared to prior and remains most consistent with chronic microvascular ischemic white matter disease. Mild ventriculomegaly likely due to central atrophy. Vascular: No  hyperdense vessel or unexpected calcification. Skull: Normal. Negative for fracture or focal lesion. Sinuses/Orbits: No acute finding. Other: None. IMPRESSION: 1. No acute intracranial abnormality. 2. Mild ventriculomegaly is stable compared to prior and likely reflects central atrophy. 3. Chronic microvascular ischemic white matter disease. Electronically Signed   By: Wilkie Lent M.D.   On: 09/27/2023 14:46   DG Chest Port 1 View Result Date: 09/27/2023 CLINICAL DATA:  Questionable sepsis-evaluate for abnormality. EXAM: PORTABLE CHEST 1 VIEW COMPARISON:  Chest CT 05/18/2023. Radiographs 04/30/2023 and 02/04/2019. FINDINGS: 1316 hours. The heart size and mediastinal contours are normal. The lungs are clear. There is no pleural effusion or pneumothorax. No acute osseous findings are identified. There are old left clavicle and rib fractures. IMPRESSION: No evidence of active cardiopulmonary process. Old left clavicle and rib fractures. Electronically Signed   By: Elsie Perone M.D.   On: 09/27/2023 13:54    EKG: Independently reviewed.  Sinus rhythm, no acute ST changes  Assessment/Plan Principal Problem:   Hypomagnesemia Active Problems:   Hypokalemia   Generalized weakness  (please populate well all problems here in Problem List. (For example, if patient is on BP meds at home and you resume or decide to hold them, it is a problem that needs to be her. Same for CAD, COPD, HLD and so on)  Acute diarrhea - Clinically suspect infectious diarrhea given there is a concurrent tenesmus - Hold off antibiotics, C. difficile study and GI pathogen panel ordered.  Patient however does not have any diarrhea since coming to the hospital this morning. - Given the benign course of her illness, and benign abdominal physical exam, we will hold off image study at this point. - Other DDx, she has a normal ESR and she described the symptoms from past UC flareup be very different from this time, clinically has  low suspicion for UC flareup.  Will not escalate UC treatment at this point.  Severe hypomagnesemia - Secondary to diarrhea - IV replacement, recheck level this evening  Severe hypophosphatemia - Secondary to diarrhea - IV and p.o. replacement, recheck level tomorrow morning  Hypokalemia - Secondary to diarrhea and severe hypomagnesemia and hypophosphatemia - P.o. replacement,  recheck level tomorrow  Hypotension Near syncope Generalized weakness - Secondary to dehydration and severe volume contraction from diarrhea - Continue gentle IV fluid Hold off BP meds-start as needed hydralazine   Leukocytosis - More chronic, without significant elevation of WBC -Nature of  that acute diarrhea is less clear, plan to hold off antibiotics for now.   Asthma/COPD - Stable  Hx o UC - As discussed above, will continue mesalamine   Total time spent on patient care 75 minutes.  DVT prophylaxis: Lovenox  Code Status: Full code Family Communication: None at bedside Disposition Plan: Expect less than 2 midnight hospital stay Consults called: None Admission status: Telemetry observation   Cort ONEIDA Mana MD [facility] Pager [phone]  09/27/2023, 3:50 PM

## 2023-09-27 NOTE — ED Notes (Signed)
 Called CCMD to place pt on central monitoring

## 2023-09-28 LAB — BLOOD CULTURE ID PANEL (REFLEXED) - BCID2

## 2023-09-28 LAB — CBC
HCT: 24.3 % — ABNORMAL LOW (ref 36.0–46.0)
Hemoglobin: 8.2 g/dL — ABNORMAL LOW (ref 12.0–15.0)
MCH: 29 pg (ref 26.0–34.0)
MCHC: 33.7 g/dL (ref 30.0–36.0)
MCV: 85.9 fL (ref 80.0–100.0)
Platelets: 275 K/uL (ref 150–400)
RBC: 2.83 MIL/uL — ABNORMAL LOW (ref 3.87–5.11)
RDW: 15.1 % (ref 11.5–15.5)
WBC: 12.3 K/uL — ABNORMAL HIGH (ref 4.0–10.5)
nRBC: 0.2 % (ref 0.0–0.2)

## 2023-09-28 LAB — BASIC METABOLIC PANEL WITH GFR
Anion gap: 10 (ref 5–15)
BUN: 13 mg/dL (ref 8–23)
CO2: 19 mmol/L — ABNORMAL LOW (ref 22–32)
Calcium: 7.3 mg/dL — ABNORMAL LOW (ref 8.9–10.3)
Chloride: 110 mmol/L (ref 98–111)
Creatinine, Ser: 0.49 mg/dL (ref 0.44–1.00)
GFR, Estimated: >60 mL/min (ref >60)
Glucose, Bld: 114 mg/dL — ABNORMAL HIGH (ref 70–99)
Potassium: 3.2 mmol/L — ABNORMAL LOW (ref 3.5–5.1)
Sodium: 139 mmol/L (ref 135–145)

## 2023-09-28 LAB — GASTROINTESTINAL PANEL BY PCR, STOOL (REPLACES STOOL CULTURE)

## 2023-09-28 LAB — C DIFFICILE QUICK SCREEN W PCR REFLEX
C Diff antigen: NEGATIVE
C Diff interpretation: NOT DETECTED
C Diff toxin: NEGATIVE

## 2023-09-28 LAB — MAGNESIUM: Magnesium: 1.3 mg/dL — ABNORMAL LOW (ref 1.7–2.4)

## 2023-09-28 LAB — PHOSPHORUS: Phosphorus: 4.7 mg/dL — ABNORMAL HIGH (ref 2.5–4.6)

## 2023-09-28 MED ORDER — LOPERAMIDE HCL 2 MG PO CAPS
2.0000 mg | ORAL_CAPSULE | ORAL | Status: DC | PRN
  Administered 2023-09-28: 2 mg via ORAL
  Filled 2023-09-28: qty 1

## 2023-09-28 MED ORDER — LOPERAMIDE HCL 2 MG PO CAPS
4.0000 mg | ORAL_CAPSULE | Freq: Once | ORAL | Status: AC
  Administered 2023-09-28: 4 mg via ORAL
  Filled 2023-09-28: qty 2

## 2023-09-28 MED ORDER — LACTATED RINGERS IV SOLN: INTRAVENOUS | Status: DC

## 2023-09-28 MED ORDER — POTASSIUM CHLORIDE 20 MEQ PO PACK
40.0000 meq | PACK | ORAL | Status: AC
  Administered 2023-09-28 (×2): 40 meq via ORAL
  Filled 2023-09-28 (×2): qty 2

## 2023-09-28 MED ORDER — MAGNESIUM SULFATE 4 GM/100ML IV SOLN
4.0000 g | Freq: Once | INTRAVENOUS | Status: AC
  Administered 2023-09-28: 4 g via INTRAVENOUS
  Filled 2023-09-28: qty 100

## 2023-09-28 NOTE — TOC CM/SW Note (Signed)
 Transition of Care [facility]) - Inpatient Brief Assessment   Patient Details  Name: Sheila [name] MRN: [id number] Date of Birth: [birth year]  [facility] CM/SW Contact:    Alfonso Rummer, LCSW Phone Number: 09/28/2023, 4:45 PM   Clinical Narrative: Sheila [name] Rummer completed TOC chart review. No TOC needs identified. Please contact should needs arise.    Transition of Care Asessment:

## 2023-09-28 NOTE — Progress Notes (Signed)
 PHARMACY - PHYSICIAN COMMUNICATION CRITICAL VALUE ALERT - BLOOD CULTURE IDENTIFICATION (BCID)  Sheila [name] is an 63 y.o. female who presented to [facility] on 09/27/2023 with a chief complaint of dizzy, near-syncope  Assessment:  9/23 blood cultures with GPC in 1 of 4 bottles,  BCID detects staphylococcus species (Not S. Aureus).    Name of physician (or Provider) Contacted: Dr Jens  Current antibiotics: none   Changes to prescribed antibiotics recommended:  Likely contaminant - monitor off antibiotic  Results for orders placed or performed during the hospital encounter of 09/27/23  Blood Culture ID Panel (Reflexed) (Collected: 09/27/2023  1:00 PM)  Result Value Ref Range   Enterococcus faecalis NOT DETECTED NOT DETECTED   Enterococcus Faecium NOT DETECTED NOT DETECTED   Listeria monocytogenes NOT DETECTED NOT DETECTED   Staphylococcus species DETECTED (A) NOT DETECTED   Staphylococcus aureus (BCID) NOT DETECTED NOT DETECTED   Staphylococcus epidermidis NOT DETECTED NOT DETECTED   Staphylococcus lugdunensis NOT DETECTED NOT DETECTED   Streptococcus species NOT DETECTED NOT DETECTED   Streptococcus agalactiae NOT DETECTED NOT DETECTED   Streptococcus pneumoniae NOT DETECTED NOT DETECTED   Streptococcus pyogenes NOT DETECTED NOT DETECTED   A.calcoaceticus-baumannii NOT DETECTED NOT DETECTED   Bacteroides fragilis NOT DETECTED NOT DETECTED   Enterobacterales NOT DETECTED NOT DETECTED   Enterobacter cloacae complex NOT DETECTED NOT DETECTED   Escherichia coli NOT DETECTED NOT DETECTED   Klebsiella aerogenes NOT DETECTED NOT DETECTED   Klebsiella oxytoca NOT DETECTED NOT DETECTED   Klebsiella pneumoniae NOT DETECTED NOT DETECTED   Proteus species NOT DETECTED NOT DETECTED   Salmonella species NOT DETECTED NOT DETECTED   Serratia marcescens NOT DETECTED NOT DETECTED   Haemophilus influenzae NOT DETECTED NOT DETECTED   Neisseria meningitidis NOT DETECTED NOT DETECTED    Pseudomonas aeruginosa NOT DETECTED NOT DETECTED   Stenotrophomonas maltophilia NOT DETECTED NOT DETECTED   Candida albicans NOT DETECTED NOT DETECTED   Candida auris NOT DETECTED NOT DETECTED   Candida glabrata NOT DETECTED NOT DETECTED   Candida krusei NOT DETECTED NOT DETECTED   Candida parapsilosis NOT DETECTED NOT DETECTED   Candida tropicalis NOT DETECTED NOT DETECTED   Cryptococcus neoformans/gattii NOT DETECTED NOT DETECTED    Celestine Slovak, PharmD, BCPS, BCIDP Work Cell: [phone] 09/28/2023 9:05 AM

## 2023-09-28 NOTE — Progress Notes (Addendum)
 Progress Note    Sheila [name]  FMW:[id number] DOB: 1960/05/25  DOA: 09/27/2023 PCP: Ricard Tawni KIDD, MD      Brief Narrative:    Medical records reviewed and are as summarized below:  Sheila [name] is a 63 y.o. female  with medical history significant of ulcerative colitis on mesalamine  daily, HTN, gout, peripheral neuropathy, GERD, presented with persistent nausea, abdominal pain and diarrhea.  Symptoms started about a week prior to admission.  She has had multiple watery stools today with abdominal cramping.  She was found to have significant abnormal electrolytes, hypotension.    Assessment/Plan:   Principal Problem:   Hypomagnesemia Active Problems:   Hypokalemia   Generalized weakness    Body mass index is 28.32 kg/m.    Abdominal pain and diarrhea, probably due to gastroenteritis: Continue IV fluids for hydration.  Imodium  as needed for diarrhea.  Analgesics as needed for pain. Stool for C. difficile toxin was negative.  Stool for GI panel was negative. 1 out of 4 blood culture bottles positive for Staphylococcus species but this is likely a contaminant.  Negative for Staph aureus and Staph epidermidis.   Leukocytosis: Improving.   Hypotension: Improved Lactic acidosis:Improving.   Hypokalemia: Improving.  Continue potassium repletion Hypomagnesemia: Improved.  Continue repletion with IV magnesium  sulfate Hypophosphatemia: Improved   History of asthma and COPD: Stable History of ulcerative colitis: Stable.  Continue mesalamine     Diet Order             Diet Heart Room service appropriate? Yes; Fluid consistency: Thin  Diet effective now                                  Consultants: None  Procedures: None    Medications:    acetaminophen   500 mg Oral Q12H   allopurinol   100 mg Oral Daily   brinzolamide   1 drop Both Eyes BID   enoxaparin  (LOVENOX ) injection  40 mg Subcutaneous Q24H   gabapentin   300  mg Oral BID   influenza vac split trivalent PF  0.5 mL Intramuscular Tomorrow-1000   latanoprost   1 drop Both Eyes QHS   mesalamine   2.4 g Oral Q breakfast   pantoprazole   40 mg Oral BH-q7a   pravastatin   10 mg Oral BH-q7a   sucralfate   1 g Oral BID   Continuous Infusions:  lactated ringers        Anti-infectives (From admission, onward)    None              Family Communication/Anticipated D/C date and plan/Code Status   DVT prophylaxis: enoxaparin  (LOVENOX ) injection 40 mg Start: 09/27/23 1545     Code Status: Full Code  Family Communication: None Disposition Plan: Plan to discharge home   Status is: Observation The patient will require care spanning > 2 midnights and should be moved to inpatient because: Diarrhea with significant electrolyte abnormalities       Subjective:   Interval events noted.  [name] still having watery stools.  Stools are nonbloody.  [name] also having intermittent abdominal pain.  No vomiting.  Objective:    Vitals:   09/28/23 0800 09/28/23 0801 09/28/23 0802 09/28/23 1545  BP: 96/62 101/65 (!) 136/59 115/74  Pulse:    87  Resp:    15  Temp:    (!) 97.4 F (36.3 C)  TempSrc:    Oral  SpO2:  100%  Weight:      Height:       No data found.   Intake/Output Summary (Last 24 hours) at 09/28/2023 1655 Last data filed at 09/28/2023 1351 Gross per 24 hour  Intake 240 ml  Output 400 ml  Net -160 ml   Filed Weights   09/27/23 1236  Weight: 65.8 kg    Exam:  GEN: NAD SKIN: Warm and dry EYES: No pallor or icterus ENT: MMM CV: RRR PULM: CTA B ABD: soft, ND, NT, +BS CNS: AAO x 3, non focal EXT: No edema or tenderness        Data Reviewed:   I have personally reviewed following labs and imaging studies:  Labs: Labs show the following:   Basic Metabolic Panel: Recent Labs  Lab 09/27/23 1300 09/27/23 2103 09/28/23 0622  NA 141  --  139  K 2.9*  --  3.2*  CL 109  --  110  CO2 19*  --  19*   GLUCOSE 133*  --  114*  BUN 22  --  13  CREATININE 0.96  --  0.49  CALCIUM  8.9  --  7.3*  MG 0.6* 1.9 1.3*  PHOS 1.2*  --  4.7*   GFR Estimated Creatinine Clearance: 61.7 mL/min (by C-G formula based on SCr of 0.49 mg/dL). Liver Function Tests: Recent Labs  Lab 09/27/23 1300  AST 45*  ALT 18  ALKPHOS 114  BILITOT 1.3*  PROT 6.5  ALBUMIN  3.0*   No results for input(s): LIPASE, AMYLASE in the last 168 hours. No results for input(s): AMMONIA in the last 168 hours. Coagulation profile Recent Labs  Lab 09/27/23 1300  INR 1.0    CBC: Recent Labs  Lab 09/27/23 1300 09/28/23 0622  WBC 18.5* 12.3*  NEUTROABS 12.6*  --   HGB 10.6* 8.2*  HCT 32.3* 24.3*  MCV 88.0 85.9  PLT 415* 275   Cardiac Enzymes: No results for input(s): CKTOTAL, CKMB, CKMBINDEX, TROPONINI in the last 168 hours. BNP (last 3 results) No results for input(s): PROBNP in the last 8760 hours. CBG: No results for input(s): GLUCAP in the last 168 hours. D-Dimer: No results for input(s): DDIMER in the last 72 hours. Hgb A1c: No results for input(s): HGBA1C in the last 72 hours. Lipid Profile: No results for input(s): CHOL, HDL, LDLCALC, TRIG, CHOLHDL, LDLDIRECT in the last 72 hours. Thyroid  function studies: No results for input(s): TSH, T4TOTAL, T3FREE, THYROIDAB in the last 72 hours.  Invalid input(s): FREET3 Anemia work up: No results for input(s): VITAMINB12, FOLATE, FERRITIN, TIBC, IRON, RETICCTPCT in the last 72 hours. Sepsis Labs: Recent Labs  Lab 09/27/23 1249 09/27/23 1300 09/27/23 1447 09/28/23 0622  WBC  --  18.5*  --  12.3*  LATICACIDVEN 3.1*  --  2.6*  --     Microbiology Recent Results (from the past 240 hours)  Blood Culture (routine x 2)     Status: None (Preliminary result)   Collection Time: 09/27/23  1:00 PM   Specimen: BLOOD  Result Value Ref Range Status   Specimen Description BLOOD RIGHT ANTECUBITAL  Final    Special Requests   Final    BOTTLES DRAWN AEROBIC AND ANAEROBIC Blood Culture adequate volume   Culture   Final    NO GROWTH < 24 HOURS Performed at [facility], [address] [place], [place] [address]    Report Status PENDING  Incomplete  Blood Culture (routine x 2)     Status: None (Preliminary  result)   Collection Time: 09/27/23  1:00 PM   Specimen: BLOOD  Result Value Ref Range Status   Specimen Description   Final    BLOOD LEFT ANTECUBITAL Performed at [facility], [address] [place], [place] [address]    Special Requests   Final    BOTTLES DRAWN AEROBIC AND ANAEROBIC Blood Culture results may not be optimal due to an inadequate volume of blood received in culture bottles Performed at [facility], [address] [place], [place] [address]    Culture  Setup Time   Final    GRAM POSITIVE COCCI AEROBIC BOTTLE ONLY Organism ID to follow CRITICAL RESULT CALLED TO, READ BACK BY AND VERIFIED WITH: KRISTIN MERRILL PHARMD [id number] 09/28/23 HNM GRAM STAIN REVIEWED-AGREE WITH RESULT DRT Performed at [facility], [address], [place], [place] [address]    Culture GRAM POSITIVE COCCI  Final   Report Status PENDING  Incomplete  Blood Culture ID Panel (Reflexed)     Status: Abnormal   Collection Time: 09/27/23  1:00 PM  Result Value Ref Range Status   Enterococcus faecalis NOT DETECTED NOT DETECTED Final   Enterococcus Faecium NOT DETECTED NOT DETECTED Final   Listeria monocytogenes NOT DETECTED NOT DETECTED Final   Staphylococcus species DETECTED (A) NOT DETECTED Final    Comment: CRITICAL RESULT CALLED TO, READ BACK BY AND VERIFIED WITHBETHA DAISEY HAAS PHARMD [id number] 09/28/23 HNM    Staphylococcus aureus (BCID) NOT DETECTED NOT DETECTED Final   Staphylococcus epidermidis NOT DETECTED NOT DETECTED Final   Staphylococcus lugdunensis NOT DETECTED NOT DETECTED Final   Streptococcus species NOT DETECTED NOT DETECTED Final   Streptococcus  agalactiae NOT DETECTED NOT DETECTED Final   Streptococcus pneumoniae NOT DETECTED NOT DETECTED Final   Streptococcus pyogenes NOT DETECTED NOT DETECTED Final   A.calcoaceticus-baumannii NOT DETECTED NOT DETECTED Final   Bacteroides fragilis NOT DETECTED NOT DETECTED Final   Enterobacterales NOT DETECTED NOT DETECTED Final   Enterobacter cloacae complex NOT DETECTED NOT DETECTED Final   Escherichia coli NOT DETECTED NOT DETECTED Final   Klebsiella aerogenes NOT DETECTED NOT DETECTED Final   Klebsiella oxytoca NOT DETECTED NOT DETECTED Final   Klebsiella pneumoniae NOT DETECTED NOT DETECTED Final   Proteus species NOT DETECTED NOT DETECTED Final   Salmonella species NOT DETECTED NOT DETECTED Final   Serratia marcescens NOT DETECTED NOT DETECTED Final   Haemophilus influenzae NOT DETECTED NOT DETECTED Final   Neisseria meningitidis NOT DETECTED NOT DETECTED Final   Pseudomonas aeruginosa NOT DETECTED NOT DETECTED Final   Stenotrophomonas maltophilia NOT DETECTED NOT DETECTED Final   Candida albicans NOT DETECTED NOT DETECTED Final   Candida auris NOT DETECTED NOT DETECTED Final   Candida glabrata NOT DETECTED NOT DETECTED Final   Candida krusei NOT DETECTED NOT DETECTED Final   Candida parapsilosis NOT DETECTED NOT DETECTED Final   Candida tropicalis NOT DETECTED NOT DETECTED Final   Cryptococcus neoformans/gattii NOT DETECTED NOT DETECTED Final    Comment: Performed at [facility], [address] [place], [place] [address]  Gastrointestinal Panel by PCR , Stool     Status: None   Collection Time: 09/28/23  3:30 AM   Specimen: Stool  Result Value Ref Range Status   Campylobacter species NOT DETECTED NOT DETECTED Final   Plesimonas shigelloides NOT DETECTED NOT DETECTED Final   Salmonella species NOT DETECTED NOT DETECTED Final   Yersinia enterocolitica NOT DETECTED NOT DETECTED Final   Vibrio species NOT DETECTED NOT DETECTED  Final   Vibrio cholerae NOT DETECTED NOT  DETECTED Final   Enteroaggregative E coli (EAEC) NOT DETECTED NOT DETECTED Final   Enteropathogenic E coli (EPEC) NOT DETECTED NOT DETECTED Final   Enterotoxigenic E coli (ETEC) NOT DETECTED NOT DETECTED Final   Shiga like toxin producing E coli (STEC) NOT DETECTED NOT DETECTED Final   Shigella/Enteroinvasive E coli (EIEC) NOT DETECTED NOT DETECTED Final   Cryptosporidium NOT DETECTED NOT DETECTED Final   Cyclospora cayetanensis NOT DETECTED NOT DETECTED Final   Entamoeba histolytica NOT DETECTED NOT DETECTED Final   Giardia lamblia NOT DETECTED NOT DETECTED Final   Adenovirus F40/41 NOT DETECTED NOT DETECTED Final   Astrovirus NOT DETECTED NOT DETECTED Final   Norovirus GI/GII NOT DETECTED NOT DETECTED Final   Rotavirus A NOT DETECTED NOT DETECTED Final   Sapovirus (I, II, IV, and V) NOT DETECTED NOT DETECTED Final    Comment: Performed at [facility], [address] [place], [place] [address]  C Difficile Quick Screen w PCR reflex     Status: None   Collection Time: 09/28/23  3:30 AM   Specimen: STOOL  Result Value Ref Range Status   C Diff antigen NEGATIVE NEGATIVE Final   C Diff toxin NEGATIVE NEGATIVE Final   C Diff interpretation No C. difficile detected.  Final    Comment: Performed at [facility], [address] [place], [place] [address]    Procedures and diagnostic studies:  CT Head Wo Contrast Result Date: 09/27/2023 CLINICAL DATA:  Fall from bedside commode, head injury EXAM: CT HEAD WITHOUT CONTRAST TECHNIQUE: Contiguous axial images were obtained from the base of the skull through the vertex without intravenous contrast. RADIATION DOSE REDUCTION: This exam was performed according to the departmental dose-optimization program which includes automated exposure control, adjustment of the mA and/or kV according to patient size and/or use of iterative reconstruction technique. COMPARISON:  Prior MRI head 04/30/2023; CT scan of the head 04/30/2023  FINDINGS: Brain: No evidence of acute infarction, hemorrhage, hydrocephalus, extra-axial collection or mass lesion/mass effect. Periventricular white matter hypoattenuation is stable compared to prior and remains most consistent with chronic microvascular ischemic white matter disease. Mild ventriculomegaly likely due to central atrophy. Vascular: No hyperdense vessel or unexpected calcification. Skull: Normal. Negative for fracture or focal lesion. Sinuses/Orbits: No acute finding. Other: None. IMPRESSION: 1. No acute intracranial abnormality. 2. Mild ventriculomegaly is stable compared to prior and likely reflects central atrophy. 3. Chronic microvascular ischemic white matter disease. Electronically Signed   By: Wilkie Lent M.D.   On: 09/27/2023 14:46   DG Chest Port 1 View Result Date: 09/27/2023 CLINICAL DATA:  Questionable sepsis-evaluate for abnormality. EXAM: PORTABLE CHEST 1 VIEW COMPARISON:  Chest CT 05/18/2023. Radiographs 04/30/2023 and 02/04/2019. FINDINGS: 1316 hours. The heart size and mediastinal contours are normal. The lungs are clear. There is no pleural effusion or pneumothorax. No acute osseous findings are identified. There are old left clavicle and rib fractures. IMPRESSION: No evidence of active cardiopulmonary process. Old left clavicle and rib fractures. Electronically Signed   By: Elsie Perone M.D.   On: 09/27/2023 13:54               LOS: 0 days   Yamato Kopf  [facility]   Pager on [email or web address]. If 7PM-7AM, please contact night-coverage at [email or web address]     09/28/2023, 4:55 PM

## 2023-09-28 NOTE — Plan of Care (Signed)

## 2023-09-29 LAB — CBC
HCT: 25.1 % — ABNORMAL LOW (ref 36.0–46.0)
Hemoglobin: 8.6 g/dL — ABNORMAL LOW (ref 12.0–15.0)
MCH: 29.1 pg (ref 26.0–34.0)
MCHC: 34.3 g/dL (ref 30.0–36.0)
MCV: 84.8 fL (ref 80.0–100.0)
Platelets: 308 K/uL (ref 150–400)
RBC: 2.96 MIL/uL — ABNORMAL LOW (ref 3.87–5.11)
RDW: 15.2 % (ref 11.5–15.5)
WBC: 12.5 K/uL — ABNORMAL HIGH (ref 4.0–10.5)
nRBC: 0.2 % (ref 0.0–0.2)

## 2023-09-29 LAB — RENAL FUNCTION PANEL
Albumin: 2.3 g/dL — ABNORMAL LOW (ref 3.5–5.0)
Anion gap: 12 (ref 5–15)
BUN: 6 mg/dL — ABNORMAL LOW (ref 8–23)
CO2: 23 mmol/L (ref 22–32)
Calcium: 7.8 mg/dL — ABNORMAL LOW (ref 8.9–10.3)
Chloride: 106 mmol/L (ref 98–111)
Creatinine, Ser: 0.48 mg/dL (ref 0.44–1.00)
GFR, Estimated: >60 mL/min (ref >60)
Glucose, Bld: 127 mg/dL — ABNORMAL HIGH (ref 70–99)
Phosphorus: 2.3 mg/dL — ABNORMAL LOW (ref 2.5–4.6)
Potassium: 2.9 mmol/L — ABNORMAL LOW (ref 3.5–5.1)
Sodium: 141 mmol/L (ref 135–145)

## 2023-09-29 LAB — CULTURE, BLOOD (ROUTINE X 2)

## 2023-09-29 LAB — MAGNESIUM: Magnesium: 1.3 mg/dL — ABNORMAL LOW (ref 1.7–2.4)

## 2023-09-29 LAB — LACTIC ACID, PLASMA: Lactic Acid, Venous: 2.8 mmol/L (ref 0.5–1.9)

## 2023-09-29 MED ORDER — POTASSIUM CHLORIDE 20 MEQ PO PACK
40.0000 meq | PACK | ORAL | Status: AC
  Administered 2023-09-29 (×2): 40 meq via ORAL
  Filled 2023-09-29 (×2): qty 2

## 2023-09-29 MED ORDER — MAGNESIUM SULFATE 4 GM/100ML IV SOLN
4.0000 g | Freq: Once | INTRAVENOUS | Status: AC
  Administered 2023-09-29: 4 g via INTRAVENOUS
  Filled 2023-09-29: qty 100

## 2023-09-29 MED ORDER — K PHOS MONO-SOD PHOS DI & MONO 155-852-130 MG PO TABS
500.0000 mg | ORAL_TABLET | Freq: Three times a day (TID) | ORAL | Status: AC
  Administered 2023-09-29 (×2): 500 mg via ORAL
  Filled 2023-09-29 (×2): qty 2

## 2023-09-29 NOTE — Plan of Care (Signed)

## 2023-09-29 NOTE — TOC CM/SW Note (Signed)
 Transition of Care [facility]) - Inpatient Brief Assessment   Patient Details  Name: Sheila [name] MRN: [id number] Date of Birth: April 28, 1960  [facility] CM/SW Contact:    Alfonso Rummer, LCSW Phone Number: 09/29/2023, 1:29 PM   Clinical Narrative: LCSW A Figueora completed TOC chart review. No TOC needs identified. Please contact TOC should needs arise.    Transition of Care Asessment:

## 2023-09-29 NOTE — Plan of Care (Signed)

## 2023-09-29 NOTE — Progress Notes (Signed)
 Progress Note    Sheila [name]  FMW:[id number] DOB: 1960-07-22  DOA: 09/27/2023 PCP: Ricard Tawni KIDD, MD      Brief Narrative:    Medical records reviewed and are as summarized below:  Sheila [name] is a 63 y.o. female  with medical history significant of ulcerative colitis on mesalamine  daily, HTN, gout, peripheral neuropathy, GERD, presented with persistent nausea, abdominal pain and diarrhea.  Symptoms started about a week prior to admission.  She has had multiple watery stools today with abdominal cramping.  She was found to have significant abnormal electrolytes, hypotension.    Assessment/Plan:   Principal Problem:   Hypomagnesemia Active Problems:   Hypokalemia   Generalized weakness    Body mass index is 28.32 kg/m.    Abdominal pain and diarrhea, probably due to gastroenteritis: Improved.  Discontinue IV fluids. Use Imodium  as needed for diarrhea.  Analgesics as needed for pain. Stool for C. difficile toxin was negative.  Stool for GI panel was negative. 1 out of 4 blood culture bottles positive for Staphylococcus species but this is likely a contaminant.  Negative for Staph aureus and Staph epidermidis.   Leukocytosis: Improving.   Hypotension: Improved Lactic acidosis:Improving, from 3.1-2 .6-2.8.SABRA   Hypokalemia: Worsening hypokalemia with potassium down from 3.2-2.9.  Continue potassium repletion. Hypomagnesemia: Magnesium  stated 1.3.  Replete with IV magnesium  sulfate.   Hypophosphatemia: Improved.  Continue repletion with potassium phosphate .   General weakness: Consult PT.  Patient concerned about going home today because of general weakness and difficulty ambulating.  He lives alone at home and prefers to stay overnight.   History of asthma and COPD: Stable History of ulcerative colitis: Stable.  Continue mesalamine     Diet Order             Diet Heart Room service appropriate? Yes; Fluid consistency: Thin  Diet effective  now                                  Consultants: None  Procedures: None    Medications:    acetaminophen   500 mg Oral Q12H   allopurinol   100 mg Oral Daily   brinzolamide   1 drop Both Eyes BID   enoxaparin  (LOVENOX ) injection  40 mg Subcutaneous Q24H   gabapentin   300 mg Oral BID   influenza vac split trivalent PF  0.5 mL Intramuscular Tomorrow-1000   latanoprost   1 drop Both Eyes QHS   mesalamine   2.4 g Oral Q breakfast   pantoprazole   40 mg Oral BH-q7a   phosphorus  500 mg Oral TID   pravastatin   10 mg Oral BH-q7a   sucralfate   1 g Oral BID   Continuous Infusions:     Anti-infectives (From admission, onward)    None              Family Communication/Anticipated D/C date and plan/Code Status   DVT prophylaxis: enoxaparin  (LOVENOX ) injection 40 mg Start: 09/27/23 1545     Code Status: Full Code  Family Communication: None Disposition Plan: Plan to discharge home   Status is: Observation The patient will require care spanning > 2 midnights and should be moved to inpatient because: Diarrhea with significant electrolyte abnormalities       Subjective:   Interval events noted.  She complains of general weakness and difficulty ambulating.  She lives alone and she is concerned about going home today.  Diarrhea  has improved.  No vomiting or abdominal pain.  Objective:    Vitals:   09/28/23 2004 09/28/23 2007 09/29/23 0424 09/29/23 0816  BP: (!) 104/32 (!) 105/41 (!) 116/95 121/75  Pulse: (!) 101 100 (!) 101 (!) 101  Resp: 17 16 16 16   Temp: (!) 97.5 F (36.4 C) (!) 97.5 F (36.4 C) 97.7 F (36.5 C) 97.9 F (36.6 C)  TempSrc: Oral  Oral   SpO2: 100% 100% 100% 100%  Weight:      Height:       No data found.   Intake/Output Summary (Last 24 hours) at 09/29/2023 1444 Last data filed at 09/29/2023 0945 Gross per 24 hour  Intake 200 ml  Output --  Net 200 ml   Filed Weights   09/27/23 1236  Weight: 65.8 kg     Exam:  GEN: NAD SKIN: Warm and dry EYES: No pallor or icterus ENT: MMM CV: RRR PULM: CTA B ABD: soft, ND, NT, +BS CNS: AAO x 3, non focal EXT: No edema or tenderness       Data Reviewed:   I have personally reviewed following labs and imaging studies:  Labs: Labs show the following:   Basic Metabolic Panel: Recent Labs  Lab 09/27/23 1300 09/27/23 2103 09/28/23 0622 09/29/23 0648  NA 141  --  139 141  K 2.9*  --  3.2* 2.9*  CL 109  --  110 106  CO2 19*  --  19* 23  GLUCOSE 133*  --  114* 127*  BUN 22  --  13 6*  CREATININE 0.96  --  0.49 0.48  CALCIUM  8.9  --  7.3* 7.8*  MG 0.6* 1.9 1.3* 1.3*  PHOS 1.2*  --  4.7* 2.3*   GFR Estimated Creatinine Clearance: 61.7 mL/min (by C-G formula based on SCr of 0.48 mg/dL). Liver Function Tests: Recent Labs  Lab 09/27/23 1300 09/29/23 0648  AST 45*  --   ALT 18  --   ALKPHOS 114  --   BILITOT 1.3*  --   PROT 6.5  --   ALBUMIN  3.0* 2.3*   No results for input(s): LIPASE, AMYLASE in the last 168 hours. No results for input(s): AMMONIA in the last 168 hours. Coagulation profile Recent Labs  Lab 09/27/23 1300  INR 1.0    CBC: Recent Labs  Lab 09/27/23 1300 09/28/23 0622 09/29/23 0648  WBC 18.5* 12.3* 12.5*  NEUTROABS 12.6*  --   --   HGB 10.6* 8.2* 8.6*  HCT 32.3* 24.3* 25.1*  MCV 88.0 85.9 84.8  PLT 415* 275 308   Cardiac Enzymes: No results for input(s): CKTOTAL, CKMB, CKMBINDEX, TROPONINI in the last 168 hours. BNP (last 3 results) No results for input(s): PROBNP in the last 8760 hours. CBG: No results for input(s): GLUCAP in the last 168 hours. D-Dimer: No results for input(s): DDIMER in the last 72 hours. Hgb A1c: No results for input(s): HGBA1C in the last 72 hours. Lipid Profile: No results for input(s): CHOL, HDL, LDLCALC, TRIG, CHOLHDL, LDLDIRECT in the last 72 hours. Thyroid  function studies: No results for input(s): TSH, T4TOTAL, T3FREE,  THYROIDAB in the last 72 hours.  Invalid input(s): FREET3 Anemia work up: No results for input(s): VITAMINB12, FOLATE, FERRITIN, TIBC, IRON, RETICCTPCT in the last 72 hours. Sepsis Labs: Recent Labs  Lab 09/27/23 1249 09/27/23 1300 09/27/23 1447 09/28/23 0622 09/29/23 0648  WBC  --  18.5*  --  12.3* 12.5*  LATICACIDVEN 3.1*  --  2.6*  --  2.8*    Microbiology Recent Results (from the past 240 hours)  Blood Culture (routine x 2)     Status: None (Preliminary result)   Collection Time: 09/27/23  1:00 PM   Specimen: BLOOD  Result Value Ref Range Status   Specimen Description BLOOD RIGHT ANTECUBITAL  Final   Special Requests   Final    BOTTLES DRAWN AEROBIC AND ANAEROBIC Blood Culture adequate volume   Culture   Final    NO GROWTH 2 DAYS Performed at [facility], [address] [place], [place] [address]    Report Status PENDING  Incomplete  Blood Culture (routine x 2)     Status: Abnormal   Collection Time: 09/27/23  1:00 PM   Specimen: BLOOD  Result Value Ref Range Status   Specimen Description   Final    BLOOD LEFT ANTECUBITAL Performed at [facility], [address], [place], [place] [address]    Special Requests   Final    BOTTLES DRAWN AEROBIC AND ANAEROBIC Blood Culture results may not be optimal due to an inadequate volume of blood received in culture bottles Performed at [facility], [address], [place], [place] [address]    Culture  Setup Time   Final    GRAM POSITIVE COCCI AEROBIC BOTTLE ONLY CRITICAL RESULT CALLED TO, READ BACK BY AND VERIFIED WITH: KRISTIN MERRILL PHARMD [id number] 09/28/23 HNM GRAM STAIN REVIEWED-AGREE WITH RESULT DRT    Culture (A)  Final    STAPHYLOCOCCUS XYLOSUS THE SIGNIFICANCE OF ISOLATING THIS ORGANISM FROM A SINGLE SET OF BLOOD CULTURES WHEN MULTIPLE SETS ARE DRAWN IS UNCERTAIN. PLEASE NOTIFY THE MICROBIOLOGY DEPARTMENT WITHIN ONE WEEK IF SPECIATION AND SENSITIVITIES ARE  REQUIRED. Performed at [facility], [address], [place], [place] [address]    Report Status 09/29/2023 FINAL  Final  Blood Culture ID Panel (Reflexed)     Status: Abnormal   Collection Time: 09/27/23  1:00 PM  Result Value Ref Range Status   Enterococcus faecalis NOT DETECTED NOT DETECTED Final   Enterococcus Faecium NOT DETECTED NOT DETECTED Final   Listeria monocytogenes NOT DETECTED NOT DETECTED Final   Staphylococcus species DETECTED (A) NOT DETECTED Final    Comment: CRITICAL RESULT CALLED TO, READ BACK BY AND VERIFIED WITHBETHA DAISEY HAAS PHARMD [id number] 09/28/23 HNM    Staphylococcus aureus (BCID) NOT DETECTED NOT DETECTED Final   Staphylococcus epidermidis NOT DETECTED NOT DETECTED Final   Staphylococcus lugdunensis NOT DETECTED NOT DETECTED Final   Streptococcus species NOT DETECTED NOT DETECTED Final   Streptococcus agalactiae NOT DETECTED NOT DETECTED Final   Streptococcus pneumoniae NOT DETECTED NOT DETECTED Final   Streptococcus pyogenes NOT DETECTED NOT DETECTED Final   A.calcoaceticus-baumannii NOT DETECTED NOT DETECTED Final   Bacteroides fragilis NOT DETECTED NOT DETECTED Final   Enterobacterales NOT DETECTED NOT DETECTED Final   Enterobacter cloacae complex NOT DETECTED NOT DETECTED Final   Escherichia coli NOT DETECTED NOT DETECTED Final   Klebsiella aerogenes NOT DETECTED NOT DETECTED Final   Klebsiella oxytoca NOT DETECTED NOT DETECTED Final   Klebsiella pneumoniae NOT DETECTED NOT DETECTED Final   Proteus species NOT DETECTED NOT DETECTED Final   Salmonella species NOT DETECTED NOT DETECTED Final   Serratia marcescens NOT DETECTED NOT DETECTED Final   Haemophilus influenzae NOT DETECTED NOT DETECTED Final   Neisseria meningitidis NOT DETECTED NOT DETECTED Final   Pseudomonas aeruginosa NOT DETECTED NOT DETECTED Final   Stenotrophomonas maltophilia NOT DETECTED NOT DETECTED Final   Candida  albicans NOT DETECTED NOT DETECTED Final   Candida auris NOT  DETECTED NOT DETECTED Final   Candida glabrata NOT DETECTED NOT DETECTED Final   Candida krusei NOT DETECTED NOT DETECTED Final   Candida parapsilosis NOT DETECTED NOT DETECTED Final   Candida tropicalis NOT DETECTED NOT DETECTED Final   Cryptococcus neoformans/gattii NOT DETECTED NOT DETECTED Final    Comment: Performed at [facility], [address] [place], [place] [address]  Gastrointestinal Panel by PCR , Stool     Status: None   Collection Time: 09/28/23  3:30 AM   Specimen: Stool  Result Value Ref Range Status   Campylobacter species NOT DETECTED NOT DETECTED Final   Plesimonas shigelloides NOT DETECTED NOT DETECTED Final   Salmonella species NOT DETECTED NOT DETECTED Final   Yersinia enterocolitica NOT DETECTED NOT DETECTED Final   Vibrio species NOT DETECTED NOT DETECTED Final   Vibrio cholerae NOT DETECTED NOT DETECTED Final   Enteroaggregative E coli (EAEC) NOT DETECTED NOT DETECTED Final   Enteropathogenic E coli (EPEC) NOT DETECTED NOT DETECTED Final   Enterotoxigenic E coli (ETEC) NOT DETECTED NOT DETECTED Final   Shiga like toxin producing E coli (STEC) NOT DETECTED NOT DETECTED Final   Shigella/Enteroinvasive E coli (EIEC) NOT DETECTED NOT DETECTED Final   Cryptosporidium NOT DETECTED NOT DETECTED Final   Cyclospora cayetanensis NOT DETECTED NOT DETECTED Final   Entamoeba histolytica NOT DETECTED NOT DETECTED Final   Giardia lamblia NOT DETECTED NOT DETECTED Final   Adenovirus F40/41 NOT DETECTED NOT DETECTED Final   Astrovirus NOT DETECTED NOT DETECTED Final   Norovirus GI/GII NOT DETECTED NOT DETECTED Final   Rotavirus A NOT DETECTED NOT DETECTED Final   Sapovirus (I, II, IV, and V) NOT DETECTED NOT DETECTED Final    Comment: Performed at [facility], [address] [place], [place] [address]  C Difficile Quick Screen w PCR reflex     Status: None   Collection Time: 09/28/23  3:30 AM   Specimen: STOOL  Result Value Ref Range Status   C  Diff antigen NEGATIVE NEGATIVE Final   C Diff toxin NEGATIVE NEGATIVE Final   C Diff interpretation No C. difficile detected.  Final    Comment: Performed at [facility], [address] [place], [place] [address]    Procedures and diagnostic studies:  No results found.              LOS: 0 days   Amauri Medellin  [facility]   Pager on [email or web address]. If 7PM-7AM, please contact night-coverage at [email or web address]     09/29/2023, 2:44 PM

## 2023-09-29 NOTE — Evaluation (Signed)
 Physical Therapy Evaluation Patient Details Name: Sheila [name] MRN: [id number] DOB: Sep 16, 1960 Today's Date: 09/29/2023  History of Present Illness  LAJOY VANAMBURG is a 63 y.o. female  with medical history significant of ulcerative colitis on mesalamine  daily, HTN, gout, peripheral neuropathy, GERD, presented with persistent nausea, abdominal pain and diarrhea.  Symptoms started about a week prior to admission.  She has had multiple watery stools today with abdominal cramping.     She was found to have significant abnormal electrolytes, hypotension.   Clinical Impression  Patient received in bed on phone. [name] agreeable to PT assessment. Patient requires cga for supine to sit. Generalized weakness. [name] able to stand with cga, ambulated 25 feet in room with RW. She has coordination and proprioceptive deficits of B LEs with ambulation. Also had knee buckling x1 while ambulating. [name] at increased fall risk and normally walks with [facility]. She definitely needs walker for improved safety. She states she needs a smaller one to work well in her home. Patient will continue to benefit from skilled PT to improve independence and safety with mobility.          If plan is discharge home, recommend the following: A little help with walking and/or transfers;A little help with bathing/dressing/bathroom   Can travel by private vehicle    yes    Equipment Recommendations Other (comment) (needs a smaller RW ( width))  Recommendations for Other Services       Functional Status Assessment Patient has had a recent decline in their functional status and demonstrates the ability to make significant improvements in function in a reasonable and predictable amount of time.     Precautions / Restrictions Precautions Precautions: Fall Recall of Precautions/Restrictions: Intact Restrictions Weight Bearing Restrictions Per Provider Order: No      Mobility  Bed Mobility Overal bed mobility: Needs  Assistance Bed Mobility: Supine to Sit, Sit to Supine     Supine to sit: Min assist, HOB elevated, Used rails Sit to supine: Supervision   General bed mobility comments: Generalized weakness globally    Transfers Overall transfer level: Needs assistance Equipment used: Rolling walker (2 wheels) Transfers: Sit to/from Stand Sit to Stand: Contact guard assist                Ambulation/Gait Ambulation/Gait assistance: Contact guard assist Gait Distance (Feet): 25 Feet Assistive device: Rolling walker (2 wheels) Gait Pattern/deviations: Step-to pattern, Decreased step length - right, Decreased step length - left, Decreased dorsiflexion - right, Decreased dorsiflexion - left Gait velocity: decr     General Gait Details: decreased proprioception & coordination of LEs noted with ambulation. General weakness of UEs and LEs. Had one instance of LE buckling with ambulation  Stairs            Wheelchair Mobility     Tilt Bed    Modified Rankin (Stroke Patients Only)       Balance Overall balance assessment: Needs assistance, History of Falls Sitting-balance support: Feet supported Sitting balance-Leahy Scale: Good     Standing balance support: Bilateral upper extremity supported, During functional activity, Reliant on assistive device for balance Standing balance-Leahy Scale: Fair                               Pertinent Vitals/Pain Pain Assessment Pain Assessment: No/denies pain    Home Living Family/patient expects to be discharged to:: Private residence Living Arrangements: Alone  Type of Home: House Home Access: Ramped entrance       Home Layout: One level Home Equipment: Agricultural consultant (2 wheels);Cane - single point;Crutches;BSC/3in1;Shower seat;Wheelchair - manual      Prior Function Prior Level of Function : History of Falls (last six months)             Mobility Comments: SPC for mobility, endorses 20+ falls in past 62mo due  to her knees giving out/buckling ADLs Comments: Pt endorses indep with ADL and light IADL. Schedules transportation and daughter has groceries delivered.     Extremity/Trunk Assessment   Upper Extremity Assessment Upper Extremity Assessment: Generalized weakness    Lower Extremity Assessment Lower Extremity Assessment: Generalized weakness;RLE deficits/detail;LLE deficits/detail RLE Sensation: decreased proprioception;history of peripheral neuropathy RLE Coordination: decreased gross motor LLE Sensation: decreased proprioception;history of peripheral neuropathy LLE Coordination: decreased gross motor    Cervical / Trunk Assessment Cervical / Trunk Assessment: Normal  Communication   Communication Communication: No apparent difficulties    Cognition Arousal: Alert Behavior During Therapy: WFL for tasks assessed/performed   PT - Cognitive impairments: No apparent impairments                         Following commands: Intact       Cueing Cueing Techniques: Verbal cues     General Comments      Exercises     Assessment/Plan    PT Assessment Patient needs continued PT services  PT Problem List Decreased strength;Decreased activity tolerance;Decreased balance;Decreased mobility;Decreased safety awareness;Impaired sensation       PT Treatment Interventions DME instruction;Gait training;Functional mobility training;Therapeutic activities;Therapeutic exercise;Balance training;Neuromuscular re-education;Patient/family education    PT Goals (Current goals can be found in the Care Plan section)  Acute Rehab PT Goals Patient Stated Goal: go home with daughter at discharge PT Goal Formulation: With patient Time For Goal Achievement: 10/13/23 Potential to Achieve Goals: Good    Frequency Min 2X/week     Co-evaluation               AM-PAC PT 6 Clicks Mobility  Outcome Measure Help needed turning from your back to your side while in a flat bed  without using bedrails?: A Little Help needed moving from lying on your back to sitting on the side of a flat bed without using bedrails?: A Little Help needed moving to and from a bed to a chair (including a wheelchair)?: A Little Help needed standing up from a chair using your arms (e.g., wheelchair or bedside chair)?: A Little Help needed to walk in hospital room?: A Little Help needed climbing 3-5 steps with a railing? : A Lot 6 Click Score: 17    End of Session   Activity Tolerance: Patient limited by fatigue Patient left: in bed;with call bell/phone within reach;with bed alarm set Nurse Communication: Mobility status PT Visit Diagnosis: Other abnormalities of gait and mobility (R26.89);Muscle weakness (generalized) (M62.81);Difficulty in walking, not elsewhere classified (R26.2);Repeated falls (R29.6)    Time: 8671-8659 PT Time Calculation (min) (ACUTE ONLY): 12 min   Charges:   PT Evaluation $PT Eval Moderate Complexity: 1 Mod   PT General Charges $$ ACUTE PT VISIT: 1 Visit         Maxey Ransom, PT, GCS 09/29/23,1:51 PM

## 2023-09-30 LAB — BASIC METABOLIC PANEL WITH GFR
Anion gap: 11 (ref 5–15)
BUN: <5 mg/dL — ABNORMAL LOW (ref 8–23)
CO2: 26 mmol/L (ref 22–32)
Calcium: 7.9 mg/dL — ABNORMAL LOW (ref 8.9–10.3)
Chloride: 104 mmol/L (ref 98–111)
Creatinine, Ser: 0.36 mg/dL — ABNORMAL LOW (ref 0.44–1.00)
GFR, Estimated: >60 mL/min (ref >60)
Glucose, Bld: 140 mg/dL — ABNORMAL HIGH (ref 70–99)
Potassium: 3.3 mmol/L — ABNORMAL LOW (ref 3.5–5.1)
Sodium: 141 mmol/L (ref 135–145)

## 2023-09-30 LAB — CBC
HCT: 25.2 % — ABNORMAL LOW (ref 36.0–46.0)
Hemoglobin: 8.7 g/dL — ABNORMAL LOW (ref 12.0–15.0)
MCH: 29.1 pg (ref 26.0–34.0)
MCHC: 34.5 g/dL (ref 30.0–36.0)
MCV: 84.3 fL (ref 80.0–100.0)
Platelets: 292 K/uL (ref 150–400)
RBC: 2.99 MIL/uL — ABNORMAL LOW (ref 3.87–5.11)
RDW: 15.2 % (ref 11.5–15.5)
WBC: 15 K/uL — ABNORMAL HIGH (ref 4.0–10.5)
nRBC: 0.1 % (ref 0.0–0.2)

## 2023-09-30 LAB — POTASSIUM: Potassium: 3.9 mmol/L (ref 3.5–5.1)

## 2023-09-30 LAB — MAGNESIUM
Magnesium: 1.4 mg/dL — ABNORMAL LOW (ref 1.7–2.4)
Magnesium: 2.8 mg/dL — ABNORMAL HIGH (ref 1.7–2.4)

## 2023-09-30 LAB — LACTIC ACID, PLASMA: Lactic Acid, Venous: 1.4 mmol/L (ref 0.5–1.9)

## 2023-09-30 LAB — PHOSPHORUS: Phosphorus: 3.5 mg/dL (ref 2.5–4.6)

## 2023-09-30 MED ORDER — MAGNESIUM SULFATE 4 GM/100ML IV SOLN
4.0000 g | Freq: Once | INTRAVENOUS | Status: AC
Start: 2023-09-30 — End: 2023-09-30
  Administered 2023-09-30: 4 g via INTRAVENOUS
  Filled 2023-09-30: qty 100

## 2023-09-30 MED ORDER — POTASSIUM CHLORIDE 20 MEQ PO PACK
40.0000 meq | PACK | ORAL | Status: AC
  Administered 2023-09-30 (×2): 40 meq via ORAL
  Filled 2023-09-30 (×2): qty 2

## 2023-09-30 MED ORDER — GABAPENTIN 300 MG PO CAPS: 300.0000 mg | ORAL_CAPSULE | Freq: Two times a day (BID) | ORAL | Status: AC

## 2023-09-30 NOTE — Plan of Care (Signed)

## 2023-09-30 NOTE — Discharge Summary (Signed)
 Physician Discharge Summary   Patient: Sheila [name] MRN: [id number] DOB: [birth year]  Admit date:     09/27/2023  Discharge date: 09/30/23  Discharge Physician: AIDA CHO   PCP: Ricard Tawni KIDD, MD   Recommendations at discharge:   Follow-up with PCP in 1 week  Discharge Diagnoses: Principal Problem:   Hypomagnesemia Active Problems:   Hypokalemia   Generalized weakness  Resolved Problems:   * No resolved hospital problems. *  Hospital Course:  Sheila [name] is a 63 y.o. 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  Symptoms started about a week prior to admission.  She has had multiple watery stools associated with abdominal cramping.   She was found to have significant abnormal electrolytes, hypotension.    Assessment and Plan:  Abdominal pain and diarrhea, probably due to gastroenteritis: Improved.   Stool for C. difficile toxin was negative.  Stool for GI panel was negative. 1 out of 4 blood culture bottles positive for Staphylococcus xylosus but this is likely a contaminant.  Negative for Staph aureus and Staph epidermidis.     Leukocytosis: Chart review shows that patient has chronic leukocytosis.       Hypotension: Improved Lactic acidosis: Resolved, from 3.1-2 .6-2.8.-1.4.     Hypokalemia: Improved after repletion.  Patient takes potassium supplement at home for chronic hypokalemia.  Hypomagnesemia: Improved after repletion with IV magnesium  sulfate. Magnesium  up to 2.8 from overcorrection of hypomagnesemia.  I expect this to return back to baseline with time. Hypophosphatemia: Improved.     Peripheral neuropathy, General weakness: Recommended home health therapy and a rolling walker which has been ordered.     History of  asthma and COPD: Stable History of ulcerative colitis: Stable.  Continue mesalamine      History of alcohol use disorder: She said she has been used alcohol in about 2 to 3 weeks.  Counseled to continue abstinence from alcohol.   Her condition has improved and she is deemed stable for discharge home today.      Consultants: None Procedures performed: None Disposition: Home health Diet recommendation:  Discharge Diet Orders (From admission, onward)     Start     Ordered   09/30/23 0000  Diet - low sodium heart healthy        09/30/23 1507           Cardiac diet DISCHARGE MEDICATION: Allergies as of 09/30/2023       Reactions   Ampicillin Anaphylaxis   Erythromycin Anaphylaxis   Omeprazole Anaphylaxis   Penicillins Anaphylaxis, Other (See Comments)   Has patient had a PCN reaction causing immediate rash, facial/tongue/throat swelling, SOB or lightheadedness with hypotension: Yes Has patient had a PCN reaction causing severe rash involving mucus membranes or skin necrosis: No Has patient had a PCN reaction that required hospitalization: Unknown Has patient had a PCN reaction occurring within the last 10 years: Yes If all of the above answers are NO, then may proceed with Cephalosporin use.   Triamterene-hctz Shortness Of Breath, Swelling   Quinapril Cough        Medication List     STOP taking these medications    albuterol  108 (90 Base) MCG/ACT inhaler Commonly known as: VENTOLIN  HFA   brinzolamide  1 % ophthalmic suspension Commonly known as: AZOPT    multivitamin with minerals Tabs tablet  TAKE these medications    acetaminophen  500 MG tablet Commonly known as: TYLENOL  Take 1 tablet (500 mg total) by mouth every 12 (twelve) hours.   allopurinol  100 MG tablet Commonly known as: ZYLOPRIM  Take 100 mg by mouth daily.   ascorbic acid  500 MG tablet Commonly known as: VITAMIN C Take 1 tablet (500 mg total) by mouth daily.   chlorthalidone  25 MG  tablet Commonly known as: HYGROTON  Take 25 mg by mouth daily.   [employer] VITAMIN B12 1000 MCG tablet Generic drug: cyanocobalamin  Take 1,000 mcg by mouth daily.   folic acid  1 MG tablet Commonly known as: FOLVITE  Take 1 tablet (1 mg total) by mouth daily.   gabapentin  300 MG capsule Commonly known as: NEURONTIN  Take 1 capsule (300 mg total) by mouth 2 (two) times daily.   Klor-Con  M20 20 MEQ tablet Generic drug: potassium chloride  SA Take 2 tablets (40 mEq total) by mouth daily.   latanoprost  0.005 % ophthalmic solution Commonly known as: XALATAN  Place 1 drop into both eyes at bedtime.   losartan  25 MG tablet Commonly known as: COZAAR  Take 25 mg by mouth daily.   mesalamine  1.2 g EC tablet Commonly known as: LIALDA  Take 2.4 g by mouth daily with breakfast.   metFORMIN  500 MG 24 hr tablet Commonly known as: GLUCOPHAGE -XR Take 500 mg by mouth 2 (two) times daily.   metoprolol  succinate 50 MG 24 hr tablet Commonly known as: TOPROL -XL Take 50 mg by mouth at bedtime.   pantoprazole  40 MG tablet Commonly known as: PROTONIX  Take 40 mg by mouth every morning.   pravastatin  10 MG tablet Commonly known as: PRAVACHOL  Take 10 mg by mouth every morning.   sucralfate  1 g tablet Commonly known as: CARAFATE  Take 1 g by mouth 2 (two) times daily.   thiamine  100 MG tablet Commonly known as: VITAMIN B1 Take 1 tablet (100 mg total) by mouth daily.   Vitamin D -1000 Max St 25 MCG (1000 UT) tablet Generic drug: Cholecalciferol Take 1,000 Units by mouth daily.               Durable Medical Equipment  (From admission, onward)           Start     Ordered   09/30/23 0000  For home use only DME Walker rolling       Question Answer Comment  Walker: With 5 Inch Wheels   Patient needs a walker to treat with the following condition General weakness      09/30/23 0809            Discharge Exam: Fredricka Weights   09/27/23 1236  Weight: 65.8 kg   GEN: NAD SKIN:  Warm and dry EYES: No pallor or icterus ENT: MMM CV: RRR PULM: CTA B ABD: soft, ND, NT, +BS CNS: AAO x 3, non focal EXT: No edema or tenderness   Condition at discharge: good  The results of significant diagnostics from this hospitalization (including imaging, microbiology, ancillary and laboratory) are listed below for reference.   Imaging Studies: CT Head Wo Contrast Result Date: 09/27/2023 CLINICAL DATA:  Fall from bedside commode, head injury EXAM: CT HEAD WITHOUT CONTRAST TECHNIQUE: Contiguous axial images were obtained from the base of the skull through the vertex without intravenous contrast. RADIATION DOSE REDUCTION: This exam was performed according to the departmental dose-optimization program which includes automated exposure control, adjustment of the mA and/or kV according to patient size and/or use of iterative reconstruction technique. COMPARISON:  Prior MRI  head 04/30/2023; CT scan of the head 04/30/2023 FINDINGS: Brain: No evidence of acute infarction, hemorrhage, hydrocephalus, extra-axial collection or mass lesion/mass effect. Periventricular white matter hypoattenuation is stable compared to prior and remains most consistent with chronic microvascular ischemic white matter disease. Mild ventriculomegaly likely due to central atrophy. Vascular: No hyperdense vessel or unexpected calcification. Skull: Normal. Negative for fracture or focal lesion. Sinuses/Orbits: No acute finding. Other: None. IMPRESSION: 1. No acute intracranial abnormality. 2. Mild ventriculomegaly is stable compared to prior and likely reflects central atrophy. 3. Chronic microvascular ischemic white matter disease. Electronically Signed   By: Wilkie Lent M.D.   On: 09/27/2023 14:46   DG Chest Port 1 View Result Date: 09/27/2023 CLINICAL DATA:  Questionable sepsis-evaluate for abnormality. EXAM: PORTABLE CHEST 1 VIEW COMPARISON:  Chest CT 05/18/2023. Radiographs 04/30/2023 and 02/04/2019. FINDINGS: 1316  hours. The heart size and mediastinal contours are normal. The lungs are clear. There is no pleural effusion or pneumothorax. No acute osseous findings are identified. There are old left clavicle and rib fractures. IMPRESSION: No evidence of active cardiopulmonary process. Old left clavicle and rib fractures. Electronically Signed   By: Elsie Perone M.D.   On: 09/27/2023 13:54    Microbiology: Results for orders placed or performed during the hospital encounter of 09/27/23  Blood Culture (routine x 2)     Status: None (Preliminary result)   Collection Time: 09/27/23  1:00 PM   Specimen: BLOOD  Result Value Ref Range Status   Specimen Description BLOOD RIGHT ANTECUBITAL  Final   Special Requests   Final    BOTTLES DRAWN AEROBIC AND ANAEROBIC Blood Culture adequate volume   Culture   Final    NO GROWTH 3 DAYS Performed at [facility], [address] [place], [place] [address]    Report Status PENDING  Incomplete  Blood Culture (routine x 2)     Status: Abnormal   Collection Time: 09/27/23  1:00 PM   Specimen: BLOOD  Result Value Ref Range Status   Specimen Description   Final    BLOOD LEFT ANTECUBITAL Performed at [facility], [address] [place], [place] [address]    Special Requests   Final    BOTTLES DRAWN AEROBIC AND ANAEROBIC Blood Culture results may not be optimal due to an inadequate volume of blood received in culture bottles Performed at [facility], [address] [place], [place] [address]    Culture  Setup Time   Final    GRAM POSITIVE COCCI AEROBIC BOTTLE ONLY CRITICAL RESULT CALLED TO, READ BACK BY AND VERIFIED WITH: KRISTIN MERRILL PHARMD [id number] 09/28/23 HNM GRAM STAIN REVIEWED-AGREE WITH RESULT DRT    Culture (A)  Final    STAPHYLOCOCCUS XYLOSUS THE SIGNIFICANCE OF ISOLATING THIS ORGANISM FROM A SINGLE SET OF BLOOD CULTURES WHEN MULTIPLE SETS ARE DRAWN IS UNCERTAIN. PLEASE NOTIFY THE MICROBIOLOGY DEPARTMENT WITHIN ONE WEEK IF  SPECIATION AND SENSITIVITIES ARE REQUIRED. Performed at [facility], [address], [place], [place] [address]    Report Status 09/29/2023 FINAL  Final  Blood Culture ID Panel (Reflexed)     Status: Abnormal   Collection Time: 09/27/23  1:00 PM  Result Value Ref Range Status   Enterococcus faecalis NOT DETECTED NOT DETECTED Final   Enterococcus Faecium NOT DETECTED NOT DETECTED Final   Listeria monocytogenes NOT DETECTED NOT DETECTED Final   Staphylococcus species DETECTED (A) NOT DETECTED Final    Comment: CRITICAL RESULT CALLED TO, READ BACK BY AND  VERIFIED WITHBETHA DAISEY HAAS [facility] 9166 09/28/23 HNM    Staphylococcus aureus (BCID) NOT DETECTED NOT DETECTED Final   Staphylococcus epidermidis NOT DETECTED NOT DETECTED Final   Staphylococcus lugdunensis NOT DETECTED NOT DETECTED Final   Streptococcus species NOT DETECTED NOT DETECTED Final   Streptococcus agalactiae NOT DETECTED NOT DETECTED Final   Streptococcus pneumoniae NOT DETECTED NOT DETECTED Final   Streptococcus pyogenes NOT DETECTED NOT DETECTED Final   A.calcoaceticus-baumannii NOT DETECTED NOT DETECTED Final   Bacteroides fragilis NOT DETECTED NOT DETECTED Final   Enterobacterales NOT DETECTED NOT DETECTED Final   Enterobacter cloacae complex NOT DETECTED NOT DETECTED Final   Escherichia coli NOT DETECTED NOT DETECTED Final   Klebsiella aerogenes NOT DETECTED NOT DETECTED Final   Klebsiella oxytoca NOT DETECTED NOT DETECTED Final   Klebsiella pneumoniae NOT DETECTED NOT DETECTED Final   Proteus species NOT DETECTED NOT DETECTED Final   Salmonella species NOT DETECTED NOT DETECTED Final   Serratia marcescens NOT DETECTED NOT DETECTED Final   Haemophilus influenzae NOT DETECTED NOT DETECTED Final   Neisseria meningitidis NOT DETECTED NOT DETECTED Final   Pseudomonas aeruginosa NOT DETECTED NOT DETECTED Final   Stenotrophomonas maltophilia NOT DETECTED NOT DETECTED Final   Candida albicans NOT DETECTED NOT  DETECTED Final   Candida auris NOT DETECTED NOT DETECTED Final   Candida glabrata NOT DETECTED NOT DETECTED Final   Candida krusei NOT DETECTED NOT DETECTED Final   Candida parapsilosis NOT DETECTED NOT DETECTED Final   Candida tropicalis NOT DETECTED NOT DETECTED Final   Cryptococcus neoformans/gattii NOT DETECTED NOT DETECTED Final    Comment: Performed at [facility], [address] [place], [place] [address]  Gastrointestinal Panel by PCR , Stool     Status: None   Collection Time: 09/28/23  3:30 AM   Specimen: Stool  Result Value Ref Range Status   Campylobacter species NOT DETECTED NOT DETECTED Final   Plesimonas shigelloides NOT DETECTED NOT DETECTED Final   Salmonella species NOT DETECTED NOT DETECTED Final   Yersinia enterocolitica NOT DETECTED NOT DETECTED Final   Vibrio species NOT DETECTED NOT DETECTED Final   Vibrio cholerae NOT DETECTED NOT DETECTED Final   Enteroaggregative E coli (EAEC) NOT DETECTED NOT DETECTED Final   Enteropathogenic E coli (EPEC) NOT DETECTED NOT DETECTED Final   Enterotoxigenic E coli (ETEC) NOT DETECTED NOT DETECTED Final   Shiga like toxin producing E coli (STEC) NOT DETECTED NOT DETECTED Final   Shigella/Enteroinvasive E coli (EIEC) NOT DETECTED NOT DETECTED Final   Cryptosporidium NOT DETECTED NOT DETECTED Final   Cyclospora cayetanensis NOT DETECTED NOT DETECTED Final   Entamoeba histolytica NOT DETECTED NOT DETECTED Final   Giardia lamblia NOT DETECTED NOT DETECTED Final   Adenovirus F40/41 NOT DETECTED NOT DETECTED Final   Astrovirus NOT DETECTED NOT DETECTED Final   Norovirus GI/GII NOT DETECTED NOT DETECTED Final   Rotavirus A NOT DETECTED NOT DETECTED Final   Sapovirus (I, II, IV, and V) NOT DETECTED NOT DETECTED Final    Comment: Performed at [facility], [address] [place], [place] [address]  C Difficile Quick Screen w PCR reflex     Status: None   Collection Time: 09/28/23  3:30 AM   Specimen: STOOL   Result Value Ref Range Status   C Diff antigen NEGATIVE NEGATIVE Final   C Diff toxin NEGATIVE NEGATIVE Final   C Diff interpretation No C. difficile detected.  Final    Comment: Performed at [facility], [address] [place], [place] [address]    Labs: CBC: Recent Labs  Lab 09/27/23 1300 09/28/23 0622 09/29/23 0648 09/30/23 0621  WBC 18.5* 12.3* 12.5* 15.0*  NEUTROABS 12.6*  --   --   --   HGB 10.6* 8.2* 8.6* 8.7*  HCT 32.3* 24.3* 25.1* 25.2*  MCV 88.0 85.9 84.8 84.3  PLT 415* 275 308 292   Basic Metabolic Panel: Recent Labs  Lab 09/27/23 1300 09/27/23 2103 09/28/23 0622 09/29/23 0648 09/30/23 0621 09/30/23 1410  NA 141  --  139 141 141  --   K 2.9*  --  3.2* 2.9* 3.3* 3.9  CL 109  --  110 106 104  --   CO2 19*  --  19* 23 26  --   GLUCOSE 133*  --  114* 127* 140*  --   BUN 22  --  13 6* <5*  --   CREATININE 0.96  --  0.49 0.48 0.36*  --   CALCIUM  8.9  --  7.3* 7.8* 7.9*  --   MG 0.6* 1.9 1.3* 1.3* 1.4* 2.8*  PHOS 1.2*  --  4.7* 2.3* 3.5  --    Liver Function Tests: Recent Labs  Lab 09/27/23 1300 09/29/23 0648  AST 45*  --   ALT 18  --   ALKPHOS 114  --   BILITOT 1.3*  --   PROT 6.5  --   ALBUMIN  3.0* 2.3*   CBG: No results for input(s): GLUCAP in the last 168 hours.  Discharge time spent: greater than 30 minutes.  Signed: AIDA CHO, MD [facility] 09/30/2023

## 2023-09-30 NOTE — TOC Transition Note (Signed)
 Transition of Care [facility]) - Discharge Note   Patient Details  Name: Sheila [name] MRN: [id number] Date of Birth: 04-26-1960  Transition of [facility]) CM/SW Contact:  Alfonso Rummer, LCSW Phone Number: 09/30/2023, 4:41 PM   Clinical Narrative:        KEN DELENA Rummer completed chart review No TOC needs identified. TOC signing off      Patient Goals and CMS Choice            Discharge Placement                       Discharge Plan and Services Additional resources added to the After Visit Summary for                                       Social Drivers of Health (SDOH) Interventions SDOH Screenings   Food Insecurity: No Food Insecurity (09/27/2023)  Housing: Low Risk  (09/27/2023)  Transportation Needs: No Transportation Needs (09/27/2023)  Utilities: Not At Risk (09/27/2023)  Social Connections: Unknown (04/30/2023)  Tobacco Use: Medium Risk (09/27/2023)     Readmission Risk Interventions     No data to display

## 2023-10-02 LAB — CULTURE, BLOOD (ROUTINE X 2)
Culture: NO GROWTH
Special Requests: ADEQUATE

## 2023-10-26 ENCOUNTER — Emergency Department

## 2023-10-26 ENCOUNTER — Other Ambulatory Visit: Payer: Self-pay

## 2023-10-26 ENCOUNTER — Inpatient Hospital Stay
Admission: EM | Admit: 2023-10-26 | Discharge: 2023-11-04 | DRG: 641 | Disposition: A | Discharge status: Skilled Nursing Facility | Attending: Internal Medicine | Admitting: Internal Medicine

## 2023-10-26 DIAGNOSIS — Z8249 Family history of ischemic heart disease and other diseases of the circulatory system: Secondary | ICD-10-CM

## 2023-10-26 DIAGNOSIS — D72829 Elevated white blood cell count, unspecified: Secondary | ICD-10-CM

## 2023-10-26 DIAGNOSIS — F1021 Alcohol dependence, in remission: Secondary | ICD-10-CM | POA: Diagnosis present

## 2023-10-26 DIAGNOSIS — I10 Essential (primary) hypertension: Secondary | ICD-10-CM | POA: Diagnosis present

## 2023-10-26 DIAGNOSIS — H409 Unspecified glaucoma: Secondary | ICD-10-CM | POA: Diagnosis present

## 2023-10-26 DIAGNOSIS — Z881 Allergy status to other antibiotic agents status: Secondary | ICD-10-CM

## 2023-10-26 DIAGNOSIS — R200 Anesthesia of skin: Secondary | ICD-10-CM | POA: Diagnosis present

## 2023-10-26 DIAGNOSIS — K219 Gastro-esophageal reflux disease without esophagitis: Secondary | ICD-10-CM | POA: Diagnosis present

## 2023-10-26 DIAGNOSIS — R531 Weakness: Secondary | ICD-10-CM | POA: Diagnosis not present

## 2023-10-26 DIAGNOSIS — K5190 Ulcerative colitis, unspecified, without complications: Secondary | ICD-10-CM | POA: Diagnosis present

## 2023-10-26 DIAGNOSIS — E870 Hyperosmolality and hypernatremia: Secondary | ICD-10-CM | POA: Diagnosis present

## 2023-10-26 DIAGNOSIS — M1990 Unspecified osteoarthritis, unspecified site: Secondary | ICD-10-CM | POA: Diagnosis present

## 2023-10-26 DIAGNOSIS — Z880 Allergy status to penicillin: Secondary | ICD-10-CM

## 2023-10-26 DIAGNOSIS — R296 Repeated falls: Secondary | ICD-10-CM | POA: Diagnosis present

## 2023-10-26 DIAGNOSIS — E785 Hyperlipidemia, unspecified: Secondary | ICD-10-CM | POA: Diagnosis present

## 2023-10-26 DIAGNOSIS — E1142 Type 2 diabetes mellitus with diabetic polyneuropathy: Secondary | ICD-10-CM | POA: Diagnosis present

## 2023-10-26 DIAGNOSIS — Z833 Family history of diabetes mellitus: Secondary | ICD-10-CM

## 2023-10-26 DIAGNOSIS — J4489 Other specified chronic obstructive pulmonary disease: Secondary | ICD-10-CM | POA: Diagnosis present

## 2023-10-26 DIAGNOSIS — E876 Hypokalemia: Secondary | ICD-10-CM | POA: Diagnosis present

## 2023-10-26 DIAGNOSIS — R0789 Other chest pain: Secondary | ICD-10-CM | POA: Diagnosis not present

## 2023-10-26 DIAGNOSIS — Z96653 Presence of artificial knee joint, bilateral: Secondary | ICD-10-CM | POA: Diagnosis present

## 2023-10-26 DIAGNOSIS — Z79899 Other long term (current) drug therapy: Secondary | ICD-10-CM

## 2023-10-26 DIAGNOSIS — Z87891 Personal history of nicotine dependence: Secondary | ICD-10-CM

## 2023-10-26 DIAGNOSIS — Z7984 Long term (current) use of oral hypoglycemic drugs: Secondary | ICD-10-CM

## 2023-10-26 DIAGNOSIS — Z888 Allergy status to other drugs, medicaments and biological substances status: Secondary | ICD-10-CM

## 2023-10-26 DIAGNOSIS — K760 Fatty (change of) liver, not elsewhere classified: Secondary | ICD-10-CM | POA: Diagnosis present

## 2023-10-26 DIAGNOSIS — R270 Ataxia, unspecified: Secondary | ICD-10-CM | POA: Diagnosis present

## 2023-10-26 LAB — URINALYSIS, ROUTINE W REFLEX MICROSCOPIC
Bilirubin Urine: NEGATIVE
Glucose, UA: NEGATIVE mg/dL
Hgb urine dipstick: NEGATIVE
Ketones, ur: NEGATIVE mg/dL
Nitrite: NEGATIVE
Protein, ur: NEGATIVE mg/dL
Specific Gravity, Urine: 1.025 (ref 1.005–1.030)
pH: 5 (ref 5.0–8.0)

## 2023-10-26 LAB — COMPREHENSIVE METABOLIC PANEL WITH GFR
ALT: 13 U/L (ref 0–44)
AST: 45 U/L — ABNORMAL HIGH (ref 15–41)
Albumin: 3.1 g/dL — ABNORMAL LOW (ref 3.5–5.0)
Alkaline Phosphatase: 229 U/L — ABNORMAL HIGH (ref 38–126)
Anion gap: 10 (ref 5–15)
BUN: 19 mg/dL (ref 8–23)
CO2: 27 mmol/L (ref 22–32)
Calcium: 8.8 mg/dL — ABNORMAL LOW (ref 8.9–10.3)
Chloride: 105 mmol/L (ref 98–111)
Creatinine, Ser: 0.62 mg/dL (ref 0.44–1.00)
GFR, Estimated: >60 mL/min (ref >60)
Glucose, Bld: 116 mg/dL — ABNORMAL HIGH (ref 70–99)
Potassium: 3.6 mmol/L (ref 3.5–5.1)
Sodium: 142 mmol/L (ref 135–145)
Total Bilirubin: 0.9 mg/dL (ref 0.0–1.2)
Total Protein: 7.2 g/dL (ref 6.5–8.1)

## 2023-10-26 LAB — CBC
HCT: 35 % — ABNORMAL LOW (ref 36.0–46.0)
Hemoglobin: 11.3 g/dL — ABNORMAL LOW (ref 12.0–15.0)
MCH: 28.3 pg (ref 26.0–34.0)
MCHC: 32.3 g/dL (ref 30.0–36.0)
MCV: 87.5 fL (ref 80.0–100.0)
Platelets: 496 K/uL — ABNORMAL HIGH (ref 150–400)
RBC: 4 MIL/uL (ref 3.87–5.11)
RDW: 17.4 % — ABNORMAL HIGH (ref 11.5–15.5)
WBC: 27.5 K/uL — ABNORMAL HIGH (ref 4.0–10.5)
nRBC: 0.1 % (ref 0.0–0.2)

## 2023-10-26 LAB — CBG MONITORING, ED: Glucose-Capillary: 118 mg/dL — ABNORMAL HIGH (ref 70–99)

## 2023-10-26 LAB — LACTIC ACID, PLASMA: Lactic Acid, Venous: 1.4 mmol/L (ref 0.5–1.9)

## 2023-10-26 LAB — TROPONIN I (HIGH SENSITIVITY): Troponin I (High Sensitivity): 5 ng/L (ref <18)

## 2023-10-26 LAB — MAGNESIUM: Magnesium: 0.8 mg/dL — CL (ref 1.7–2.4)

## 2023-10-26 LAB — PROCALCITONIN: Procalcitonin: 3.16 ng/mL

## 2023-10-26 MED ORDER — MAGNESIUM OXIDE -MG SUPPLEMENT 400 (240 MG) MG PO TABS
400.0000 mg | ORAL_TABLET | Freq: Two times a day (BID) | ORAL | Status: DC
  Administered 2023-10-26 – 2023-10-28 (×4): 400 mg via ORAL
  Filled 2023-10-26 (×5): qty 1

## 2023-10-26 MED ORDER — METRONIDAZOLE 500 MG/100ML IV SOLN
500.0000 mg | Freq: Once | INTRAVENOUS | Status: AC
  Administered 2023-10-26: 500 mg via INTRAVENOUS
  Filled 2023-10-26: qty 100

## 2023-10-26 MED ORDER — MAGNESIUM SULFATE 4 GM/100ML IV SOLN
4.0000 g | Freq: Once | INTRAVENOUS | Status: AC
  Administered 2023-10-26: 4 g via INTRAVENOUS
  Filled 2023-10-26 (×2): qty 100

## 2023-10-26 MED ORDER — HYDROCODONE-ACETAMINOPHEN 5-325 MG PO TABS
1.0000 | ORAL_TABLET | ORAL | Status: DC | PRN
  Administered 2023-10-26: 1 via ORAL
  Administered 2023-10-27: 2 via ORAL
  Administered 2023-10-27 – 2023-10-28 (×3): 1 via ORAL
  Filled 2023-10-26: qty 2
  Filled 2023-10-26 (×4): qty 1

## 2023-10-26 MED ORDER — ACETAMINOPHEN 650 MG RE SUPP: 650.0000 mg | Freq: Four times a day (QID) | RECTAL | Status: DC | PRN

## 2023-10-26 MED ORDER — ENOXAPARIN SODIUM 40 MG/0.4ML IJ SOSY
40.0000 mg | PREFILLED_SYRINGE | INTRAMUSCULAR | Status: DC
  Administered 2023-10-26 – 2023-11-03 (×9): 40 mg via SUBCUTANEOUS
  Filled 2023-10-26 (×9): qty 0.4

## 2023-10-26 MED ORDER — SODIUM CHLORIDE 0.9 % IV SOLN
2.0000 g | Freq: Once | INTRAVENOUS | Status: AC
  Administered 2023-10-26: 2 g via INTRAVENOUS
  Filled 2023-10-26: qty 10

## 2023-10-26 MED ORDER — ACETAMINOPHEN 325 MG PO TABS: 650.0000 mg | ORAL_TABLET | Freq: Four times a day (QID) | ORAL | Status: DC | PRN

## 2023-10-26 MED ORDER — MESALAMINE 1.2 G PO TBEC
2.4000 g | DELAYED_RELEASE_TABLET | Freq: Every day | ORAL | Status: DC
  Administered 2023-10-27 – 2023-11-04 (×9): 2.4 g via ORAL
  Filled 2023-10-26 (×9): qty 2

## 2023-10-26 MED ORDER — SODIUM CHLORIDE 0.9 % IV BOLUS
500.0000 mL | Freq: Once | INTRAVENOUS | Status: AC
  Administered 2023-10-26: 500 mL via INTRAVENOUS

## 2023-10-26 MED ORDER — SODIUM CHLORIDE 0.9 % IV SOLN: INTRAVENOUS | Status: AC | PRN

## 2023-10-26 MED ORDER — VANCOMYCIN HCL IN DEXTROSE 1-5 GM/200ML-% IV SOLN
1000.0000 mg | Freq: Once | INTRAVENOUS | Status: AC
  Administered 2023-10-26: 1000 mg via INTRAVENOUS
  Filled 2023-10-26: qty 200

## 2023-10-26 NOTE — ED Triage Notes (Signed)
 Arrives from [facility] via [place].  Patient c/o weakness and left sided numbness x 1 month and now using a wheelchair.    20 g left forearm  123/ 84 CBG: 148 97 100% RA  BP 90/60 at [facility].

## 2023-10-26 NOTE — Plan of Care (Signed)
  Problem: Education: Goal: Knowledge of General Education information will improve Description: Including pain rating scale, medication(s)/side effects and non-pharmacologic comfort measures Outcome: Progressing   Problem: Clinical Measurements: Goal: Will remain free from infection Outcome: Progressing   Problem: Clinical Measurements: Goal: Ability to maintain clinical measurements within normal limits will improve Outcome: Progressing Goal: Will remain free from infection Outcome: Progressing Goal: Diagnostic test results will improve Outcome: Progressing Goal: Respiratory complications will improve Outcome: Progressing Goal: Cardiovascular complication will be avoided Outcome: Progressing   Problem: Activity: Goal: Risk for activity intolerance will decrease Outcome: Progressing

## 2023-10-26 NOTE — ED Notes (Signed)
 Patient given a snack

## 2023-10-26 NOTE — ED Triage Notes (Signed)
 Pt arrives via [facility] from [facility] with c/o dizziness  x1 week and right sided numbness that has been happening since May and weakness that started 3-4 weeks ago. Pt states that they just didn't come to get checked out because they had a doctors appt. Pt is A&Ox4 during triage.  Pt denies any pain or burning with urination.

## 2023-10-26 NOTE — ED Provider Notes (Signed)
 [facility] Provider Note    Event Date/Time   First MD Initiated Contact with Patient 10/26/23 1232     (approximate)   History   Dizziness   HPI  SUNI JARNAGIN is a 63 y.o. female with a history of UC on mesalamine , hypertension, gout, peripheral neuropathy, hypokalemia, GERD, and alcohol use disorder who presents with weakness.  The patient states that she is having difficulty standing and is unable to walk.  [name] having right sided arm and leg weakness and numbness.  She also ports difficulty speaking.  When asked, she endorses shortness of breath, cough, diarrhea, and generalized weakness.  The patient initially stated that the symptoms have been present for about a month, however then when asked further about what caused her to come to the hospital today specifically, she states that many of the symptoms have been present since May, and they were present when she was admitted to the hospital last month.  [name] unable to identify specific reason that she came to the hospital today as opposed to yesterday or last week.  I reviewed the past medical records.  The patient was admitted to the hospitalist service last month with abdominal pain and diarrhea due to gastroenteritis as well as hypokalemia.   Physical Exam   Triage Vital Signs: ED Triage Vitals  Encounter Vitals Group     BP 10/26/23 1145 112/64     Girls Systolic BP Percentile --      Girls Diastolic BP Percentile --      Boys Systolic BP Percentile --      Boys Diastolic BP Percentile --      Pulse Rate 10/26/23 1145 96     Resp 10/26/23 1145 18     Temp 10/26/23 1145 97.7 F (36.5 C)     Temp Source 10/26/23 1145 Oral     SpO2 10/26/23 1145 100 %     Weight 10/26/23 1145 131 lb (59.4 kg)     Height 10/26/23 1145 5' (1.524 m)     Head Circumference --      Peak Flow --      Pain Score 10/26/23 1154 9     Pain Loc --      Pain Education --      Exclude from Growth Chart --      Most recent vital signs: Vitals:   10/26/23 1145 10/26/23 1550  BP: 112/64 123/68  Pulse: 96 95  Resp: 18 17  Temp: 97.7 F (36.5 C) 98 F (36.7 C)  SpO2: 100% 99%     General: Alert oriented, comfortable appearing, no distress.  CV:  Good peripheral perfusion.  Resp:  Normal effort.  Lungs CTAB. Abd:  Soft and nontender.  No distention.  Other:  Bilateral lower extremity weakness, slightly worse on the right.  Very mild right upper extremity weakness compared to the left.  No facial droop.  Normal speech.  No ataxia.   ED Results / Procedures / Treatments   Labs (all labs ordered are listed, but only abnormal results are displayed) Labs Reviewed  COMPREHENSIVE METABOLIC PANEL WITH GFR - Abnormal; Notable for the following components:      Result Value   Glucose, Bld 116 (*)    Calcium  8.8 (*)    Albumin  3.1 (*)    AST 45 (*)    Alkaline Phosphatase 229 (*)    All other components within normal limits  CBC - Abnormal; Notable for the  following components:   WBC 27.5 (*)    Hemoglobin 11.3 (*)    HCT 35.0 (*)    RDW 17.4 (*)    Platelets 496 (*)    All other components within normal limits  URINALYSIS, ROUTINE W REFLEX MICROSCOPIC - Abnormal; Notable for the following components:   Color, Urine AMBER (*)    APPearance CLOUDY (*)    Leukocytes,Ua TRACE (*)    Bacteria, UA RARE (*)    All other components within normal limits  CBG MONITORING, ED - Abnormal; Notable for the following components:   Glucose-Capillary 118 (*)    All other components within normal limits  CULTURE, BLOOD (ROUTINE X 2)  CULTURE, BLOOD (ROUTINE X 2)  LACTIC ACID, PLASMA  PROCALCITONIN  TROPONIN I (HIGH SENSITIVITY)     EKG  ED ECG REPORT I, Waylon Cassis, the attending physician, personally viewed and interpreted this ECG.  Date: 10/26/2023 EKG Time: 1242 Rate: 93 Rhythm: normal sinus rhythm QRS Axis: normal Intervals: normal ST/T Wave abnormalities: Nonspecific T  wave abnormality Narrative Interpretation: no evidence of acute ischemia    RADIOLOGY  Chest x-ray: I independently viewed and interpreted the images; there is no focal consolidation or edema  CT head: No ICH or other acute abnormality  PROCEDURES:  Critical Care performed: No  Procedures   MEDICATIONS ORDERED IN ED: Medications  vancomycin  (VANCOCIN ) IVPB 1000 mg/200 mL premix (1,000 mg Intravenous New Bag/Given 10/26/23 1610)  sodium chloride  0.9 % bolus 500 mL (0 mLs Intravenous Stopped 10/26/23 1358)  aztreonam (AZACTAM) 2 g in sodium chloride  0.9 % 100 mL IVPB (0 g Intravenous Stopped 10/26/23 1505)  metroNIDAZOLE  (FLAGYL ) IVPB 500 mg (0 mg Intravenous Stopped 10/26/23 1608)     IMPRESSION / MDM / ASSESSMENT AND PLAN / ED COURSE  I reviewed the triage vital signs and the nursing notes.  63 year old female with PMH as noted above presents with generalized weakness as well as specific right sided weakness and numbness that have been present for at least a month, seemingly without any acute change today.  The patient is a somewhat poor historian and it was difficult to pinpoint if there are any acute changes in her symptoms or the specific reason that she presented to the ED today for these more subacute symptoms.  Physical exam is significant for mild right-sided weakness, but there are no other acute findings.  Differential diagnosis includes, but is not limited to, subacute CVA, peripheral neuropathy, dehydration, electrolyte abnormality, UTI or other infection.  CBC is significant for a white count of 27 which is somewhat concerning.  The patient is not on a steroid, so this is concerning for possible acute infection.  CMP shows a slight elevation of the alkaline phosphatase but no other acute findings.  Urinalysis is clear except for squamous cells and mucus.  We will obtain a CT head, chest x-ray, and additional lab workup to determine if there is any active  infection.  Patient's presentation is most consistent with acute complicated illness / injury requiring diagnostic workup.  ----------------------------------------- 4:31 PM on 10/26/2023 -----------------------------------------  CT head is negative for acute findings.  Lactic acid is normal.  However the procalcitonin is elevated which increases the risk of possible sepsis due to an unknown source.  I have ordered broad-spectrum antibiotics to cover empirically until the patient's blood cultures are resulted.  Given her weakness, difficulty with ambulation, and the concern for possible sepsis, she will need inpatient admission for further workup and monitoring.  I consulted Dr. Awanda from the hospitalist service; based on our discussion she agrees to evaluate patient for admission.   FINAL CLINICAL IMPRESSION(S) / ED DIAGNOSES   Final diagnoses:  Weakness  Leukocytosis, unspecified type     Rx / DC Orders   ED Discharge Orders     None        Note:  This document was prepared using Dragon voice recognition software and may include unintentional dictation errors.    Jacolyn Pae, MD 10/26/23 1635

## 2023-10-26 NOTE — H&P (Signed)
 History and Physical    Sheila [name] FMW:[id number] DOB: [birth year] DOA: 10/26/2023  PCP: Ricard Tawni KIDD, MD  Patient coming from: home  I have personally briefly reviewed patient's old medical records in [facility]  Chief Complaint: weakness and falls  HPI: Sheila [name] is a 63 y.o. female with medical history significant of ulcerative colitis, HTN, peripheral neuropathy, history of alcohol use disorder, chronic hypokalemia and hypomag who presented with weakness and falls.  Pt reported weakness and falling a lot for at least 2 weeks.  Weakness is mostly on her right side.  Pt used to walk with walker or cane, but now needs a wheelchair.  Pt reported associated numbness on her right arm and leg.  Pt reported diarrhea which is chronic, 3-4 episodes per day for months.  No other complaints.  No fever, dyspnea, chest pain, abdominal pain, dysuria.  ED Course: afebrile, pulse 96, BP 112/64, RR 18, sating 100% on room air.  Labs notable for alk phos 229, WBC 27.5, procal 3.16.  CXR and CT head no acute finding.  ED provider ordered aztreonam, flagyl  and vanc for empiric coverage and requested admission.  Assessment/Plan  # Right-sided weakness --Unlikely to be stroke, since weakness was present for weeks and CT head neg for recent stroke.  Obtained mag level which came back low at 0.8, likely the reason for pt's weakness. --supplement mag --PT eval  # Hypomag, acute on chronic --pt has a hx of hypomag.  Does not appear to be on mag supplement at home. --monitor and supplement with IV --start oral mag ox  # Hx of hypokalemia --hold home potassium supplement for now since potassium level wnl  # HTN --BP currently low normal --hold home anti-hypertensives  # Hx of ulcerative colitis --may be the cause of her chronic diarrhea --cont home mesalamine  --outpatient f/u with GI   DVT prophylaxis: Lovenox  SQ Code Status: Full code  Family Communication:    Disposition Plan: to be determined  Consults called:  Level of care: Med-Surg   Review of Systems: As per HPI otherwise complete review of systems negative.   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 ENDOSCOPY;  Service: Endoscopy;  Laterality: N/A;   COLONOSCOPY WITH PROPOFOL  N/A 12/09/2015   Procedure: COLONOSCOPY WITH PROPOFOL ;  Surgeon: Gladis RAYMOND Mariner, MD;  Location: [facility] ENDOSCOPY;  Service: Endoscopy;  Laterality: N/A;   COLONOSCOPY WITH PROPOFOL  N/A 04/29/2021   Procedure: COLONOSCOPY WITH PROPOFOL ;  Surgeon: Toledo, Ladell POUR, MD;  Location: [facility] ENDOSCOPY;  Service: Gastroenterology;  Laterality: N/A;  DM   ESOPHAGOGASTRODUODENOSCOPY (EGD) WITH PROPOFOL  N/A 11/08/2017   Procedure: ESOPHAGOGASTRODUODENOSCOPY (EGD) WITH PROPOFOL ;  Surgeon: Gaylyn Gladis PENNER, MD;  Location: [facility] ENDOSCOPY;  Service: Endoscopy;  Laterality: N/A;   FRACTURE SURGERY     JOINT REPLACEMENT Left 04/19/2006   JOINT REPLACEMENT Right 01/14/2015   ORIF FEMUR FRACTURE Left 02/05/2019   ORIF FEMUR  FRACTURE Left 02/05/2019   Procedure: OPEN REDUCTION INTERNAL FIXATION (ORIF) DISTAL FEMUR FRACTURE;  Surgeon: Celena Sharper, MD;  Location: [facility] OR;  Service: Orthopedics;  Laterality: Left;   TOTAL KNEE ARTHROPLASTY Left 2008   TOTAL KNEE ARTHROPLASTY Right 01/14/2015   Procedure: TOTAL KNEE ARTHROPLASTY;  Surgeon: Sharper Flake, MD;  Location: [facility] ORS;  Service: Orthopedics;  Laterality: Right;     reports that she quit smoking about 45 years ago. Her smoking use included cigarettes. She has never used smokeless tobacco. She reports current alcohol use. She reports that she does not use drugs.  Allergies  Allergen Reactions   Ampicillin Anaphylaxis   Erythromycin Anaphylaxis   Omeprazole Anaphylaxis   Penicillins Anaphylaxis and Other (See Comments)    Has patient had a PCN reaction causing immediate rash, facial/tongue/throat swelling, SOB or lightheadedness with hypotension: Yes Has patient had a PCN reaction causing severe rash involving mucus membranes or skin necrosis: No Has patient had a PCN reaction that required hospitalization: Unknown Has patient had a PCN reaction occurring within the last 10 years: Yes If all of the above answers are NO, then may proceed with Cephalosporin use.    Triamterene-Hctz Shortness Of Breath and Swelling   Quinapril Cough    Family History  Problem Relation Age of Onset   Hypertension Mother    Diabetes Father    Hypertension Father    Diabetes Brother    Hypertension Brother    Breast cancer Neg Hx      Prior to Admission medications   Medication Sig Start Date End Date Taking? Authorizing Provider  acetaminophen  (TYLENOL ) 500 MG tablet Take 1 tablet (500 mg total) by mouth every 12 (twelve) hours. 02/07/19   Deward Eck, PA-C  allopurinol  (ZYLOPRIM ) 100 MG tablet Take 100 mg by mouth daily. 08/04/17   [provider]  ascorbic acid  (VITAMIN C) 500 MG tablet Take 1 tablet (500 mg total) by mouth daily. 02/07/19   Deward Eck, PA-C   chlorthalidone  (HYGROTON ) 25 MG tablet Take 25 mg by mouth daily.  01/04/15   [provider]  [facility] VITAMIN B12 1000 MCG tablet Take 1,000 mcg by mouth daily. 07/20/17   [provider]  folic acid  (FOLVITE ) 1 MG tablet Take 1 tablet (1 mg total) by mouth daily. 02/17/18   Gouru, Aruna, MD  gabapentin  (NEURONTIN ) 300 MG capsule Take 1 capsule (300 mg total) by mouth 2 (two) times daily. 09/30/23   Jens Durand, MD  KLOR-CON  M20 20 MEQ tablet Take 2 tablets (40 mEq total) by mouth daily. 02/16/18   Gouru, Aruna, MD  latanoprost  (XALATAN ) 0.005 % ophthalmic solution Place 1 drop into both eyes at bedtime.    [provider]  losartan  (COZAAR ) 25 MG tablet Take 25 mg by mouth daily.     [provider]  mesalamine  (LIALDA ) 1.2 g EC tablet Take 2.4 g by mouth daily with breakfast.    [provider]  metFORMIN  (GLUCOPHAGE -XR) 500 MG 24 hr tablet Take 500 mg by mouth 2 (two) times  daily.    [provider]  metoprolol  succinate (TOPROL -XL) 50 MG 24 hr tablet Take 50 mg by mouth at bedtime.    [provider]  pantoprazole  (PROTONIX ) 40 MG tablet Take 40 mg by mouth every morning.    [provider]  pravastatin  (PRAVACHOL ) 10 MG tablet Take 10 mg by mouth every morning.     [provider]  sucralfate  (CARAFATE ) 1 g tablet Take 1 g by mouth 2 (two) times daily.    [provider]  thiamine  100 MG tablet Take 1 tablet (100 mg total) by mouth daily. 02/17/18   Gouru, Aruna, MD  VITAMIN D -1000 MAX ST 25 MCG (1000 UT) tablet Take 1,000 Units by mouth daily. 02/18/23   [provider]    Physical Exam: Vitals:   10/26/23 1145 10/26/23 1550 10/26/23 1805  BP: 112/64 123/68 109/60  Pulse: 96 95 (!) 103  Resp: 18 17 18   Temp: 97.7 F (36.5 C) 98 F (36.7 C) 98.6 F (37 C)  TempSrc: Oral Oral Oral  SpO2: 100% 99% 99%  Weight: 59.4 kg    Height: 5' (1.524 m)      Constitutional: NAD, AAOx3 HEENT:  conjunctivae and lids normal, EOMI CV: No cyanosis.   RESP: normal respiratory effort, on RA Neuro: II - XII grossly intact.   Psych: Normal mood and affect.  Appropriate judgement and reason  Labs on Admission: I have personally reviewed labs and imaging studies  Time spent: 60 minutes  Ellouise Haber MD [facility]  If 7PM-7AM, please contact night-coverage 10/26/2023, 6:13 PM

## 2023-10-27 DIAGNOSIS — R531 Weakness: Secondary | ICD-10-CM | POA: Diagnosis not present

## 2023-10-27 LAB — CBC
HCT: 29 % — ABNORMAL LOW (ref 36.0–46.0)
Hemoglobin: 9.5 g/dL — ABNORMAL LOW (ref 12.0–15.0)
MCH: 28.3 pg (ref 26.0–34.0)
MCHC: 32.8 g/dL (ref 30.0–36.0)
MCV: 86.3 fL (ref 80.0–100.0)
Platelets: 423 K/uL — ABNORMAL HIGH (ref 150–400)
RBC: 3.36 MIL/uL — ABNORMAL LOW (ref 3.87–5.11)
RDW: 16.8 % — ABNORMAL HIGH (ref 11.5–15.5)
WBC: 24 K/uL — ABNORMAL HIGH (ref 4.0–10.5)
nRBC: 0.1 % (ref 0.0–0.2)

## 2023-10-27 LAB — BASIC METABOLIC PANEL WITH GFR
Anion gap: 12 (ref 5–15)
BUN: 12 mg/dL (ref 8–23)
CO2: 25 mmol/L (ref 22–32)
Calcium: 8.4 mg/dL — ABNORMAL LOW (ref 8.9–10.3)
Chloride: 103 mmol/L (ref 98–111)
Creatinine, Ser: <0.3 mg/dL — ABNORMAL LOW (ref 0.44–1.00)
Glucose, Bld: 126 mg/dL — ABNORMAL HIGH (ref 70–99)
Potassium: 2.5 mmol/L — CL (ref 3.5–5.1)
Sodium: 140 mmol/L (ref 135–145)

## 2023-10-27 LAB — POTASSIUM: Potassium: 3.9 mmol/L (ref 3.5–5.1)

## 2023-10-27 LAB — MAGNESIUM
Magnesium: 2.2 mg/dL (ref 1.7–2.4)
Magnesium: 1.7 mg/dL (ref 1.7–2.4)

## 2023-10-27 MED ORDER — POTASSIUM CHLORIDE 10 MEQ/100ML IV SOLN
10.0000 meq | INTRAVENOUS | Status: AC
  Administered 2023-10-27 (×2): 10 meq via INTRAVENOUS
  Filled 2023-10-27 (×2): qty 100

## 2023-10-27 MED ORDER — POTASSIUM CHLORIDE 20 MEQ PO PACK
40.0000 meq | PACK | Freq: Every day | ORAL | Status: DC
  Administered 2023-10-28: 40 meq via ORAL
  Filled 2023-10-27: qty 2

## 2023-10-27 MED ORDER — ORAL CARE MOUTH RINSE: 15.0000 mL | OROMUCOSAL | Status: DC | PRN

## 2023-10-27 MED ORDER — POTASSIUM CHLORIDE CRYS ER 20 MEQ PO TBCR
40.0000 meq | EXTENDED_RELEASE_TABLET | ORAL | Status: AC
  Administered 2023-10-27 (×2): 40 meq via ORAL
  Filled 2023-10-27 (×2): qty 2

## 2023-10-27 NOTE — Progress Notes (Signed)
  PROGRESS NOTE    Sheila [name]  FMW:[id number] DOB: 1960-07-03 DOA: 10/26/2023 PCP: Ricard Tawni KIDD, MD  [id number]/[id number]-AA  LOS: 0 days   Brief hospital course:   Assessment & Plan: Sheila [name] is a 63 y.o. female with medical history significant of ulcerative colitis, HTN, peripheral neuropathy, history of alcohol use disorder, chronic hypokalemia and hypomag who presented with weakness and falls for weeks.   # Right-sided weakness --Unlikely to be stroke, since weakness was present for weeks and CT head neg for recent stroke.  Obtained mag level which came back low at 0.8, likely the reason for pt's weakness. --supplement mag --PT eval --of weakness doesn't improve with supplementation, will pursue further imaging.     # Hypomag, acute on chronic --pt has a hx of hypomag.  Does not appear to be on mag supplement at home. --monitor and supplement with IV --cont mag ox (new)   # Hypokalemia, acute on chronic --cont home potasium 40 mEq daily --monitor and supplement PRN   # HTN --BP currently low normal --hold anti-hypertensives   # Hx of ulcerative colitis --may be the cause of her chronic diarrhea --cont home mesalamine  --outpatient f/u with GI  # Chronic leukocytosis --no source of infection so far --hold further abx and monitor   DVT prophylaxis: Lovenox  SQ Code Status: Full code  Family Communication:  Level of care: Med-Surg Dispo:   The patient is from: home Anticipated d/c is to: SNF rehab Anticipated d/c date is: 1-2 days   Subjective and Interval History:  Pt reported still feeling weak.  PT reported severe ataxia.  No diarrhea since presentation.   Objective: Vitals:   10/27/23 0443 10/27/23 0807 10/27/23 1524 10/27/23 1638  BP: 127/83 120/88 127/60 125/79  Pulse: 94 100 98 98  Resp: 18 19 18 17   Temp: 98 F (36.7 C) 97.6 F (36.4 C) 97.7 F (36.5 C) 98.1 F (36.7 C)  TempSrc:    Oral  SpO2: 98% 100% 98% 100%  Weight:       Height:        Intake/Output Summary (Last 24 hours) at 10/27/2023 1811 Last data filed at 10/27/2023 1628 Gross per 24 hour  Intake 869.88 ml  Output 850 ml  Net 19.88 ml   Filed Weights   10/26/23 1145  Weight: 59.4 kg    Examination:   Constitutional: NAD, AAOx3 HEENT: conjunctivae and lids normal, EOMI CV: No cyanosis.   RESP: normal respiratory effort, on RA Neuro: II - XII grossly intact.   Psych: Normal mood and affect.  Appropriate judgement and reason   Data Reviewed: I have personally reviewed labs and imaging studies  Time spent: 50 minutes  Ellouise Haber, MD [facility] If 7PM-7AM, please contact night-coverage 10/27/2023, 6:11 PM

## 2023-10-27 NOTE — Evaluation (Signed)
 Physical Therapy Evaluation Patient Details Name: Sheila [name] MRN: [id number] DOB: 1960/01/06 Today's Date: 10/27/2023  History of Present Illness  presented to ER secondary to progressive weakness (R > L) and multiple falls; admitted for management of generalized weakness, multiple electrolyte abnormalities  Clinical Impression  Patient resting in bed upon arrival to session; alert and oriented to basic information, follows commands and agreeable to participation with treatment session (with min encouragement).  Generally hesitant with mobility efforts, fearful of falling. Bilat UE/LE generally weak and deconditioned, with R > L, UE > LE weakness appreciated.  Demonstrates moderate sensory, coordination deficits and ataxia, dysmetria slightly worse R > L.  Appears minimally changed with electrolyte replacement since admission.  MD informed/aware of above-noted symptoms; question benefit of cervical imaging if symptoms persist. Currently requiring min assist for bed mobility; close sup (static) to min/mod assist (dynamic) for unsupported sitting balance; min/mod assist with RW for sit/stand, standing balance and bed/chair transfer.  Demonstrates very choppy, ataxic gait pattern (R > L) requiring constant use of RW and +1 physical assist to maintain balance, prevent fall.   Additional gait efforts deferred at this time due to generalized fatigue; will continue to assess/progress as appropriate in subsequent sessions. Would benefit from skilled PT to address above deficits and promote optimal return to PLOF.; recommend post-acute PT follow up as indicated by interdisciplinary care team.          If plan is discharge home, recommend the following: A lot of help with walking and/or transfers;A lot of help with bathing/dressing/bathroom   Can travel by private vehicle   No    Equipment Recommendations    Recommendations for Other Services       Functional Status Assessment Patient has  had a recent decline in their functional status and demonstrates the ability to make significant improvements in function in a reasonable and predictable amount of time.     Precautions / Restrictions Precautions Precautions: Fall Restrictions Weight Bearing Restrictions Per Provider Order: No      Mobility  Bed Mobility Overal bed mobility: Needs Assistance Bed Mobility: Supine to Sit     Supine to sit: Min assist          Transfers Overall transfer level: Needs assistance Equipment used: Rolling walker (2 wheels) Transfers: Sit to/from Stand, Bed to chair/wheelchair/BSC Sit to Stand: Min assist Stand pivot transfers: Min assist, Mod assist         General transfer comment: very choppy, ataxic gait pattern (R > L) requiring constant use of RW and +1 physical assist to maintain balance, prevent fall    Ambulation/Gait               General Gait Details: deferred this session  Stairs            Wheelchair Mobility     Tilt Bed    Modified Rankin (Stroke Patients Only)       Balance Overall balance assessment: Needs assistance Sitting-balance support: No upper extremity supported, Feet supported Sitting balance-Leahy Scale: Fair Sitting balance - Comments: maintains statically with close sup; min/mod assist for truncal ataxia and posterior LOB with R LE movement/RAMPs   Standing balance support: Bilateral upper extremity supported Standing balance-Leahy Scale: Poor                               Pertinent Vitals/Pain Pain Assessment Pain Assessment: No/denies pain    Home Living Family/patient  expects to be discharged to:: Private residence Living Arrangements: Alone   Type of Home: House Home Access: Ramped entrance       Home Layout: One level Home Equipment: Agricultural consultant (2 wheels);Cane - single point;Crutches;BSC/3in1;Shower seat;Wheelchair - manual Additional Comments: Had caregiver scheduled to start today, but  patient in hospital and unable/unavailable to initiate service    Prior Function Prior Level of Function : Independent/Modified Independent             Mobility Comments: At baseline, ambulatory with SPC.  With recent decline, has resorted to manual WC as primary mobility to mitigate fall risk. Does endorse multiple fall history, 20+ in previous six months. ADLs Comments: At baseline, pt endorses indep with ADL and light IADL. Schedules transportation and daughter has groceries delivered.  Was scheduled to initaite caregiver services, but admitted to hospital     Extremity/Trunk Assessment   Upper Extremity Assessment Upper Extremity Assessment: Generalized weakness (R UE grossly 4-/5 at shoulder and elbow, 3-/5 at wrist and hand.  Endorses generalized paresthesia elbow distally R UE.  L UE grossly 4/5 throughout.  Significant ataxia, dysmetria bilat UEs (R slightly > L))    Lower Extremity Assessment Lower Extremity Assessment: Generalized weakness (grossly at least 4-/5 bilat; moderate ataxia, coordination deficits with RAMPS)       Communication   Communication Communication: No apparent difficulties    Cognition Arousal: Alert Behavior During Therapy: WFL for tasks assessed/performed   PT - Cognitive impairments: No apparent impairments                         Following commands: Intact       Cueing Cueing Techniques: Verbal cues     General Comments      Exercises     Assessment/Plan    PT Assessment Patient needs continued PT services  PT Problem List Decreased strength;Decreased range of motion;Decreased safety awareness;Decreased activity tolerance;Decreased balance;Decreased mobility;Decreased coordination;Decreased knowledge of use of DME;Decreased knowledge of precautions;Impaired sensation       PT Treatment Interventions DME instruction;Gait training;Functional mobility training;Therapeutic activities;Therapeutic exercise;Balance  training;Neuromuscular re-education;Cognitive remediation;Patient/family education    PT Goals (Current goals can be found in the Care Plan section)  Acute Rehab PT Goals Patient Stated Goal: to return home PT Goal Formulation: With patient Time For Goal Achievement: 11/10/23 Potential to Achieve Goals: Fair    Frequency Min 3X/week     Co-evaluation               AM-PAC PT 6 Clicks Mobility  Outcome Measure Help needed turning from your back to your side while in a flat bed without using bedrails?: None Help needed moving from lying on your back to sitting on the side of a flat bed without using bedrails?: A Little Help needed moving to and from a bed to a chair (including a wheelchair)?: A Lot Help needed standing up from a chair using your arms (e.g., wheelchair or bedside chair)?: A Lot Help needed to walk in hospital room?: A Lot Help needed climbing 3-5 steps with a railing? : A Lot 6 Click Score: 15    End of Session Equipment Utilized During Treatment: Gait belt Activity Tolerance: Patient tolerated treatment well Patient left: in chair;with call bell/phone within reach;with chair alarm set Nurse Communication: Mobility status PT Visit Diagnosis: Muscle weakness (generalized) (M62.81);Difficulty in walking, not elsewhere classified (R26.2);Other symptoms and signs involving the nervous system (M70.101)    Time: 8459-8440 PT  Time Calculation (min) (ACUTE ONLY): 19 min   Charges:   PT Evaluation $PT Eval Moderate Complexity: 1 Mod   PT General Charges $$ ACUTE PT VISIT: 1 Visit         Cheyanna Strick H. Delores, PT, DPT, NCS 10/27/23, 4:40 PM [phone]

## 2023-10-27 NOTE — Progress Notes (Addendum)
   10/27/23 0807  Vitals  Temp 97.6 F (36.4 C)  BP 120/88  MAP (mmHg) 95  BP Location Right Arm  BP Method Automatic  Patient Position (if appropriate) Lying  Pulse Rate 100  Pulse Rate Source Monitor  Resp 19  MEWS COLOR  MEWS Score Color Green  Oxygen Therapy  SpO2 100 %  O2 Device Room Air  MEWS Score  MEWS Temp 0  MEWS Systolic 0  MEWS Pulse 0  MEWS RR 0  MEWS LOC 0  MEWS Score 0   Patients morning labs showing Potassium level of 2.5; IV potassium + oral potassium to  supplement. Patient has no complaints at this time.  No telemetry due to supplementation per provider as plan of care continues.

## 2023-10-27 NOTE — Plan of Care (Signed)
 Patient is pleasant. Alert and oriented x3 with episodes of confusion. Expiratory wheezes lung sounds, on room air no noted cough. Abdomen soft, last bowel movement 10/21, +bowel sounds. Voiding without any difficulty. +CMS, able to feel sensation, patient able to wiggle toes and finger,+dorsi/plantar flex, complaints of numbness or tingling on left arm and leg, right sided extremities stronger than left sided extremities. Tolerating diet. Patient repositioned every 2 hours, generalized weakness. Hourly rounding performed, fall precautions maintained, and call bell within reach.   Problem: Education: Goal: Knowledge of General Education information will improve Description: Including pain rating scale, medication(s)/side effects and non-pharmacologic comfort measures Outcome: Progressing   Problem: Health Behavior/Discharge Planning: Goal: Ability to manage health-related needs will improve Outcome: Progressing   Problem: Clinical Measurements: Goal: Ability to maintain clinical measurements within normal limits will improve Outcome: Progressing Goal: Will remain free from infection Outcome: Progressing Goal: Diagnostic test results will improve Outcome: Progressing Goal: Respiratory complications will improve Outcome: Progressing Goal: Cardiovascular complication will be avoided Outcome: Progressing   Problem: Activity: Goal: Risk for activity intolerance will decrease Outcome: Progressing   Problem: Nutrition: Goal: Adequate nutrition will be maintained Outcome: Progressing   Problem: Coping: Goal: Level of anxiety will decrease Outcome: Progressing   Problem: Elimination: Goal: Will not experience complications related to bowel motility Outcome: Progressing Goal: Will not experience complications related to urinary retention Outcome: Progressing   Problem: Pain Managment: Goal: General experience of comfort will improve and/or be controlled Outcome: Progressing    Problem: Safety: Goal: Ability to remain free from injury will improve Outcome: Progressing   Problem: Skin Integrity: Goal: Risk for impaired skin integrity will decrease Outcome: Progressing

## 2023-10-27 NOTE — Plan of Care (Signed)
  Problem: Education: Goal: Knowledge of General Education information will improve Description: Including pain rating scale, medication(s)/side effects and non-pharmacologic comfort measures Outcome: Progressing   Problem: Activity: Goal: Risk for activity intolerance will decrease Outcome: Progressing   Problem: Nutrition: Goal: Adequate nutrition will be maintained Outcome: Progressing   Problem: Elimination: Goal: Will not experience complications related to bowel motility Outcome: Progressing   Problem: Pain Managment: Goal: General experience of comfort will improve and/or be controlled Outcome: Progressing   Problem: Skin Integrity: Goal: Risk for impaired skin integrity will decrease Outcome: Progressing

## 2023-10-28 DIAGNOSIS — H409 Unspecified glaucoma: Secondary | ICD-10-CM | POA: Diagnosis present

## 2023-10-28 DIAGNOSIS — Z7984 Long term (current) use of oral hypoglycemic drugs: Secondary | ICD-10-CM | POA: Diagnosis not present

## 2023-10-28 DIAGNOSIS — E1142 Type 2 diabetes mellitus with diabetic polyneuropathy: Secondary | ICD-10-CM | POA: Diagnosis present

## 2023-10-28 DIAGNOSIS — R200 Anesthesia of skin: Secondary | ICD-10-CM | POA: Diagnosis present

## 2023-10-28 DIAGNOSIS — F1021 Alcohol dependence, in remission: Secondary | ICD-10-CM | POA: Diagnosis present

## 2023-10-28 DIAGNOSIS — I10 Essential (primary) hypertension: Secondary | ICD-10-CM | POA: Diagnosis present

## 2023-10-28 DIAGNOSIS — Z87891 Personal history of nicotine dependence: Secondary | ICD-10-CM | POA: Diagnosis not present

## 2023-10-28 DIAGNOSIS — K219 Gastro-esophageal reflux disease without esophagitis: Secondary | ICD-10-CM | POA: Diagnosis present

## 2023-10-28 DIAGNOSIS — Z881 Allergy status to other antibiotic agents status: Secondary | ICD-10-CM | POA: Diagnosis not present

## 2023-10-28 DIAGNOSIS — E870 Hyperosmolality and hypernatremia: Secondary | ICD-10-CM | POA: Diagnosis present

## 2023-10-28 DIAGNOSIS — R296 Repeated falls: Secondary | ICD-10-CM | POA: Diagnosis present

## 2023-10-28 DIAGNOSIS — Z96653 Presence of artificial knee joint, bilateral: Secondary | ICD-10-CM | POA: Diagnosis present

## 2023-10-28 DIAGNOSIS — R270 Ataxia, unspecified: Secondary | ICD-10-CM | POA: Diagnosis present

## 2023-10-28 DIAGNOSIS — R531 Weakness: Secondary | ICD-10-CM | POA: Diagnosis present

## 2023-10-28 DIAGNOSIS — K760 Fatty (change of) liver, not elsewhere classified: Secondary | ICD-10-CM | POA: Diagnosis present

## 2023-10-28 DIAGNOSIS — Z8249 Family history of ischemic heart disease and other diseases of the circulatory system: Secondary | ICD-10-CM | POA: Diagnosis not present

## 2023-10-28 DIAGNOSIS — E785 Hyperlipidemia, unspecified: Secondary | ICD-10-CM | POA: Diagnosis present

## 2023-10-28 DIAGNOSIS — K5190 Ulcerative colitis, unspecified, without complications: Secondary | ICD-10-CM | POA: Diagnosis present

## 2023-10-28 DIAGNOSIS — M1990 Unspecified osteoarthritis, unspecified site: Secondary | ICD-10-CM | POA: Diagnosis present

## 2023-10-28 DIAGNOSIS — E876 Hypokalemia: Secondary | ICD-10-CM | POA: Diagnosis present

## 2023-10-28 DIAGNOSIS — Z833 Family history of diabetes mellitus: Secondary | ICD-10-CM | POA: Diagnosis not present

## 2023-10-28 DIAGNOSIS — R0789 Other chest pain: Secondary | ICD-10-CM | POA: Diagnosis not present

## 2023-10-28 DIAGNOSIS — J4489 Other specified chronic obstructive pulmonary disease: Secondary | ICD-10-CM | POA: Diagnosis present

## 2023-10-28 LAB — BASIC METABOLIC PANEL WITH GFR
Anion gap: 9 (ref 5–15)
BUN: 8 mg/dL (ref 8–23)
CO2: 26 mmol/L (ref 22–32)
Calcium: 8.3 mg/dL — ABNORMAL LOW (ref 8.9–10.3)
Chloride: 106 mmol/L (ref 98–111)
Creatinine, Ser: 0.33 mg/dL — ABNORMAL LOW (ref 0.44–1.00)
GFR, Estimated: >60 mL/min (ref >60)
Glucose, Bld: 159 mg/dL — ABNORMAL HIGH (ref 70–99)
Potassium: 3 mmol/L — ABNORMAL LOW (ref 3.5–5.1)
Sodium: 141 mmol/L (ref 135–145)

## 2023-10-28 LAB — CBC
HCT: 27.5 % — ABNORMAL LOW (ref 36.0–46.0)
Hemoglobin: 9.1 g/dL — ABNORMAL LOW (ref 12.0–15.0)
MCH: 28.7 pg (ref 26.0–34.0)
MCHC: 33.1 g/dL (ref 30.0–36.0)
MCV: 86.8 fL (ref 80.0–100.0)
Platelets: 370 K/uL (ref 150–400)
RBC: 3.17 MIL/uL — ABNORMAL LOW (ref 3.87–5.11)
RDW: 17.2 % — ABNORMAL HIGH (ref 11.5–15.5)
WBC: 20.3 K/uL — ABNORMAL HIGH (ref 4.0–10.5)
nRBC: 0.1 % (ref 0.0–0.2)

## 2023-10-28 LAB — MAGNESIUM: Magnesium: 1.3 mg/dL — ABNORMAL LOW (ref 1.7–2.4)

## 2023-10-28 MED ORDER — POLYETHYLENE GLYCOL 3350 17 G PO PACK
17.0000 g | PACK | Freq: Two times a day (BID) | ORAL | Status: DC | PRN
  Filled 2023-10-28 (×2): qty 1

## 2023-10-28 MED ORDER — HYDROCODONE-ACETAMINOPHEN 5-325 MG PO TABS
1.0000 | ORAL_TABLET | Freq: Three times a day (TID) | ORAL | Status: DC | PRN
  Administered 2023-10-31 – 2023-11-01 (×2): 1 via ORAL
  Filled 2023-10-28 (×2): qty 1

## 2023-10-28 MED ORDER — ACETAMINOPHEN 500 MG PO TABS
1000.0000 mg | ORAL_TABLET | Freq: Three times a day (TID) | ORAL | Status: DC | PRN
  Administered 2023-11-03: 1000 mg via ORAL
  Filled 2023-10-28: qty 2

## 2023-10-28 MED ORDER — POTASSIUM CHLORIDE CRYS ER 20 MEQ PO TBCR
40.0000 meq | EXTENDED_RELEASE_TABLET | Freq: Every day | ORAL | Status: DC
  Administered 2023-10-29: 40 meq via ORAL
  Filled 2023-10-28: qty 2

## 2023-10-28 MED ORDER — MAGNESIUM OXIDE -MG SUPPLEMENT 400 (240 MG) MG PO TABS
800.0000 mg | ORAL_TABLET | Freq: Two times a day (BID) | ORAL | Status: DC
Start: 2023-10-28 — End: 2023-11-04
  Administered 2023-10-28 – 2023-11-04 (×14): 800 mg via ORAL
  Filled 2023-10-28 (×14): qty 2

## 2023-10-28 MED ORDER — HYDRALAZINE HCL 20 MG/ML IJ SOLN: 10.0000 mg | Freq: Four times a day (QID) | INTRAMUSCULAR | Status: DC | PRN

## 2023-10-28 MED ORDER — MAGNESIUM SULFATE 4 GM/100ML IV SOLN
4.0000 g | Freq: Once | INTRAVENOUS | Status: AC
  Administered 2023-10-28: 4 g via INTRAVENOUS
  Filled 2023-10-28: qty 100

## 2023-10-28 MED ORDER — POTASSIUM CHLORIDE CRYS ER 20 MEQ PO TBCR
40.0000 meq | EXTENDED_RELEASE_TABLET | Freq: Once | ORAL | Status: AC
  Administered 2023-10-28: 40 meq via ORAL
  Filled 2023-10-28: qty 2

## 2023-10-28 NOTE — Progress Notes (Signed)
  PROGRESS NOTE    Sheila [name]  FMW:[id number] DOB: 04/29/1960 DOA: 10/26/2023 PCP: Ricard Tawni KIDD, MD  [id number]/[id number]-AA  LOS: 0 days   Brief hospital course:   Assessment & Plan: Sheila [name] is a 63 y.o. female with medical history significant of ulcerative colitis, HTN, peripheral neuropathy, history of alcohol use disorder, chronic hypokalemia and hypomag who presented with weakness and falls for weeks.   # Right-sided weakness # Frequent falls --associated with numbness.  Unlikely to be stroke, since weakness was present for weeks and CT head neg for recent stroke.  Obtained mag level which came back low at 0.8, likely the reason for pt's weakness.  Weakness also changes from side to side --supplement electrolytes --PT eval --of weakness doesn't improve with supplementation, will pursue further imaging.     # Hypomag, acute on chronic --pt has a hx of hypomag.  Does not appear to be on mag supplement at home. --monitor and supplement with IV --increase oral mag (new)   # Hypokalemia, acute on chronic --cont home potasium 40 mEq daily --monitor and supplement PRN   # HTN --BP currently low normal --hold anti-hypertensives   # Hx of ulcerative colitis --may be the cause of her chronic diarrhea --cont home mesalamine  --outpatient f/u with GI  # Chronic leukocytosis --no source of infection so far --hold further abx and monitor   DVT prophylaxis: Lovenox  SQ Code Status: Full code  Family Communication:  Level of care: Med-Surg Dispo:   The patient is from: home Anticipated d/c is to: SNF rehab Anticipated d/c date is: 1-2 days   Subjective and Interval History:  The side of weakness and numbness changed from right to left.  Pt also complained of pain in the distal sternal area, palpable.     Objective: Vitals:   10/28/23 0810 10/28/23 1531 10/28/23 1649 10/28/23 1957  BP: 134/74 (!) 140/58 (!) 117/52 120/66  Pulse: 98 (!) 102 99 (!) 101  Resp:  16 17 18 17   Temp: 97.9 F (36.6 C) 97.9 F (36.6 C)  98.2 F (36.8 C)  TempSrc:      SpO2: 99% 99% 97% 97%  Weight:      Height:        Intake/Output Summary (Last 24 hours) at 10/28/2023 2033 Last data filed at 10/28/2023 1018 Gross per 24 hour  Intake 120 ml  Output 600 ml  Net -480 ml   Filed Weights   10/26/23 1145  Weight: 59.4 kg    Examination:   Constitutional: NAD, alert, oriented HEENT: conjunctivae and lids normal, EOMI CV: No cyanosis.   RESP: normal respiratory effort, on RA Neuro: II - XII grossly intact.     Data Reviewed: I have personally reviewed labs and imaging studies  Time spent: 35 minutes  Ellouise Haber, MD [facility] If 7PM-7AM, please contact night-coverage 10/28/2023, 8:33 PM

## 2023-10-28 NOTE — Evaluation (Signed)
 Occupational Therapy Evaluation Patient Details Name: Sheila [name] MRN: [id number] DOB: [birth year] Today's Date: 10/28/2023   History of Present Illness   presented to ER secondary to progressive weakness (R > L) and multiple falls; admitted for management of generalized weakness, multiple electrolyte abnormalities     Clinical Impressions Patient presenting with decreased Ind in self care,balance, functional mobility/transfers, endurance, and safety awareness. Patient reports being Ind and living at home alone prior to several weeks ago and was ambulatory. Pt now endorses being mostly wheelchair level secondary to reporting over 20 falls. She states a family member has been assisting her with bathing and dressing. Pt is very weak and with very ataxic movements in B UEs during this session. She also endorses numbness in B UEs. Pt stands with min A and stand pivot transfers from recliner chair >bed with min A. Supine >sit with mod A. Call bell and all needed items within reach. Pt is far from baseline. Patient will benefit from acute OT to increase overall independence in the areas of ADLs, functional mobility, and safety awareness in order to safely discharge.     If plan is discharge home, recommend the following:   A lot of help with walking and/or transfers;A lot of help with bathing/dressing/bathroom;Assistance with feeding;Assistance with cooking/housework;Assist for transportation;Supervision due to cognitive status;Direct supervision/assist for financial management;Direct supervision/assist for medications management;Help with stairs or ramp for entrance     Functional Status Assessment   Patient has had a recent decline in their functional status and demonstrates the ability to make significant improvements in function in a reasonable and predictable amount of time.     Equipment Recommendations   None recommended by OT (defer to next venue of care)       Precautions/Restrictions   Precautions Precautions: Fall     Mobility Bed Mobility Overal bed mobility: Needs Assistance Bed Mobility: Sit to Supine       Sit to supine: Min assist        Transfers Overall transfer level: Needs assistance Equipment used: Rolling walker (2 wheels) Transfers: Sit to/from Stand, Bed to chair/wheelchair/BSC Sit to Stand: Min assist Stand pivot transfers: Min assist, Mod assist                Balance Overall balance assessment: Needs assistance Sitting-balance support: No upper extremity supported, Feet supported Sitting balance-Leahy Scale: Fair     Standing balance support: Bilateral upper extremity supported Standing balance-Leahy Scale: Poor                             ADL either performed or assessed with clinical judgement   ADL Overall ADL's : Needs assistance/impaired                         Toilet Transfer: Minimal assistance                    Pertinent Vitals/Pain Pain Assessment Pain Assessment: Faces Faces Pain Scale: Hurts a little bit Pain Location: L chest pain Pain Descriptors / Indicators: Discomfort Pain Intervention(s): Monitored during session, Limited activity within patient's tolerance, Repositioned     Extremity/Trunk Assessment Upper Extremity Assessment Upper Extremity Assessment: Generalized weakness   Lower Extremity Assessment Lower Extremity Assessment: Generalized weakness       Communication Communication Communication: No apparent difficulties   Cognition Arousal: Alert Behavior During Therapy: WFL for tasks assessed/performed  Following commands: Intact       Cueing  General Comments   Cueing Techniques: Verbal cues              Home Living Family/patient expects to be discharged to:: Private residence Living Arrangements: Alone   Type of Home: House Home Access: Ramped entrance      Home Layout: One level     Bathroom Shower/Tub: Producer, television/film/video: Handicapped height     Home Equipment: Agricultural consultant (2 wheels);Cane - single point;Crutches;BSC/3in1;Shower seat;Wheelchair - manual   Additional Comments: Had caregiver scheduled to start today, but patient in hospital and unable/unavailable to initiate service      Prior Functioning/Environment Prior Level of Function : Independent/Modified Independent             Mobility Comments: At baseline, ambulatory with SPC.  With recent decline, has resorted to manual WC as primary mobility to mitigate fall risk. Does endorse multiple fall history, 20+ in previous six months. ADLs Comments: At baseline, pt endorses indep with ADL and light IADL. Schedules transportation and daughter has groceries delivered.  Was scheduled to initaite caregiver services, but admitted to hospital    OT Problem List: Decreased strength;Decreased knowledge of use of DME or AE;Decreased activity tolerance;Impaired balance (sitting and/or standing);Decreased safety awareness   OT Treatment/Interventions: Self-care/ADL training;Therapeutic exercise;Patient/family education;Balance training;Energy conservation;Therapeutic activities      OT Goals(Current goals can be found in the care plan section)   Acute Rehab OT Goals Patient Stated Goal: to go home OT Goal Formulation: With patient Time For Goal Achievement: 11/11/23 Potential to Achieve Goals: Fair ADL Goals Pt Will Perform Grooming: with supervision;standing Pt Will Perform Lower Body Dressing: with supervision;sit to/from stand Pt Will Transfer to Toilet: with supervision;ambulating Pt Will Perform Toileting - Clothing Manipulation and hygiene: with supervision;sit to/from stand   OT Frequency:  Min 2X/week       AM-PAC OT 6 Clicks Daily Activity     Outcome Measure Help from another person eating meals?: A Little Help from another person taking care of  personal grooming?: A Little Help from another person toileting, which includes using toliet, bedpan, or urinal?: A Lot Help from another person bathing (including washing, rinsing, drying)?: A Lot Help from another person to put on and taking off regular upper body clothing?: A Lot Help from another person to put on and taking off regular lower body clothing?: A Lot 6 Click Score: 14   End of Session Equipment Utilized During Treatment: Rolling walker (2 wheels) Nurse Communication: Mobility status;Other (comment) (chest pain)  Activity Tolerance: Patient tolerated treatment well Patient left: in bed;with call bell/phone within reach  OT Visit Diagnosis: Unsteadiness on feet (R26.81);Repeated falls (R29.6);Muscle weakness (generalized) (M62.81)                Time: 8374-8359 OT Time Calculation (min): 15 min Charges:  OT General Charges $OT Visit: 1 Visit OT Evaluation $OT Eval Moderate Complexity: 1 [address] [place], OTR/L , CBIS ascom [phone]  10/28/23, 5:15 PM

## 2023-10-28 NOTE — Plan of Care (Signed)
 Alert and oriented x4 with intermittently confusion. Diminished lung sounds, on room air no noted cough. Abdomen soft, last bowel movement 10/24, +bowel sounds. Voiding without any difficulty. +CMS, able to feel sensation, patient able to wiggle toes and fingers,+dorsi/plantar flex, patient complians numbness and tingling in left sided extremities more then right sided, LUE and LLE noted to be weaker compared to right. PRN pain medication given with good affect in reduction of pain. Tolerating diet. Ambulating 1-2 person stand pivot with front wheel walker out of bed to bedside commode. Hourly rounding performed, fall precautions maintained, and call bell within reach.   Problem: Education: Goal: Knowledge of General Education information will improve Description: Including pain rating scale, medication(s)/side effects and non-pharmacologic comfort measures Outcome: Not Progressing   Problem: Health Behavior/Discharge Planning: Goal: Ability to manage health-related needs will improve Outcome: Not Progressing   Problem: Clinical Measurements: Goal: Ability to maintain clinical measurements within normal limits will improve Outcome: Not Progressing Goal: Will remain free from infection Outcome: Not Progressing Goal: Diagnostic test results will improve Outcome: Not Progressing Goal: Respiratory complications will improve Outcome: Not Progressing Goal: Cardiovascular complication will be avoided Outcome: Not Progressing   Problem: Activity: Goal: Risk for activity intolerance will decrease Outcome: Not Progressing   Problem: Nutrition: Goal: Adequate nutrition will be maintained Outcome: Not Progressing   Problem: Coping: Goal: Level of anxiety will decrease Outcome: Not Progressing   Problem: Elimination: Goal: Will not experience complications related to bowel motility Outcome: Not Progressing Goal: Will not experience complications related to urinary retention Outcome: Not  Progressing   Problem: Pain Managment: Goal: General experience of comfort will improve and/or be controlled Outcome: Not Progressing   Problem: Safety: Goal: Ability to remain free from injury will improve Outcome: Not Progressing   Problem: Skin Integrity: Goal: Risk for impaired skin integrity will decrease Outcome: Not Progressing

## 2023-10-28 NOTE — Progress Notes (Signed)
 Physical Therapy Treatment Patient Details Name: Sheila [name] MRN: [id number] DOB: 1960-09-05 Today's Date: 10/28/2023   History of Present Illness presented to ER secondary to progressive weakness (R > L) and multiple falls; admitted for management of generalized weakness, multiple electrolyte abnormalities    PT Comments  Pt received in bed, c/o L sided chest discomfort, VSS. MinA to transfer to EOB with use of bedside rail. Initial dizziness reported and quickly resolved. Pt required MinA to stand at [facility] and side step to bedside chair. Remains ataxic with noted L LE weakness and impaired motor control. Pt has had multiple falls with a dx in chart of Neuromuscular Disorder without specifics. Will continue to recommend STR at d/c as pt is not at functional baseline and will benefit from continued PT services.    If plan is discharge home, recommend the following: A lot of help with walking and/or transfers;A lot of help with bathing/dressing/bathroom   Can travel by private vehicle     No  Equipment Recommendations  None recommended by PT (TBD at next level of care. Pt has a RW at home)    Recommendations for Other Services       Precautions / Restrictions Precautions Precautions: Fall Restrictions Weight Bearing Restrictions Per Provider Order: No     Mobility  Bed Mobility Overal bed mobility: Needs Assistance Bed Mobility: Supine to Sit     Supine to sit: Min assist, Used rails          Transfers Overall transfer level: Needs assistance Equipment used: Rolling walker (2 wheels) Transfers: Sit to/from Stand, Bed to chair/wheelchair/BSC Sit to Stand: Min assist           General transfer comment: very choppy, ataxic gait pattern (R > L) requiring constant use of RW and +1 physical assist to maintain balance, prevent fall    Ambulation/Gait               General Gait Details: deferred this session   Stairs             Wheelchair  Mobility     Tilt Bed    Modified Rankin (Stroke Patients Only)       Balance Overall balance assessment: Needs assistance Sitting-balance support: No upper extremity supported, Feet supported Sitting balance-Leahy Scale: Fair Sitting balance - Comments: maintains statically with close sup; min/mod assist for truncal ataxia and posterior LOB with R LE movement/RAMPs   Standing balance support: Bilateral upper extremity supported Standing balance-Leahy Scale: Poor                              Communication Communication Communication: No apparent difficulties  Cognition Arousal: Alert Behavior During Therapy: WFL for tasks assessed/performed   PT - Cognitive impairments: No apparent impairments                         Following commands: Intact      Cueing Cueing Techniques: Verbal cues  Exercises General Exercises - Lower Extremity Ankle Circles/Pumps: AROM, Right, AAROM, Left, 10 reps, Seated Long Arc Quad: AROM, Both, 10 reps    General Comments General comments (skin integrity, edema, etc.): VSS throughout session.      Pertinent Vitals/Pain Pain Assessment Pain Assessment: Faces Faces Pain Scale: Hurts a little bit Pain Location:  (Left chest, L UE,LE, Nursing aware\) Pain Descriptors / Indicators: Discomfort Pain Intervention(s): Monitored during session  Home Living                          Prior Function            PT Goals (current goals can now be found in the care plan section) Acute Rehab PT Goals Patient Stated Goal: to return home    Frequency    Min 3X/week      PT Plan      Co-evaluation              AM-PAC PT 6 Clicks Mobility   Outcome Measure  Help needed turning from your back to your side while in a flat bed without using bedrails?: A Little Help needed moving from lying on your back to sitting on the side of a flat bed without using bedrails?: A Little Help needed moving to and  from a bed to a chair (including a wheelchair)?: A Lot Help needed standing up from a chair using your arms (e.g., wheelchair or bedside chair)?: A Lot Help needed to walk in hospital room?: A Lot Help needed climbing 3-5 steps with a railing? : A Lot 6 Click Score: 14    End of Session Equipment Utilized During Treatment: Gait belt Activity Tolerance: Patient tolerated treatment well Patient left: in chair;with call bell/phone within reach;with chair alarm set Nurse Communication: Mobility status PT Visit Diagnosis: Muscle weakness (generalized) (M62.81);Difficulty in walking, not elsewhere classified (R26.2);Other symptoms and signs involving the nervous system (R29.898)     Time: 8857-8792 PT Time Calculation (min) (ACUTE ONLY): 25 min  Charges:    $Therapeutic Exercise: 8-22 mins $Therapeutic Activity: 8-22 mins PT General Charges $$ ACUTE PT VISIT: 1 Visit                    Darice Bohr, PTA  Darice JAYSON Bohr 10/28/2023, 1:14 PM

## 2023-10-29 DIAGNOSIS — R531 Weakness: Secondary | ICD-10-CM | POA: Diagnosis not present

## 2023-10-29 LAB — CBC
HCT: 27 % — ABNORMAL LOW (ref 36.0–46.0)
Hemoglobin: 8.9 g/dL — ABNORMAL LOW (ref 12.0–15.0)
MCH: 28.4 pg (ref 26.0–34.0)
MCHC: 33 g/dL (ref 30.0–36.0)
MCV: 86.3 fL (ref 80.0–100.0)
Platelets: 355 K/uL (ref 150–400)
RBC: 3.13 MIL/uL — ABNORMAL LOW (ref 3.87–5.11)
RDW: 17.1 % — ABNORMAL HIGH (ref 11.5–15.5)
WBC: 21.3 K/uL — ABNORMAL HIGH (ref 4.0–10.5)
nRBC: 0.1 % (ref 0.0–0.2)

## 2023-10-29 LAB — BASIC METABOLIC PANEL WITH GFR
Anion gap: 10 (ref 5–15)
BUN: 6 mg/dL — ABNORMAL LOW (ref 8–23)
CO2: 26 mmol/L (ref 22–32)
Calcium: 8.7 mg/dL — ABNORMAL LOW (ref 8.9–10.3)
Chloride: 103 mmol/L (ref 98–111)
Creatinine, Ser: 0.54 mg/dL (ref 0.44–1.00)
GFR, Estimated: >60 mL/min (ref >60)
Glucose, Bld: 144 mg/dL — ABNORMAL HIGH (ref 70–99)
Potassium: 3.3 mmol/L — ABNORMAL LOW (ref 3.5–5.1)
Sodium: 139 mmol/L (ref 135–145)

## 2023-10-29 LAB — PHOSPHORUS: Phosphorus: 2 mg/dL — ABNORMAL LOW (ref 2.5–4.6)

## 2023-10-29 LAB — MAGNESIUM: Magnesium: 1.7 mg/dL (ref 1.7–2.4)

## 2023-10-29 MED ORDER — POTASSIUM PHOSPHATES 15 MMOLE/5ML IV SOLN
30.0000 mmol | Freq: Once | INTRAVENOUS | Status: AC
  Administered 2023-10-29: 30 mmol via INTRAVENOUS
  Filled 2023-10-29: qty 10

## 2023-10-29 NOTE — Plan of Care (Signed)

## 2023-10-29 NOTE — Progress Notes (Signed)
  PROGRESS NOTE    Sheila [name]  FMW:[id number] DOB: [birth year] DOA: 10/26/2023 PCP: Ricard Tawni KIDD, MD  155A/155A-AA  LOS: 1 day   Brief hospital course:   Assessment & Plan: Sheila [name] is a 63 y.o. female with medical history significant of ulcerative colitis, HTN, peripheral neuropathy, history of alcohol use disorder, chronic hypokalemia and hypomag who presented with weakness and falls for weeks.   # Right-sided weakness # Frequent falls --associated with numbness.  Unlikely to be stroke, since weakness was present for weeks and CT head neg for recent stroke.  Obtained mag level which came back low at 0.8, likely the reason for pt's weakness.  Weakness also changes from side to side --supplement electrolytes --PT/OT --of weakness doesn't improve with supplementation, will pursue further imaging.     # Hypomag, acute on chronic --pt has a hx of hypomag.  Does not appear to be on mag supplement at home. --monitor and supplement with IV --cont oral mag (new)   # Hypokalemia, acute on chronic --cont home potasium 40 mEq daily --monitor and supplement PRN   # HTN --BP currently low normal --hold anti-hypertensives   # Hx of ulcerative colitis --may be the cause of her chronic diarrhea --cont home mesalamine  --outpatient f/u with GI  # Chronic leukocytosis --no source of infection so far --hold further abx and monitor   DVT prophylaxis: Lovenox  SQ Code Status: Full code  Family Communication:  Level of care: Med-Surg Dispo:   The patient is from: home Anticipated d/c is to: SNF rehab Anticipated d/c date is: 1-2 days   Subjective and Interval History:  Pt reported weakness more on the left side today, but overall improved.   Objective: Vitals:   10/29/23 0336 10/29/23 0727 10/29/23 1441 10/29/23 1957  BP: 126/60 136/81 (!) 139/104 (!) 141/80  Pulse: 94 95 100 99  Resp: 18 18 16 16   Temp: 98.1 F (36.7 C) 99.4 F (37.4 C) 98 F (36.7 C)  98.5 F (36.9 C)  TempSrc:    Oral  SpO2: 99% 100% 100% 99%  Weight:      Height:        Intake/Output Summary (Last 24 hours) at 10/29/2023 2012 Last data filed at 10/29/2023 1917 Gross per 24 hour  Intake 600 ml  Output 750 ml  Net -150 ml   Filed Weights   10/26/23 1145  Weight: 59.4 kg    Examination:   Constitutional: NAD, alert, oriented HEENT: conjunctivae and lids normal, EOMI CV: No cyanosis.   RESP: normal respiratory effort, on RA Neuro: II - XII grossly intact.   Psych: Normal mood and affect.     Data Reviewed: I have personally reviewed labs and imaging studies  Time spent: 35 minutes  Ellouise Haber, MD [facility] If 7PM-7AM, please contact night-coverage 10/29/2023, 8:12 PM

## 2023-10-29 NOTE — Plan of Care (Signed)
 Pt calm and cooperative, respirations even and unlabored on RA

## 2023-10-30 ENCOUNTER — Inpatient Hospital Stay

## 2023-10-30 DIAGNOSIS — R531 Weakness: Secondary | ICD-10-CM | POA: Diagnosis not present

## 2023-10-30 LAB — BASIC METABOLIC PANEL WITH GFR
Anion gap: 10 (ref 5–15)
BUN: <5 mg/dL — ABNORMAL LOW (ref 8–23)
CO2: 26 mmol/L (ref 22–32)
Calcium: 7.9 mg/dL — ABNORMAL LOW (ref 8.9–10.3)
Chloride: 105 mmol/L (ref 98–111)
Creatinine, Ser: 0.38 mg/dL — ABNORMAL LOW (ref 0.44–1.00)
GFR, Estimated: >60 mL/min (ref >60)
Glucose, Bld: 130 mg/dL — ABNORMAL HIGH (ref 70–99)
Potassium: 3.4 mmol/L — ABNORMAL LOW (ref 3.5–5.1)
Sodium: 141 mmol/L (ref 135–145)

## 2023-10-30 LAB — PHOSPHORUS: Phosphorus: 5.4 mg/dL — ABNORMAL HIGH (ref 2.5–4.6)

## 2023-10-30 LAB — CBC
HCT: 27.3 % — ABNORMAL LOW (ref 36.0–46.0)
Hemoglobin: 9 g/dL — ABNORMAL LOW (ref 12.0–15.0)
MCH: 28.2 pg (ref 26.0–34.0)
MCHC: 33 g/dL (ref 30.0–36.0)
MCV: 85.6 fL (ref 80.0–100.0)
Platelets: 370 K/uL (ref 150–400)
RBC: 3.19 MIL/uL — ABNORMAL LOW (ref 3.87–5.11)
RDW: 17.2 % — ABNORMAL HIGH (ref 11.5–15.5)
WBC: 20.8 K/uL — ABNORMAL HIGH (ref 4.0–10.5)
nRBC: 0.1 % (ref 0.0–0.2)

## 2023-10-30 LAB — TROPONIN I (HIGH SENSITIVITY)
Troponin I (High Sensitivity): 8 ng/L (ref <18)
Troponin I (High Sensitivity): 8 ng/L (ref <18)

## 2023-10-30 LAB — MAGNESIUM: Magnesium: 1.6 mg/dL — ABNORMAL LOW (ref 1.7–2.4)

## 2023-10-30 LAB — GLUCOSE, CAPILLARY: Glucose-Capillary: 150 mg/dL — ABNORMAL HIGH (ref 70–99)

## 2023-10-30 MED ORDER — MAGNESIUM SULFATE 2 GM/50ML IV SOLN
2.0000 g | Freq: Once | INTRAVENOUS | Status: AC
  Administered 2023-10-30: 2 g via INTRAVENOUS
  Filled 2023-10-30: qty 50

## 2023-10-30 MED ORDER — ALUM & MAG HYDROXIDE-SIMETH 200-200-20 MG/5ML PO SUSP
30.0000 mL | ORAL | Status: DC | PRN
  Administered 2023-10-30: 30 mL via ORAL
  Filled 2023-10-30: qty 30

## 2023-10-30 MED ORDER — POTASSIUM CHLORIDE CRYS ER 20 MEQ PO TBCR
40.0000 meq | EXTENDED_RELEASE_TABLET | Freq: Two times a day (BID) | ORAL | Status: DC
  Administered 2023-10-30 – 2023-11-04 (×11): 40 meq via ORAL
  Filled 2023-10-30 (×11): qty 2

## 2023-10-30 NOTE — Significant Event (Signed)
 Rapid Response Event Note   Reason for Call : CALLED RRT FOR C/O CHEST/EPIGASTRIC PAIN   Initial Focused Assessment: SITTING UP IN BED, EKG IN PROCESS...      Interventions: DR LAI AT BEDSIDE TO ASSESS PT, STAT EKG, KUB, TROPONIN, AND MAALOX ORDERED.   Plan of Care: AS ABOVE    Event Summary: 1A NURSE TO CALL FOR FURTHER ASSISTANCE.  MD Notified: LAI Call Time:1109 Arrival Time:1111 End Time:1115  Marialy Urbanczyk A, RN

## 2023-10-30 NOTE — Progress Notes (Signed)
 {  Select_TRH_Note:[id number]}

## 2023-10-30 NOTE — Progress Notes (Signed)
   10/30/23 1113  Vitals  BP 123/63  MAP (mmHg) 69  BP Location Right Arm  BP Method Automatic  Patient Position (if appropriate) Lying  Pulse Rate 95  Pulse Rate Source Monitor  Resp 18  MEWS COLOR  MEWS Score Color Green  Oxygen Therapy  SpO2 100 %  MEWS Score  MEWS Temp 0  MEWS Systolic 0  MEWS Pulse 0  MEWS RR 0  MEWS LOC 0  MEWS Score 0   Rapid response called for chest pain and EKG reading; Dr Awanda bedside to assist.  Cardiac workup interventions ordered as plan of care continues.

## 2023-10-30 NOTE — Plan of Care (Signed)

## 2023-10-31 ENCOUNTER — Inpatient Hospital Stay

## 2023-10-31 DIAGNOSIS — R531 Weakness: Secondary | ICD-10-CM | POA: Diagnosis not present

## 2023-10-31 LAB — BASIC METABOLIC PANEL WITH GFR
Anion gap: 9 (ref 5–15)
BUN: 6 mg/dL — ABNORMAL LOW (ref 8–23)
CO2: 25 mmol/L (ref 22–32)
Calcium: 8.4 mg/dL — ABNORMAL LOW (ref 8.9–10.3)
Chloride: 106 mmol/L (ref 98–111)
Creatinine, Ser: 0.34 mg/dL — ABNORMAL LOW (ref 0.44–1.00)
GFR, Estimated: >60 mL/min (ref >60)
Glucose, Bld: 130 mg/dL — ABNORMAL HIGH (ref 70–99)
Potassium: 3.9 mmol/L (ref 3.5–5.1)
Sodium: 140 mmol/L (ref 135–145)

## 2023-10-31 LAB — CULTURE, BLOOD (ROUTINE X 2)
Culture: NO GROWTH
Culture: NO GROWTH

## 2023-10-31 LAB — PHOSPHORUS: Phosphorus: 3.3 mg/dL (ref 2.5–4.6)

## 2023-10-31 LAB — MAGNESIUM: Magnesium: 2 mg/dL (ref 1.7–2.4)

## 2023-10-31 MED ORDER — GADOBUTROL 1 MMOL/ML IV SOLN
5.0000 mL | Freq: Once | INTRAVENOUS | Status: AC | PRN
Start: 2023-10-31 — End: 2023-10-31
  Administered 2023-10-31: 5 mL via INTRAVENOUS

## 2023-10-31 NOTE — Progress Notes (Signed)
 Physical Therapy Treatment Patient Details Name: Sheila [name] MRN: [id number] DOB: 12/13/1960 Today's Date: 10/31/2023   History of Present Illness presented to ER secondary to progressive weakness (R > L) and multiple falls; admitted for management of generalized weakness, multiple electrolyte abnormalities    PT Comments  Pt received in bed for co-tx with OT to safely progress mobility and reassess status from previous weak. Pt without c/o chest pain this date, however continues to demonstrate weakness throughout. B LE grossly 3/5, PF/DF 4/5. Pt with peripheral neuropathy with decreased awareness of feet in space while ambulating, impaired motor control, and overcompensation with B knee hyperextension in stance phase to prevent buckling. No dizziness, pt fatigues quickly with 35ft of gait training w/ chair follow. Appears to have short and long term memory deficits. No significant change from previous week. MD to order MRI.   If plan is discharge home, recommend the following: A lot of help with walking and/or transfers;A lot of help with bathing/dressing/bathroom;Assistance with cooking/housework;Direct supervision/assist for medications management;Direct supervision/assist for financial management;Assist for transportation;Help with stairs or ramp for entrance;Supervision due to cognitive status   Can travel by private vehicle     No  Equipment Recommendations  None recommended by PT (TBD at next level of care. Pt is not safe to return home)    Recommendations for Other Services       Precautions / Restrictions Precautions Precautions: Fall Recall of Precautions/Restrictions: Intact Restrictions Weight Bearing Restrictions Per Provider Order: No     Mobility  Bed Mobility Overal bed mobility: Needs Assistance Bed Mobility: Sit to Supine     Supine to sit: Min assist, +2 for physical assistance, HOB elevated, Used rails     General bed mobility comments:  (Repeated cues  to transition to edge of bed, L UE weakness requiring increased assist)    Transfers Overall transfer level: Needs assistance Equipment used: Rolling walker (2 wheels) Transfers: Sit to/from Stand Sit to Stand: Min assist, +2 physical assistance (for safety to maintain standing balance while receiving total hygiene assist.)           General transfer comment: Pt remains ataxic, decreased proprioception and motor control B LE's, Hyperextension at B knees to compensate for weakness during stance phase. Poor eccentric control during stand to sit +2 for safety, high fall risk    Ambulation/Gait Ambulation/Gait assistance: Contact guard assist, +2 safety/equipment (Close CGA due to ataxia, +2 for chair follow, pt quick to fatigue) Gait Distance (Feet): 15 Feet Assistive device: Rolling walker (2 wheels) Gait Pattern/deviations: Step-through pattern, Decreased stride length, Decreased dorsiflexion - right, Decreased dorsiflexion - left, Knee hyperextension - right, Knee hyperextension - left, Ataxic, Narrow base of support Gait velocity:  (decr)     General Gait Details: Pt remains ataxic, high fall risk, poor awareness of foot placement in space and over compensating to prevent B knee buckling   Stairs             Wheelchair Mobility     Tilt Bed    Modified Rankin (Stroke Patients Only)       Balance Overall balance assessment: Needs assistance Sitting-balance support: No upper extremity supported, Feet supported Sitting balance-Leahy Scale: Fair Sitting balance - Comments: maintains statically with close sup   Standing balance support: Bilateral upper extremity supported, During functional activity, Reliant on assistive device for balance Standing balance-Leahy Scale: Poor Standing balance comment:  (Heavy reliance on RW to offset B LE weakness and potential buckling)  Communication Communication Communication: No apparent  difficulties  Cognition Arousal: Alert Behavior During Therapy: Flat affect   PT - Cognitive impairments: No family/caregiver present to determine baseline, Awareness, Memory, Sequencing, Problem solving, Safety/Judgement                       PT - Cognition Comments:  (Flat affect, poor short term and long term memory deficits) Following commands: Impaired Following commands impaired: Follows one step commands with increased time    Cueing Cueing Techniques: Verbal cues, Visual cues  Exercises      General Comments General comments (skin integrity, edema, etc.):  (B LE strength grossly 3/5, PF/DF 4/5)      Pertinent Vitals/Pain Pain Assessment Pain Assessment: No/denies pain    Home Living                          Prior Function            PT Goals (current goals can now be found in the care plan section) Acute Rehab PT Goals Patient Stated Goal: to return home    Frequency    Min 3X/week      PT Plan      Co-evaluation PT/OT/SLP Co-Evaluation/Treatment: Yes Reason for Co-Treatment: Complexity of the patient's impairments (multi-system involvement);For patient/therapist safety PT goals addressed during session: Mobility/safety with mobility;Balance;Proper use of DME        AM-PAC PT 6 Clicks Mobility   Outcome Measure  Help needed turning from your back to your side while in a flat bed without using bedrails?: A Lot Help needed moving from lying on your back to sitting on the side of a flat bed without using bedrails?: A Lot Help needed moving to and from a bed to a chair (including a wheelchair)?: A Lot Help needed standing up from a chair using your arms (e.g., wheelchair or bedside chair)?: A Lot Help needed to walk in hospital room?: A Lot Help needed climbing 3-5 steps with a railing? : A Lot 6 Click Score: 12    End of Session Equipment Utilized During Treatment: Gait belt Activity Tolerance: Patient tolerated treatment  well Patient left: in chair;with call bell/phone within reach;with chair alarm set Nurse Communication: Mobility status PT Visit Diagnosis: Muscle weakness (generalized) (M62.81);Difficulty in walking, not elsewhere classified (R26.2);Other symptoms and signs involving the nervous system (R29.898)     Time: 1025-1050 PT Time Calculation (min) (ACUTE ONLY): 25 min  Charges:    $Therapeutic Activity: 8-22 mins PT General Charges $$ ACUTE PT VISIT: 1 Visit                    Darice Bohr, PTA  Darice JAYSON Bohr 10/31/2023, 1:10 PM

## 2023-10-31 NOTE — NC FL2 (Signed)
 [place]  MEDICAID FL2 LEVEL OF CARE FORM     IDENTIFICATION  Patient Name: Sheila [name] Birthdate: 17-Sep-1960 Sex: female Admission Date (Current Location): 10/26/2023  [facility] and [place] Number:  Chiropodist and Address:  [facility], [address], [place], [place] [address]      Provider Number: [id number]  Attending Physician Name and Address:  Awanda City, MD  Relative Name and Phone Number:       Current Level of Care: Hospital Recommended Level of Care: Skilled Nursing Facility Prior Approval Number:    Date Approved/Denied:   PASRR Number: [id number]  Discharge Plan: Home    Current Diagnoses: Patient Active Problem List   Diagnosis Date Noted   Weakness 10/26/2023   Hypomagnesemia 04/30/2023   Alcoholic ketoacidosis, suspected 04/30/2023   Generalized weakness 04/30/2023   Personal history of falls 04/30/2023   Unintentional weight loss 04/30/2023   Ulcerative colitis (HCC)    COPD (chronic obstructive pulmonary disease) (HCC)    Neuromuscular disorder (HCC)    Closed displaced supracondylar fracture of distal end of left femur without intracondylar extension (HCC) 02/04/2019   Femur fracture (HCC) 02/04/2019   Leukocytosis 02/04/2019   Alcohol use 02/04/2019   Left leg weakness 02/14/2018   Elevated bilirubin 02/14/2018   SIRS (systemic inflammatory response syndrome) (HCC) 01/30/2018   Hypoglycemia 01/30/2018   Lactic acidosis 01/30/2018   Alcohol use disorder 01/30/2018   Abdominal pain, chronic, epigastric    Pancreatitis 08/29/2017   Diabetes (HCC) 08/29/2017   HTN (hypertension) 08/29/2017   HLD (hyperlipidemia) 08/29/2017   GERD (gastroesophageal reflux disease) 08/29/2017   Hypokalemia 08/29/2017   Hypocalcemia 08/29/2017   Primary osteoarthritis of right knee 01/14/2015    Orientation RESPIRATION BLADDER Height & Weight     Self, Time, Situation, Place  Normal Continent Weight: 131 lb (59.4  kg) Height:  5' (152.4 cm)  BEHAVIORAL SYMPTOMS/MOOD NEUROLOGICAL BOWEL NUTRITION STATUS      Continent Diet  AMBULATORY STATUS COMMUNICATION OF NEEDS Skin   Limited Assist Verbally Bruising (Traumatic Pretibal)                       Personal Care Assistance Level of Assistance  Bathing, Feeding, Dressing Bathing Assistance: Independent Feeding assistance: Limited assistance Dressing Assistance: Independent     Functional Limitations Info  Sight, Hearing, Speech Sight Info: Impaired Hearing Info: Adequate Speech Info: Adequate    SPECIAL CARE FACTORS FREQUENCY  PT (By licensed PT), OT (By licensed OT)     PT Frequency: 5x/week OT Frequency: 5x/week            Contractures      Additional Factors Info  Code Status, Allergies Code Status Info: Full Allergies Info: Ampicillin, Erythromycin, Omeprazole, Penicillins, Triamterene-hctz, Quinapril           Current Medications (10/31/2023):  This is the current hospital active medication list Current Facility-Administered Medications  Medication Dose Route Frequency Provider Last Rate Last Admin   acetaminophen  (TYLENOL ) tablet 1,000 mg  1,000 mg Oral TID PRN Awanda City, MD       alum & mag hydroxide-simeth (MAALOX/MYLANTA) 200-200-20 MG/5ML suspension 30 mL  30 mL Oral Q4H PRN Awanda City, MD   30 mL at 10/30/23 1117   enoxaparin  (LOVENOX ) injection 40 mg  40 mg Subcutaneous Q24H Awanda City, MD   40 mg at 10/30/23 1847   hydrALAZINE  (APRESOLINE ) injection 10 mg  10 mg Intravenous Q6H PRN Awanda City, MD  HYDROcodone -acetaminophen  (NORCO/VICODIN) 5-325 MG per tablet 1 tablet  1 tablet Oral Q8H PRN Awanda City, MD       magnesium  oxide (MAG-OX) tablet 800 mg  800 mg Oral BID Awanda City, MD   800 mg at 10/31/23 9050   mesalamine  (LIALDA ) EC tablet 2.4 g  2.4 g Oral Q breakfast Awanda City, MD   2.4 g at 10/31/23 9046   Oral care mouth rinse  15 mL Mouth Rinse PRN Awanda City, MD       polyethylene glycol (MIRALAX  / GLYCOLAX )  packet 17 g  17 g Oral BID PRN Awanda City, MD       potassium chloride  SA (KLOR-CON  M) CR tablet 40 mEq  40 mEq Oral BID Awanda City, MD   40 mEq at 10/31/23 9050     Discharge Medications: Please see discharge summary for a list of discharge medications.  Relevant Imaging Results:  Relevant Lab Results:   Additional Information [id number]  Jayde Mcallister  [place], LCSW

## 2023-10-31 NOTE — TOC Initial Note (Signed)
 Transition of Care [facility]) - Initial/Assessment Note    Patient Details  Name: Sheila [name] MRN: [id number] Date of Birth: 10-27-1960  Transition of Care [facility]) CM/SW Contact:    Alvaro Louder, LCSW Phone Number: 10/31/2023, 4:31 PM  Clinical Narrative:  Per Chart review patient from home PCP is Tawni Bramble. LCSWA Faxed out information to SNF's in [place]. LCSWA will present Facilities to patient at the bedside.                      Patient Goals and CMS Choice            Expected Discharge Plan and Services                                              Prior Living Arrangements/Services                       Activities of Daily Living      Permission Sought/Granted                  Emotional Assessment              Admission diagnosis:  Weakness [R53.1] Leukocytosis, unspecified type [D72.829] Patient Active Problem List   Diagnosis Date Noted   Weakness 10/26/2023   Hypomagnesemia 04/30/2023   Alcoholic ketoacidosis, suspected 04/30/2023   Generalized weakness 04/30/2023   Personal history of falls 04/30/2023   Unintentional weight loss 04/30/2023   Ulcerative colitis (HCC)    COPD (chronic obstructive pulmonary disease) (HCC)    Neuromuscular disorder (HCC)    Closed displaced supracondylar fracture of distal end of left femur without intracondylar extension (HCC) 02/04/2019   Femur fracture (HCC) 02/04/2019   Leukocytosis 02/04/2019   Alcohol use 02/04/2019   Left leg weakness 02/14/2018   Elevated bilirubin 02/14/2018   SIRS (systemic inflammatory response syndrome) (HCC) 01/30/2018   Hypoglycemia 01/30/2018   Lactic acidosis 01/30/2018   Alcohol use disorder 01/30/2018   Abdominal pain, chronic, epigastric    Pancreatitis 08/29/2017   Diabetes (HCC) 08/29/2017   HTN (hypertension) 08/29/2017   HLD (hyperlipidemia) 08/29/2017   GERD (gastroesophageal reflux disease) 08/29/2017   Hypokalemia 08/29/2017    Hypocalcemia 08/29/2017   Primary osteoarthritis of right knee 01/14/2015   PCP:  Bramble Tawni KIDD, MD Pharmacy:   [facility] [phone] - [address], [place] - [address] [place] [address] Phone: [phone] Fax: [phone]  [facility] [address] [place] [address] Phone: [phone] Fax: [phone]     Social Drivers of Health (SDOH) Social History: SDOH Screenings   Food Insecurity: No Food Insecurity (10/27/2023)  Housing: Low Risk  (10/27/2023)  Transportation Needs: Unmet Transportation Needs (10/27/2023)  Utilities: Not At Risk (10/27/2023)  Social Connections: Unknown (04/30/2023)  Tobacco Use: Medium Risk (10/26/2023)   SDOH Interventions:     Readmission Risk Interventions     No data to display

## 2023-10-31 NOTE — Progress Notes (Signed)
 Occupational Therapy Treatment Patient Details Name: JORIE ZEE MRN: [id number] DOB: 06-03-1960 Today's Date: 10/31/2023   History of present illness presented to ER secondary to progressive weakness (R > L) and multiple falls; admitted for management of generalized weakness, multiple electrolyte abnormalities   OT comments  Uppn entering the room, pt supine in bed and agreeable to OT and PT co-treatment. Pt performing bed mobility with min A of 2 to EOB. Pt stands and ambulated to [facility] as she reported need for BM. Pt noted to hyperextend B knees with mobility efforts. Pt needing assistance with hygiene and clothing management and second person to assist with balance in standing during toileting needs. Pt ambulating further in room with +2 assistance as pt needs chair follow for safety. Pt continues to have increased ataxia in B UEs as seen on evaluation. Pt seated in recliner chair at end of session with call bell and all needed items within reach. Chair alarm activated.       If plan is discharge home, recommend the following:  A lot of help with walking and/or transfers;A lot of help with bathing/dressing/bathroom;Assistance with feeding;Assistance with cooking/housework;Assist for transportation;Supervision due to cognitive status;Direct supervision/assist for financial management;Direct supervision/assist for medications management;Help with stairs or ramp for entrance   Equipment Recommendations  None recommended by OT (defer to next venue of session)       Precautions / Restrictions Precautions Precautions: Fall Recall of Precautions/Restrictions: Intact       Mobility Bed Mobility Overal bed mobility: Needs Assistance Bed Mobility: Sit to Supine     Supine to sit: Min assist, +2 for physical assistance, HOB elevated, Used rails          Transfers Overall transfer level: Needs assistance Equipment used: Rolling walker (2 wheels) Transfers: Sit to/from Stand Sit  to Stand: Min assist, +2 physical assistance                 Balance Overall balance assessment: Needs assistance Sitting-balance support: No upper extremity supported, Feet supported Sitting balance-Leahy Scale: Fair     Standing balance support: Bilateral upper extremity supported, During functional activity, Reliant on assistive device for balance Standing balance-Leahy Scale: Poor                             ADL either performed or assessed with clinical judgement   ADL Overall ADL's : Needs assistance/impaired                         Toilet Transfer: Minimal assistance;BSC/3in1;+2 for physical assistance;+2 for safety/equipment;Rolling walker (2 wheels)   Toileting- Clothing Manipulation and Hygiene: Maximal assistance Toileting - Clothing Manipulation Details (indicate cue type and reason): max A for clothing management and hygiene with second person to assist for balance     Functional mobility during ADLs: +2 for safety/equipment;+2 for physical assistance;Rolling walker (2 wheels)      Extremity/Trunk Assessment Upper Extremity Assessment Upper Extremity Assessment: Generalized weakness   Lower Extremity Assessment Lower Extremity Assessment: Generalized weakness        Vision Patient Visual Report: No change from baseline           Communication Communication Communication: No apparent difficulties   Cognition Arousal: Alert Behavior During Therapy: Flat affect  Following commands: Impaired Following commands impaired: Follows one step commands with increased time      Cueing   Cueing Techniques: Verbal cues, Visual cues        General Comments  (B LE strength grossly 3/5, PF/DF 4/5)    Pertinent Vitals/ Pain       Pain Assessment Pain Assessment: No/denies pain Faces Pain Scale: No hurt         Frequency  Min 2X/week        Progress Toward Goals  OT  Goals(current goals can now be found in the care plan section)  Progress towards OT goals: Progressing toward goals         Co-evaluation    PT/OT/SLP Co-Evaluation/Treatment: Yes Reason for Co-Treatment: For patient/therapist safety;Necessary to address cognition/behavior during functional activity;To address functional/ADL transfers;Complexity of the patient's impairments (multi-system involvement) PT goals addressed during session: Mobility/safety with mobility;Balance;Proper use of DME OT goals addressed during session: ADL's and self-care      AM-PAC OT 6 Clicks Daily Activity     Outcome Measure   Help from another person eating meals?: A Little Help from another person taking care of personal grooming?: A Little Help from another person toileting, which includes using toliet, bedpan, or urinal?: A Lot Help from another person bathing (including washing, rinsing, drying)?: A Lot Help from another person to put on and taking off regular upper body clothing?: A Lot Help from another person to put on and taking off regular lower body clothing?: A Lot 6 Click Score: 14    End of Session Equipment Utilized During Treatment: Rolling walker (2 wheels)  OT Visit Diagnosis: Unsteadiness on feet (R26.81);Repeated falls (R29.6);Muscle weakness (generalized) (M62.81)   Activity Tolerance Patient tolerated treatment well   Patient Left in bed;with call bell/phone within reach   Nurse Communication Mobility status;Other (comment)        Time: 8974-8946 OT Time Calculation (min): 28 min  Charges: OT General Charges $OT Visit: 1 Visit OT Evaluation $OT Eval Low Complexity: 1 Low OT Treatments $Self Care/Home Management : 8-22 mins  Izetta Claude, MS, OTR/L , CBIS ascom [phone]  10/31/23, 2:36 PM

## 2023-10-31 NOTE — Progress Notes (Signed)
  PROGRESS NOTE    Sheila [name]  FMW:[id number] DOB: 09-Oct-1960 DOA: 10/26/2023 PCP: Ricard Tawni KIDD, MD  155A/[id number]-AA  LOS: 3 days   Brief hospital course:   Assessment & Plan: Sheila [name] is a 63 y.o. female with medical history significant of ulcerative colitis, HTN, peripheral neuropathy, history of alcohol use disorder, chronic hypokalemia and hypomag who presented with weakness and falls for weeks.   # Right-sided weakness # Frequent falls --associated with numbness.  Unlikely to be stroke, since weakness was present for weeks and CT head neg for recent stroke.  Obtained mag level which came back low at 0.8, likely the reason for pt's weakness.  Weakness also changes from side to side --supplement electrolytes --PT/OT --MRI spine w w/o today   # Hypomag, acute on chronic --pt has a hx of hypomag.  Does not appear to be on mag supplement at home. --monitor and supplement with IV --cont oral mag (new)   # Hypokalemia, acute on chronic --cont home potasium 40 mEq daily --monitor and supplement PRN  # Hypophos --supplement PRN   # HTN --BP currently low normal --hold anti-hypertensives   # Hx of ulcerative colitis --may be the cause of her chronic diarrhea --cont home mesalamine  --outpatient f/u with GI  # Chronic leukocytosis --no source of infection so far --hold further abx and monitor  # Chest pain, atypical --rapid call for chest pain on 10/26.  Pain located in epigastric region.  Trop neg.  EKG neg for ACS-related changes.  Pt reported feeling bloated. --Maalox PRN   DVT prophylaxis: Lovenox  SQ Code Status: Full code  Family Communication:  Level of care: Med-Surg Dispo:   The patient is from: home Anticipated d/c is to: SNF rehab Anticipated d/c date is: 1-2 days   Subjective and Interval History:  PT eval found pt continued to have ataxia, rec spine imaging.  Pt reported chest pain improved.   Objective: Vitals:   10/31/23  0427 10/31/23 0658 10/31/23 0755 10/31/23 1525  BP: 92/71 (!) 140/98 134/72 (!) 144/90  Pulse: 84 97 96 99  Resp: 18 19 20 16   Temp: 98.6 F (37 C) 97.7 F (36.5 C) 97.9 F (36.6 C)   TempSrc:  Oral Oral   SpO2: 100% 97% 99% 100%  Weight:      Height:        Intake/Output Summary (Last 24 hours) at 10/31/2023 1902 Last data filed at 10/31/2023 0900 Gross per 24 hour  Intake 360 ml  Output 1000 ml  Net -640 ml   Filed Weights   10/26/23 1145  Weight: 59.4 kg    Examination:   Constitutional: NAD, alert, oriented HEENT: conjunctivae and lids normal, EOMI CV: No cyanosis.   RESP: normal respiratory effort, on RA Neuro: II - XII grossly intact.     Data Reviewed: I have personally reviewed labs and imaging studies  Time spent: 50 minutes  Ellouise Haber, MD [facility] If 7PM-7AM, please contact night-coverage 10/31/2023, 7:02 PM

## 2023-11-01 DIAGNOSIS — R531 Weakness: Secondary | ICD-10-CM | POA: Diagnosis not present

## 2023-11-01 LAB — POTASSIUM: Potassium: 4.8 mmol/L (ref 3.5–5.1)

## 2023-11-01 LAB — MAGNESIUM: Magnesium: 1.9 mg/dL (ref 1.7–2.4)

## 2023-11-01 LAB — PHOSPHORUS: Phosphorus: 3.6 mg/dL (ref 2.5–4.6)

## 2023-11-01 NOTE — Progress Notes (Signed)
 Mobility Specialist - Progress Note     11/01/23 1400  Mobility  Activity Stood at bedside;Pivoted/transferred to/from [facility];Ambulated with assistance  Level of Assistance Minimal assist, patient does 75% or more  Assistive Device Front wheel walker  Distance Ambulated (ft) 5 ft  Range of Motion/Exercises Active  Activity Response Tolerated well  Mobility Referral Yes  Mobility visit 1 Mobility  Mobility Specialist Start Time (ACUTE ONLY) 1412  Mobility Specialist Stop Time (ACUTE ONLY) 1427  Mobility Specialist Time Calculation (min) (ACUTE ONLY) 15 min   Pt resting in recliner on RA upon entry. Pt STS and transferred to [facility] then walked backwards toward bed MinA with RW. Pt left in bed with needs in reach and bed alarm activated.   Guido Rumble Mobility Specialist 11/01/23, 2:29 PM

## 2023-11-01 NOTE — Plan of Care (Signed)
   Problem: Clinical Measurements: Goal: Ability to maintain clinical measurements within normal limits will improve Outcome: Progressing Goal: Will remain free from infection Outcome: Progressing Goal: Diagnostic test results will improve Outcome: Progressing Goal: Respiratory complications will improve Outcome: Progressing

## 2023-11-01 NOTE — Progress Notes (Signed)
  PROGRESS NOTE    Sheila [name]  FMW:[id number] DOB: 11/13/1960 DOA: 10/26/2023 PCP: Ricard Tawni KIDD, MD  155A/155A-AA  LOS: 4 days   Brief hospital course:   Assessment & Plan: Sheila [name] is a 62 y.o. female with medical history significant of ulcerative colitis, HTN, peripheral neuropathy, history of alcohol use disorder, chronic hypokalemia and hypomag who presented with weakness and falls for weeks.   # Right-sided weakness # Frequent falls # ataxia --associated with numbness.  Unlikely to be stroke, since weakness was present for weeks and CT head neg for recent stroke.  Weakness also changes from side to side.  Weakness initially thought to be due to electrolyte abnormalities, however, even after electrolytes all normalized, weakness persists.   --PT/OT found pt has severe ataxia and queried about spinal cord involvement, and rec MRI spine, which has only some lumbar findings which do not seem severe enough to cause pt's deficits.       --PT/OT, SNF rehab --consider neuro consult tomorrow    # Hypomag, acute on chronic --pt has a hx of hypomag.  Does not appear to be on mag supplement at home. --monitor and supplement with IV --cont oral mag (new)   # Hypokalemia, acute on chronic --pt reportedly takes potassium 40 mEq daily PTA. --cont home potassium 40 mEq as increased BID --monitor and supplement PRN  # Hypophos --supplement PRN   # HTN --BP currently low normal --hold anti-hypertensives   # Hx of ulcerative colitis --may be the cause of her chronic diarrhea --cont home mesalamine  --outpatient f/u with GI  # Chronic leukocytosis --no source of infection so far --hold further abx and monitor --outpatient hematology referral  # Chest pain, atypical --rapid call for chest pain on 10/26.  Pain located in epigastric region.  Trop neg.  EKG neg for ACS-related changes.  Pt reported feeling bloated. --Maalox PRN   DVT prophylaxis: Lovenox  SQ Code  Status: Full code  Family Communication:  Level of care: Med-Surg Dispo:   The patient is from: home Anticipated d/c is to: SNF rehab   Subjective and Interval History:  Pt reported weakness improved but still there.   Objective: Vitals:   11/01/23 0500 11/01/23 0845 11/01/23 1231 11/01/23 1629  BP: (!) 154/63 (!) 143/90 118/76 138/89  Pulse: 90 94 95 96  Resp: 18 16 15 17   Temp: 98.1 F (36.7 C) (!) 97.5 F (36.4 C) 98.7 F (37.1 C) 98.1 F (36.7 C)  TempSrc:   Oral   SpO2: 99% 98% 100% 99%  Weight:      Height:        Intake/Output Summary (Last 24 hours) at 11/01/2023 1911 Last data filed at 11/01/2023 1527 Gross per 24 hour  Intake 600 ml  Output --  Net 600 ml   Filed Weights   10/26/23 1145  Weight: 59.4 kg    Examination:   Constitutional: NAD, alert, oriented HEENT: conjunctivae and lids normal, EOMI CV: No cyanosis.   RESP: normal respiratory effort, on RA   Data Reviewed: I have personally reviewed labs and imaging studies  Time spent: 35 minutes  Ellouise Haber, MD [facility] If 7PM-7AM, please contact night-coverage 11/01/2023, 7:11 PM

## 2023-11-01 NOTE — Plan of Care (Signed)
 Patient is alert and oriented x4. Clear lung sounds, on room air no noted cough. Abdomen soft, last bowel movement 10/27, +bowel sounds. Voiding without any difficulty. +CMS, able to feel sensation, patient able to wiggle toes and fingers,+dorsi/plantar flex, complaints numbness or tingling on RUE and RLE. Tolerating diet. Ambulating 2 assist with front wheel walker out of bed. Hourly rounding performed, fall precautions maintained, and call bell within reach.   Problem: Education: Goal: Knowledge of General Education information will improve Description: Including pain rating scale, medication(s)/side effects and non-pharmacologic comfort measures Outcome: Progressing   Problem: Health Behavior/Discharge Planning: Goal: Ability to manage health-related needs will improve Outcome: Progressing   Problem: Clinical Measurements: Goal: Ability to maintain clinical measurements within normal limits will improve Outcome: Progressing Goal: Will remain free from infection Outcome: Progressing Goal: Diagnostic test results will improve Outcome: Progressing Goal: Respiratory complications will improve Outcome: Progressing Goal: Cardiovascular complication will be avoided Outcome: Progressing   Problem: Activity: Goal: Risk for activity intolerance will decrease Outcome: Progressing   Problem: Nutrition: Goal: Adequate nutrition will be maintained Outcome: Progressing   Problem: Coping: Goal: Level of anxiety will decrease Outcome: Progressing   Problem: Elimination: Goal: Will not experience complications related to bowel motility Outcome: Progressing Goal: Will not experience complications related to urinary retention Outcome: Progressing   Problem: Pain Managment: Goal: General experience of comfort will improve and/or be controlled Outcome: Progressing   Problem: Safety: Goal: Ability to remain free from injury will improve Outcome: Progressing   Problem: Skin  Integrity: Goal: Risk for impaired skin integrity will decrease Outcome: Progressing

## 2023-11-02 DIAGNOSIS — R531 Weakness: Secondary | ICD-10-CM | POA: Diagnosis not present

## 2023-11-02 LAB — PHOSPHORUS: Phosphorus: 4 mg/dL (ref 2.5–4.6)

## 2023-11-02 LAB — POTASSIUM: Potassium: 4.2 mmol/L (ref 3.5–5.1)

## 2023-11-02 LAB — MAGNESIUM: Magnesium: 1.8 mg/dL (ref 1.7–2.4)

## 2023-11-02 NOTE — Plan of Care (Signed)

## 2023-11-02 NOTE — Progress Notes (Signed)
 Occupational Therapy Treatment Patient Details Name: Sheila [name] MRN: [id number] DOB: 1960/03/30 Today's Date: 11/02/2023   History of present illness presented to ER secondary to progressive weakness (R > L) and multiple falls; admitted for management of generalized weakness, multiple electrolyte abnormalities   OT comments  Upon entering the room, pt having recently returned to bed and eating lunch. Pt observed to be eating soup with fork with spillage on lap and needing assistance to clean up. She then stated she was done eating and did not want anymore when therapist attempted to assist/correct. Pt given SLUMs this session. Pt reports living at home alone and driving prior to admission. She did finish high school.   Pt completed SLUMS examination this date scoring 9/30 indicating a positive screen for dementia. Of note, it is not within occupational therapy scope of practice to diagnose cognitive impairments, this screen indicates need for further testing. The SLUMS is a 30 point, 11 question screening questionnaire that tests orientation, memory, attention, and executive function. Pt with noted impairments in short term memory, problem solving, and executive function limiting ability to perform medication management, cook meals safely, pay bills, grocery shop, drive a care, etc. Pt needs 24/7 supervision for safety at home to ensure safety with every day life tasks per results of test.  Pt reports,  I feel like I am in school during assessment but is unable to correct any mistakes made. Pt remains in bed at end of session with call bell and all needed items within reach.        If plan is discharge home, recommend the following:  A lot of help with walking and/or transfers;A lot of help with bathing/dressing/bathroom;Assistance with feeding;Assistance with cooking/housework;Assist for transportation;Supervision due to cognitive status;Direct supervision/assist for financial  management;Direct supervision/assist for medications management;Help with stairs or ramp for entrance   Equipment Recommendations  None recommended by OT (defer to next venue)       Precautions / Restrictions Precautions Precautions: Fall Recall of Precautions/Restrictions: Intact              ADL either performed or assessed with clinical judgement    Extremity/Trunk Assessment Upper Extremity Assessment Upper Extremity Assessment: Generalized weakness   Lower Extremity Assessment Lower Extremity Assessment: Generalized weakness        Vision Patient Visual Report: No change from baseline           Communication Communication Communication: No apparent difficulties   Cognition Arousal: Alert Behavior During Therapy: Flat affect Cognition: Cognition impaired   Orientation impairments: Time, Situation Awareness: Intellectual awareness impaired, Online awareness impaired Memory impairment (select all impairments): Short-term memory, Working memory Attention impairment (select first level of impairment): Sustained attention Executive functioning impairment (select all impairments): Sequencing, Reasoning, Problem solving                   Following commands: Impaired Following commands impaired: Follows one step commands with increased time      Cueing   Cueing Techniques: Verbal cues, Visual cues               Frequency  Min 2X/week        Progress Toward Goals  OT Goals(current goals can now be found in the care plan section)  Progress towards OT goals: Progressing toward goals      AM-PAC OT 6 Clicks Daily Activity     Outcome Measure   Help from another person eating meals?: A Little Help from another person taking  care of personal grooming?: A Little Help from another person toileting, which includes using toliet, bedpan, or urinal?: A Lot Help from another person bathing (including washing, rinsing, drying)?: A Lot Help from  another person to put on and taking off regular upper body clothing?: A Lot Help from another person to put on and taking off regular lower body clothing?: A Lot 6 Click Score: 14    End of Session    OT Visit Diagnosis: Unsteadiness on feet (R26.81);Repeated falls (R29.6);Muscle weakness (generalized) (M62.81)   Activity Tolerance Patient tolerated treatment well   Patient Left in bed;with call bell/phone within reach;with bed alarm set   Nurse Communication          Time: 1345-1415 OT Time Calculation (min): 30 min  Charges: OT General Charges $OT Visit: 1 Visit OT Treatments $Therapeutic Activity: 23-37 mins  Izetta Claude, MS, OTR/L , CBIS ascom [phone]  11/02/23, 4:14 PM

## 2023-11-02 NOTE — TOC Progression Note (Signed)
 Transition of Care [facility]) - Progression Note    Patient Details  Name: Sheila [name] MRN: [id number] Date of Birth: 05-24-1960  Transition of [facility]) CM/SW Contact  Alvaro Louder, [place] Phone Number: 11/02/2023, 1:39 PM  Clinical Narrative:   Patient selected SNF [facility]. LCSWA reached out to admissions coordinator to start insurance auth for patient to admit to [facility]. Auth currently pending.   TOC to follow for discharge                    Expected Discharge Plan and Services                                               Social Drivers of Health (SDOH) Interventions SDOH Screenings   Food Insecurity: No Food Insecurity (10/27/2023)  Housing: Low Risk  (10/27/2023)  Transportation Needs: Unmet Transportation Needs (10/27/2023)  Utilities: Not At Risk (10/27/2023)  Social Connections: Unknown (04/30/2023)  Tobacco Use: Medium Risk (10/26/2023)    Readmission Risk Interventions     No data to display

## 2023-11-02 NOTE — Progress Notes (Signed)
 PROGRESS NOTE    Sheila [name]  FMW:[id number] DOB: August 22, 1960 DOA: 10/26/2023 PCP: Ricard Tawni KIDD, MD   Assessment & Plan:   Principal Problem:   Weakness  Assessment and Plan: Right-sided weakness: w/ frequent falls & ataxia. Associated w/ numbness. CT head showed no acute intracranial abnormality. Weakness also changes from side to side. MRI spine, which has only some lumbar findings which do not seem severe enough to cause pt's deficits. PT recs SNF   Hypomagnesemia: acute on chronic. Continue on mg supplement    Hypokalemia: WNL today    Hypophosphatemia: WNL today    HTN: BP is currently WNL   Hx of ulcerative colitis: continue on home dose of mesalamine . F/u outpatient w/ GI   Chronic leukocytosis: no source of infection so far. Holding abx. Will need to f/u outpatient w/ heme  Chest pain: atypical. Rapid call for chest pain on 10/26.  Pain located in epigastric region.  Trop neg.  EKG neg for ACS-related changes. Resolved       DVT prophylaxis: lovenox  Code Status: full  Family Communication:  Disposition Plan: waiting on SNF placement   Level of care: Med-Surg  Status is: Inpatient Remains inpatient appropriate because: waiting on SNF placement     Consultants:    Procedures:  Antimicrobials:   Subjective: Pt says weakness has improved today   Objective: Vitals:   11/01/23 1629 11/01/23 1933 11/02/23 0438 11/02/23 0732  BP: 138/89 (!) 102/90 (!) 137/94 128/86  Pulse: 96 (!) 101 93 95  Resp: 17 18 18 18   Temp: 98.1 F (36.7 C) 97.8 F (36.6 C) 98 F (36.7 C) 98 F (36.7 C)  TempSrc:  Oral Oral Oral  SpO2: 99% 100% 98% 100%  Weight:      Height:        Intake/Output Summary (Last 24 hours) at 11/02/2023 0952 Last data filed at 11/01/2023 2300 Gross per 24 hour  Intake 600 ml  Output --  Net 600 ml   Filed Weights   10/26/23 1145  Weight: 59.4 kg    Examination:  General exam: Appears calm and comfortable   Respiratory system: Clear to auscultation. Respiratory effort normal. Cardiovascular system: S1 & S2+. No rubs, gallops or clicks.  Gastrointestinal system: Abdomen is nondistended, soft and nontender. Normal bowel sounds heard. Central nervous system: Alert and oriented. Moves all extremities Psychiatry: Judgement and insight appears at baseline. Flat mood and affect    Data Reviewed: I have personally reviewed following labs and imaging studies  CBC: Recent Labs  Lab 10/26/23 1204 10/27/23 0434 10/28/23 0543 10/29/23 0439 10/30/23 0521  WBC 27.5* 24.0* 20.3* 21.3* 20.8*  HGB 11.3* 9.5* 9.1* 8.9* 9.0*  HCT 35.0* 29.0* 27.5* 27.0* 27.3*  MCV 87.5 86.3 86.8 86.3 85.6  PLT 496* 423* 370 355 370   Basic Metabolic Panel: Recent Labs  Lab 10/27/23 0434 10/27/23 1900 10/28/23 0543 10/29/23 0439 10/30/23 0521 10/31/23 0510 11/01/23 1132 11/02/23 0411  NA 140  --  141 139 141 140  --   --   K 2.5*   < > 3.0* 3.3* 3.4* 3.9 4.8 4.2  CL 103  --  106 103 105 106  --   --   CO2 25  --  26 26 26 25   --   --   GLUCOSE 126*  --  159* 144* 130* 130*  --   --   BUN 12  --  8 6* <5* 6*  --   --  CREATININE <0.30*  --  0.33* 0.54 0.38* 0.34*  --   --   CALCIUM  8.4*  --  8.3* 8.7* 7.9* 8.4*  --   --   MG 2.2   < > 1.3* 1.7 1.6* 2.0 1.9 1.8  PHOS  --   --   --  2.0* 5.4* 3.3 3.6 4.0   < > = values in this interval not displayed.   GFR: Estimated Creatinine Clearance: 58.8 mL/min (A) (by C-G formula based on SCr of 0.34 mg/dL (L)). Liver Function Tests: Recent Labs  Lab 10/26/23 1204  AST 45*  ALT 13  ALKPHOS 229*  BILITOT 0.9  PROT 7.2  ALBUMIN  3.1*   No results for input(s): LIPASE, AMYLASE in the last 168 hours. No results for input(s): AMMONIA in the last 168 hours. Coagulation Profile: No results for input(s): INR, PROTIME in the last 168 hours. Cardiac Enzymes: No results for input(s): CKTOTAL, CKMB, CKMBINDEX, TROPONINI in the last 168 hours. BNP  (last 3 results) No results for input(s): PROBNP in the last 8760 hours. HbA1C: No results for input(s): HGBA1C in the last 72 hours. CBG: Recent Labs  Lab 10/26/23 1205 10/30/23 1110  GLUCAP 118* 150*   Lipid Profile: No results for input(s): CHOL, HDL, LDLCALC, TRIG, CHOLHDL, LDLDIRECT in the last 72 hours. Thyroid  Function Tests: No results for input(s): TSH, T4TOTAL, FREET4, T3FREE, THYROIDAB in the last 72 hours. Anemia Panel: No results for input(s): VITAMINB12, FOLATE, FERRITIN, TIBC, IRON, RETICCTPCT in the last 72 hours. Sepsis Labs: Recent Labs  Lab 10/26/23 1327  PROCALCITON 3.16  LATICACIDVEN 1.4    Recent Results (from the past 240 hours)  Culture, blood (routine x 2)     Status: None   Collection Time: 10/26/23  1:27 PM   Specimen: BLOOD  Result Value Ref Range Status   Specimen Description BLOOD BLOOD RIGHT ARM  Final   Special Requests BOTTLES DRAWN AEROBIC AND ANAEROBIC BCLV  Final   Culture   Final    NO GROWTH 5 DAYS Performed at [facility], [address] [place], [place] [address]    Report Status 10/31/2023 FINAL  Final  Culture, blood (routine x 2)     Status: None   Collection Time: 10/26/23  1:27 PM   Specimen: BLOOD  Result Value Ref Range Status   Specimen Description BLOOD BLOOD LEFT ARM  Final   Special Requests BOTTLES DRAWN AEROBIC AND ANAEROBIC BCLV  Final   Culture   Final    NO GROWTH 5 DAYS Performed at [facility], [address], [place], [place] [address]    Report Status 10/31/2023 FINAL  Final         Radiology Studies: MR Lumbar Spine W Wo Contrast Result Date: 10/31/2023 EXAM: MR Lumbar Spine With and Without Intravenous Contrast. 10/31/2023 02:58:34 PM TECHNIQUE: Multiplanar multisequence MRI of the lumbar spine was performed with and without the administration of intravenous contrast. COMPARISON: None available CLINICAL HISTORY: ataxia. 5ml gadavist .  [name] is a 63 y.o. female with medical history significant of ulcerative colitis, HTN, peripheral neuropathy, history of alcohol use disorder, chronic hypokalemia and hypomag who presented with right side weakness and falls for ; weeks. FINDINGS: BONES AND ALIGNMENT: Normal vertebral body heights. Decreased T1 signal intensity within the visualized bone marrow, nonspecific, but most commonly related to anemia, smoking, or VCD. No abnormal enhancement. Normal alignment. SPINAL CORD: The conus medullaris terminates at the level of L1. SOFT TISSUES: No acute abnormality.  Subcentimeter simple right renal cyst noted, benign in appearance, no follow up imaging recommended. L1-L2: Mild disc bulge with reactive endplate spurring. Mild bilateral facet hypertrophy. No significant stenosis. L2-L3: Mild disc bulge. Mild bilateral facet hypertrophy. No significant stenosis. L3-L4: Mild disc bulge. Superimposed small left foraminal disc protrusion with annular fissure closely approximates the left L3 nerve root. Mild left greater than right facet hypertrophy. No spinal stenosis. Mild bilateral L3 foraminal narrowing. L4-L5: Mild disc bulge with reactive endplate spurring. Mild to moderate bilateral facet hypertrophy. Suspected subtle 3 to 4 mm synovial cyst at the medial aspect of the right L4-L5 facet. Resultant mild canal with mild to moderate right lateral recess stenosis. Mild bilateral L4 foraminal narrowing. L5-S1: Mild intervertebral disc space narrowing with disc desiccation and diffuse disc bulge. Reactive endplate spurring. Superimposed central disc protrusion indents the ventral thecal sac, slightly asymmetric to the right. Mild to moderate right worse than left facet hypertrophy. Resultant mild canal with moderate bilateral subarticular stenosis. Moderate bilateral L5 foraminal narrowing. IMPRESSION: 1. No acute abnormality within the lumbar spine. 2. Small left foraminal disc protrusion at L3-4  potentially affecting the exiting left L3 nerve root. 3. Multifactorial degenerative changes at L4-5 with resultant mild canal with mild to moderate right greater than left lateral recess stenosis as above. 4. Degenerative disc disease with facet hypertrophy at L5-S1 with resultant moderate bilateral subarticular stenosis, with moderate bilateral L5 foraminal narrowing. Electronically signed by: Morene Hoard MD 10/31/2023 07:12 PM EDT RP Workstation: [id number]   MR THORACIC SPINE W WO CONTRAST Result Date: 10/31/2023 EXAM: MRI THORACIC SPINE WITH AND WITHOUT INTRAVENOUS CONTRAST 10/31/2023 02:58:34 PM TECHNIQUE: Multiplanar multisequence MRI of the thoracic spine was performed with and without the administration of 5 mL gadobutrol  (GADAVIST ) 1 MMOL/ML injection. COMPARISON: None available. CLINICAL HISTORY: ataxia. 5ml gadavist . TEELA NARDUCCI is a 63 y.o. female with medical history significant of ulcerative colitis, HTN, peripheral neuropathy, history of alcohol use disorder, chronic hypokalemia and hypomag who presented with right side weakness and falls for ; weeks. FINDINGS: BONES AND ALIGNMENT: Normal alignment. Normal vertebral body heights. Mildly decreased T1 signal intensity within the visualized bone marrow, nonspecific, but most commonly related to anemia, smoking, or obesity. No abnormal enhancement. SPINAL CORD: Normal spinal cord volume. Normal spinal cord signal. SOFT TISSUES: Unremarkable. DEGENERATIVE CHANGES: Mild for age multilevel disc desiccation with noncompressive disc bulging seen throughout the mid Thoracic spine. No significant focal disc herniation. No significant facet disease. No canal or neural foraminal stenosis or evidence for neural impingement. VISUALIZED PORTIONS OF THE LIVER: 1 cm densely calcified lesion at the posterior aspect of the Right Hepatic lobe noted, nonspecific, but most likely benign. IMPRESSION: 1. No acute abnormality within the thoracic spine or  spinal cord. No findings to explain patient symptoms. 2. Mild for age thoracic spondylosis without significant stenosis or overt neural impingement. Electronically signed by: Morene Hoard MD 10/31/2023 07:03 PM EDT RP Workstation: [id number]   MR CERVICAL SPINE W WO CONTRAST Result Date: 10/31/2023 EXAM: MRI CERVICAL SPINE WITH AND WITHOUT CONTRAST 10/31/2023 02:58:34 PM TECHNIQUE: Multiplanar multisequence MRI of the cervical spine was performed with and without intravenous contrast. COMPARISON: None available. CLINICAL HISTORY: ataxia. 5ml gadavist . VERGIE ZAHM is a 63 y.o. female with medical history significant of ulcerative colitis, HTN, peripheral neuropathy, history of alcohol use disorder, chronic hypokalemia and hypomag who presented with right side weakness and falls for weeks. FINDINGS: BONES AND ALIGNMENT: Straightening of the normal cervical lordosis. Normal vertebral body heights. Decreased  T1 signal intensity within the visualized bone marrow, nonspecific, but most commonly related to anemia, smoking, or obesity. Mild for age multilevel disc desiccation seen throughout the cervical spine. SPINAL CORD: Normal spinal cord size. No abnormal spinal cord signal. SOFT TISSUES: No paraspinal mass. C2-C3: Mild disc desiccation. No significant disc bulge or focal disc herniation. No significant spinal stenosis. Foramina remain patent. No neural impingement. C3-C4: Mild disc desiccation. No significant disc bulge or focal disc herniation. No significant spinal stenosis. Foramina remain patent. No neural impingement. C4-C5: Mild disc desiccation. No significant disc bulge or focal disc herniation. No significant spinal stenosis. Foramina remain patent. No neural impingement. C5-C6: Mild disc desiccation. No significant disc bulge or focal disc herniation. No significant spinal stenosis. Foramina remain patent. No neural impingement. C6-C7: Mild disc desiccation. No significant disc bulge or  focal disc herniation. No significant spinal stenosis. Foramina remain patent. No neural impingement. C7-T1: Mild disc desiccation. No significant disc bulge or focal disc herniation. No significant spinal stenosis. Foramina remain patent. No neural impingement. IMPRESSION: 1. No acute findings. 2. Minor for age cervical spondylosis without significant stenosis or abnormal impingement. Electronically signed by: Morene Hoard MD 10/31/2023 06:49 PM EDT RP Workstation: [id number]        Scheduled Meds:  enoxaparin  (LOVENOX ) injection  40 mg Subcutaneous Q24H   magnesium  oxide  800 mg Oral BID   mesalamine   2.4 g Oral Q breakfast   potassium chloride   40 mEq Oral BID   Continuous Infusions:   LOS: 5 days       Anthony CHRISTELLA Pouch, MD [facility] Pager [phone]  If 7PM-7AM, please contact night-coverage [email or web address] 11/02/2023, 9:52 AM

## 2023-11-02 NOTE — Progress Notes (Signed)
 Physical Therapy Treatment Patient Details Name: Sheila [name] MRN: [id number] DOB: 12-07-1960 Today's Date: 11/02/2023   History of Present Illness presented to ER secondary to progressive weakness (R > L) and multiple falls; admitted for management of generalized weakness, multiple electrolyte abnormalities    PT Comments  Pt received up in chair, c/o arthritic pain in R hand, agreeable to PT session. Pt continues to require assistance for transfers and short distance gait with heavy reliance on RW and chair follow due to high risk for falls and knee buckling. MinA provided at times due to unsteadiness and decreased B LE motor control. No c/o dizziness. Initial recs for STR at d/c remain appropriate.    If plan is discharge home, recommend the following: A lot of help with walking and/or transfers;A lot of help with bathing/dressing/bathroom;Assistance with cooking/housework;Direct supervision/assist for medications management;Direct supervision/assist for financial management;Assist for transportation;Help with stairs or ramp for entrance;Supervision due to cognitive status   Can travel by private vehicle     No  Equipment Recommendations  None recommended by PT (TBD at next level of care)    Recommendations for Other Services       Precautions / Restrictions Precautions Precautions: Fall Recall of Precautions/Restrictions: Intact Restrictions Weight Bearing Restrictions Per Provider Order: No     Mobility  Bed Mobility               General bed mobility comments:  (Pt in chair pre/post session)    Transfers Overall transfer level: Needs assistance Equipment used: Rolling walker (2 wheels) Transfers: Sit to/from Stand Sit to Stand: Min assist (From recliner)           General transfer comment:  (Repeated cues for proper hand placement to facilitate standing. MinA to gain initial static standing balance at RW)    Ambulation/Gait Ambulation/Gait  assistance: Min assist, Contact guard assist Gait Distance (Feet): 15 Feet Assistive device: Rolling walker (2 wheels) Gait Pattern/deviations: Step-through pattern, Decreased stride length, Decreased dorsiflexion - right, Decreased dorsiflexion - left, Knee hyperextension - right, Knee hyperextension - left, Ataxic, Narrow base of support Gait velocity: decr     General Gait Details: Pt remains ataxic, high fall risk, poor awareness of foot placement in space and over compensating to prevent B knee buckling   Stairs             Wheelchair Mobility     Tilt Bed    Modified Rankin (Stroke Patients Only)       Balance Overall balance assessment: Needs assistance Sitting-balance support: No upper extremity supported, Feet supported Sitting balance-Leahy Scale: Fair Sitting balance - Comments: maintains statically with close sup   Standing balance support: Bilateral upper extremity supported, During functional activity, Reliant on assistive device for balance Standing balance-Leahy Scale: Poor Standing balance comment:  (High fall risk with any standing task)                            Communication Communication Communication: No apparent difficulties  Cognition Arousal: Alert Behavior During Therapy: Flat affect   PT - Cognitive impairments: No family/caregiver present to determine baseline, Awareness, Memory, Sequencing, Problem solving, Safety/Judgement                         Following commands: Impaired Following commands impaired: Follows one step commands with increased time    Cueing    Exercises General Exercises - Lower Extremity Ankle  Circles/Pumps: AROM, Right, AAROM, Left, 10 reps, Seated Long Arc Quad: AROM, Both, 10 reps    General Comments        Pertinent Vitals/Pain Pain Assessment Pain Assessment: Faces Faces Pain Scale: Hurts a little bit Pain Location:  (Right hand from arthritis) Pain Descriptors / Indicators:  Aching Pain Intervention(s): Limited activity within patient's tolerance    Home Living                          Prior Function            PT Goals (current goals can now be found in the care plan section) Acute Rehab PT Goals Patient Stated Goal: to return home    Frequency    Min 3X/week      PT Plan      Co-evaluation              AM-PAC PT 6 Clicks Mobility   Outcome Measure  Help needed turning from your back to your side while in a flat bed without using bedrails?: A Lot Help needed moving from lying on your back to sitting on the side of a flat bed without using bedrails?: A Lot Help needed moving to and from a bed to a chair (including a wheelchair)?: A Lot Help needed standing up from a chair using your arms (e.g., wheelchair or bedside chair)?: A Lot Help needed to walk in hospital room?: A Lot Help needed climbing 3-5 steps with a railing? : A Lot 6 Click Score: 12    End of Session Equipment Utilized During Treatment: Gait belt Activity Tolerance: Patient tolerated treatment well Patient left: in chair;with call bell/phone within reach;with chair alarm set Nurse Communication: Mobility status PT Visit Diagnosis: Muscle weakness (generalized) (M62.81);Difficulty in walking, not elsewhere classified (R26.2);Other symptoms and signs involving the nervous system (R29.898)     Time: 8856-8799 PT Time Calculation (min) (ACUTE ONLY): 17 min  Charges:    $Therapeutic Activity: 8-22 mins PT General Charges $$ ACUTE PT VISIT: 1 Visit                    Darice Bohr, PTA  Darice JAYSON Bohr 11/02/2023, 3:08 PM

## 2023-11-03 DIAGNOSIS — R531 Weakness: Secondary | ICD-10-CM | POA: Diagnosis not present

## 2023-11-03 LAB — PHOSPHORUS: Phosphorus: 4.4 mg/dL (ref 2.5–4.6)

## 2023-11-03 LAB — CBC
HCT: 30.5 % — ABNORMAL LOW (ref 36.0–46.0)
Hemoglobin: 9.8 g/dL — ABNORMAL LOW (ref 12.0–15.0)
MCH: 28.3 pg (ref 26.0–34.0)
MCHC: 32.1 g/dL (ref 30.0–36.0)
MCV: 88.2 fL (ref 80.0–100.0)
Platelets: 416 K/uL — ABNORMAL HIGH (ref 150–400)
RBC: 3.46 MIL/uL — ABNORMAL LOW (ref 3.87–5.11)
RDW: 17 % — ABNORMAL HIGH (ref 11.5–15.5)
WBC: 21.6 K/uL — ABNORMAL HIGH (ref 4.0–10.5)
nRBC: 0.1 % (ref 0.0–0.2)

## 2023-11-03 LAB — MAGNESIUM: Magnesium: 2 mg/dL (ref 1.7–2.4)

## 2023-11-03 LAB — BASIC METABOLIC PANEL WITH GFR
Anion gap: 9 (ref 5–15)
BUN: 9 mg/dL (ref 8–23)
CO2: 23 mmol/L (ref 22–32)
Calcium: 8.9 mg/dL (ref 8.9–10.3)
Chloride: 111 mmol/L (ref 98–111)
Creatinine, Ser: 0.36 mg/dL — ABNORMAL LOW (ref 0.44–1.00)
GFR, Estimated: >60 mL/min (ref >60)
Glucose, Bld: 145 mg/dL — ABNORMAL HIGH (ref 70–99)
Potassium: 4.4 mmol/L (ref 3.5–5.1)
Sodium: 143 mmol/L (ref 135–145)

## 2023-11-03 NOTE — Progress Notes (Signed)
 Physical Therapy Treatment Patient Details Name: Sheila [name] MRN: [id number] DOB: 04/19/1960 Today's Date: 11/03/2023   History of Present Illness presented to ER secondary to progressive weakness (R > L) and multiple falls; admitted for management of generalized weakness, multiple electrolyte abnormalities    PT Comments  Pt received on [facility] for co-tx session with OT in order to safely progress mobility and balance. Pt continues to require assist for bed mobility, transfers and gait. Heavy reliance on RW with ataxia. Definite neuromuscular deficit involvement of unknown etiology almost presenting like MS. Per MD note, pt to f/u with Neurology Consult as out-pt. Will continue to progress as able acutely. Currently awaiting STR insurance approval.   If plan is discharge home, recommend the following: A lot of help with walking and/or transfers;A lot of help with bathing/dressing/bathroom;Assistance with cooking/housework;Direct supervision/assist for medications management;Direct supervision/assist for financial management;Assist for transportation;Help with stairs or ramp for entrance;Supervision due to cognitive status   Can travel by private vehicle     No  Equipment Recommendations  None recommended by PT    Recommendations for Other Services       Precautions / Restrictions Precautions Precautions: Fall Recall of Precautions/Restrictions: Intact Restrictions Weight Bearing Restrictions Per Provider Order: No     Mobility  Bed Mobility               General bed mobility comments:  (Pt received in chair pre/post session)    Transfers Overall transfer level: Needs assistance Equipment used: Rolling walker (2 wheels) Transfers: Sit to/from Stand Sit to Stand: Contact guard assist, Min assist           General transfer comment: Pt remains ataxic, decreased proprioception and motor control B LE's, Hyperextension at B knees to compensate for weakness during  stance phase. Poor eccentric control during stand to sit +2 for safety, high fall risk    Ambulation/Gait Ambulation/Gait assistance: Min assist, Contact guard assist Gait Distance (Feet):  (22) Assistive device: Rolling walker (2 wheels) Gait Pattern/deviations: Step-through pattern, Decreased stride length, Decreased dorsiflexion - right, Decreased dorsiflexion - left, Knee hyperextension - right, Knee hyperextension - left, Ataxic, Narrow base of support Gait velocity: decr     General Gait Details: Pt remains ataxic, high fall risk, poor awareness of foot placement in space and over compensating to prevent B knee buckling   Stairs             Wheelchair Mobility     Tilt Bed    Modified Rankin (Stroke Patients Only)       Balance Overall balance assessment: Needs assistance Sitting-balance support: No upper extremity supported, Feet supported Sitting balance-Leahy Scale: Fair Sitting balance - Comments: maintains statically with close sup   Standing balance support: Bilateral upper extremity supported, During functional activity, Reliant on assistive device for balance Standing balance-Leahy Scale: Poor Standing balance comment:  (High fall risk)             High level balance activites: Side stepping, Direction changes, Backward walking High Level Balance Comments:  (MinA to prevent Posterior LOB when stepping back with RW)            Communication Communication Communication: No apparent difficulties  Cognition Arousal: Alert Behavior During Therapy: Flat affect   PT - Cognitive impairments: No family/caregiver present to determine baseline, Awareness, Memory, Sequencing, Problem solving, Safety/Judgement  Following commands: Impaired Following commands impaired: Follows one step commands with increased time    Cueing Cueing Techniques: Verbal cues, Visual cues  Exercises      General Comments         Pertinent Vitals/Pain Pain Assessment Pain Assessment: Faces Faces Pain Scale: Hurts a little bit Pain Location: Right hand Pain Descriptors / Indicators: Aching Pain Intervention(s): Monitored during session    Home Living                          Prior Function            PT Goals (current goals can now be found in the care plan section) Acute Rehab PT Goals Patient Stated Goal: to return home Progress towards PT goals: Progressing toward goals    Frequency    Min 3X/week      PT Plan      Co-evaluation PT/OT/SLP Co-Evaluation/Treatment: Yes Reason for Co-Treatment: For patient/therapist safety;Necessary to address cognition/behavior during functional activity;To address functional/ADL transfers;Complexity of the patient's impairments (multi-system involvement) PT goals addressed during session: Mobility/safety with mobility;Balance;Proper use of DME OT goals addressed during session: ADL's and self-care      AM-PAC PT 6 Clicks Mobility   Outcome Measure  Help needed turning from your back to your side while in a flat bed without using bedrails?: A Little Help needed moving from lying on your back to sitting on the side of a flat bed without using bedrails?: A Little Help needed moving to and from a bed to a chair (including a wheelchair)?: A Little Help needed standing up from a chair using your arms (e.g., wheelchair or bedside chair)?: A Little Help needed to walk in hospital room?: A Lot Help needed climbing 3-5 steps with a railing? : A Lot 6 Click Score: 16    End of Session Equipment Utilized During Treatment: Gait belt Activity Tolerance: Patient tolerated treatment well Patient left: in chair;with call bell/phone within reach;with chair alarm set Nurse Communication: Mobility status PT Visit Diagnosis: Muscle weakness (generalized) (M62.81);Difficulty in walking, not elsewhere classified (R26.2);Other symptoms and signs involving the  nervous system (R29.898)     Time: 1133-1202 PT Time Calculation (min) (ACUTE ONLY): 29 min  Charges:    $Neuromuscular Re-education: 8-22 mins PT General Charges $$ ACUTE PT VISIT: 1 Visit                    Darice Bohr, PTA  Darice JAYSON Bohr 11/03/2023, 1:46 PM

## 2023-11-03 NOTE — Plan of Care (Signed)

## 2023-11-03 NOTE — Plan of Care (Signed)

## 2023-11-03 NOTE — Progress Notes (Signed)
 PROGRESS NOTE    Sheila [name]  FMW:[id number] DOB: 01-Nov-1960 DOA: 10/26/2023 PCP: Ricard Tawni KIDD, MD   Assessment & Plan:   Principal Problem:   Weakness  Assessment and Plan: Right-sided weakness: w/ frequent falls & ataxia. Associated w/ numbness. CT head showed no acute intracranial abnormality. Weakness also changes from side to side. MRI spine, which has only some lumbar findings which do not seem severe enough to cause pt's deficits. PT recs SNF.  Memory deficit: as per pt and pt's daughter. Recommend pt see neuro outpatient. Pt's daughter verbalized her understanding   Hypomagnesemia: acute on chronic. Continue on Mg supplement    Hypokalemia: WNL today.    Hypophosphatemia: WNL today    HTN: BP is currently WNL   Hx of ulcerative colitis: continue on home dose of mesalamine . F/u outpatient w/ GI   Chronic leukocytosis: no source of infection so far. Holding abx. Will need to f/u outpatient w/ heme  Chest pain: atypical. Rapid call for chest pain on 10/26.  Pain located in epigastric region.  Trop neg.  EKG neg for ACS-related changes. Resolved       DVT prophylaxis: lovenox  Code Status: full  Family Communication: discussed pt's care w/ pt's daughter, Juanita, and answered her questions Disposition Plan: waiting on SNF placement   Level of care: Med-Surg  Status is: Inpatient Remains inpatient appropriate because: waiting on insurance auth as per CM      Consultants:    Procedures:  Antimicrobials:   Subjective: Pt c/o fatigue   Objective: Vitals:   11/02/23 0732 11/02/23 1941 11/03/23 0452 11/03/23 0750  BP: 128/86 (!) 127/90 117/85 (!) 159/146  Pulse: 95 95 91 100  Resp: 18 18 18 17   Temp: 98 F (36.7 C) 98.4 F (36.9 C) 98.1 F (36.7 C) 97.8 F (36.6 C)  TempSrc: Oral Oral Oral   SpO2: 100% 99% 99% 96%  Weight: 59.2 kg     Height:        Intake/Output Summary (Last 24 hours) at 11/03/2023 0816 Last data filed at  11/02/2023 1756 Gross per 24 hour  Intake 480 ml  Output --  Net 480 ml   Filed Weights   10/26/23 1145 11/02/23 0732  Weight: 59.4 kg 59.2 kg    Examination:  General exam: appears comfortable Respiratory system: clear breath sounds b/l  Cardiovascular system: S1/S2+. No rubs or clicks Gastrointestinal system: Abd is soft, NT, ND & hypoactive bowel sounds Central nervous system:  alert & awake. Moves all extremities Psychiatry: Judgement and insight appears at baseline. Flat mood and affect    Data Reviewed: I have personally reviewed following labs and imaging studies  CBC: Recent Labs  Lab 10/28/23 0543 10/29/23 0439 10/30/23 0521 11/03/23 0424  WBC 20.3* 21.3* 20.8* 21.6*  HGB 9.1* 8.9* 9.0* 9.8*  HCT 27.5* 27.0* 27.3* 30.5*  MCV 86.8 86.3 85.6 88.2  PLT 370 355 370 416*   Basic Metabolic Panel: Recent Labs  Lab 10/28/23 0543 10/28/23 0543 10/29/23 0439 10/30/23 0521 10/31/23 0510 11/01/23 1132 11/02/23 0411 11/03/23 0424  NA 141  --  139 141 140  --   --  143  K 3.0*  --  3.3* 3.4* 3.9 4.8 4.2 4.4  CL 106  --  103 105 106  --   --  111  CO2 26  --  26 26 25   --   --  23  GLUCOSE 159*  --  144* 130* 130*  --   --  145*  BUN 8  --  6* <5* 6*  --   --  9  CREATININE 0.33*  --  0.54 0.38* 0.34*  --   --  0.36*  CALCIUM  8.3*  --  8.7* 7.9* 8.4*  --   --  8.9  MG 1.3*  --  1.7 1.6* 2.0 1.9 1.8 2.0  PHOS  --    < > 2.0* 5.4* 3.3 3.6 4.0 4.4   < > = values in this interval not displayed.   GFR: Estimated Creatinine Clearance: 58.7 mL/min (A) (by C-G formula based on SCr of 0.36 mg/dL (L)). Liver Function Tests: No results for input(s): AST, ALT, ALKPHOS, BILITOT, PROT, ALBUMIN  in the last 168 hours.  No results for input(s): LIPASE, AMYLASE in the last 168 hours. No results for input(s): AMMONIA in the last 168 hours. Coagulation Profile: No results for input(s): INR, PROTIME in the last 168 hours. Cardiac Enzymes: No results  for input(s): CKTOTAL, CKMB, CKMBINDEX, TROPONINI in the last 168 hours. BNP (last 3 results) No results for input(s): PROBNP in the last 8760 hours. HbA1C: No results for input(s): HGBA1C in the last 72 hours. CBG: Recent Labs  Lab 10/30/23 1110  GLUCAP 150*   Lipid Profile: No results for input(s): CHOL, HDL, LDLCALC, TRIG, CHOLHDL, LDLDIRECT in the last 72 hours. Thyroid  Function Tests: No results for input(s): TSH, T4TOTAL, FREET4, T3FREE, THYROIDAB in the last 72 hours. Anemia Panel: No results for input(s): VITAMINB12, FOLATE, FERRITIN, TIBC, IRON, RETICCTPCT in the last 72 hours. Sepsis Labs: No results for input(s): PROCALCITON, LATICACIDVEN in the last 168 hours.   Recent Results (from the past 240 hours)  Culture, blood (routine x 2)     Status: None   Collection Time: 10/26/23  1:27 PM   Specimen: BLOOD  Result Value Ref Range Status   Specimen Description BLOOD BLOOD RIGHT ARM  Final   Special Requests BOTTLES DRAWN AEROBIC AND ANAEROBIC BCLV  Final   Culture   Final    NO GROWTH 5 DAYS Performed at [facility], [address] [place], [place] [address]    Report Status 10/31/2023 FINAL  Final  Culture, blood (routine x 2)     Status: None   Collection Time: 10/26/23  1:27 PM   Specimen: BLOOD  Result Value Ref Range Status   Specimen Description BLOOD BLOOD LEFT ARM  Final   Special Requests BOTTLES DRAWN AEROBIC AND ANAEROBIC BCLV  Final   Culture   Final    NO GROWTH 5 DAYS Performed at [facility], [address] [place], [place] [address]    Report Status 10/31/2023 FINAL  Final         Radiology Studies: No results found.       Scheduled Meds:  enoxaparin  (LOVENOX ) injection  40 mg Subcutaneous Q24H   magnesium  oxide  800 mg Oral BID   mesalamine   2.4 g Oral Q breakfast   potassium chloride   40 mEq Oral BID   Continuous Infusions:   LOS: 6 days        Anthony CHRISTELLA Pouch, MD [facility] Pager [phone]  If 7PM-7AM, please contact night-coverage [email or web address] 11/03/2023, 8:16 AM

## 2023-11-03 NOTE — Progress Notes (Signed)
 Occupational Therapy Treatment Patient Details Name: Sheila [name] MRN: [id number] DOB: 02-19-1960 Today's Date: 11/03/2023   History of present illness presented to ER secondary to progressive weakness (R > L) and multiple falls; admitted for management of generalized weakness, multiple electrolyte abnormalities   OT comments  Upon entering the room, pt seated on [facility] and calling for assistance. Pt seen for skilled co-treatment with PT. Pt is oriented to self and location. Pt stands from [facility] with RW and min A but needing assistance for hygiene and clothing management. Pt ambulates with RW and min A 30' to sink for grooming tasks. Pt needing increased time and min A for standing balance for B hand coordination tasks to apply toothpaste and brush teeth this session. Pt then returns to sitting on EOB to rest. Pt needing mod multimodal cues for hand placement and technique to use RW for mobility. Pt returning to sit in recliner chair at end of session with call bell and all needed items within reach upon exiting the room.       If plan is discharge home, recommend the following:  A lot of help with walking and/or transfers;A lot of help with bathing/dressing/bathroom;Assistance with feeding;Assistance with cooking/housework;Assist for transportation;Supervision due to cognitive status;Direct supervision/assist for financial management;Direct supervision/assist for medications management;Help with stairs or ramp for entrance   Equipment Recommendations  None recommended by OT (defer)       Precautions / Restrictions Precautions Precautions: Fall Recall of Precautions/Restrictions: Intact Restrictions Weight Bearing Restrictions Per Provider Order: No       Mobility Bed Mobility                    Transfers Overall transfer level: Needs assistance Equipment used: Rolling walker (2 wheels) Transfers: Sit to/from Stand Sit to Stand: Min assist, +2 safety/equipment            General transfer comment: Pt remains ataxic, decreased proprioception and motor control B LE's, Hyperextension at B knees to compensate for weakness during stance phase. Poor eccentric control during stand to sit +2 for safety, high fall risk     Balance Overall balance assessment: Needs assistance Sitting-balance support: No upper extremity supported, Feet supported Sitting balance-Leahy Scale: Fair   Postural control: Posterior lean Standing balance support: Bilateral upper extremity supported, During functional activity, Reliant on assistive device for balance Standing balance-Leahy Scale: Poor                             ADL either performed or assessed with clinical judgement   ADL Overall ADL's : Needs assistance/impaired     Grooming: Oral care;Standing;Minimal assistance                   Toilet Transfer: Minimal assistance;BSC/3in1;+2 for physical assistance;+2 for safety/equipment;Rolling walker (2 wheels)                  Extremity/Trunk Assessment Upper Extremity Assessment Upper Extremity Assessment: Generalized weakness   Lower Extremity Assessment Lower Extremity Assessment: Generalized weakness        Vision Patient Visual Report: No change from baseline     Perception     Praxis     Communication Communication Communication: No apparent difficulties   Cognition Arousal: Alert Behavior During Therapy: Flat affect Cognition: Cognition impaired   Orientation impairments: Time, Situation Awareness: Intellectual awareness impaired, Online awareness impaired Memory impairment (select all impairments): Short-term memory, Working memory Attention impairment (  select first level of impairment): Sustained attention Executive functioning impairment (select all impairments): Sequencing, Reasoning, Problem solving                   Following commands: Impaired Following commands impaired: Follows one step commands with  increased time      Cueing   Cueing Techniques: Verbal cues, Visual cues             Pertinent Vitals/ Pain       Pain Assessment Pain Assessment: Faces Faces Pain Scale: Hurts a little bit Pain Location: Right hand Pain Descriptors / Indicators: Aching Pain Intervention(s): Monitored during session         Frequency  Min 2X/week        Progress Toward Goals  OT Goals(current goals can now be found in the care plan section)  Progress towards OT goals: Progressing toward goals         Co-evaluation    PT/OT/SLP Co-Evaluation/Treatment: Yes Reason for Co-Treatment: For patient/therapist safety;Necessary to address cognition/behavior during functional activity;To address functional/ADL transfers;Complexity of the patient's impairments (multi-system involvement) PT goals addressed during session: Mobility/safety with mobility;Balance;Proper use of DME OT goals addressed during session: ADL's and self-care      AM-PAC OT 6 Clicks Daily Activity     Outcome Measure   Help from another person eating meals?: A Little Help from another person taking care of personal grooming?: A Little Help from another person toileting, which includes using toliet, bedpan, or urinal?: A Lot Help from another person bathing (including washing, rinsing, drying)?: A Lot Help from another person to put on and taking off regular upper body clothing?: A Lot Help from another person to put on and taking off regular lower body clothing?: A Lot 6 Click Score: 14    End of Session Equipment Utilized During Treatment: Rolling walker (2 wheels)  OT Visit Diagnosis: Unsteadiness on feet (R26.81);Repeated falls (R29.6);Muscle weakness (generalized) (M62.81)   Activity Tolerance Patient tolerated treatment well   Patient Left with call bell/phone within reach;in chair;with chair alarm set   Nurse Communication Mobility status        Time: 8866-8797 OT Time Calculation (min): 29  min  Charges: OT General Charges $OT Visit: 1 Visit OT Treatments $Self Care/Home Management : 8-22 mins  Izetta Claude, MS, OTR/L , CBIS ascom [phone]  11/03/23, 3:06 PM

## 2023-11-04 DIAGNOSIS — R531 Weakness: Secondary | ICD-10-CM | POA: Diagnosis not present

## 2023-11-04 LAB — BASIC METABOLIC PANEL WITH GFR
Anion gap: 13 (ref 5–15)
BUN: 11 mg/dL (ref 8–23)
CO2: 23 mmol/L (ref 22–32)
Calcium: 9 mg/dL (ref 8.9–10.3)
Chloride: 110 mmol/L (ref 98–111)
Creatinine, Ser: <0.3 mg/dL — ABNORMAL LOW (ref 0.44–1.00)
Glucose, Bld: 124 mg/dL — ABNORMAL HIGH (ref 70–99)
Potassium: 4.3 mmol/L (ref 3.5–5.1)
Sodium: 146 mmol/L — ABNORMAL HIGH (ref 135–145)

## 2023-11-04 LAB — CBC
HCT: 33.3 % — ABNORMAL LOW (ref 36.0–46.0)
Hemoglobin: 10.3 g/dL — ABNORMAL LOW (ref 12.0–15.0)
MCH: 27.7 pg (ref 26.0–34.0)
MCHC: 30.9 g/dL (ref 30.0–36.0)
MCV: 89.5 fL (ref 80.0–100.0)
Platelets: 441 K/uL — ABNORMAL HIGH (ref 150–400)
RBC: 3.72 MIL/uL — ABNORMAL LOW (ref 3.87–5.11)
RDW: 17.2 % — ABNORMAL HIGH (ref 11.5–15.5)
WBC: 18.3 K/uL — ABNORMAL HIGH (ref 4.0–10.5)
nRBC: 0 % (ref 0.0–0.2)

## 2023-11-04 LAB — MAGNESIUM: Magnesium: 1.9 mg/dL (ref 1.7–2.4)

## 2023-11-04 LAB — SODIUM: Sodium: 142 mmol/L (ref 135–145)

## 2023-11-04 LAB — PHOSPHORUS: Phosphorus: 4.6 mg/dL (ref 2.5–4.6)

## 2023-11-04 MED ORDER — DEXTROSE 5 % IV SOLN: INTRAVENOUS | Status: DC

## 2023-11-04 NOTE — Progress Notes (Signed)
 Physical Therapy Treatment Patient Details Name: Sheila [name] MRN: [id number] DOB: 1960/02/05 Today's Date: 11/04/2023   History of Present Illness presented to ER secondary to progressive weakness (R > L) and multiple falls; admitted for management of generalized weakness, multiple electrolyte abnormalities    PT Comments  Pt received up in chair. 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 This continues to be present today during transfers and gait training causing pt to remain at risk for future falls. Pt stated to this therapist today that her son was diagnosed with [facility], which pt presents with very similar symptoms. MD notified. Will continue to progress per POC. Pt currently awaiting STR placement.    If plan is discharge home, recommend the following: A lot of help with walking and/or transfers;A lot of help with bathing/dressing/bathroom;Assistance with cooking/housework;Direct supervision/assist for medications management;Direct supervision/assist for financial management;Assist for transportation;Help with stairs or ramp for entrance;Supervision due to cognitive status   Can travel by private vehicle     No  Equipment Recommendations  None recommended by PT    Recommendations for Other Services       Precautions / Restrictions Precautions Precautions: Fall Recall of Precautions/Restrictions: Intact Restrictions Weight Bearing Restrictions Per Provider Order: No     Mobility  Bed Mobility Overal bed mobility: Needs Assistance Bed Mobility: Sit to Supine     Supine to sit: Min assist, HOB elevated, Used rails          Transfers Overall transfer level: Needs assistance Equipment used: Rolling walker (2 wheels) Transfers: Sit to/from Stand Sit to Stand: Min assist           General transfer comment: Pt remains  ataxic, decreased proprioception and motor control B LE's, Hyperextension at B knees to compensate for weakness during stance phase. Poor eccentric control during stand to sit +2 for safety, high fall risk    Ambulation/Gait Ambulation/Gait assistance: Min assist, Contact guard assist Gait Distance (Feet):  (45) Assistive device: Rolling walker (2 wheels) Gait Pattern/deviations: Step-through pattern, Decreased stride length, Decreased dorsiflexion - right, Decreased dorsiflexion - left, Knee hyperextension - right, Knee hyperextension - left, Ataxic, Narrow base of support Gait velocity: decr     General Gait Details: Pt remains ataxic, high fall risk, poor awareness of foot placement in space and over compensating to prevent B knee buckling   Stairs             Wheelchair Mobility     Tilt Bed    Modified Rankin (Stroke Patients Only)       Balance Overall balance assessment: Needs assistance Sitting-balance support: No upper extremity supported, Feet supported Sitting balance-Leahy Scale: Fair Sitting balance - Comments: maintains statically with close sup   Standing balance support: Bilateral upper extremity supported, During functional activity, Reliant on assistive device for balance Standing balance-Leahy Scale: Poor                              Communication Communication Communication: No apparent difficulties  Cognition Arousal: Alert Behavior During Therapy: WFL for tasks assessed/performed                           PT - Cognition Comments:  (A&Ox 4) Following commands: Impaired Following commands impaired: Follows one step  commands with increased time    Cueing Cueing Techniques: Verbal cues, Visual cues  Exercises      General Comments        Pertinent Vitals/Pain Pain Assessment Pain Assessment: No/denies pain    Home Living                          Prior Function            PT Goals (current goals  can now be found in the care plan section) Acute Rehab PT Goals Patient Stated Goal: to return home Progress towards PT goals: Progressing toward goals    Frequency    Min 3X/week      PT Plan      Co-evaluation              AM-PAC PT 6 Clicks Mobility   Outcome Measure  Help needed turning from your back to your side while in a flat bed without using bedrails?: A Little Help needed moving from lying on your back to sitting on the side of a flat bed without using bedrails?: A Little Help needed moving to and from a bed to a chair (including a wheelchair)?: A Little Help needed standing up from a chair using your arms (e.g., wheelchair or bedside chair)?: A Little Help needed to walk in hospital room?: A Lot Help needed climbing 3-5 steps with a railing? : A Lot 6 Click Score: 16    End of Session Equipment Utilized During Treatment: Gait belt Activity Tolerance: Patient tolerated treatment well Patient left: in chair;with call bell/phone within reach;with chair alarm set Nurse Communication: Mobility status PT Visit Diagnosis: Muscle weakness (generalized) (M62.81);Difficulty in walking, not elsewhere classified (R26.2);Other symptoms and signs involving the nervous system (R29.898)     Time: 8879-8855 PT Time Calculation (min) (ACUTE ONLY): 24 min  Charges:    $Gait Training: 8-22 mins $Therapeutic Activity: 8-22 mins PT General Charges $$ ACUTE PT VISIT: 1 Visit                    Darice Bohr, PTA  Darice JAYSON Bohr 11/04/2023, 2:58 PM

## 2023-11-04 NOTE — TOC Transition Note (Signed)
 Transition of Care [facility]) - Discharge Note   Patient Details  Name: Sheila [name] MRN: [id number] Date of Birth: [birth year]  [facility] CM/SW Contact:  Alvaro Louder, LCSW Phone Number: 11/04/2023, 2:04 PM   Clinical Narrative:     LCSWA received insurance approval for patient to admit to SNF [facility]. LCSWA confirmed with MD that patient is stable for discharge. LCSWA notified the patient and they are in agreement with discharge. LCSWA confirmed bed is available at SNF. Transport arranged with [facility] for next available.  RM [id number], [phone]   TOC signing off       Patient Goals and CMS Choice            Discharge Placement                       Discharge Plan and Services Additional resources added to the After Visit Summary for                                       Social Drivers of Health (SDOH) Interventions SDOH Screenings   Food Insecurity: No Food Insecurity (10/27/2023)  Housing: Low Risk  (10/27/2023)  Transportation Needs: Unmet Transportation Needs (10/27/2023)  Utilities: Not At Risk (10/27/2023)  Social Connections: Unknown (04/30/2023)  Tobacco Use: Medium Risk (10/26/2023)     Readmission Risk Interventions     No data to display

## 2023-11-04 NOTE — Discharge Summary (Signed)
 Physician Discharge Summary  PRICELLA GAUGH FMW:[id number] DOB: [birth year] DOA: 10/26/2023  PCP: Ricard Tawni KIDD, MD  Admit date: 10/26/2023 Discharge date: 11/04/2023  Admitted From: home  Disposition:  SNF  Recommendations for Outpatient Follow-up:  Follow up with PCP in 1-2 weeks F/u w/ neurology outpatient in 1-3 weeks F/u w/ heme outpatient in 1-2 weeks   Home Health: no  Equipment/Devices:  Discharge Condition: stable  CODE STATUS: full  Diet recommendation:  Regular  Brief/Interim Summary: HPI was taken from Dr. Awanda: Elenor JONELLE Lady is a 63 y.o. female with medical history significant of ulcerative colitis, HTN, peripheral neuropathy, history of alcohol use disorder, chronic hypokalemia and hypomag who presented with weakness and falls.   Pt reported weakness and falling a lot for at least 2 weeks.  Weakness is mostly on her right side.  Pt used to walk with walker or cane, but now needs a wheelchair.  Pt reported associated numbness on her right arm and leg.  Pt reported diarrhea which is chronic, 3-4 episodes per day for months.  No other complaints.  No fever, dyspnea, chest pain, abdominal pain, dysuria.   ED Course: afebrile, pulse 96, BP 112/64, RR 18, sating 100% on room air.  Labs notable for alk phos 229, WBC 27.5, procal 3.16.  CXR and CT head no acute finding.  ED provider ordered aztreonam, flagyl  and vanc for empiric coverage and requested admission.  Discharge Diagnoses:  Principal Problem:   Weakness  Right-sided weakness: w/ frequent falls & ataxia. Associated w/ numbness. CT head showed no acute intracranial abnormality. Weakness also changes from side to side. MRI spine, which has only some lumbar findings which do not seem severe enough to cause pt's deficits. PT recs SNF.  Memory deficit: as per pt and pt's daughter. Recommend pt see neuro outpatient. Pt's daughter verbalized her understanding   Hypomagnesemia: acute on chronic. Continue on Mg  supplement   Hypernatremia: started on D5W. Repeat Na WNL. Resolved   Hypokalemia: WNL today.    Hypophosphatemia: WNL today    HTN: BP is currently WNL   Hx of ulcerative colitis: continue on home dose of mesalamine . F/u outpatient w/ GI   Chronic leukocytosis: no source of infection so far. Holding abx. Will need to f/u outpatient w/ heme  Chest pain: atypical. Rapid call for chest pain on 10/26.  Pain located in epigastric region.  Trop neg.  EKG neg for ACS-related changes. Resolved  Discharge Instructions  Discharge Instructions     Diet general   Complete by: As directed    Discharge instructions   Complete by: As directed    F/u w/ PCP in 1-2 weeks. F/u w/ neuro in 1-3 weeks to evaluate for dementia. F/u w/ heme in 1-2 weeks to evaluate chronically elevated WBCs.   Increase activity slowly   Complete by: As directed    No wound care   Complete by: As directed       Allergies as of 11/04/2023       Reactions   Ampicillin Anaphylaxis   Erythromycin Anaphylaxis   Omeprazole Anaphylaxis   Penicillins Anaphylaxis, Other (See Comments)   Has patient had a PCN reaction causing immediate rash, facial/tongue/throat swelling, SOB or lightheadedness with hypotension: Yes Has patient had a PCN reaction causing severe rash involving mucus membranes or skin necrosis: No Has patient had a PCN reaction that required hospitalization: Unknown Has patient had a PCN reaction occurring within the last 10 years: Yes If all of the  above answers are NO, then may proceed with Cephalosporin use.   Triamterene-hctz Shortness Of Breath, Swelling   Quinapril Cough        Medication List     TAKE these medications    acetaminophen  500 MG tablet Commonly known as: TYLENOL  Take 1 tablet (500 mg total) by mouth every 12 (twelve) hours.   allopurinol  100 MG tablet Commonly known as: ZYLOPRIM  Take 100 mg by mouth daily.   ascorbic acid  500 MG tablet Commonly known as: VITAMIN  C Take 1 tablet (500 mg total) by mouth daily.   chlorthalidone  25 MG tablet Commonly known as: HYGROTON  Take 25 mg by mouth daily.   [employer] VITAMIN B12 1000 MCG tablet Generic drug: cyanocobalamin  Take 1,000 mcg by mouth daily.   folic acid  1 MG tablet Commonly known as: FOLVITE  Take 1 tablet (1 mg total) by mouth daily.   gabapentin  300 MG capsule Commonly known as: NEURONTIN  Take 1 capsule (300 mg total) by mouth 2 (two) times daily.   Klor-Con  M20 20 MEQ tablet Generic drug: potassium chloride  SA Take 2 tablets (40 mEq total) by mouth daily.   latanoprost  0.005 % ophthalmic solution Commonly known as: XALATAN  Place 1 drop into both eyes at bedtime.   losartan  25 MG tablet Commonly known as: COZAAR  Take 25 mg by mouth daily.   mesalamine  1.2 g EC tablet Commonly known as: LIALDA  Take 2.4 g by mouth daily with breakfast.   metFORMIN  500 MG 24 hr tablet Commonly known as: GLUCOPHAGE -XR Take 500 mg by mouth 2 (two) times daily.   metoprolol  succinate 50 MG 24 hr tablet Commonly known as: TOPROL -XL Take 50 mg by mouth at bedtime.   pantoprazole  40 MG tablet Commonly known as: PROTONIX  Take 40 mg by mouth every morning.   pravastatin  10 MG tablet Commonly known as: PRAVACHOL  Take 10 mg by mouth every morning.   sucralfate  1 g tablet Commonly known as: CARAFATE  Take 1 g by mouth 2 (two) times daily.   thiamine  100 MG tablet Commonly known as: VITAMIN B1 Take 1 tablet (100 mg total) by mouth daily.   Vitamin D -1000 Max St 25 MCG (1000 UT) tablet Generic drug: Cholecalciferol Take 1,000 Units by mouth daily.        Contact information for after-discharge care     Destination     [facility] .   Service: Skilled Nursing Contact information: [address] [place]   [address] [phone]                    Allergies  Allergen Reactions   Ampicillin Anaphylaxis   Erythromycin Anaphylaxis   Omeprazole  Anaphylaxis   Penicillins Anaphylaxis and Other (See Comments)    Has patient had a PCN reaction causing immediate rash, facial/tongue/throat swelling, SOB or lightheadedness with hypotension: Yes Has patient had a PCN reaction causing severe rash involving mucus membranes or skin necrosis: No Has patient had a PCN reaction that required hospitalization: Unknown Has patient had a PCN reaction occurring within the last 10 years: Yes If all of the above answers are NO, then may proceed with Cephalosporin use.    Triamterene-Hctz Shortness Of Breath and Swelling   Quinapril Cough    Consultations:    Procedures/Studies: MR Lumbar Spine W Wo Contrast Result Date: 10/31/2023 EXAM: MR Lumbar Spine With and Without Intravenous Contrast. 10/31/2023 02:58:34 PM TECHNIQUE: Multiplanar multisequence MRI of the lumbar spine was performed with and without the administration of intravenous contrast. COMPARISON: None  available CLINICAL HISTORY: ataxia. 5ml gadavist . KACEE SUKHU is a 63 y.o. female with medical history significant of ulcerative colitis, HTN, peripheral neuropathy, history of alcohol use disorder, chronic hypokalemia and hypomag who presented with right side weakness and falls for ; weeks. FINDINGS: BONES AND ALIGNMENT: Normal vertebral body heights. Decreased T1 signal intensity within the visualized bone marrow, nonspecific, but most commonly related to anemia, smoking, or VCD. No abnormal enhancement. Normal alignment. SPINAL CORD: The conus medullaris terminates at the level of L1. SOFT TISSUES: No acute abnormality. Subcentimeter simple right renal cyst noted, benign in appearance, no follow up imaging recommended. L1-L2: Mild disc bulge with reactive endplate spurring. Mild bilateral facet hypertrophy. No significant stenosis. L2-L3: Mild disc bulge. Mild bilateral facet hypertrophy. No significant stenosis. L3-L4: Mild disc bulge. Superimposed small left foraminal disc protrusion  with annular fissure closely approximates the left L3 nerve root. Mild left greater than right facet hypertrophy. No spinal stenosis. Mild bilateral L3 foraminal narrowing. L4-L5: Mild disc bulge with reactive endplate spurring. Mild to moderate bilateral facet hypertrophy. Suspected subtle 3 to 4 mm synovial cyst at the medial aspect of the right L4-L5 facet. Resultant mild canal with mild to moderate right lateral recess stenosis. Mild bilateral L4 foraminal narrowing. L5-S1: Mild intervertebral disc space narrowing with disc desiccation and diffuse disc bulge. Reactive endplate spurring. Superimposed central disc protrusion indents the ventral thecal sac, slightly asymmetric to the right. Mild to moderate right worse than left facet hypertrophy. Resultant mild canal with moderate bilateral subarticular stenosis. Moderate bilateral L5 foraminal narrowing. IMPRESSION: 1. No acute abnormality within the lumbar spine. 2. Small left foraminal disc protrusion at L3-4 potentially affecting the exiting left L3 nerve root. 3. Multifactorial degenerative changes at L4-5 with resultant mild canal with mild to moderate right greater than left lateral recess stenosis as above. 4. Degenerative disc disease with facet hypertrophy at L5-S1 with resultant moderate bilateral subarticular stenosis, with moderate bilateral L5 foraminal narrowing. Electronically signed by: Morene Hoard MD 10/31/2023 07:12 PM EDT RP Workstation: [id number]   MR THORACIC SPINE W WO CONTRAST Result Date: 10/31/2023 EXAM: MRI THORACIC SPINE WITH AND WITHOUT INTRAVENOUS CONTRAST 10/31/2023 02:58:34 PM TECHNIQUE: Multiplanar multisequence MRI of the thoracic spine was performed with and without the administration of 5 mL gadobutrol  (GADAVIST ) 1 MMOL/ML injection. COMPARISON: None available. CLINICAL HISTORY: ataxia. 5ml gadavist . KONNIE NOFFSINGER is a 63 y.o. female with medical history significant of ulcerative colitis, HTN, peripheral  neuropathy, history of alcohol use disorder, chronic hypokalemia and hypomag who presented with right side weakness and falls for ; weeks. FINDINGS: BONES AND ALIGNMENT: Normal alignment. Normal vertebral body heights. Mildly decreased T1 signal intensity within the visualized bone marrow, nonspecific, but most commonly related to anemia, smoking, or obesity. No abnormal enhancement. SPINAL CORD: Normal spinal cord volume. Normal spinal cord signal. SOFT TISSUES: Unremarkable. DEGENERATIVE CHANGES: Mild for age multilevel disc desiccation with noncompressive disc bulging seen throughout the mid Thoracic spine. No significant focal disc herniation. No significant facet disease. No canal or neural foraminal stenosis or evidence for neural impingement. VISUALIZED PORTIONS OF THE LIVER: 1 cm densely calcified lesion at the posterior aspect of the Right Hepatic lobe noted, nonspecific, but most likely benign. IMPRESSION: 1. No acute abnormality within the thoracic spine or spinal cord. No findings to explain patient symptoms. 2. Mild for age thoracic spondylosis without significant stenosis or overt neural impingement. Electronically signed by: Morene Hoard MD 10/31/2023 07:03 PM EDT RP Workstation: [id number]   MR CERVICAL SPINE  W WO CONTRAST Result Date: 10/31/2023 EXAM: MRI CERVICAL SPINE WITH AND WITHOUT CONTRAST 10/31/2023 02:58:34 PM TECHNIQUE: Multiplanar multisequence MRI of the cervical spine was performed with and without intravenous contrast. COMPARISON: None available. CLINICAL HISTORY: ataxia. 5ml gadavist . RAIDYN BREINER is a 63 y.o. female with medical history significant of ulcerative colitis, HTN, peripheral neuropathy, history of alcohol use disorder, chronic hypokalemia and hypomag who presented with right side weakness and falls for weeks. FINDINGS: BONES AND ALIGNMENT: Straightening of the normal cervical lordosis. Normal vertebral body heights. Decreased T1 signal intensity within the  visualized bone marrow, nonspecific, but most commonly related to anemia, smoking, or obesity. Mild for age multilevel disc desiccation seen throughout the cervical spine. SPINAL CORD: Normal spinal cord size. No abnormal spinal cord signal. SOFT TISSUES: No paraspinal mass. C2-C3: Mild disc desiccation. No significant disc bulge or focal disc herniation. No significant spinal stenosis. Foramina remain patent. No neural impingement. C3-C4: Mild disc desiccation. No significant disc bulge or focal disc herniation. No significant spinal stenosis. Foramina remain patent. No neural impingement. C4-C5: Mild disc desiccation. No significant disc bulge or focal disc herniation. No significant spinal stenosis. Foramina remain patent. No neural impingement. C5-C6: Mild disc desiccation. No significant disc bulge or focal disc herniation. No significant spinal stenosis. Foramina remain patent. No neural impingement. C6-C7: Mild disc desiccation. No significant disc bulge or focal disc herniation. No significant spinal stenosis. Foramina remain patent. No neural impingement. C7-T1: Mild disc desiccation. No significant disc bulge or focal disc herniation. No significant spinal stenosis. Foramina remain patent. No neural impingement. IMPRESSION: 1. No acute findings. 2. Minor for age cervical spondylosis without significant stenosis or abnormal impingement. Electronically signed by: Morene Hoard MD 10/31/2023 06:49 PM EDT RP Workstation: [id number]   DG Abd 1 View Result Date: 10/30/2023 EXAM: 1 VIEW XRAY OF THE ABDOMEN 10/30/2023 11:31:00 AM COMPARISON: None available. CLINICAL HISTORY: 63 y.o. female with medical history significant of ulcerative colitis, HTN, peripheral neuropathy, history of alcohol use disorder, chronic hypokalemia and hypomag who presented with weakness and falls for weeks. FINDINGS: BOWEL: Nonpathologically dilated air-filled loops of small bowel within the right lower quadrant of the abdomen.  SOFT TISSUES: Tubal ligation clips in pelvis noted. Coarse calcification in right pelvis, possibly calcified fibroid. No opaque urinary calculi. BONES: No acute osseous abnormality. IMPRESSION: 1. No bowel obstruction. Electronically signed by: Waddell Calk MD 10/30/2023 12:04 PM EDT RP Workstation: [id number]   CT Head Wo Contrast Result Date: 10/26/2023 EXAM: CT HEAD WITHOUT CONTRAST 10/26/2023 02:27:55 PM TECHNIQUE: CT of the head was performed without the administration of intravenous contrast. Automated exposure control, iterative reconstruction, and/or weight based adjustment of the mA/kV was utilized to reduce the radiation dose to as low as reasonably achievable. COMPARISON: 09/27/2023 CLINICAL HISTORY: Neuro deficit, acute, stroke suspected. Pt arrives via [facility] from [facility] with c/o dizziness x1 week and right sided numbness that has been happening since May and weakness that started 3-4 weeks ago. Pt states that they just didn't come to get checked out because they had a doctors ; appt. Pt is A\T\Ox4 during triage. FINDINGS: BRAIN AND VENTRICLES: No acute hemorrhage. No evidence of acute infarct. Periventricular white matter decreased attenuation consistent with small vessel ischemic changes. Prominent ventricles, sulci and cisterns consistent with age-related involutional changes. No extra-axial collection. No mass effect or midline shift. ORBITS: No acute abnormality. SINUSES: No acute abnormality. SOFT TISSUES AND SKULL: No acute soft tissue abnormality. No skull fracture. IMPRESSION: 1. No acute intracranial abnormality. 2.  Mild chronic small vessel ischemic changes and mild age-related cerebral volume loss. Electronically signed by: Donnice Mania MD 10/26/2023 02:47 PM EDT RP Workstation: [id number]   DG Chest 2 View Result Date: 10/26/2023 CLINICAL DATA:  Cough, elevated WBC EXAM: CHEST - 2 VIEW COMPARISON:  09/27/2023, 05/18/2023 FINDINGS: No focal airspace consolidation, pleural  effusion, or pneumothorax. No cardiomegaly. No acute fracture or destructive lesions. Multilevel thoracic osteophytosis. Remote, left clavicular and left rib fractures. IMPRESSION: No acute cardiopulmonary abnormality. Electronically Signed   By: Rogelia Myers M.D.   On: 10/26/2023 14:07   (Echo, Carotid, EGD, Colonoscopy, ERCP)    Subjective: Pt c/o fatigue   Discharge Exam: Vitals:   11/04/23 0510 11/04/23 0750  BP: (!) 140/78 (!) 145/81  Pulse: 85 91  Resp:  16  Temp:  98 F (36.7 C)  SpO2:  100%   Vitals:   11/03/23 1928 11/04/23 0404 11/04/23 0510 11/04/23 0750  BP: (!) 125/96 (!) 149/104 (!) 140/78 (!) 145/81  Pulse: 99 93 85 91  Resp: 17 18  16   Temp: 97.8 F (36.6 C) 98 F (36.7 C)  98 F (36.7 C)  TempSrc: Oral Oral  Oral  SpO2: 100% 100%  100%  Weight:      Height:        General: Pt is alert, awake, not in acute distress Cardiovascular: S1/S2 +, no rubs, no gallops Respiratory: CTA bilaterally, no wheezing, no rhonchi Abdominal: Soft, NT, ND, bowel sounds + Extremities: no cyanosis    The results of significant diagnostics from this hospitalization (including imaging, microbiology, ancillary and laboratory) are listed below for reference.     Microbiology: Recent Results (from the past 240 hours)  Culture, blood (routine x 2)     Status: None   Collection Time: 10/26/23  1:27 PM   Specimen: BLOOD  Result Value Ref Range Status   Specimen Description BLOOD BLOOD RIGHT ARM  Final   Special Requests BOTTLES DRAWN AEROBIC AND ANAEROBIC BCLV  Final   Culture   Final    NO GROWTH 5 DAYS Performed at [facility], [address], [place], [place] [address]    Report Status 10/31/2023 FINAL  Final  Culture, blood (routine x 2)     Status: None   Collection Time: 10/26/23  1:27 PM   Specimen: BLOOD  Result Value Ref Range Status   Specimen Description BLOOD BLOOD LEFT ARM  Final   Special Requests BOTTLES DRAWN AEROBIC AND ANAEROBIC BCLV   Final   Culture   Final    NO GROWTH 5 DAYS Performed at [facility], [address] [place], [place] [address]    Report Status 10/31/2023 FINAL  Final     Labs: BNP (last 3 results) No results for input(s): BNP in the last 8760 hours. Basic Metabolic Panel: Recent Labs  Lab 10/29/23 0439 10/30/23 0521 10/31/23 0510 11/01/23 1132 11/02/23 0411 11/03/23 0424 11/04/23 0419 11/04/23 1152  NA 139 141 140  --   --  143 146* 142  K 3.3* 3.4* 3.9 4.8 4.2 4.4 4.3  --   CL 103 105 106  --   --  111 110  --   CO2 26 26 25   --   --  23 23  --   GLUCOSE 144* 130* 130*  --   --  145* 124*  --   BUN 6* <5* 6*  --   --  9 11  --   CREATININE 0.54 0.38* 0.34*  --   --  0.36* <0.30*  --   CALCIUM  8.7* 7.9* 8.4*  --   --  8.9 9.0  --   MG 1.7 1.6* 2.0 1.9 1.8 2.0 1.9  --   PHOS 2.0* 5.4* 3.3 3.6 4.0 4.4 4.6  --    Liver Function Tests: No results for input(s): AST, ALT, ALKPHOS, BILITOT, PROT, ALBUMIN  in the last 168 hours. No results for input(s): LIPASE, AMYLASE in the last 168 hours. No results for input(s): AMMONIA in the last 168 hours. CBC: Recent Labs  Lab 10/29/23 0439 10/30/23 0521 11/03/23 0424 11/04/23 0419  WBC 21.3* 20.8* 21.6* 18.3*  HGB 8.9* 9.0* 9.8* 10.3*  HCT 27.0* 27.3* 30.5* 33.3*  MCV 86.3 85.6 88.2 89.5  PLT 355 370 416* 441*   Cardiac Enzymes: No results for input(s): CKTOTAL, CKMB, CKMBINDEX, TROPONINI in the last 168 hours. BNP: Invalid input(s): POCBNP CBG: Recent Labs  Lab 10/30/23 1110  GLUCAP 150*   D-Dimer No results for input(s): DDIMER in the last 72 hours. Hgb A1c No results for input(s): HGBA1C in the last 72 hours. Lipid Profile No results for input(s): CHOL, HDL, LDLCALC, TRIG, CHOLHDL, LDLDIRECT in the last 72 hours. Thyroid  function studies No results for input(s): TSH, T4TOTAL, T3FREE, THYROIDAB in the last 72 hours.  Invalid input(s): FREET3 Anemia work  up No results for input(s): VITAMINB12, FOLATE, FERRITIN, TIBC, IRON, RETICCTPCT in the last 72 hours. Urinalysis    Component Value Date/Time   COLORURINE AMBER (A) 10/26/2023 1158   APPEARANCEUR CLOUDY (A) 10/26/2023 1158   LABSPEC 1.025 10/26/2023 1158   PHURINE 5.0 10/26/2023 1158   GLUCOSEU NEGATIVE 10/26/2023 1158   HGBUR NEGATIVE 10/26/2023 1158   BILIRUBINUR NEGATIVE 10/26/2023 1158   KETONESUR NEGATIVE 10/26/2023 1158   PROTEINUR NEGATIVE 10/26/2023 1158   NITRITE NEGATIVE 10/26/2023 1158   LEUKOCYTESUR TRACE (A) 10/26/2023 1158   Sepsis Labs Recent Labs  Lab 10/29/23 0439 10/30/23 0521 11/03/23 0424 11/04/23 0419  WBC 21.3* 20.8* 21.6* 18.3*   Microbiology Recent Results (from the past 240 hours)  Culture, blood (routine x 2)     Status: None   Collection Time: 10/26/23  1:27 PM   Specimen: BLOOD  Result Value Ref Range Status   Specimen Description BLOOD BLOOD RIGHT ARM  Final   Special Requests BOTTLES DRAWN AEROBIC AND ANAEROBIC BCLV  Final   Culture   Final    NO GROWTH 5 DAYS Performed at [facility], [address], [place], [place] [address]    Report Status 10/31/2023 FINAL  Final  Culture, blood (routine x 2)     Status: None   Collection Time: 10/26/23  1:27 PM   Specimen: BLOOD  Result Value Ref Range Status   Specimen Description BLOOD BLOOD LEFT ARM  Final   Special Requests BOTTLES DRAWN AEROBIC AND ANAEROBIC BCLV  Final   Culture   Final    NO GROWTH 5 DAYS Performed at [facility], [address], [place], [place] [address]    Report Status 10/31/2023 FINAL  Final     Time coordinating discharge: 33 minutes  SIGNED:   Anthony CHRISTELLA Pouch, MD  [facility] 11/04/2023, 1:37 PM Pager   If 7PM-7AM, please contact night-coverage [email or web address]

## 2023-11-04 NOTE — Plan of Care (Signed)
  Problem: Activity: Goal: Risk for activity intolerance will decrease Outcome: Progressing   Problem: Nutrition: Goal: Adequate nutrition will be maintained Outcome: Progressing   Problem: Coping: Goal: Level of anxiety will decrease Outcome: Progressing   Problem: Pain Managment: Goal: General experience of comfort will improve and/or be controlled Outcome: Progressing

## 2023-11-04 NOTE — Progress Notes (Signed)
 Gave report to Anisa, LPN at [facility]. Anisa acknowledged understanding. Patient waiting for EMS transportation.
